# Patient Record
Sex: Male | Born: 1985 | Race: White | Hispanic: No | Marital: Single | Smoking: Current every day smoker
Health system: Southern US, Community
[De-identification: ages and names within clinical notes are randomized; demographics above are authoritative.]

## PROBLEM LIST (undated history)

## (undated) DIAGNOSIS — F1120 Opioid dependence, uncomplicated: Secondary | ICD-10-CM

## (undated) DIAGNOSIS — G8929 Other chronic pain: Secondary | ICD-10-CM

## (undated) DIAGNOSIS — R51 Headache: Secondary | ICD-10-CM

## (undated) DIAGNOSIS — F319 Bipolar disorder, unspecified: Secondary | ICD-10-CM

## (undated) DIAGNOSIS — F419 Anxiety disorder, unspecified: Secondary | ICD-10-CM

## (undated) DIAGNOSIS — R519 Headache, unspecified: Secondary | ICD-10-CM

## (undated) DIAGNOSIS — B182 Chronic viral hepatitis C: Secondary | ICD-10-CM

## (undated) DIAGNOSIS — Q631 Lobulated, fused and horseshoe kidney: Secondary | ICD-10-CM

## (undated) HISTORY — PX: TONSILLECTOMY: SUR1361

---

## 2010-04-16 ENCOUNTER — Emergency Department: Payer: Self-pay | Admitting: Emergency Medicine

## 2010-05-26 ENCOUNTER — Emergency Department: Payer: Self-pay | Admitting: Emergency Medicine

## 2010-08-04 ENCOUNTER — Inpatient Hospital Stay: Payer: Self-pay | Admitting: Psychiatry

## 2010-10-12 ENCOUNTER — Emergency Department: Payer: Self-pay | Admitting: Emergency Medicine

## 2010-11-14 ENCOUNTER — Emergency Department: Payer: Self-pay | Admitting: Emergency Medicine

## 2010-12-14 ENCOUNTER — Emergency Department: Payer: Self-pay | Admitting: Emergency Medicine

## 2010-12-21 ENCOUNTER — Emergency Department: Payer: Self-pay | Admitting: Emergency Medicine

## 2011-01-17 ENCOUNTER — Emergency Department: Payer: Self-pay | Admitting: Emergency Medicine

## 2011-03-29 ENCOUNTER — Emergency Department: Payer: Self-pay | Admitting: Emergency Medicine

## 2011-04-15 ENCOUNTER — Emergency Department: Payer: Self-pay | Admitting: Internal Medicine

## 2011-04-26 ENCOUNTER — Emergency Department: Payer: Self-pay | Admitting: Emergency Medicine

## 2011-04-30 ENCOUNTER — Emergency Department: Payer: Self-pay | Admitting: Emergency Medicine

## 2011-05-06 ENCOUNTER — Emergency Department: Payer: Self-pay | Admitting: Emergency Medicine

## 2011-05-08 ENCOUNTER — Emergency Department: Payer: Self-pay | Admitting: Emergency Medicine

## 2011-05-10 ENCOUNTER — Emergency Department: Payer: Self-pay | Admitting: Emergency Medicine

## 2011-05-11 ENCOUNTER — Emergency Department: Payer: Self-pay | Admitting: Emergency Medicine

## 2011-05-13 ENCOUNTER — Emergency Department: Payer: Self-pay | Admitting: Emergency Medicine

## 2011-05-19 ENCOUNTER — Inpatient Hospital Stay: Payer: Self-pay | Admitting: Psychiatry

## 2011-06-07 IMAGING — CT CT ABD-PELV W/ CM
1 of 3 series · 14 of 32 positions shown, 19 images · non-contrast
Comparison: none

REASON FOR EXAM: (1) pain; (2) pain
COMMENTS:

PROCEDURE:     CT  - CT ABDOMEN / PELVIS  W  - April 30, 2011  [DATE]
RESULT:     History: Pain.
Comparison study: Prior CT of 12/21/2010.

[Series 2: 3mm soft tissue · axial · 0.68mm/px · z∈[-540,-162]mm · 14 of 145 slices shown, 19 images]
[im 10/145  soft-tissue]
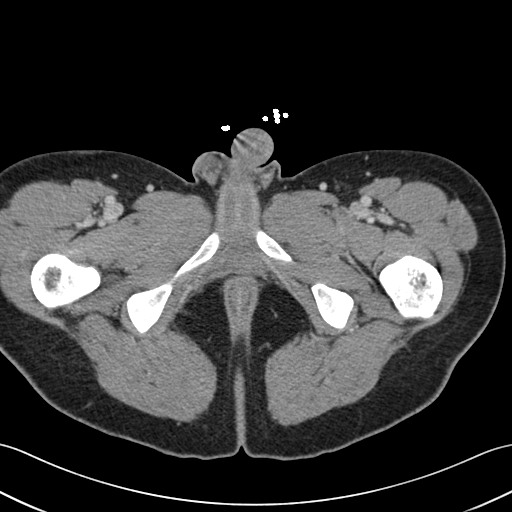
[im 10/145  bone]
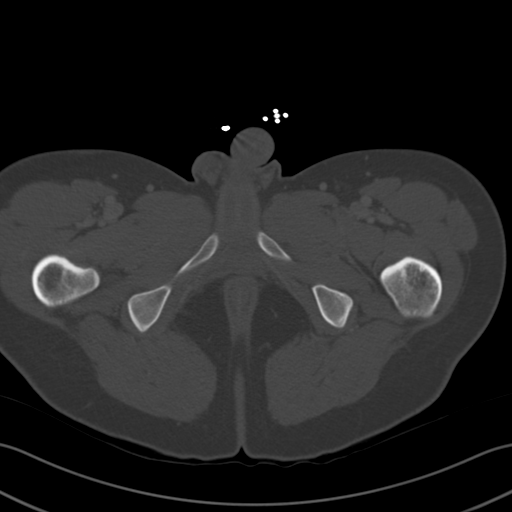
[im 19/145  soft-tissue]
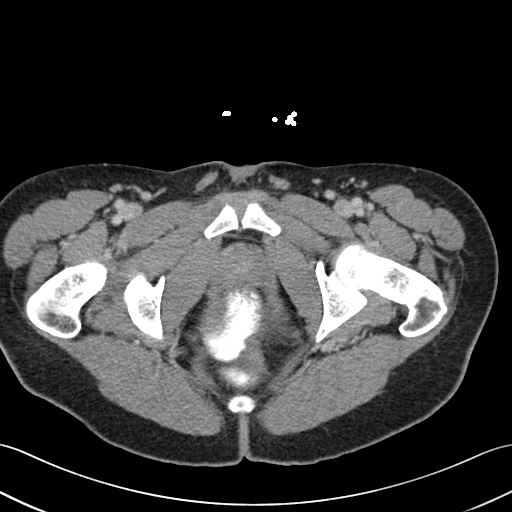
[im 28/145  soft-tissue]
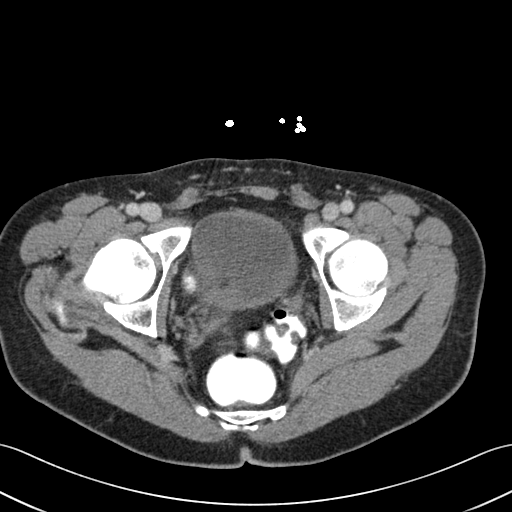
[im 46/145  soft-tissue]
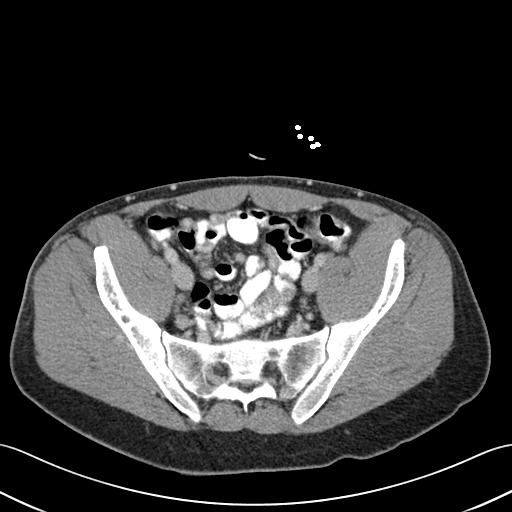
[im 55/145  soft-tissue]
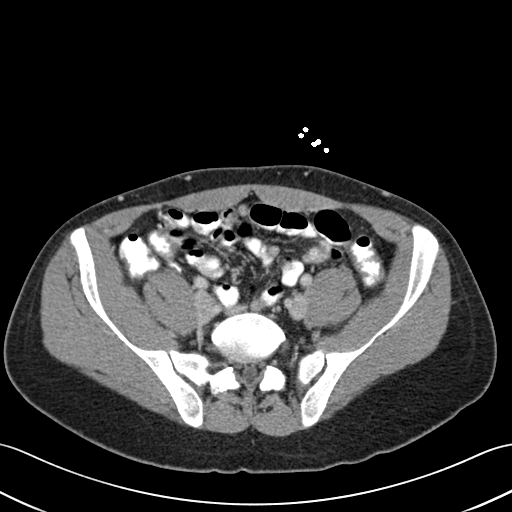
[im 64/145  soft-tissue]
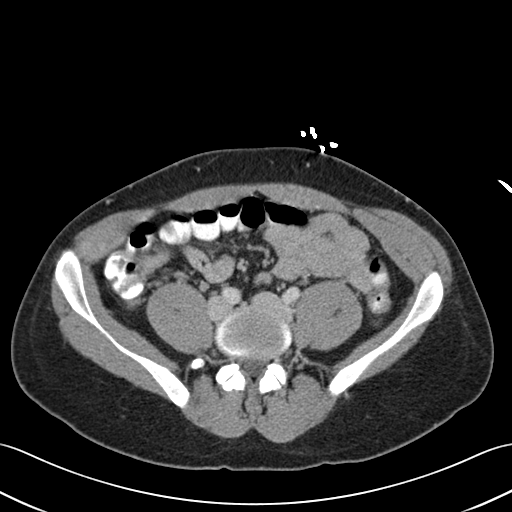
[im 73/145  soft-tissue]
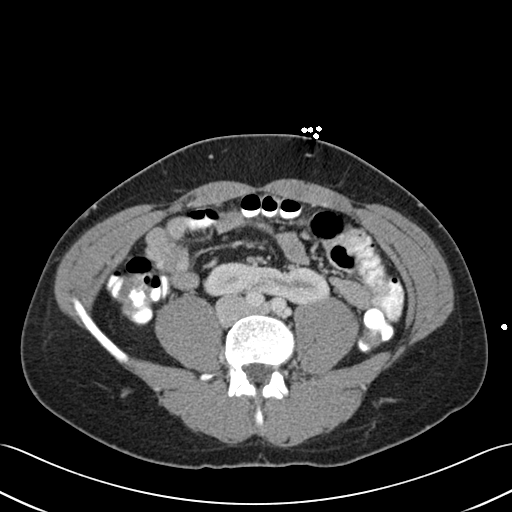
[im 82/145  soft-tissue]
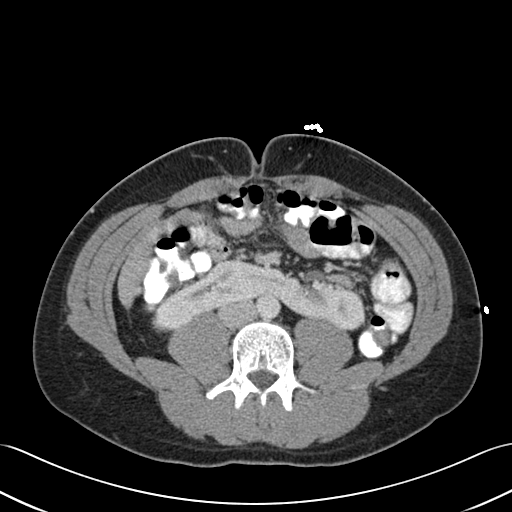
[im 91/145  soft-tissue]
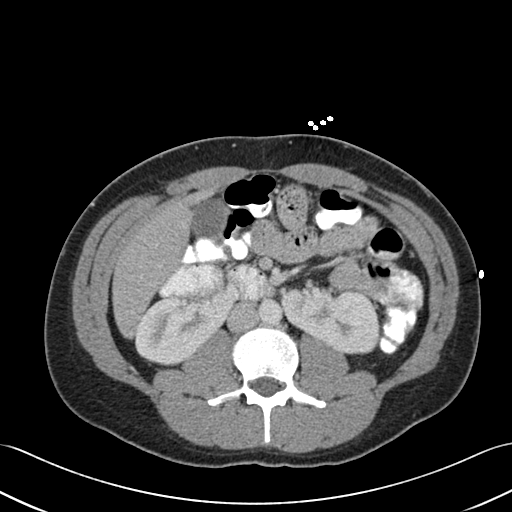
[im 91/145  bone]
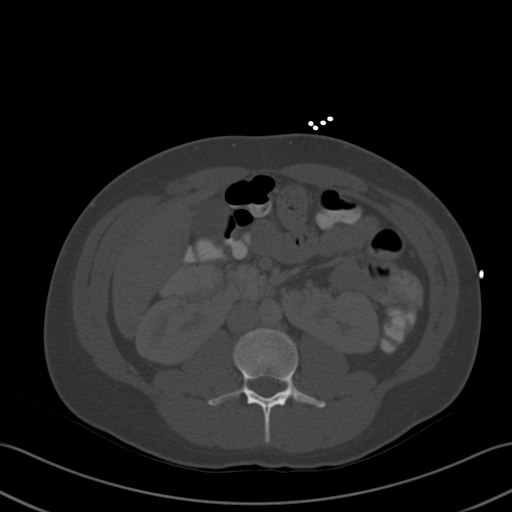
[im 100/145  soft-tissue]
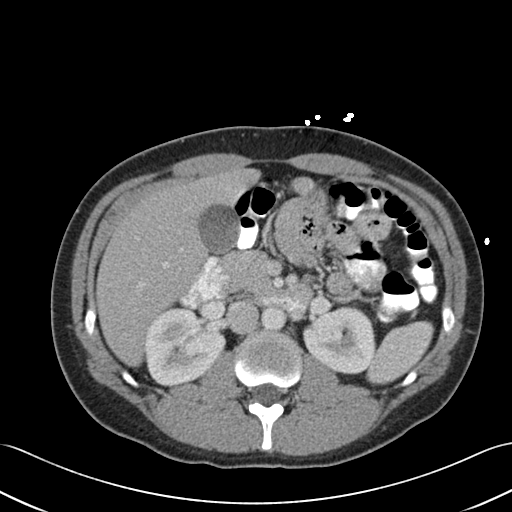
[im 109/145  lung]
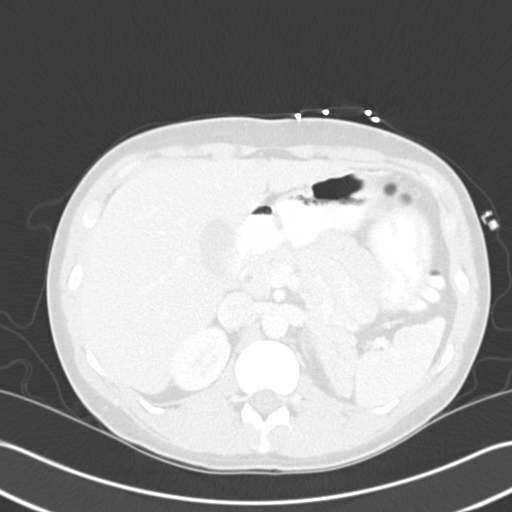
[im 118/145  soft-tissue]
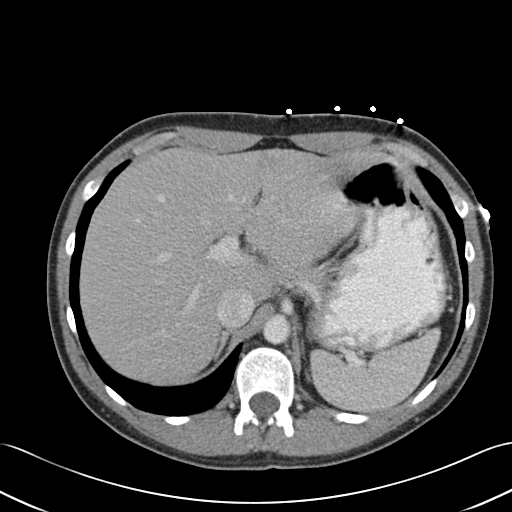
[im 118/145  lung]
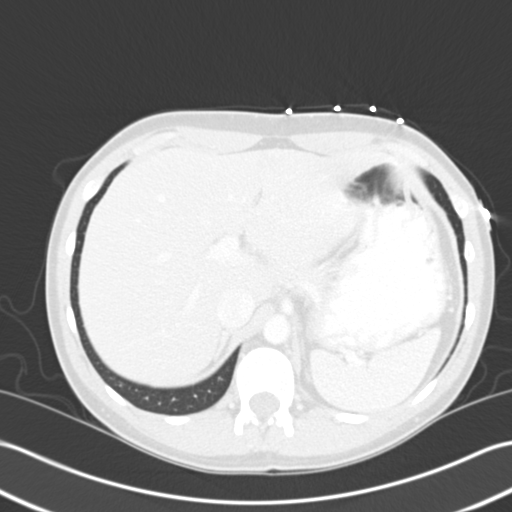
[im 127/145  soft-tissue]
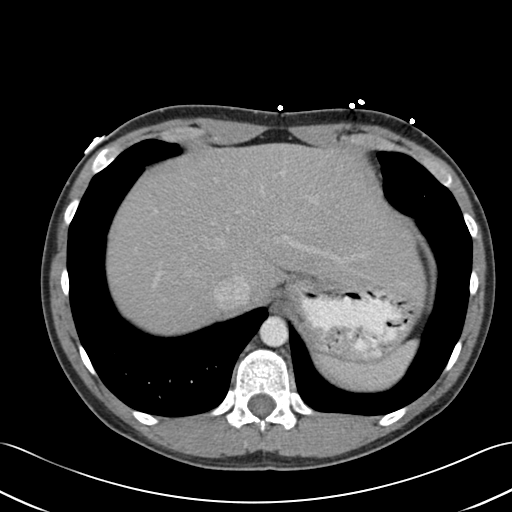
[im 127/145  lung]
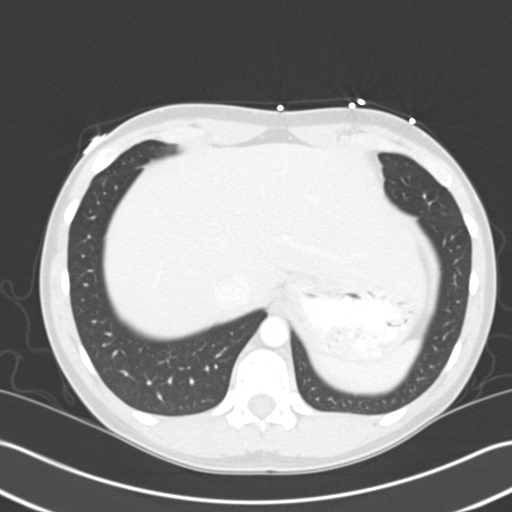
[im 136/145  soft-tissue]
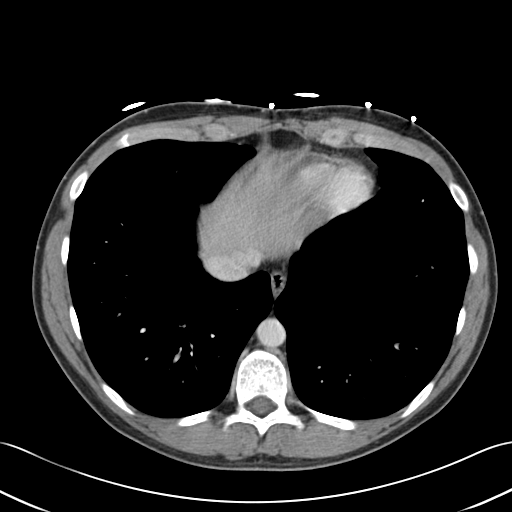
[im 136/145  lung]
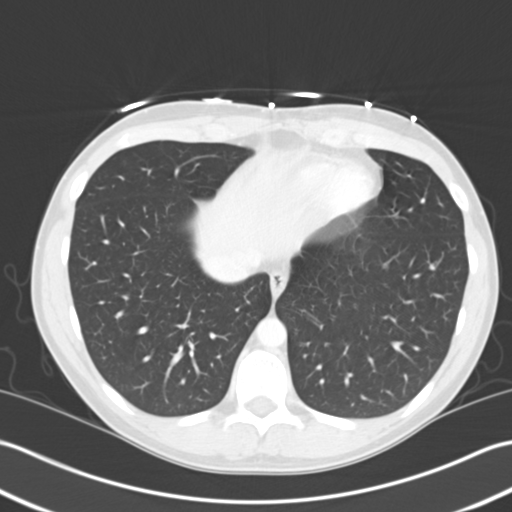

[14 of 32 positions shown; findings below may reference images not displayed]

FINDINGS: 
FINDINGS: Standard CT obtained with 100 cc of Ysovue-7PL. Liver normal.
Spleen is normal. Pancreas normal. Adrenals normal. Horseshoe kidney noted.
No focal renal lesions noted. No hydrocephalus. No bowel distention noted.
Small mesenteric lymph nodes are noted. These are stable. Appendix normal.
No evidence of bowel obstruction. Lung bases clear. The a tiny expansile
lesion in a right anterior rib is noted. Whole body bone scan is suggested
for further evaluation. No other bony abnormality identified. This is most
likely a benign bone lesion. Lung bases are clear. No free air.
IMPRESSION: Tiny expansile lesion in a right anterior rib. The patient
should be scheduled for bone scan as an outpatient for further evaluation.
This is most likely benign bone lesion.
2. No acute abnormality. Horseshoe kidney is noted.

## 2011-06-11 ENCOUNTER — Emergency Department: Payer: Self-pay | Admitting: Emergency Medicine

## 2011-06-12 ENCOUNTER — Emergency Department: Payer: Self-pay | Admitting: Emergency Medicine

## 2011-06-14 ENCOUNTER — Inpatient Hospital Stay: Payer: Self-pay | Admitting: Psychiatry

## 2011-06-17 DIAGNOSIS — R9431 Abnormal electrocardiogram [ECG] [EKG]: Secondary | ICD-10-CM

## 2011-06-17 DIAGNOSIS — R079 Chest pain, unspecified: Secondary | ICD-10-CM

## 2011-06-19 ENCOUNTER — Emergency Department: Payer: Self-pay | Admitting: Emergency Medicine

## 2011-06-21 ENCOUNTER — Emergency Department: Payer: Self-pay | Admitting: Emergency Medicine

## 2011-06-22 ENCOUNTER — Emergency Department: Payer: Self-pay | Admitting: Emergency Medicine

## 2011-07-19 IMAGING — CR DG CHEST 1V
1 series · 1 of 1 positions shown · non-contrast
Comparison: none

REASON FOR EXAM: left rib pain
COMMENTS:

PROCEDURE:     DXR - DXR CHEST 1 VIEWAP OR PA  - June 11, 2011  [DATE]
RESULT:     The cardiovascular structures are unremarkable. Chest is stable
from prior exam of 05/20/2011.

[view not recorded]
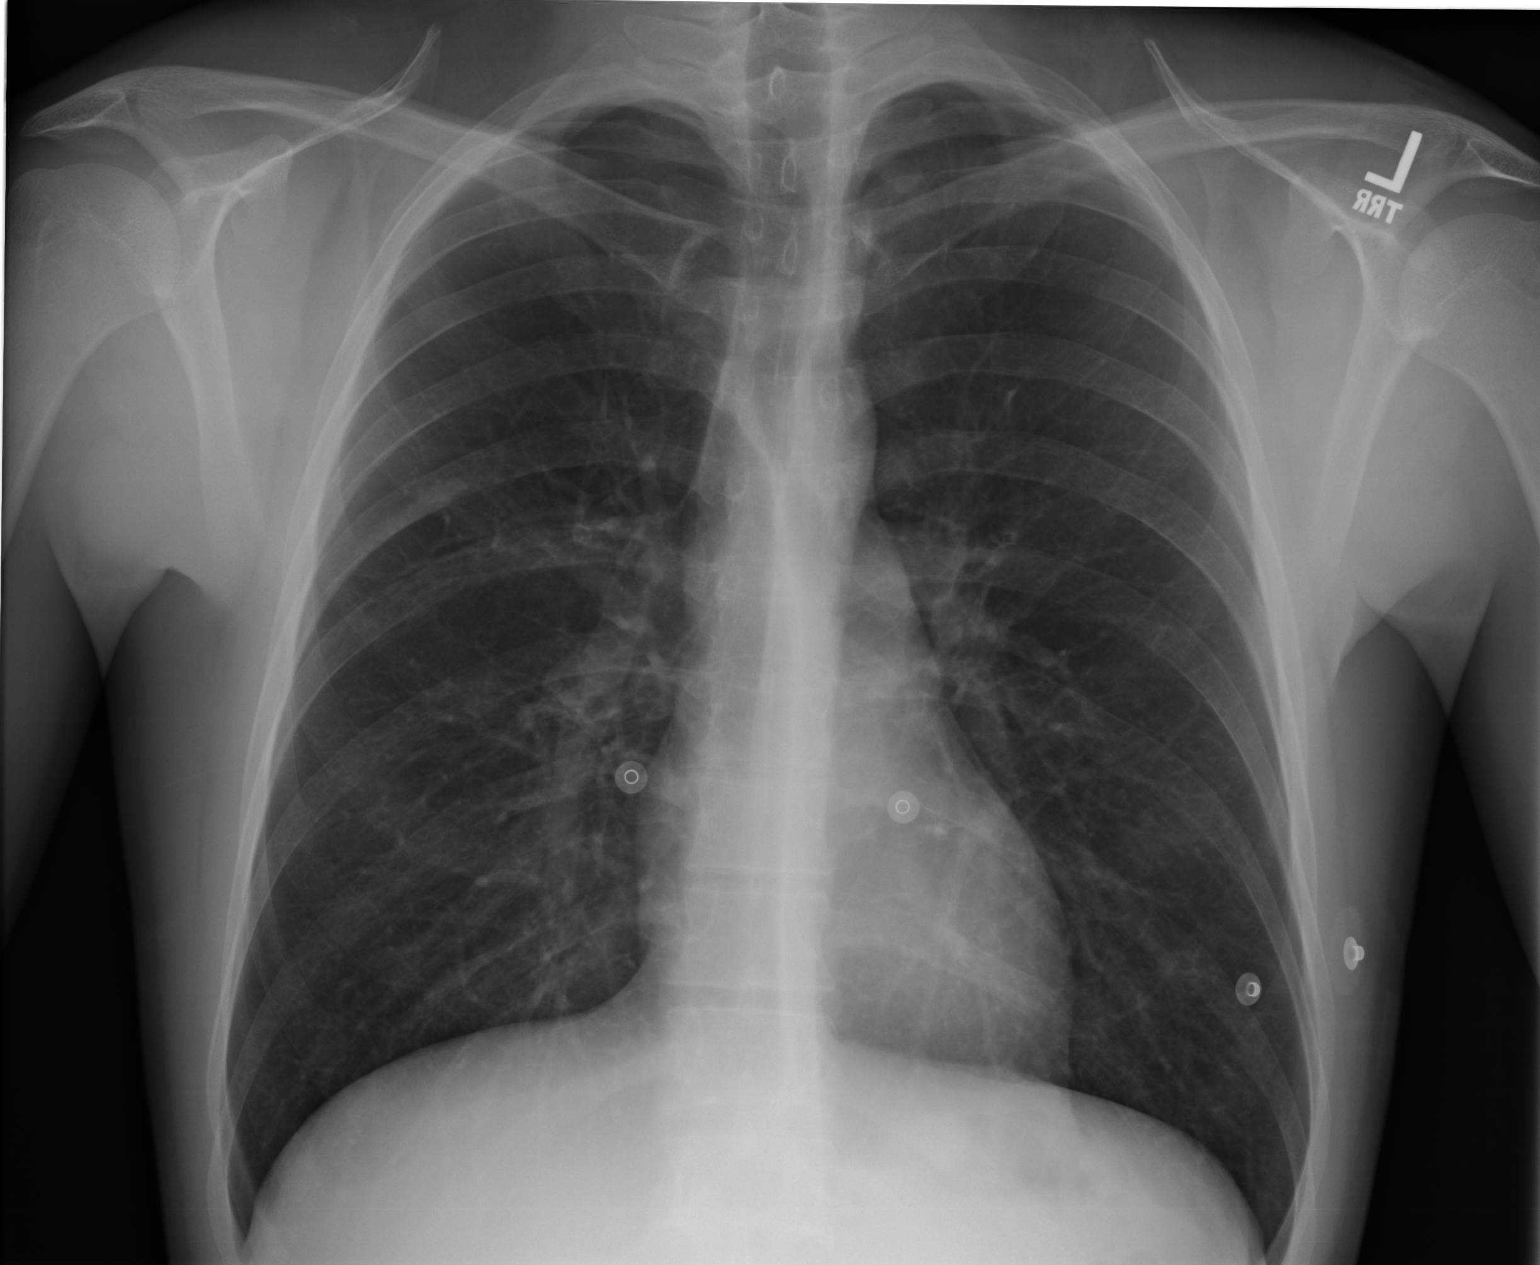

[1 of 1 positions shown; findings below may reference images not displayed]

IMPRESSION: 1. No acute abnormality.

## 2011-07-22 IMAGING — CR DG CHEST 2V
1 series · 2 of 2 positions shown · non-contrast
Comparison: none

REASON FOR EXAM: chest pain/cough
COMMENTS:

[Series 1: view not recorded · 0.17mm/px · 2 of 2 slices shown]
[im 1/2]
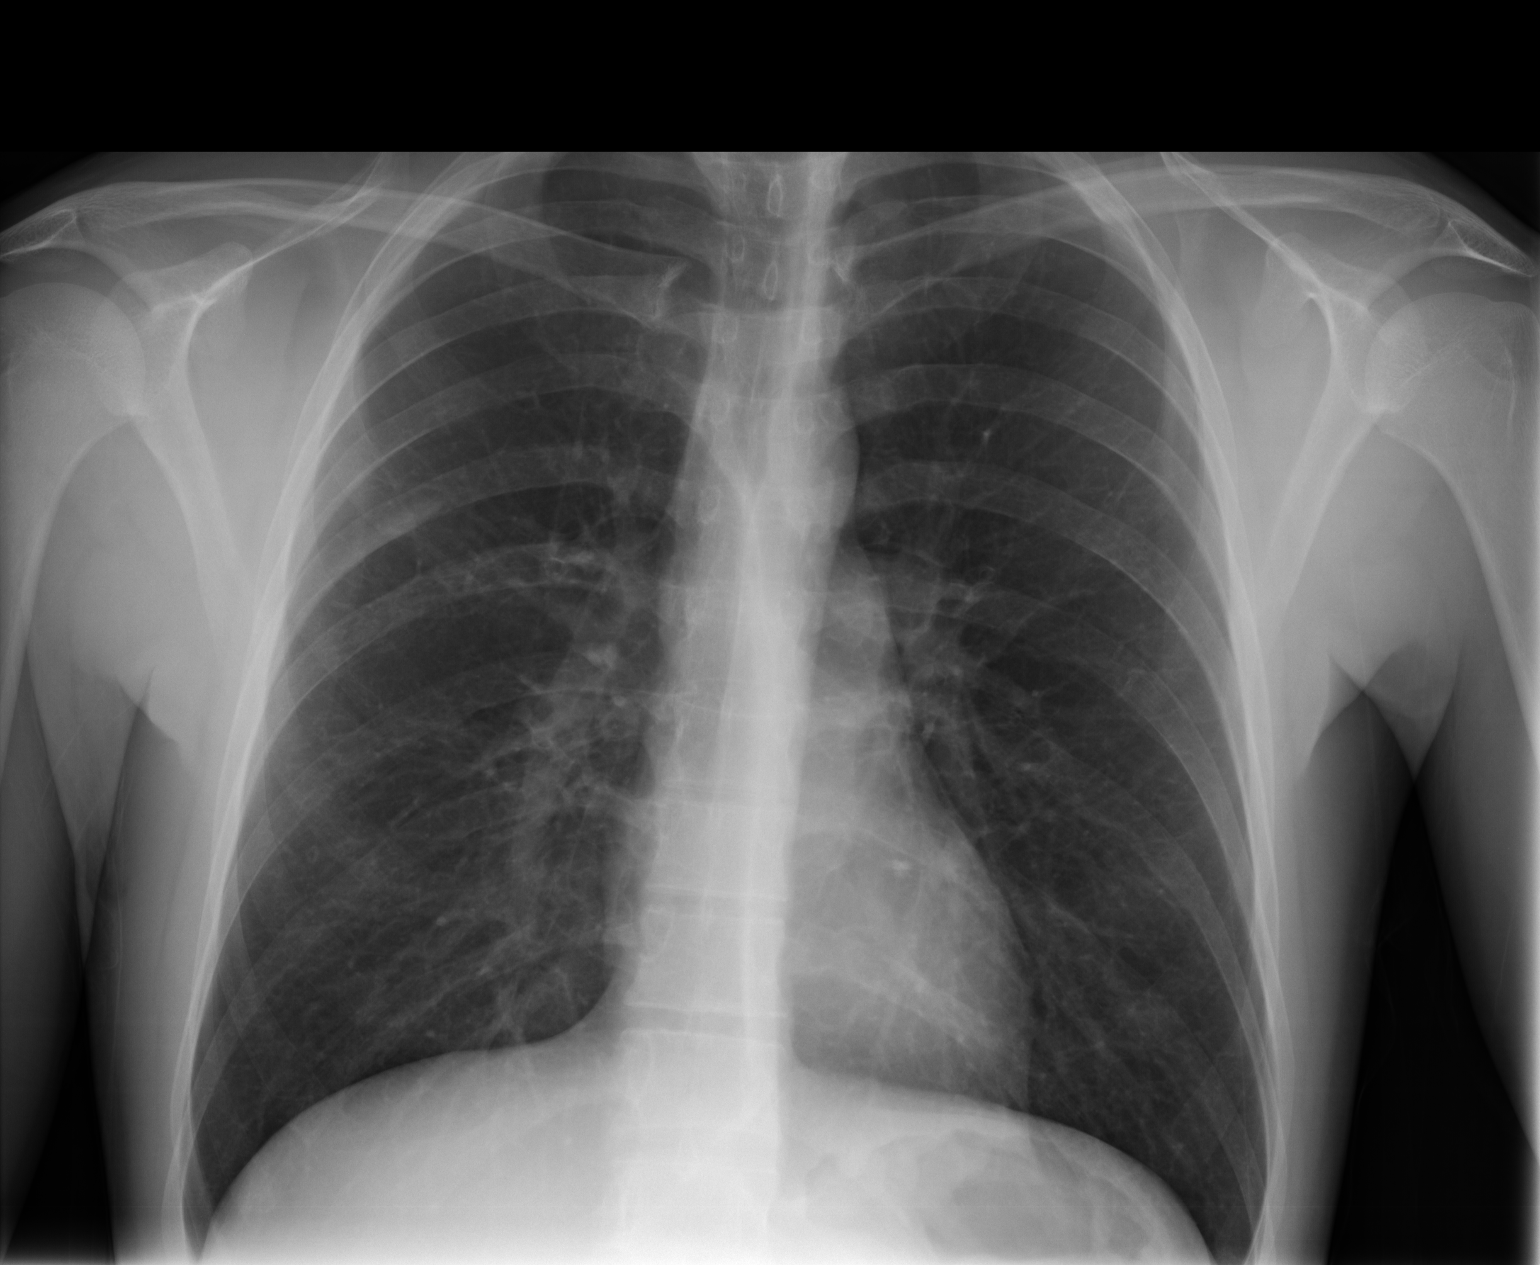
[im 2/2]
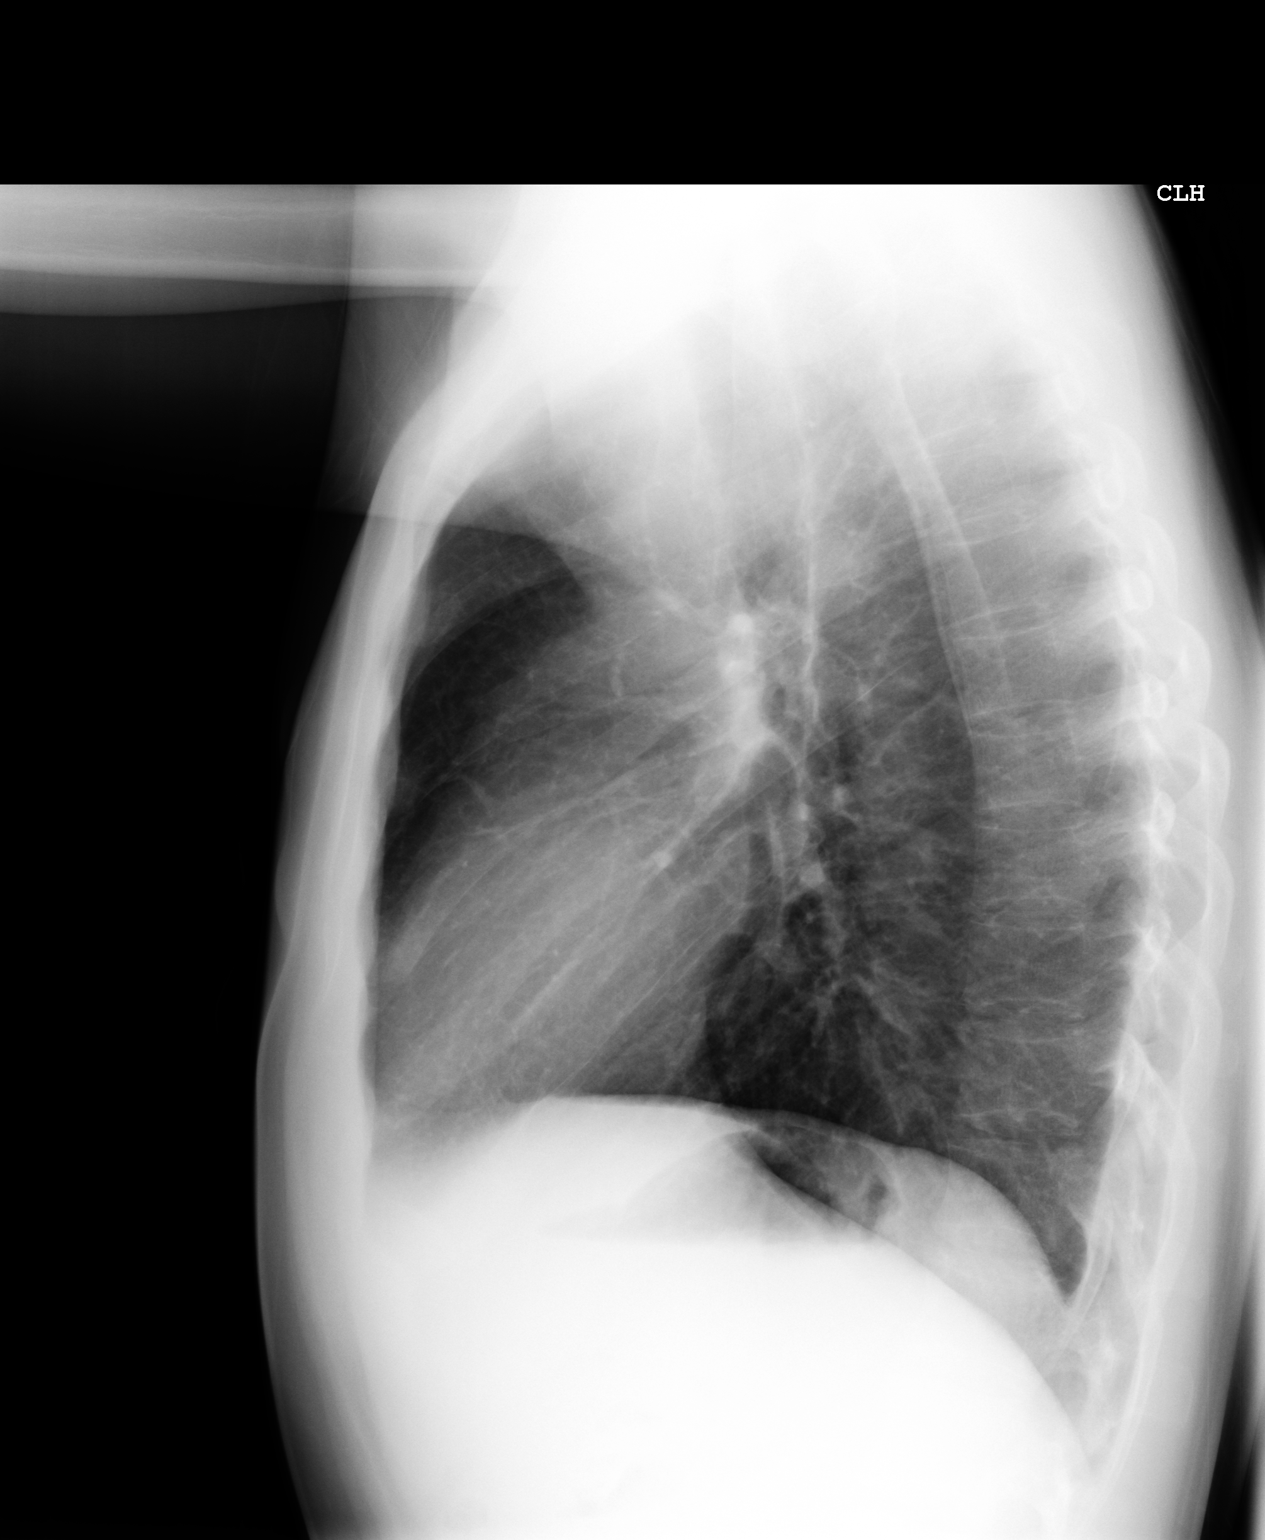

[2 of 2 positions shown; findings below may reference images not displayed]

PROCEDURE:     DXR - DXR CHEST PA (OR AP) AND LATERAL  - June 14, 2011  [DATE]

RESULT:     Comparison is made to a prior exam of 06/11/2011. The lung fields
are clear. No pneumonia, pneumothorax or pleural effusion is seen. Heart
size is normal. The chest appears mildly hyperinflated bilaterally. This can
be secondary to body habitus or to a history of reactive airway disease.
IMPRESSION: 1. The lung fields are clear.
2. Heart size is normal.
3. The chest appears bilaterally hyperinflated.

## 2011-08-08 ENCOUNTER — Emergency Department: Payer: Self-pay | Admitting: Emergency Medicine

## 2011-08-19 ENCOUNTER — Emergency Department: Payer: Self-pay | Admitting: *Deleted

## 2011-08-21 ENCOUNTER — Emergency Department: Payer: Self-pay | Admitting: Emergency Medicine

## 2011-10-23 ENCOUNTER — Inpatient Hospital Stay: Payer: Self-pay | Admitting: Psychiatry

## 2011-11-15 ENCOUNTER — Emergency Department: Payer: Self-pay | Admitting: Unknown Physician Specialty

## 2012-02-01 ENCOUNTER — Emergency Department: Payer: Self-pay | Admitting: Emergency Medicine

## 2012-02-01 LAB — CBC
HCT: 41.6 % (ref 40.0–52.0)
HGB: 14.4 g/dL (ref 13.0–18.0)
MCH: 31.4 pg (ref 26.0–34.0)
MCHC: 34.7 g/dL (ref 32.0–36.0)
MCV: 90 fL (ref 80–100)
RBC: 4.6 10*6/uL (ref 4.40–5.90)

## 2012-02-01 LAB — COMPREHENSIVE METABOLIC PANEL
Albumin: 4.1 g/dL (ref 3.4–5.0)
Anion Gap: 12 (ref 7–16)
Calcium, Total: 9.2 mg/dL (ref 8.5–10.1)
Co2: 23 mmol/L (ref 21–32)
EGFR (African American): >60
EGFR (Non-African Amer.): >60
Glucose: 104 mg/dL — ABNORMAL HIGH (ref 65–99)
Potassium: 3.3 mmol/L — ABNORMAL LOW (ref 3.5–5.1)
SGOT(AST): 78 U/L — ABNORMAL HIGH (ref 15–37)
Sodium: 140 mmol/L (ref 136–145)

## 2012-02-01 LAB — TSH: Thyroid Stimulating Horm: 2 u[IU]/mL

## 2012-02-01 LAB — ETHANOL: Ethanol %: <0.003 % (ref 0.000–0.080)

## 2012-02-01 LAB — DRUG SCREEN, URINE
Amphetamines, Ur Screen: NEGATIVE (ref ?–1000)
Benzodiazepine, Ur Scrn: NEGATIVE (ref ?–200)
Methadone, Ur Screen: NEGATIVE (ref ?–300)
Tricyclic, Ur Screen: NEGATIVE (ref ?–1000)

## 2012-02-01 LAB — ACETAMINOPHEN LEVEL: Acetaminophen: <2 ug/mL

## 2012-02-18 ENCOUNTER — Emergency Department: Payer: Self-pay | Admitting: Emergency Medicine

## 2012-02-19 ENCOUNTER — Emergency Department: Payer: Self-pay | Admitting: Emergency Medicine

## 2012-02-19 LAB — CBC WITH DIFFERENTIAL/PLATELET
Basophil #: 0.1 10*3/uL (ref 0.0–0.1)
Basophil #: 0 10*3/uL (ref 0.0–0.1)
Basophil %: 0.4 %
Eosinophil #: 0.5 10*3/uL (ref 0.0–0.7)
Eosinophil %: 3.2 %
HCT: 43.9 % (ref 40.0–52.0)
HGB: 15.2 g/dL (ref 13.0–18.0)
HGB: 14.6 g/dL (ref 13.0–18.0)
Lymphocyte #: 3.1 10*3/uL (ref 1.0–3.6)
Lymphocyte %: 29.9 %
Lymphocyte %: 22.2 %
MCH: 31.2 pg (ref 26.0–34.0)
MCHC: 33.4 g/dL (ref 32.0–36.0)
Monocyte #: 0.9 10*3/uL — ABNORMAL HIGH (ref 0.0–0.7)
Monocyte #: 1.1 10*3/uL — ABNORMAL HIGH (ref 0.0–0.7)
Neutrophil %: 57.8 %
Neutrophil %: 66.8 %
Platelet: 252 10*3/uL (ref 150–440)
Platelet: 270 10*3/uL (ref 150–440)
RBC: 4.76 10*6/uL (ref 4.40–5.90)
RDW: 16.8 % — ABNORMAL HIGH (ref 11.5–14.5)
RDW: 16.3 % — ABNORMAL HIGH (ref 11.5–14.5)
WBC: 14.1 10*3/uL — ABNORMAL HIGH (ref 3.8–10.6)

## 2012-02-19 LAB — COMPREHENSIVE METABOLIC PANEL
Albumin: 3 g/dL — ABNORMAL LOW (ref 3.4–5.0)
Anion Gap: 9 (ref 7–16)
BUN: 7 mg/dL (ref 7–18)
Bilirubin,Total: 1 mg/dL (ref 0.2–1.0)
Chloride: 107 mmol/L (ref 98–107)
Creatinine: 0.6 mg/dL (ref 0.60–1.30)
EGFR (African American): >60
EGFR (Non-African Amer.): >60
Glucose: 96 mg/dL (ref 65–99)
Potassium: 4.7 mmol/L (ref 3.5–5.1)
SGOT(AST): 98 U/L — ABNORMAL HIGH (ref 15–37)
Sodium: 141 mmol/L (ref 136–145)
Total Protein: 7 g/dL (ref 6.4–8.2)

## 2012-02-19 LAB — BASIC METABOLIC PANEL
Calcium, Total: 8.7 mg/dL (ref 8.5–10.1)
Chloride: 106 mmol/L (ref 98–107)
Co2: 26 mmol/L (ref 21–32)
EGFR (African American): >60
EGFR (Non-African Amer.): >60
Osmolality: 289 (ref 275–301)
Potassium: 4.1 mmol/L (ref 3.5–5.1)
Sodium: 144 mmol/L (ref 136–145)

## 2012-02-20 LAB — AMYLASE: Amylase: 26 U/L (ref 25–115)

## 2012-02-20 LAB — DRUG SCREEN, URINE
Amphetamines, Ur Screen: NEGATIVE (ref ?–1000)
MDMA (Ecstasy)Ur Screen: NEGATIVE (ref ?–500)
Methadone, Ur Screen: NEGATIVE (ref ?–300)
Opiate, Ur Screen: POSITIVE (ref ?–300)
Phencyclidine (PCP) Ur S: NEGATIVE (ref ?–25)
Tricyclic, Ur Screen: NEGATIVE (ref ?–1000)

## 2012-02-20 LAB — LIPASE, BLOOD: Lipase: 44 U/L — ABNORMAL LOW (ref 73–393)

## 2012-02-20 LAB — ETHANOL: Ethanol: <3 mg/dL

## 2012-02-24 LAB — CULTURE, BLOOD (SINGLE)

## 2012-02-25 LAB — CULTURE, BLOOD (SINGLE)

## 2012-04-15 ENCOUNTER — Emergency Department: Payer: Self-pay | Admitting: Emergency Medicine

## 2012-04-15 LAB — COMPREHENSIVE METABOLIC PANEL
Albumin: 4 g/dL (ref 3.4–5.0)
Alkaline Phosphatase: 96 U/L (ref 50–136)
Anion Gap: 9 (ref 7–16)
BUN: 6 mg/dL — ABNORMAL LOW (ref 7–18)
Bilirubin,Total: 0.2 mg/dL (ref 0.2–1.0)
Calcium, Total: 8.8 mg/dL (ref 8.5–10.1)
Chloride: 106 mmol/L (ref 98–107)
EGFR (African American): >60
EGFR (Non-African Amer.): >60
Glucose: 86 mg/dL (ref 65–99)

## 2012-04-15 LAB — CBC
HCT: 46.7 % (ref 40.0–52.0)
MCHC: 33.3 g/dL (ref 32.0–36.0)
MCV: 93 fL (ref 80–100)
Platelet: 340 10*3/uL (ref 150–440)
WBC: 6.7 10*3/uL (ref 3.8–10.6)

## 2012-04-16 LAB — DRUG SCREEN, URINE
Amphetamines, Ur Screen: NEGATIVE (ref ?–1000)
Cannabinoid 50 Ng, Ur ~~LOC~~: POSITIVE (ref ?–50)
Cocaine Metabolite,Ur ~~LOC~~: NEGATIVE (ref ?–300)
MDMA (Ecstasy)Ur Screen: NEGATIVE (ref ?–500)
Opiate, Ur Screen: NEGATIVE (ref ?–300)

## 2012-04-26 DIAGNOSIS — F1411 Cocaine abuse, in remission: Secondary | ICD-10-CM | POA: Insufficient documentation

## 2012-06-02 LAB — DRUG SCREEN, URINE
Benzodiazepine, Ur Scrn: POSITIVE (ref ?–200)
Cannabinoid 50 Ng, Ur ~~LOC~~: POSITIVE (ref ?–50)
Opiate, Ur Screen: NEGATIVE (ref ?–300)
Phencyclidine (PCP) Ur S: NEGATIVE (ref ?–25)

## 2012-06-02 LAB — ETHANOL
Ethanol %: 0.035 % (ref 0.000–0.080)
Ethanol: 35 mg/dL

## 2012-06-02 LAB — CBC
HGB: 15.3 g/dL (ref 13.0–18.0)
MCH: 30.6 pg (ref 26.0–34.0)
MCHC: 32.9 g/dL (ref 32.0–36.0)
MCV: 93 fL (ref 80–100)
Platelet: 290 10*3/uL (ref 150–440)
RBC: 4.98 10*6/uL (ref 4.40–5.90)
RDW: 13.7 % (ref 11.5–14.5)
WBC: 9.2 10*3/uL (ref 3.8–10.6)

## 2012-06-02 LAB — URINALYSIS, COMPLETE
Bacteria: NONE SEEN
Leukocyte Esterase: NEGATIVE
Nitrite: NEGATIVE
Protein: NEGATIVE
RBC,UR: NONE SEEN /HPF (ref 0–5)
Specific Gravity: 1.006 (ref 1.003–1.030)

## 2012-06-02 LAB — COMPREHENSIVE METABOLIC PANEL
Anion Gap: 9 (ref 7–16)
BUN: 5 mg/dL — ABNORMAL LOW (ref 7–18)
Bilirubin,Total: 0.2 mg/dL (ref 0.2–1.0)
Chloride: 104 mmol/L (ref 98–107)
Creatinine: 0.96 mg/dL (ref 0.60–1.30)
EGFR (Non-African Amer.): >60
Osmolality: 272 (ref 275–301)
SGPT (ALT): 22 U/L

## 2012-06-02 LAB — TSH: Thyroid Stimulating Horm: 1.39 u[IU]/mL

## 2012-06-03 ENCOUNTER — Inpatient Hospital Stay: Payer: Self-pay | Admitting: Psychiatry

## 2012-10-26 ENCOUNTER — Emergency Department: Payer: Self-pay | Admitting: Emergency Medicine

## 2012-10-26 LAB — CBC
MCH: 31 pg (ref 26.0–34.0)
MCHC: 33.8 g/dL (ref 32.0–36.0)
MCV: 92 fL (ref 80–100)
Platelet: 225 10*3/uL (ref 150–440)
RBC: 4.39 10*6/uL — ABNORMAL LOW (ref 4.40–5.90)
RDW: 14.3 % (ref 11.5–14.5)
WBC: 5.6 10*3/uL (ref 3.8–10.6)

## 2012-10-26 LAB — DRUG SCREEN, URINE
Benzodiazepine, Ur Scrn: POSITIVE (ref ?–200)
Cannabinoid 50 Ng, Ur ~~LOC~~: NEGATIVE (ref ?–50)
Cocaine Metabolite,Ur ~~LOC~~: POSITIVE (ref ?–300)
MDMA (Ecstasy)Ur Screen: NEGATIVE (ref ?–500)
Methadone, Ur Screen: NEGATIVE (ref ?–300)
Phencyclidine (PCP) Ur S: NEGATIVE (ref ?–25)

## 2012-10-26 LAB — URINALYSIS, COMPLETE
Bacteria: NONE SEEN
Blood: NEGATIVE
Glucose,UR: 50 mg/dL (ref 0–75)
Leukocyte Esterase: NEGATIVE
Nitrite: NEGATIVE
RBC,UR: 1 /HPF (ref 0–5)
Squamous Epithelial: NONE SEEN
WBC UR: 3 /HPF (ref 0–5)

## 2012-10-27 LAB — COMPREHENSIVE METABOLIC PANEL
Anion Gap: 9 (ref 7–16)
BUN: 11 mg/dL (ref 7–18)
Bilirubin,Total: 0.4 mg/dL (ref 0.2–1.0)
Calcium, Total: 8.3 mg/dL — ABNORMAL LOW (ref 8.5–10.1)
Chloride: 110 mmol/L — ABNORMAL HIGH (ref 98–107)
Creatinine: 0.62 mg/dL (ref 0.60–1.30)
EGFR (African American): >60
Glucose: 87 mg/dL (ref 65–99)
Potassium: 3.6 mmol/L (ref 3.5–5.1)
Sodium: 144 mmol/L (ref 136–145)

## 2012-10-27 LAB — TSH: Thyroid Stimulating Horm: 0.29 u[IU]/mL — ABNORMAL LOW

## 2012-10-27 LAB — ETHANOL: Ethanol: <3 mg/dL

## 2012-11-19 ENCOUNTER — Emergency Department: Payer: Self-pay | Admitting: Emergency Medicine

## 2012-11-19 LAB — COMPREHENSIVE METABOLIC PANEL
Alkaline Phosphatase: 98 U/L (ref 50–136)
Bilirubin,Total: 0.3 mg/dL (ref 0.2–1.0)
Co2: 27 mmol/L (ref 21–32)
Creatinine: 0.65 mg/dL (ref 0.60–1.30)
EGFR (African American): >60
EGFR (Non-African Amer.): >60
Glucose: 83 mg/dL (ref 65–99)
Osmolality: 274 (ref 275–301)
SGOT(AST): 25 U/L (ref 15–37)
SGPT (ALT): 28 U/L (ref 12–78)
Sodium: 139 mmol/L (ref 136–145)

## 2012-11-19 LAB — CBC
HGB: 16.3 g/dL (ref 13.0–18.0)
MCH: 32.1 pg (ref 26.0–34.0)
MCHC: 35.4 g/dL (ref 32.0–36.0)

## 2012-11-19 LAB — SALICYLATE LEVEL: Salicylates, Serum: 8.4 mg/dL — ABNORMAL HIGH

## 2012-11-19 LAB — ETHANOL
Ethanol %: <0.003 % (ref 0.000–0.080)
Ethanol: <3 mg/dL

## 2012-11-20 LAB — DRUG SCREEN, URINE
Amphetamines, Ur Screen: NEGATIVE (ref ?–1000)
Cocaine Metabolite,Ur ~~LOC~~: POSITIVE (ref ?–300)
Methadone, Ur Screen: NEGATIVE (ref ?–300)
Opiate, Ur Screen: NEGATIVE (ref ?–300)
Phencyclidine (PCP) Ur S: NEGATIVE (ref ?–25)
Tricyclic, Ur Screen: NEGATIVE (ref ?–1000)

## 2012-11-20 LAB — URINALYSIS, COMPLETE
Bilirubin,UR: NEGATIVE
Blood: NEGATIVE
Glucose,UR: NEGATIVE mg/dL (ref 0–75)
Leukocyte Esterase: NEGATIVE
Nitrite: NEGATIVE
RBC,UR: <1 /HPF (ref 0–5)

## 2013-06-27 ENCOUNTER — Inpatient Hospital Stay: Payer: Self-pay | Admitting: Internal Medicine

## 2013-06-27 LAB — COMPREHENSIVE METABOLIC PANEL
Alkaline Phosphatase: 96 U/L (ref 50–136)
Anion Gap: 8 (ref 7–16)
Bilirubin,Total: 0.3 mg/dL (ref 0.2–1.0)
Calcium, Total: 9.1 mg/dL (ref 8.5–10.1)
Chloride: 104 mmol/L (ref 98–107)
Co2: 25 mmol/L (ref 21–32)
Creatinine: 0.93 mg/dL (ref 0.60–1.30)
EGFR (Non-African Amer.): >60
Glucose: 87 mg/dL (ref 65–99)
Osmolality: 273 (ref 275–301)
Potassium: 3.5 mmol/L (ref 3.5–5.1)
SGOT(AST): 35 U/L (ref 15–37)
Sodium: 137 mmol/L (ref 136–145)
Total Protein: 8.4 g/dL — ABNORMAL HIGH (ref 6.4–8.2)

## 2013-06-27 LAB — CBC WITH DIFFERENTIAL/PLATELET
Basophil #: 0.1 10*3/uL (ref 0.0–0.1)
Eosinophil %: 1.8 %
HCT: 44.1 % (ref 40.0–52.0)
HGB: 14.7 g/dL (ref 13.0–18.0)
MCHC: 33.3 g/dL (ref 32.0–36.0)
MCV: 92 fL (ref 80–100)
Monocyte #: 1.3 x10 3/mm — ABNORMAL HIGH (ref 0.2–1.0)
Monocyte %: 8 %
Neutrophil #: 11.4 10*3/uL — ABNORMAL HIGH (ref 1.4–6.5)
Neutrophil %: 71.4 %
RDW: 14.1 % (ref 11.5–14.5)
WBC: 16 10*3/uL — ABNORMAL HIGH (ref 3.8–10.6)

## 2013-07-04 LAB — WOUND CULTURE

## 2013-09-27 ENCOUNTER — Emergency Department: Payer: Self-pay | Admitting: Emergency Medicine

## 2014-05-07 ENCOUNTER — Emergency Department: Payer: Self-pay | Admitting: Internal Medicine

## 2014-05-07 LAB — URINALYSIS, COMPLETE
Bacteria: NONE SEEN
Bilirubin,UR: NEGATIVE
Blood: NEGATIVE
Glucose,UR: NEGATIVE mg/dL (ref 0–75)
Ketone: NEGATIVE
Leukocyte Esterase: NEGATIVE
Nitrite: NEGATIVE
PROTEIN: NEGATIVE
Ph: 5 (ref 4.5–8.0)
RBC,UR: NONE SEEN /HPF (ref 0–5)
SQUAMOUS EPITHELIAL: NONE SEEN
Specific Gravity: 1.019 (ref 1.003–1.030)

## 2014-06-22 ENCOUNTER — Emergency Department: Payer: Self-pay | Admitting: Emergency Medicine

## 2014-08-15 ENCOUNTER — Emergency Department: Payer: Self-pay | Admitting: Emergency Medicine

## 2014-08-15 LAB — URINALYSIS, COMPLETE
BLOOD: NEGATIVE
Bacteria: NONE SEEN
Bilirubin,UR: NEGATIVE
GLUCOSE, UR: NEGATIVE mg/dL (ref 0–75)
KETONE: NEGATIVE
Leukocyte Esterase: NEGATIVE
NITRITE: NEGATIVE
PH: 5 (ref 4.5–8.0)
PROTEIN: NEGATIVE
Specific Gravity: 1.017 (ref 1.003–1.030)
Squamous Epithelial: <1

## 2014-08-15 LAB — DRUG SCREEN, URINE
AMPHETAMINES, UR SCREEN: NEGATIVE (ref ?–1000)
BENZODIAZEPINE, UR SCRN: NEGATIVE (ref ?–200)
Barbiturates, Ur Screen: NEGATIVE (ref ?–200)
CANNABINOID 50 NG, UR ~~LOC~~: POSITIVE (ref ?–50)
COCAINE METABOLITE, UR ~~LOC~~: NEGATIVE (ref ?–300)
MDMA (Ecstasy)Ur Screen: NEGATIVE (ref ?–500)
Methadone, Ur Screen: NEGATIVE (ref ?–300)
Opiate, Ur Screen: NEGATIVE (ref ?–300)
PHENCYCLIDINE (PCP) UR S: NEGATIVE (ref ?–25)
Tricyclic, Ur Screen: NEGATIVE (ref ?–1000)

## 2014-08-15 LAB — GC/CHLAMYDIA PROBE AMP

## 2015-01-01 ENCOUNTER — Emergency Department: Payer: Self-pay | Admitting: Student

## 2015-04-08 NOTE — Consult Note (Signed)
PATIENT NAME:  Nathan [name], Nathan [name] MR#:  [id number] DATE OF BIRTH:  1986/01/22  DATE OF CONSULTATION:  11/20/2012  REFERRING PHYSICIAN:  Dr. Janalyn Harderavid Kaminski  CONSULTING PHYSICIAN:  Suprina Mandeville B. Bexleigh Theriault, MD  REASON FOR CONSULTATION: To evaluate a patient after overdose.   IDENTIFYING DATA: [name] is a 29 year old male with history of mood instability and substance abuse.   CHIEF COMPLAINT: "I need help."   HISTORY OF PRESENT ILLNESS: [name] is well known to us. He has been hospitalized [facility]. He also frequency returns to Emergency Room following drug binges and suicidal threats. He has been through several treatment programs including court ordered program. He struggles with substance abuse and has not been able to maintain sobriety. He was brought to the Emergency Room after medication overdose while intoxicated. Reportedly while with his brother he took a handful of pills. He believed that the pills including lithium among others. Fortunately, his lithium level was low and is unlikely that the patient overdosed on lithium. He is not currently prescribed lithium and had it from his old supply. The patient presents often with a similar scenario when he uses drugs in the company of his brother or father, oftentimes becomes depressed and suicidal. He has been a patient of [facility] at [facility] who prescribes just Seroquel at night along with clonazepam 1 mg at night as well. The patient reports many symptoms of depression with poor sleep, decreased appetite, anhedonia, crying, feeling of guilt, hopelessness, worthlessness, some weight loss, poor energy, concentration, and passing suicidal thoughts. He realizes that substance abuse has been out of control and is asking to be referred to a residential substance abuse treatment program.   PAST PSYCHIATRIC HISTORY: The patient has substance abuse problem and mood instability. He has been treated with multiple  medications. He did well on Zyprexa while in [place]. He has been on lithium and Depakote and multiple antipsychotics and antidepressants. He now believes that substance abuse is his primary problem and agrees with Dr. Mervyn SkeetersA that low dose Seroquel for sleep and anxiety is the best solution to his problems. There is a history of treatment noncompliance. He has been addicted to multiple substances and does not discriminate. He reports that lately he has been doing a lot of heroin, last dose two days ago, so far there are no withdrawal symptoms.   FAMILY PSYCHIATRIC HISTORY: There are multiple family members including his brother and father with mental illness and substance abuse problems. No history of completed suicides in the family.   PAST MEDICAL HISTORY: Hepatitis C.    ALLERGIES: Aspirin, Geodon, ibuprofen, Naprosyn.   MEDICATIONS ON ADMISSION:  1. Seroquel 100 mg at bedtime. 2. Clonazepam 1 mg at bedtime.   SOCIAL HISTORY: He is originally from [place]. His parents are divorced. He lives with his mother who is very supportive, nice and kind. He hangs out with his brother and father who oftentimes use substances. He has been hospitalized many times for mood instability as well as substance abuse. He has two children, has never been married. He is unable to pay child support.   REVIEW OF SYSTEMS: CONSTITUTIONAL: No fevers or chills. Positive for some weight loss. EYES: No double or blurred vision. ENT: No hearing loss. RESPIRATORY: No shortness of breath or cough. CARDIOVASCULAR: No chest pain or orthopnea. GASTROINTESTINAL: No abdominal pain, nausea, vomiting, or diarrhea. GENITOURINARY: No incontinence or frequency. ENDOCRINE: No heat or cold intolerance. LYMPHATIC: No anemia or easy bruising.  INTEGUMENTARY: No acne or rash. MUSCULOSKELETAL: No muscle or joint pain. NEUROLOGIC: No tingling or weakness. PSYCHIATRIC: See history of present illness for details.   PHYSICAL EXAMINATION:  VITAL  SIGNS: Blood pressure 105/62, pulse 61, respirations 20, temperature 98.3.   GENERAL: This is a slender young male in no acute distress. The rest of the physical examination is deferred to his primary attending.   LABORATORY, DIAGNOSTIC AND RADIOLOGICAL DATA: Chemistries are within normal limits. Blood alcohol level zero. LFTs within normal limits. Lithium less than 0.1. Urine drug screen positive for benzodiazepines, cocaine, cannabinoids and MDMA. CBC within normal limits. Urinalysis is not suggestive of urinary tract infection. Serum acetaminophen less than 2. Serum salicylates 8.4. EKG: Normal sinus rhythm, normal EKG.   MENTAL STATUS EXAMINATION: The patient is alert and oriented to person, place, time, and situation. He is unkept, looks dirty. He recognizes me from previous encounters. He is pleasant, polite, and cooperative. He maintains good eye contact. His speech is soft. Mood is depressed with flat affect. Thought processing is logical and goal oriented. He denies suicidal or homicidal ideation but was admitted after a suicide attempt by pill overdose. There are no delusions or paranoia. No auditory or visual hallucinations. His cognition is grossly intact. He registers three out of three and recalls three out of three objects after five minutes. He can spell world forwards and backwards. He knows current president. His insight and judgment are questionable.   SUICIDE RISK ASSESSMENT: This is a patient with long history of mood instability, substance abuse and multiple suicide attempts/gestures who came to the hospital after another suicide attempt by overdose in the context of relapse on substances.   DIAGNOSES:  AXIS I:  1. Mood disorder, not otherwise specified.  2. Polysubstance dependence.   AXIS II: Deferred.   AXIS III: Hepatitis C.   AXIS IV: Mental illness, substance abuse, employment, financial, access to care.   AXIS V: GAF 25.   PLAN:  1. The patient was referred to  [facility] rehab facility and will await bed opening in the Emergency Room which will speed up the process.  2. I will restart Seroquel 100 mg at night as prescribed by Dr. Mervyn Skeeters at [facility].  3. The patient does not need alcohol detox.  4. He may need symptomatic treatment for opiate withdrawal. His last dose of heroin was two days ago.      5. I will follow along.  ____________________________ Ellin Goodie. Jennet Maduro, MD jbp:cms D: 11/21/2012 16:45:55 ET T: 11/21/2012 17:13:49 ET JOB#: [id number]  cc: Areej Tayler B. Jennet Maduro, MD, <Dictator> Shari Prows MD ELECTRONICALLY SIGNED 12/02/2012 2:16

## 2015-04-08 NOTE — Consult Note (Signed)
Brief Consult Note: Diagnosis: Mood disorder NOS, Polysubstance dependence.   Patient was seen by consultant.   Consult note dictated.   Recommend further assessment or treatment.   Orders entered.   Comments: [name] has a h/o mood instability and substance dependence. He was brought to ER after an overdose on a handful of medic ations while intoxicated. He still feels hopeless and wants substance abuse treatyment.   PLAN: 1. The patient is referred to [facility] rehab facility.   2. Will restart Sertoquel as prescribed by Dr[facility]A at [facility].   3. He does not need alcohol detox.  4. Last dose heroine was 2 days ago. He is not in withdrawal yet.   5. I will follow along.  Electronic Signatures: Kristine LineaPucilowska, Bubba Vanbenschoten (MD)  (Signed 02-Dec-13 14:16)  Authored: Brief Consult Note   Last Updated: 02-Dec-13 14:16 by Kristine LineaPucilowska, Benino Korinek (MD)

## 2015-04-13 NOTE — H&P (Signed)
PATIENT NAME:  Nathan [name], CICERO MR#:  [id number] DATE OF BIRTH:  06-04-1986  DATE OF ADMISSION:  06/03/2012  IDENTIFYING INFORMATION: The patient is a 29 year old white male not employed, last worked when he was 29 years old. The patient is single and never married and lives with his mother who is 42 years old. The patient comes back for readmission to [facility] after he was discharged on 10/25/2011 when he was admitted for being drunk and shooting up heroin.   CHIEF COMPLAINT: "I started drinking real heavy badly and then I told my brother I did not want to live. He called the sheriff and they brought me here. You see, I was drunk; I did not mean it. I will be going to [facility] in [place], [place] on the 27th of this month and I'm looking forward to getting help from them".  HISTORY OF PRESENT ILLNESS: According to information obtained from the chart, the patient was discharged from [facility] on 10/25/2011 after being detoxed from alcohol.  He was discharged on the following medications: Tegretol 200 mg p.o. b.i.d. and Seroquel 25 mg p.o. at bedtime. The patient reports that he's being followed at [facility] and that he's being followed by the doctor there and last appointment was on 05/31/2012 and next appointment is coming up on 06/15/2012. Prior to him going to the [facility] in [place], [place] his medications were changed as follows: 1. Lithium carbonate 300 mg p.o. q.a.m. and 600 mg at bedtime. 2. Seroquel 200 mg p.o. at bedtime. 3. Cogentin 2 mg at bedtime.  PAST PSYCHIATRIC HISTORY: The patient has had multiple inpatient hospitalizations on Psychiatry according to history obtained from the chart. He was diagnosed with bipolar disorder in [place] and was treated with Zyprexa with good results. He moved to [facility] and continued Zyprexa. Currently he is on the medications listed above. The patient currently reports that he has been drinking alcohol  at a rate of quarter of a fifth every other day. His brother pays for it or his father gives him money for the same. He has a long history of alcohol drinking and problems with the same and realizes that he has to go to [facility] in [place]. The patient reported that he never tried to kill himself and never had a suicide attempt but just talks about it when he's drunk and does not mean it.   FAMILY HISTORY OF MENTAL ILLNESS: Father has schizophrenia and substance abuse. Brother is a substance abuser, drinks alcohol. The patient reported "my family members have mental illness; my mother, my father, everybody and my brother". No known history of suicides in the family.   FAMILY HISTORY: Raised by both parents. Father was a Curator. Father is living and is 67 years old. He is in touch with father who gives him money to buy alcohol. Mother is living and 40 years old. She is a Futures trader. He has two brothers. Close to one of his brothers and lives with mother and is in touch with father and parents are divorced.   PERSONAL HISTORY: Born in [place], [place]. Dropped out in the 11th grade because he was kicked out because of violent behavior. No GED.   WORK HISTORY: First job was at [employer] at age 32 years. Job lasted for a year, quit because he was fired for violent behavior and anger outbursts. 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 This job lasted for  three years and he was let go and terminated for violent behavior and getting into arguments. Last worked "can't remember, a long time ago".   MILITARY HISTORY: None.   MARRIAGES: Never married. Dated a lot. Has two kids that are 28 years old and 28 years old. Has not seen them in a long time because he's not allowed to see them and no child support being paid.   ALCOHOL AND DRUGS: First drink of alcohol at age 26-1/2 years. This was given to him by his parents. Started drinking often when he was [age] or 29 years old. Never  had a DWI. Never arrested for public drunkenness. Currently he reports that he drinks about a quarter of a fifth of hard liquor every other day. Last drink was prior to his admission when he was drunk and started talking about wishes of death. Denies using any other drugs except that he smokes weed occasionally. Does admit smoking nicotine cigarettes at a rate of 1 to 2 packs a day for many years.   MEDICAL HISTORY: No known high blood pressure. No known diabetes mellitus. No major surgeries. Status post tonsillectomy, remote as a kid. No major injuries. History of hypothyroidism. No history of motor vehicle accident. Never been unconscious. He does not have any regular physician, goes to Urgent Care.   ALLERGIES: Geodon gave him seizures. This is questionable.   PHYSICAL EXAMINATION:  VITAL SIGNS: Temperature 98.4, pulse 72 per minute and regular, respirations 20 per minute and regular, blood pressure 122/78 mmHg.  HEENT: Head is normocephalic and atraumatic. Pupils equal, round, and reactive to light and accommodation. Fundi bilaterally benign. Extraocular movements good. Tympanic membranes visualized. No exudate.   NECK: Supple without any organomegaly, lymphadenopathy, or thyromegaly.  CHEST: Normal expansion. Normal breath sounds heard.   HEART: Normal S1, S2 without any murmurs or gallops.   ABDOMEN: Soft. No organomegaly. Bowel sounds heard.   SKIN: Normal turgor.   EXTREMITIES: Nontender. Normal range of motion. No signs of injury. No pedal edema.   NEUROLOGIC: Gait is normal. Romberg is negative. Cranial nerves II through XII are grossly intact. Deep tendon reflexes 2+. Plantars are normal response.   MENTAL STATUS EXAMINATION: The patient is dressed in street clothes. He has tattoo marks all over his forearms. Grooming is below average, face not shaved. Alert and oriented to place, person, and time. Fully aware of the reason why he came to the hospital. Denies feeling depressed.  Denies feeling hopeless or helpless. Insight is looking forward to going to [facility] for help. Denies feeling worthless or useless. Denies any suicidal or homicidal ideas or plans and said "I was drunk you see when I said that". No evidence of psychosis. Denies auditory or visual hallucinations. Denies hearing voices and said "not now, probably in the past.". Denies any appetite or sleep disturbance. Admits he has good appetite and he rests well. Cognition is intact. General knowledge and information for his level of education. He could spell the word world forward and backward without much problem. Insight and judgment guarded and questionable.   IMPRESSION: AXIS I: 1. Bipolar I disorder, in remission. 2. Polysubstance dependence, current drug of choice is alcohol and was intoxicated at the time of admission. 3. Nicotine dependence.  4. Weed abuse.  AXIS II: Deferred.   AXIS III: None major.   AXIS IV: Occupational, financial, primary support system, alcohol dependence with no insight into the problem.  AXIS V: GAF 30.   PLAN:  1. The  patient will be admitted to [facility] for close observation, evaluation, and help.  2. He will be started back on the medications that he has been taking at [facility].  3. He will be on CIWA protocol. 4. During the stay in the hospital, he will be given milieu therapy and supportive counseling. He will take part in individual and group therapy where substance abuse will be addressed. At the time of discharge he will not have any ideas to hurt himself or others and will be stable and detoxed from alcohol and follow-up appointment will be made. Social Services will be aware that the patient has to go to [facility] in [place] on 06/15/2012.   ____________________________ Jannet Mantis. Guss Bunde, MD skc:drc D: 06/03/2012 17:31:13 ET T: 06/04/2012 08:38:32 ET JOB#: [id number]  cc: Monika Salk K. Guss Bunde, MD, <Dictator> Beau Fanny  MD ELECTRONICALLY SIGNED 06/04/2012 16:11

## 2015-04-13 NOTE — Consult Note (Signed)
Brief Consult Note: Diagnosis: Cocaine abuse, Schizoaffective disorder bipolar type, history of polysubstance dependence.   Patient was seen by consultant.   Consult note dictated.   Recommend further assessment or treatment.   Orders entered.   Discussed with Attending MD.   Comments: Nathan [name] has a h/o mood instability and substance abuse. He just completed residential susbtance abuse treatment program at [facility] in [place]. He has been partially compliant with his medications but participates in therapy group. He used cocaine last night with his brother. The patient made vague suicidal remarks and his brother called EMS. He is no longer suicidal or homicidal. He is not psychotic. His mood appears stable. He has suprtive mother.    PLAN: 1. The patient no longer meets criteria for IVC. I will terminate proceedings. Please discharge as appropriate.  2. He will follow up with [facility] for merdication managment and [facility] for substance abuse.   3. He is to continue group psychotherapy.  Electronic Signatures: Kristine LineaPucilowska, Hai Grabe (MD)  (Signed 12-Feb-13 11:23)  Authored: Brief Consult Note   Last Updated: 12-Feb-13 11:23 by Kristine LineaPucilowska, Fredda Clarida (MD)

## 2015-04-13 NOTE — Consult Note (Signed)
PATIENT NAME:  Nathan [name], Nathan [name] MR#:  [id number] DATE OF BIRTH:  [birth year]  DATE OF CONSULTATION:  02/19/2012  REFERRING PHYSICIAN:   CONSULTING PHYSICIAN:  Quentin Ore III, MD  PRIMARY CARE PHYSICIAN: None.   CHIEF COMPLAINT: Left arm pain.   BRIEF HISTORY: Kailon Treese is a 29 year old gentleman seen in the Emergency Room with an abscess of his left antecubital fossa. He is an IV drug user and was seen last night in the Emergency Room with a cellulitic area in his antecubital fossa. He was placed on antibiotic therapy and limited incision and drainage was attempted in the Emergency Room. He is noncompliant and did not take his antibiotics. He returns with complaint of increased pain in his arm. However, he is clearly intoxicated. He has history of polysubstance abuse and has a long relationship with Behavioral Medicine here at [facility]. He was last seen in February.   He has history of bipolar disorder and polysubstance abuse. He has been well controlled on Tegretol and Seroquel but has recently stopped taking his medications. His medical history is otherwise largely unremarkable. He has no history of cardiac disease, hypertension, or diabetes. His only previous surgery was a tonsillectomy. He denies any other abdominal or chest symptoms at the present time.   CURRENT MEDICATIONS:  1. Tegretol 200 mg p.o. b.i.d.  2. Seroquel 200 mg p.o. daily.  3. Oxycodone 5 mg p.o. t.i.d.  4. Cephalexin 500 mg q.i.d.   5. Bactrim DS 2 tabs b.i.d.  6. He is no longer taking BuSpar or Klonopin.   ALLERGIES: He is allergic to aspirin, ibuprofen, and Naprosyn.   SOCIAL HISTORY: He is a cigarette smoker. Drinks alcohol regularly. Uses multiple illicit drugs currently including marijuana and cocaine.   REVIEW OF SYSTEMS: Review of systems is impossible due to the patient's intoxicated state.   PHYSICAL EXAMINATION:   GENERAL: He is uncooperative, angry, threatening to hip staff  and providers. He wants to have something to eat.   VITAL SIGNS: Vital signs are stable. Blood pressure 117/63, heart rate 96 and regular, oxygen saturation 96%, temperature 99.   HEENT: Small pupils. Facial exam reveals no deformities. He has no scleral icterus.   NECK: Supple without adenopathy. Trachea is midline.   CHEST: Clear with no adventitious sounds. He has normal pulmonary excursion.   CARDIAC: No murmurs or gallops to my ear. He seems to be in normal sinus rhythm.   ABDOMEN: Benign with no masses or organomegaly, rebound or guarding. No hernias are noted.   EXTREMITIES: Lower extremity exam reveals a large area of cellulitis in the left antecubital fossa with a weeping cellulitic change at the brachial area. There is an obvious intense cellulitis with a small abscess in the center. He has good distal pulses. He has full range of motion of the lower extremities.   PSYCHIATRIC: Psychiatric exam is compromised significantly by his intoxication.   IMPRESSION: This young man has an obvious abscess in his left arm with cellulitis. He is clearly intoxicated at the present time. We would recommend admission to the hospital with antibiotic therapy, allow his mental status to clear, protect him against delirium tremens and make plans for an incision and drainage procedure in the morning. The patient's mother is present during the examination. The patient flatly refuses to be admitted to the hospital, is uncooperative and combative.      At the present time he does not have any indications for commitment and we will allow  him to be discharged on his own accord in the company of his mother.   ____________________________ Carmie Endalph L. Ely III, MD rle:drc D: 02/20/2012 00:02:50 ET T: 02/20/2012 09:01:31 ET JOB#: [id number]  cc: Carmie Endalph L. Ely III, MD, <Dictator> Behavioral Medicine Unit, [facility]  Quentin OreALPH L ELY MD ELECTRONICALLY SIGNED 02/22/2012 6:19

## 2015-04-13 NOTE — H&P (Signed)
PATIENT NAME:  Nathan Nathan [name], Nathan [name] MR#:  [id number] DATE OF BIRTH:  [birth year]  DATE OF ADMISSION:  06/03/2012  REFERRING PHYSICIAN: Daryel November, MD   ADMITTING PHYSICIAN: Margarita Rana, MD  ATTENDING PHYSICIAN: Kristine Linea, MD   IDENTIFYING DATA: Nathan Nathan [name] Nathan [name] is a 29 year old male with history of mood instability and substance abuse.   CHIEF COMPLAINT: "I wasn't suicidal."  HISTORY OF PRESENT ILLNESS: Nathan Nathan [name] Nathan [name] is well known to [place]. He frequently returns to the emergency room and was admitted many times to our unit following drug binges and suicidal threats. He reports that he has been doing well lately in preparation for participation in court ordered [facility] [place] program at the end of this month. He hopes that it will alleviate his sentence. He however has a brother and a father who are both drug users. Every time he meets with his father and brother, he uses substances and oftentimes becomes depressed and suicidal. He reports that he has been drinking with his brother, told his brother that he does not want to live anymore, and the brother called police immediately. The patient was also positive for cocaine and marijuana, but he adamantly denies using, other than alcohol, substances. It is unclear how long the patient has been drinking, but Dr. Guss Bunde placed him on a CIWA protocol following admission. He is a patient of [facility] and Dr. Rogers Blocker recently adjusted his medication. Tegretol was substituted with lithium and Seroquel was increased to 200 mg at bedtime. The patient reports good treatment adherence, however, lithium level was not checked on admission, and he was not given any lithium up until today. The patient denies any symptoms of depression, anxiety, or psychosis. He denies symptoms suggestive of bipolar mania.   PAST PSYCHIATRIC HISTORY: He has been in treatment for many years now. He was very successfully treated with Zyprexa in [place]. At that time he was using crystal  meth. Upon his return to [place], he has been hospitalized at [facility] numerous times for mood problems, substance abuse, and suicidal threats. He denies ever having a suicide attempt. He admits that when on drugs sometimes he gets sentimental and tells his brothers about his problems and they always "freak out". The patient believes that lithium is the most important medication and insists that it is given here. Dr. Guss Bunde for reasons that I do not fully understand put him back on Tegretol that was prescribed at the time of his last hospitalization here in December.   FAMILY PSYCHIATRIC HISTORY: Numerous family members including his father and brother are both mentally ill and substance abusers. There is no history of suicide in the family.   PAST MEDICAL HISTORY: None reported.   ALLERGIES: Aspirin, Geodon, ibuprofen, and Naproxen.   MEDICATIONS ON ADMISSION:  1. Lithium carbonate 300 mg in the morning, 600 mg at bedtime.  2. Seroquel 200 mg at bedtime. 3. Cogentin 2 mg at bedtime.   SOCIAL HISTORY: He grew up in [place]. His parents are divorced. He lives with his mother. This is a good and safe place for him. His father also lives in the area. He is a substance user and enabler. The patient does not work. He has some old charges of possession. That is why he decided to enter [facility] in [place]. This is a 90 day residential program. He has not married, but he has two children, ages 46 and 46. He is not paying child support and therefore he is not allowed to  see his kids. He lives with his mother. He admits to alcohol abuse. He does not feel he needs detox. He denies other substance use in spite of evidence to the contrary.   REVIEW OF SYSTEMS: CONSTITUTIONAL: No fevers or chills. No weight changes. EYES: No double or blurred vision. ENT: No hearing loss. RESPIRATORY: No shortness of breath or cough. CARDIOVASCULAR: No chest pain or orthopnea.  GASTROINTESTINAL: No abdominal pain, nausea, vomiting, or diarrhea. GU: No incontinence or frequency. ENDOCRINE: No heat or cold intolerance. LYMPHATIC: No anemia or easy bruising. INTEGUMENTARY: No acne or rash. MUSCULOSKELETAL: No muscle or joint pain. NEUROLOGIC: No tingling or weakness. PSYCHIATRIC: See history of present illness for details.   PHYSICAL EXAMINATION:   VITAL SIGNS: Blood pressure 116/65, pulse 80, respirations 20, and temperature 97.8.   GENERAL: This is a slender male in no acute distress.   HEENT: The pupils are equal, round, and reactive to light. Sclerae anicteric.   NECK: Supple. No thyromegaly.   LUNGS: Clear to auscultation. No dullness to percussion.   HEART: Regular rhythm and rate. No murmurs, rubs, or gallops.   ABDOMEN: Soft, nontender, and nondistended. Positive bowel sounds.   MUSCULOSKELETAL: Normal muscle strength in all extremities.   SKIN: No rashes or bruises.   LYMPHATIC: No cervical adenopathy.   NEUROLOGIC: Cranial nerves II through XII are intact. Normal gait.  LABORATORY DATA: Chemistries are within normal limits. Blood alcohol level on admission 0.035. LFTs within normal limits. TSH 1.39. Urine tox screen positive for benzodiazepines, cocaine, and cannabinoid. CBC within normal limits. Urinalysis is not suggestive of urinary tract infection.   MENTAL STATUS EXAMINATION ON ADMISSION: The patient is alert and oriented to person, place, time, and situation. He is pleasant, polite, and cooperative. He recognizes me from previous admission. He is initially in bed wearing hospital scrubs. He maintains some eye contact. His speech is soft. Mood is fine with full affect. Thought processing is logical and goal oriented. Thought content - he denies suicidal or homicidal ideation but was brought to the hospital after voicing suicidal thoughts while drunk. No delusions or paranoia. There are no auditory or visual hallucinations. His cognition is grossly  intact. He registers three out of three and recalls three out of three objects after 5 minutes. He can spell world forwards and backwards. He knows the current president. His insight and judgment are extremely poor.   SUICIDE RISK ASSESSMENT ON ADMISSION: This is a patient with a long history of mood instability and substance abuse who came to the hospital voicing suicidal ideation while drunk.     DIAGNOSES:  AXIS I:  1. Mood disorder, not otherwise specified.  2. Alcohol dependence.  3. Cocaine abuse. 4. Benzodiazepine abuse. 5. Cannabis abuse.   AXIS II: Deferred.   AXIS III: None.   AXIS IV: Substance abuse, primary support, employment, financial.   AXIS V: GAF on admission 25.   PLAN: The patient was admitted to [facility] Medicine unit for safety, stabilization, and medication management. He was initially placed on suicide precautions and was closely monitored for any unsafe behaviors. He underwent full psychiatric and risk assessment. He received pharmacotherapy, individual and group psychotherapy, substance abuse counseling, and support from therapeutic milieu.  1. Suicidality: This has resolved. The patient is able to contract for safety.  2. Alcohol detox: It is unclear how much the patient has been drinking. Dr. Guss Bunde started him on the CIWA protocol. We will continue.  3. Mood: We will discontinue  Tegretol and put him back on lithium. Level was not checked but he has not been taking it for two days. He should be okay. We will recheck the level. We will [facility]eroquel [facility]             4. Substance abuse: The patient is very adamant that all his problems would be solved by this [facility] that he is ordered to go. We were unable to confirm that he indeed has been accepted to the program as we have to reach his parole officer first. 5. Disposition: To be  established. ____________________________ Ellin GoodieJolanta B. Jennet MaduroPucilowska, MD jbp:slb D: 06/05/2012 18:56:51 ET T: 06/06/2012 07:32:15 ET JOB#: [id number]  cc: Kaydi Kley B. Jennet MaduroPucilowska, MD, <Dictator> Shari ProwsJOLANTA B Zuria Fosdick MD ELECTRONICALLY SIGNED 06/09/2012 4:16

## 2015-06-11 ENCOUNTER — Emergency Department: Payer: Self-pay

## 2015-06-11 ENCOUNTER — Emergency Department
Admission: EM | Admit: 2015-06-11 | Discharge: 2015-06-11 | Disposition: A | Discharge status: Home / Self Care | Payer: Self-pay | Attending: Emergency Medicine | Admitting: Emergency Medicine

## 2015-06-11 DIAGNOSIS — Z720 Tobacco use: Secondary | ICD-10-CM | POA: Insufficient documentation

## 2015-06-11 DIAGNOSIS — R0789 Other chest pain: Secondary | ICD-10-CM | POA: Insufficient documentation

## 2015-06-11 DIAGNOSIS — J069 Acute upper respiratory infection, unspecified: Secondary | ICD-10-CM | POA: Insufficient documentation

## 2015-06-11 MED ORDER — PSEUDOEPH-BROMPHEN-DM 30-2-10 MG/5ML PO SYRP
5.0000 mL | ORAL_SOLUTION | Freq: Four times a day (QID) | ORAL | Status: DC | PRN
Start: 2015-06-11 — End: 2016-04-21

## 2015-06-11 MED ORDER — ALBUTEROL SULFATE HFA 108 (90 BASE) MCG/ACT IN AERS: 2.0000 | INHALATION_SPRAY | Freq: Four times a day (QID) | RESPIRATORY_TRACT | Status: DC | PRN

## 2015-06-11 MED ORDER — IPRATROPIUM-ALBUTEROL 0.5-2.5 (3) MG/3ML IN SOLN
3.0000 mL | Freq: Once | RESPIRATORY_TRACT | Status: AC
  Administered 2015-06-11: 3 mL via RESPIRATORY_TRACT

## 2015-06-11 MED ORDER — IPRATROPIUM-ALBUTEROL 0.5-2.5 (3) MG/3ML IN SOLN
RESPIRATORY_TRACT | Status: AC
  Administered 2015-06-11: 3 mL via RESPIRATORY_TRACT
  Filled 2015-06-11: qty 3

## 2015-06-11 NOTE — ED Provider Notes (Signed)
[facility] Emergency Department Provider Note  ____________________________________________  Time seen: Approximately 11:34 AM  I have reviewed the triage vital signs and the nursing notes.   HISTORY  Chief Complaint Cough    HPI Akil Hoos Badal is a 29 y.o. male complaining of a minor continuous cough and a tightness in his chest. Patient denies any sinus congestion or ear pressure pain or sore throat. He denies any fever chills or diarrhea. Patient is an active smoker smoking up to a pack a day patient say smoking history approximately 10 years. Patient denies any fever with this complaint.   No past medical history on file.  There are no active problems to display for this patient.   No past surgical history on file.  Current Outpatient Rx  Name  Route  Sig  Dispense  Refill  . albuterol (PROVENTIL HFA;VENTOLIN HFA) 108 (90 BASE) MCG/ACT inhaler   Inhalation   Inhale 2 puffs into the lungs every 6 (six) hours as needed for wheezing or shortness of breath.   1 Inhaler   2   . brompheniramine-pseudoephedrine-DM 30-2-10 MG/5ML syrup   Oral   Take 5 mLs by mouth 4 (four) times daily as needed.   120 mL   0     Allergies Review of patient's allergies indicates not on file.  No family history on file.  Social History History  Substance Use Topics  . Smoking status: Not on file  . Smokeless tobacco: Not on file  . Alcohol Use: Not on file    Review of Systems Constitutional: No fever/chills Eyes: No visual changes. ENT: No sore throat. Cardiovascular: Denies chest pain. Respiratory: Tight chest tightness and chronic cough. Gastrointestinal: No abdominal pain.  No nausea, no vomiting.  No diarrhea.  No constipation. Genitourinary: Negative for dysuria. Musculoskeletal: Negative for back pain. Skin: Negative for rash. Neurological: Negative for headaches, focal weakness or numbness. 10-point ROS otherwise  negative.  ____________________________________________   PHYSICAL EXAM:  VITAL SIGNS: ED Triage Vitals  Enc Vitals Group     BP 06/11/15 1100 122/58 mmHg     Pulse Rate 06/11/15 1100 78     Resp 06/11/15 1107 20     Temp 06/11/15 1100 98 F (36.7 C)     Temp Source 06/11/15 1100 Oral     SpO2 06/11/15 1100 99 %     Weight 06/11/15 1100 125 lb (56.7 kg)     Height 06/11/15 1100  (1.727 m)     Head Cir --      Peak Flow --      Pain Score 06/11/15 1100 2     Pain Loc --      Pain Edu? --      Excl. in GC? --     Constitutional: Alert and oriented. Well appearing and in no acute distress. Afebrile Eyes: Conjunctivae are normal. PERRL. EOMI. Head: Atraumatic. Nose: No congestion/rhinnorhea. Mouth/Throat: Mucous membranes are moist.  Oropharynx non-erythematous. Neck: No stridor.  Full nuchal range of motion nontender palpation. Hematological/Lymphatic/Immunilogical: No cervical lymphadenopathy. Cardiovascular: Normal rate, regular rhythm. Grossly normal heart sounds.  Good peripheral circulation. Respiratory: Normal respiratory effort.  No retractions. Lungs CTAB. Gastrointestinal: Soft and nontender. No distention. No abdominal bruits. No CVA tenderness. Musculoskeletal: No lower extremity tenderness nor edema.  No joint effusions. Neurologic:  Normal speech and language. No gross focal neurologic deficits are appreciated. Speech is normal. No gait instability. Skin:  Skin is warm, dry and intact. No rash noted.  Psychiatric: Mood and affect are normal. Speech and behavior are normal.  ____________________________________________   LABS (all labs ordered are listed, but only abnormal results are displayed)  Labs Reviewed - No data to display ____________________________________________  EKG   ____________________________________________  RADIOLOGY  No acute findings on chest x-ray ____________________________________________   PROCEDURES  Procedure(s)  performed: None  Critical Care performed: No  ____________________________________________   INITIAL IMPRESSION / ASSESSMENT AND PLAN / ED COURSE  Pertinent labs & imaging results that were available during my care of the patient were reviewed by me and considered in my medical decision making (see chart for details).  Respiratory infection. ____________________________________________   FINAL CLINICAL IMPRESSION(S) / ED DIAGNOSES  Final diagnoses:  URI (upper respiratory infection)      Joni Reining, PA-C 06/11/15 1209  Darien Ramus, MD 06/11/15 [phone]

## 2015-06-11 NOTE — ED Notes (Signed)
Patient states his "lungs feel heavy".  Minor cough. Active 1ppd smoker. Feels like he has difficulty taking a deep breath

## 2015-06-11 NOTE — Discharge Instructions (Signed)
Cough, Adult   A cough is a reflex. It helps you clear your throat and airways. A cough can help heal your body. A cough can last 2 or 3 weeks (acute) or may last more than 8 weeks (chronic). Some common causes of a cough can include an infection, allergy, or a cold.  HOME CARE  · Only take medicine as told by your doctor.  · If given, take your medicines (antibiotics) as told. Finish them even if you start to feel better.  · Use a cold steam vaporizer or humidifier in your home. This can help loosen thick spit (secretions).  · Sleep so you are almost sitting up (semi-upright). Use pillows to do this. This helps reduce coughing.  · Rest as needed.  · Stop smoking if you smoke.  GET HELP RIGHT AWAY IF:  · You have yellowish-white fluid (pus) in your thick spit.  · Your cough gets worse.  · Your medicine does not reduce coughing, and you are losing sleep.  · You cough up blood.  · You have trouble breathing.  · Your pain gets worse and medicine does not help.  · You have a fever.  MAKE SURE YOU:   · Understand these instructions.  · Will watch your condition.  · Will get help right away if you are not doing well or get worse.  Document Released: 08/19/2011 Document Revised: 04/22/2014 Document Reviewed: 08/19/2011  ExitCare® Patient Information ©2015 [employer]. This information is not intended to replace advice given to you by your health care provider. Make sure you discuss any questions you have with your health care provider.

## 2015-09-05 ENCOUNTER — Encounter: Payer: Self-pay | Admitting: Occupational Medicine

## 2015-09-05 ENCOUNTER — Emergency Department
Admission: EM | Admit: 2015-09-05 | Discharge: 2015-09-05 | Disposition: A | Discharge status: Home / Self Care | Payer: Self-pay | Attending: Emergency Medicine | Admitting: Emergency Medicine

## 2015-09-05 DIAGNOSIS — Y9289 Other specified places as the place of occurrence of the external cause: Secondary | ICD-10-CM | POA: Insufficient documentation

## 2015-09-05 DIAGNOSIS — F1210 Cannabis abuse, uncomplicated: Secondary | ICD-10-CM | POA: Insufficient documentation

## 2015-09-05 DIAGNOSIS — Z720 Tobacco use: Secondary | ICD-10-CM | POA: Insufficient documentation

## 2015-09-05 DIAGNOSIS — F329 Major depressive disorder, single episode, unspecified: Secondary | ICD-10-CM | POA: Insufficient documentation

## 2015-09-05 DIAGNOSIS — Y998 Other external cause status: Secondary | ICD-10-CM | POA: Insufficient documentation

## 2015-09-05 DIAGNOSIS — F10920 Alcohol use, unspecified with intoxication, uncomplicated: Secondary | ICD-10-CM

## 2015-09-05 DIAGNOSIS — X781XXA Intentional self-harm by knife, initial encounter: Secondary | ICD-10-CM | POA: Insufficient documentation

## 2015-09-05 DIAGNOSIS — IMO0002 Reserved for concepts with insufficient information to code with codable children: Secondary | ICD-10-CM

## 2015-09-05 DIAGNOSIS — F1290 Cannabis use, unspecified, uncomplicated: Secondary | ICD-10-CM

## 2015-09-05 DIAGNOSIS — S51812A Laceration without foreign body of left forearm, initial encounter: Secondary | ICD-10-CM | POA: Insufficient documentation

## 2015-09-05 DIAGNOSIS — F10120 Alcohol abuse with intoxication, uncomplicated: Secondary | ICD-10-CM | POA: Insufficient documentation

## 2015-09-05 DIAGNOSIS — Y9389 Activity, other specified: Secondary | ICD-10-CM | POA: Insufficient documentation

## 2015-09-05 HISTORY — DX: Chronic viral hepatitis C: B18.2

## 2015-09-05 HISTORY — DX: Headache: R51

## 2015-09-05 HISTORY — DX: Headache, unspecified: R51.9

## 2015-09-05 HISTORY — DX: Other chronic pain: G89.29

## 2015-09-05 LAB — CBC
HEMATOCRIT: 43.3 % (ref 40.0–52.0)
HEMOGLOBIN: 14.5 g/dL (ref 13.0–18.0)
MCH: 31.6 pg (ref 26.0–34.0)
MCHC: 33.5 g/dL (ref 32.0–36.0)
MCV: 94.3 fL (ref 80.0–100.0)
Platelets: 237 10*3/uL (ref 150–440)
RBC: 4.59 MIL/uL (ref 4.40–5.90)
RDW: 14.3 % (ref 11.5–14.5)
WBC: 9.7 10*3/uL (ref 3.8–10.6)

## 2015-09-05 LAB — COMPREHENSIVE METABOLIC PANEL
ALBUMIN: 4.5 g/dL (ref 3.5–5.0)
ALT: 106 U/L — ABNORMAL HIGH (ref 17–63)
ANION GAP: 6 (ref 5–15)
AST: 55 U/L — AB (ref 15–41)
Alkaline Phosphatase: 72 U/L (ref 38–126)
BUN: 10 mg/dL (ref 6–20)
CHLORIDE: 107 mmol/L (ref 101–111)
CO2: 25 mmol/L (ref 22–32)
Calcium: 8.9 mg/dL (ref 8.9–10.3)
Creatinine, Ser: 0.73 mg/dL (ref 0.61–1.24)
GFR calc Af Amer: >60 mL/min (ref >60)
GFR calc non Af Amer: >60 mL/min (ref >60)
GLUCOSE: 90 mg/dL (ref 65–99)
POTASSIUM: 3.8 mmol/L (ref 3.5–5.1)
SODIUM: 138 mmol/L (ref 135–145)
Total Bilirubin: 0.5 mg/dL (ref 0.3–1.2)
Total Protein: 8.3 g/dL — ABNORMAL HIGH (ref 6.5–8.1)

## 2015-09-05 LAB — ETHANOL: Alcohol, Ethyl (B): 238 mg/dL — ABNORMAL HIGH (ref <5)

## 2015-09-05 LAB — URINE DRUG SCREEN, QUALITATIVE (ARMC ONLY)
Amphetamines, Ur Screen: NOT DETECTED
Barbiturates, Ur Screen: NOT DETECTED
Benzodiazepine, Ur Scrn: NOT DETECTED
CANNABINOID 50 NG, UR ~~LOC~~: POSITIVE — AB
COCAINE METABOLITE, UR ~~LOC~~: NOT DETECTED
MDMA (ECSTASY) UR SCREEN: NOT DETECTED
Methadone Scn, Ur: NOT DETECTED
OPIATE, UR SCREEN: NOT DETECTED
PHENCYCLIDINE (PCP) UR S: NOT DETECTED
TRICYCLIC, UR SCREEN: NOT DETECTED

## 2015-09-05 LAB — SALICYLATE LEVEL

## 2015-09-05 LAB — ACETAMINOPHEN LEVEL: Acetaminophen (Tylenol), Serum: <10 ug/mL — ABNORMAL LOW (ref 10–30)

## 2015-09-05 MED ORDER — MUPIROCIN 2 % EX OINT: TOPICAL_OINTMENT | CUTANEOUS | Status: DC

## 2015-09-05 MED ORDER — IBUPROFEN 600 MG PO TABS
600.0000 mg | ORAL_TABLET | Freq: Once | ORAL | Status: AC
  Administered 2015-09-05: 600 mg via ORAL
  Filled 2015-09-05: qty 1

## 2015-09-05 MED ORDER — NICOTINE 10 MG IN INHA
1.0000 | RESPIRATORY_TRACT | Status: DC | PRN
  Administered 2015-09-05: 1 via RESPIRATORY_TRACT
  Filled 2015-09-05: qty 36

## 2015-09-05 MED ORDER — SILVER NITRATE-POT NITRATE 75-25 % EX MISC
CUTANEOUS | Status: AC
  Administered 2015-09-05: 03:00:00
  Filled 2015-09-05: qty 1

## 2015-09-05 MED ORDER — MICROFIBRILLAR COLL HEMOSTAT EX POWD
CUTANEOUS | Status: AC
  Administered 2015-09-05: 03:00:00
  Filled 2015-09-05: qty 5

## 2015-09-05 NOTE — ED Provider Notes (Signed)
Patient evaluated by me, not a threat to himself or others. He discharged with Bactroban ointment and encouraged to follow-up with his doctor for reevaluation.  Emily Filbert, MD 09/05/15 [phone]
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
## 2015-09-05 NOTE — ED Notes (Signed)
BEHAVIORAL HEALTH ROUNDING Patient sleeping: No. Patient alert and oriented: yes Behavior appropriate: Yes.  ; If no, describe:  Nutrition and fluids offered: Yes  Toileting and hygiene offered: Yes  Sitter present: yes Law enforcement present: Yes  
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
## 2015-09-05 NOTE — ED Notes (Signed)
TTS at the bedside, pt tearful wanting to call his wife to say he is sorry and that he loves her. I offered to call his wife and let her know pt refused said he wanted to talk to her. Pt st I am voluntary I should be able to call my wife. What if I leave because I came in by my own will. MD aware ordering IVC.

## 2015-09-05 NOTE — ED Notes (Signed)

## 2015-09-05 NOTE — Discharge Instructions (Signed)
Ethanol This is a test of the blood alcohol level and is used to determine whether your level will impair your driving or other activities and also to determine the possibility of overdose (alcohol poisoning). PREPARATION FOR TEST No preparation or fasting is necessary. NORMAL FINDINGS  Negative  Possible critical values are greater than 300 mg/dl or greater than 65 mmol/L (SI units). Ranges for normal findings may vary among different laboratories and hospitals. You should always check with your doctor after having lab work or other tests done to discuss the meaning of your test results and whether your values are considered within normal limits. MEANING OF TEST  Your caregiver will go over the test results with you and discuss the importance and meaning of your results, as well as treatment options and the need for additional tests if necessary. OBTAINING THE TEST RESULTS  It is your responsibility to obtain your test results. Ask the lab or department performing the test when and how you will get your results. Document Released: 12/29/2004 Document Revised: 02/28/2012 Document Reviewed: 11/15/2008 [facility] Patient Information 2015 [place], [place]. This information is not intended to replace advice given to you by your health care provider. Make sure you discuss any questions you have with your health care provider.  Laceration Care, Adult A laceration is a cut or lesion that goes through all layers of the skin and into the tissue just beneath the skin. TREATMENT  Some lacerations may not require closure. Some lacerations may not be able to be closed due to an increased risk of infection. It is important to see your caregiver as soon as possible after an injury to minimize the risk of infection and maximize the opportunity for successful closure. If closure is appropriate, pain medicines may be given, if needed. The wound will be cleaned to help prevent infection. Your caregiver will use  stitches (sutures), staples, wound glue (adhesive), or skin adhesive strips to repair the laceration. These tools bring the skin edges together to allow for faster healing and a better cosmetic outcome. However, all wounds will heal with a scar. Once the wound has healed, scarring can be minimized by covering the wound with sunscreen during the day for 1 full year. HOME CARE INSTRUCTIONS  For sutures or staples:  Keep the wound clean and dry.  If you were given a bandage (dressing), you should change it at least once a day. Also, change the dressing if it becomes wet or dirty, or as directed by your caregiver.  Wash the wound with soap and water 2 times a day. Rinse the wound off with water to remove all soap. Pat the wound dry with a clean towel.  After cleaning, apply a thin layer of the antibiotic ointment as recommended by your caregiver. This will help prevent infection and keep the dressing from sticking.  You may shower as usual after the first 24 hours. Do not soak the wound in water until the sutures are removed.  Only take over-the-counter or prescription medicines for pain, discomfort, or fever as directed by your caregiver.  Get your sutures or staples removed as directed by your caregiver. For skin adhesive strips:  Keep the wound clean and dry.  Do not get the skin adhesive strips wet. You may bathe carefully, using caution to keep the wound dry.  If the wound gets wet, pat it dry with a clean towel.  Skin adhesive strips will fall off on their own. You may trim the strips as the wound  heals. Do not remove skin adhesive strips that are still stuck to the wound. They will fall off in time. For wound adhesive:  You may briefly wet your wound in the shower or bath. Do not soak or scrub the wound. Do not swim. Avoid periods of heavy perspiration until the skin adhesive has fallen off on its own. After showering or bathing, gently pat the wound dry with a clean towel.  Do not  apply liquid medicine, cream medicine, or ointment medicine to your wound while the skin adhesive is in place. This may loosen the film before your wound is healed.  If a dressing is placed over the wound, be careful not to apply tape directly over the skin adhesive. This may cause the adhesive to be pulled off before the wound is healed.  Avoid prolonged exposure to sunlight or tanning lamps while the skin adhesive is in place. Exposure to ultraviolet light in the first year will darken the scar.  The skin adhesive will usually remain in place for 5 to 10 days, then naturally fall off the skin. Do not pick at the adhesive film. You may need a tetanus shot if:  You cannot remember when you had your last tetanus shot.  You have never had a tetanus shot. If you get a tetanus shot, your arm may swell, get red, and feel warm to the touch. This is common and not a problem. If you need a tetanus shot and you choose not to have one, there is a rare chance of getting tetanus. Sickness from tetanus can be serious. SEEK MEDICAL CARE IF:   You have redness, swelling, or increasing pain in the wound.  You see a red line that goes away from the wound.  You have yellowish-white fluid (pus) coming from the wound.  You have a fever.  You notice a bad smell coming from the wound or dressing.  Your wound breaks open before or after sutures have been removed.  You notice something coming out of the wound such as wood or glass.  Your wound is on your hand or foot and you cannot move a finger or toe. SEEK IMMEDIATE MEDICAL CARE IF:   Your pain is not controlled with prescribed medicine.  You have severe swelling around the wound causing pain and numbness or a change in color in your arm, hand, leg, or foot.  Your wound splits open and starts bleeding.  You have worsening numbness, weakness, or loss of function of any joint around or beyond the wound.  You develop painful lumps near the wound or on  the skin anywhere on your body. MAKE SURE YOU:   Understand these instructions.  Will watch your condition.  Will get help right away if you are not doing well or get worse. Document Released: 12/06/2005 Document Revised: 02/28/2012 Document Reviewed: 06/01/2011 [facility] Patient Information 2015 [place], [place]. This information is not intended to replace advice given to you by your health care provider. Make sure you discuss any questions you have with your health care provider.
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
## 2015-09-05 NOTE — ED Notes (Signed)
Pt given the phone to call wife one time for then after that goes to routine phone times. Pt calmed down.
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
## 2015-09-05 NOTE — ED Notes (Signed)
BEHAVIORAL HEALTH ROUNDING Patient sleeping: Yes.   Patient alert and oriented: yes Behavior appropriate: Yes.  ; If no, describe: sleeping Nutrition and fluids offered: sleeping Toileting and hygiene offered: sleeping Sitter present: yes checks and security camera monitoring Law enforcement present: Yes ODS

## 2015-09-05 NOTE — ED Notes (Signed)
Pt. Noted in room appears to be sleeping No complaints or concerns voiced. No distress or abnormal behavior noted. Will continue to monitor with security cameras. Q 15 minute rounds continue.  Checked wound to left arm. Dressing intact no bleeding thru. MD made aware.   BEHAVIORAL HEALTH ROUNDING  Patient sleeping: Yes.  Patient alert and oriented: sleeping  Behavior appropriate: Yes. ; If no, describe: sleeping  Nutrition and fluids offered:sleeping  Toileting and hygiene offered: sleeping  Sitter present: yes 15 minute rounding and security camera monitoring  Law enforcement present: Yes ODS

## 2015-09-05 NOTE — ED Notes (Signed)
Checked wound bleeding doing better with packing under control took off coband applied kerlix MD will look at in the am. Pt was sleeping had to wake up to re dress arm.

## 2015-09-05 NOTE — ED Notes (Signed)
BEHAVIORAL HEALTH ROUNDING Patient sleeping: No Patient alert and oriented: yes Behavior appropriate: Yes.  ; If no, describe: can be loud and tearful  Nutrition and fluids offered: Yes  Toileting and hygiene offered: Yes  Sitter present: yes q 15 min checks and security camera monitoring Law enforcement present: Yes ODS

## 2015-09-05 NOTE — ED Notes (Signed)
Pt denies SI/HI.  Mother is coming to pick him.  Wound care instructions given as well as extra supplies. NAD.  No other questions or acute medical c/os

## 2015-09-05 NOTE — ED Notes (Signed)
MD to see pt. Applied silver nitrate application to wound.

## 2016-04-19 ENCOUNTER — Emergency Department
Admission: EM | Admit: 2016-04-19 | Discharge: 2016-04-19 | Disposition: A | Discharge status: Home / Self Care | Payer: Self-pay | Attending: Emergency Medicine | Admitting: Emergency Medicine

## 2016-04-19 ENCOUNTER — Emergency Department: Payer: Self-pay

## 2016-04-19 ENCOUNTER — Encounter: Payer: Self-pay | Admitting: Emergency Medicine

## 2016-04-19 DIAGNOSIS — F17210 Nicotine dependence, cigarettes, uncomplicated: Secondary | ICD-10-CM | POA: Insufficient documentation

## 2016-04-19 DIAGNOSIS — F319 Bipolar disorder, unspecified: Secondary | ICD-10-CM | POA: Insufficient documentation

## 2016-04-19 DIAGNOSIS — J209 Acute bronchitis, unspecified: Secondary | ICD-10-CM | POA: Insufficient documentation

## 2016-04-19 DIAGNOSIS — R079 Chest pain, unspecified: Secondary | ICD-10-CM | POA: Insufficient documentation

## 2016-04-19 DIAGNOSIS — F1290 Cannabis use, unspecified, uncomplicated: Secondary | ICD-10-CM | POA: Insufficient documentation

## 2016-04-19 HISTORY — DX: Bipolar disorder, unspecified: F31.9

## 2016-04-19 LAB — URINE DRUG SCREEN, QUALITATIVE (ARMC ONLY)
AMPHETAMINES, UR SCREEN: NOT DETECTED
BENZODIAZEPINE, UR SCRN: NOT DETECTED
Barbiturates, Ur Screen: NOT DETECTED
Cannabinoid 50 Ng, Ur ~~LOC~~: POSITIVE — AB
Cocaine Metabolite,Ur ~~LOC~~: NOT DETECTED
MDMA (Ecstasy)Ur Screen: NOT DETECTED
METHADONE SCREEN, URINE: NOT DETECTED
Opiate, Ur Screen: NOT DETECTED
PHENCYCLIDINE (PCP) UR S: NOT DETECTED
TRICYCLIC, UR SCREEN: NOT DETECTED

## 2016-04-19 LAB — ACETAMINOPHEN LEVEL

## 2016-04-19 LAB — COMPREHENSIVE METABOLIC PANEL
ALT: 89 U/L — ABNORMAL HIGH (ref 17–63)
ANION GAP: 11 (ref 5–15)
AST: 87 U/L — ABNORMAL HIGH (ref 15–41)
Albumin: 5.1 g/dL — ABNORMAL HIGH (ref 3.5–5.0)
Alkaline Phosphatase: 66 U/L (ref 38–126)
BILIRUBIN TOTAL: 0.8 mg/dL (ref 0.3–1.2)
BUN: 10 mg/dL (ref 6–20)
CO2: 25 mmol/L (ref 22–32)
Calcium: 10.3 mg/dL (ref 8.9–10.3)
Chloride: 102 mmol/L (ref 101–111)
Creatinine, Ser: 0.81 mg/dL (ref 0.61–1.24)
Glucose, Bld: 104 mg/dL — ABNORMAL HIGH (ref 65–99)
POTASSIUM: 4.3 mmol/L (ref 3.5–5.1)
Sodium: 138 mmol/L (ref 135–145)
TOTAL PROTEIN: 8.9 g/dL — AB (ref 6.5–8.1)

## 2016-04-19 LAB — CBC
HCT: 48.5 % (ref 40.0–52.0)
Hemoglobin: 16.5 g/dL (ref 13.0–18.0)
MCH: 31.3 pg (ref 26.0–34.0)
MCHC: 34.1 g/dL (ref 32.0–36.0)
MCV: 91.6 fL (ref 80.0–100.0)
PLATELETS: 294 10*3/uL (ref 150–440)
RBC: 5.29 MIL/uL (ref 4.40–5.90)
RDW: 14.6 % — AB (ref 11.5–14.5)
WBC: 9.2 10*3/uL (ref 3.8–10.6)

## 2016-04-19 LAB — SALICYLATE LEVEL

## 2016-04-19 LAB — LIPASE, BLOOD: LIPASE: 18 U/L (ref 11–51)

## 2016-04-19 LAB — ETHANOL

## 2016-04-19 MED ORDER — LORAZEPAM 2 MG/ML IJ SOLN
1.0000 mg | Freq: Once | INTRAMUSCULAR | Status: AC
  Administered 2016-04-19: 1 mg via INTRAVENOUS
  Filled 2016-04-19: qty 1

## 2016-04-19 MED ORDER — NAPROXEN 500 MG PO TABS: 500.0000 mg | ORAL_TABLET | Freq: Two times a day (BID) | ORAL | Status: DC

## 2016-04-19 MED ORDER — HYDROCODONE-HOMATROPINE 5-1.5 MG/5ML PO SYRP: 5.0000 mL | ORAL_SOLUTION | Freq: Four times a day (QID) | ORAL | Status: DC | PRN

## 2016-04-19 MED ORDER — ACETAMINOPHEN 500 MG PO TABS
ORAL_TABLET | ORAL | Status: AC
Start: 2016-04-19 — End: 2016-04-19
  Administered 2016-04-19: 1000 mg via ORAL
  Filled 2016-04-19: qty 2

## 2016-04-19 MED ORDER — ACETAMINOPHEN 500 MG PO TABS
1000.0000 mg | ORAL_TABLET | Freq: Once | ORAL | Status: AC
  Administered 2016-04-19: 1000 mg via ORAL

## 2016-04-19 MED ORDER — PREDNISONE 20 MG PO TABS: 40.0000 mg | ORAL_TABLET | Freq: Every day | ORAL | Status: DC

## 2016-04-19 MED ORDER — SODIUM CHLORIDE 0.9 % IV BOLUS (SEPSIS)
1000.0000 mL | Freq: Once | INTRAVENOUS | Status: AC
  Administered 2016-04-19: 1000 mL via INTRAVENOUS

## 2016-04-19 MED ORDER — IOPAMIDOL (ISOVUE-370) INJECTION 76%
80.0000 mL | Freq: Once | INTRAVENOUS | Status: AC | PRN
  Administered 2016-04-19: 80 mL via INTRAVENOUS

## 2016-04-19 MED ORDER — GUAIFENESIN 100 MG/5ML PO SOLN: 5.0000 mL | ORAL | Status: DC | PRN

## 2016-04-19 NOTE — ED Notes (Addendum)
Pt here by [facility]; reports increased anxiety and states "I want to die"; reports chest pain, pt is hyperventilating in triage. Pt was seen at [facility] and d/c with anxiety. Pt reports cough with bloody mucous x1 week. Pt has history bipolar and explosive anger, denies medication use. Reports marijuana and alcohol use. Pt is here voluntary; pt denies HI, hallucinations. Pt reports he will hang himself.
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
## 2016-04-19 NOTE — ED Provider Notes (Signed)
[facility] Emergency Department Provider Note  ____________________________________________  Time seen: 8:15 AM  I have reviewed the triage vital signs and the nursing notes.   HISTORY  Chief Complaint Suicidal    HPI Nathan RodHarmon [name] Dewaine [name] is a 30 y.o. male who complains of chest pain for the past week. He also has a productive cough with sputum and blood tinged. Chest pain is intermittent, sharp and severe, nonradiating. Hurts to breathe. No aggravating or alleviating factors other than the breathing. No shortness of breath. No dizziness diaphoresis fevers chills or vomiting.  He also has anxiety and feeling fearful due to his other complaints, saying if we will help him then he remains well die.  He was seen at another hospital within the past few days where they told him that it was anxiety and discharged him home. He feels that he was not taken seriously. He is also worried that he smoked marijuana, sharing with a friend he has a history of a MRSA lung infection, and is worried that he might have contracted it himself.  Feel safe at present time, denies any active suicidal ideation or plans to hurt himself or intent to harm himself. He states that he does want to live and is eager to go back to work.   Past Medical History  Diagnosis Date  . 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 Chronic headache   . Bipolar 1 disorder (HCC)      There are no active problems to display for this patient.    Past Surgical History  Procedure Laterality Date  . Tonsillectomy       Current Outpatient Rx  Name  Route  Sig  Dispense  Refill  . albuterol (PROVENTIL HFA;VENTOLIN HFA) 108 (90 BASE) MCG/ACT inhaler   Inhalation   Inhale 2 puffs into the lungs every 6 (six) hours as needed for wheezing or shortness of breath.   1 Inhaler   2   . brompheniramine-pseudoephedrine-DM 30-2-10 MG/5ML syrup   Oral   Take 5 mLs by mouth 4 (four) times daily as needed.   120 mL    0   . guaiFENesin (ROBITUSSIN) 100 MG/5ML SOLN   Oral   Take 5 mLs (100 mg total) by mouth every 4 (four) hours as needed for cough or to loosen phlegm.   120 mL   0   . HYDROcodone-homatropine (HYCODAN) 5-1.5 MG/5ML syrup   Oral   Take 5 mLs by mouth every 6 (six) hours as needed for cough.   120 mL   0   . mupirocin ointment (BACTROBAN) 2 %      Apply to affected area 3 times daily   22 g   0   . naproxen (NAPROSYN) 500 MG tablet   Oral   Take 1 tablet (500 mg total) by mouth 2 (two) times daily with a meal.   20 tablet   0   . predniSONE (DELTASONE) 20 MG tablet   Oral   Take 2 tablets (40 mg total) by mouth daily.   8 tablet   0      Allergies Geodon   No family history on file.  Social History Social History  Substance Use Topics  . Smoking status: Current Every Day Smoker -- 4.50 packs/day    Types: Cigarettes  . Smokeless tobacco: None  . Alcohol Use: 7.2 oz/week    12 Cans of beer per week    Review of Systems  Constitutional:  No fever or chills.  Eyes:   No vision changes.  ENT:   No sore throat. No rhinorrhea. Cardiovascular:   Positive as above chest pain. Respiratory:   No dyspnea positive cough. Gastrointestinal:   Negative for abdominal pain, vomiting and diarrhea.  Genitourinary:   Negative for dysuria or difficulty urinating. Musculoskeletal:   Negative for focal pain or swelling Neurological:   Negative for headaches 10-point ROS otherwise negative.  ____________________________________________   PHYSICAL EXAM:  VITAL SIGNS: ED Triage Vitals  Enc Vitals Group     BP 04/19/16 0725 168/100 mmHg     Pulse Rate 04/19/16 0725 84     Resp 04/19/16 0725 24     Temp 04/19/16 0725 98.2 F (36.8 C)     Temp Source 04/19/16 0725 Oral     SpO2 04/19/16 0725 99 %     Weight 04/19/16 0725 125 lb (56.7 kg)     Height 04/19/16 0725  (1.753 m)     Head Cir --      Peak Flow --      Pain Score 04/19/16 0725 6     Pain Loc --       Pain Edu? --      Excl. in GC? --     Vital signs reviewed, nursing assessments reviewed.   Constitutional:   Alert and oriented. Well appearing and in no distress. Eyes:   No scleral icterus. No conjunctival pallor. PERRL. EOMI.  No nystagmus. ENT   Head:   Normocephalic and atraumatic.   Nose:   No congestion/rhinnorhea. No septal hematoma   Mouth/Throat:   MMM, no pharyngeal erythema. No peritonsillar mass.    Neck:   No stridor. No SubQ emphysema. No meningismus. Hematological/Lymphatic/Immunilogical:   No cervical lymphadenopathy. Cardiovascular:   RRR. Symmetric bilateral radial and DP pulses.  No murmurs.  Respiratory:   Normal respiratory effort without tachypnea nor retractions. Breath sounds are clear and equal bilaterally. No wheezes/rales/rhonchi. Gastrointestinal:   Soft and nontender. Non distended. There is no CVA tenderness.  No rebound, rigidity, or guarding. Genitourinary:   deferred Musculoskeletal:   Nontender with normal range of motion in all extremities. No joint effusions.  No lower extremity tenderness.  No edema. Chest wall nontender Neurologic:   Normal speech and language.  CN 2-10 normal. Motor grossly intact. No gross focal neurologic deficits are appreciated.  Skin:    Skin is warm, dry and intact. No rash noted.  No petechiae, purpura, or bullae.  ____________________________________________    LABS (pertinent positives/negatives) (all labs ordered are listed, but only abnormal results are displayed) Labs Reviewed  COMPREHENSIVE METABOLIC PANEL - Abnormal; Notable for the following:    Glucose, Bld 104 (*)    Total Protein 8.9 (*)    Albumin 5.1 (*)    AST 87 (*)    ALT 89 (*)    All other components within normal limits  ACETAMINOPHEN LEVEL - Abnormal; Notable for the following:    Acetaminophen (Tylenol), Serum <10 (*)    All other components within normal limits  CBC - Abnormal; Notable for the following:    RDW 14.6 (*)     All other components within normal limits  URINE DRUG SCREEN, QUALITATIVE ([facility] ONLY) - Abnormal; Notable for the following:    Cannabinoid 50 Ng, Ur [place] POSITIVE (*)    All other components within normal limits  ETHANOL  SALICYLATE LEVEL  LIPASE, BLOOD   ____________________________________________   EKG  Interpreted by me  Normal sinus rhythm rate of 96, normal axis and intervals. Poor R wave depression in interpreter leads. Normal ST segments. Isolated T-wave inversion in lead 3. Additionally, there is a S1 Q3 T3 pattern suggestive of right heart strain.  ____________________________________________    RADIOLOGY  CT angiogram chest unremarkable  ____________________________________________   PROCEDURES   ____________________________________________   INITIAL IMPRESSION / ASSESSMENT AND PLAN / ED COURSE  Pertinent labs & imaging results that were available during my care of the patient were reviewed by me and considered in my medical decision making (see chart for details).  Patient well appearing no acute distress, but does report chest pain shortness of breath hemoptysis and has concerning EKG findings to suggest PE. However CT angiogram was negative. Labs unremarkable. He did report some vague suicidal ideation, but this actually appears be related to her frustration over healing blown off by the previous hospital visit that he had. He feels much better knowing that this is a bronchitis and will go home.Considering the patient's symptoms, medical history, and physical examination today, I have low suspicion for ACS, PE, TAD, pneumothorax, carditis, mediastinitis, pneumonia, CHF, or sepsis.       ____________________________________________   FINAL CLINICAL IMPRESSION(S) / ED DIAGNOSES  Final diagnoses:  Chest pain  Acute bronchitis, unspecified organism       Portions of this note were generated with dragon dictation software. Dictation errors may occur  despite best attempts at proofreading.   Sharman Cheek, MD 04/19/16 1213

## 2016-04-19 NOTE — ED Notes (Signed)
Nathan [name] at bedside at this time.

## 2016-04-19 NOTE — Discharge Instructions (Signed)
Acute Bronchitis °Bronchitis is inflammation of the airways that extend from the windpipe into the lungs (bronchi). The inflammation often causes mucus to develop. This leads to a cough, which is the most common symptom of bronchitis.  °In acute bronchitis, the condition usually develops suddenly and goes away over time, usually in a couple weeks. Smoking, allergies, and asthma can make bronchitis worse. Repeated episodes of bronchitis may cause further lung problems.  °CAUSES °Acute bronchitis is most often caused by the same virus that causes a cold. The virus can spread from person to person (contagious) through coughing, sneezing, and touching contaminated objects. °SIGNS AND SYMPTOMS  °· Cough.   °· Fever.   °· Coughing up mucus.   °· Body aches.   °· Chest congestion.   °· Chills.   °· Shortness of breath.   °· Sore throat.   °DIAGNOSIS  °Acute bronchitis is usually diagnosed through a physical exam. Your health care provider will also ask you questions about your medical history. Tests, such as chest X-rays, are sometimes done to rule out other conditions.  °TREATMENT  °Acute bronchitis usually goes away in a couple weeks. Oftentimes, no medical treatment is necessary. Medicines are sometimes given for relief of fever or cough. Antibiotic medicines are usually not needed but may be prescribed in certain situations. In some cases, an inhaler may be recommended to help reduce shortness of breath and control the cough. A cool mist vaporizer may also be used to help thin bronchial secretions and make it easier to clear the chest.  °HOME CARE INSTRUCTIONS °· Get plenty of rest.   °· Drink enough fluids to keep your urine clear or pale yellow (unless you have a medical condition that requires fluid restriction). Increasing fluids may help thin your respiratory secretions (sputum) and reduce chest congestion, and it will prevent dehydration.   °· Take medicines only as directed by your health care provider. °· If  you were prescribed an antibiotic medicine, finish it all even if you start to feel better. °· Avoid smoking and secondhand smoke. Exposure to cigarette smoke or irritating chemicals will make bronchitis worse. If you are a smoker, consider using nicotine gum or skin patches to help control withdrawal symptoms. Quitting smoking will help your lungs heal faster.   °· Reduce the chances of another bout of acute bronchitis by washing your hands frequently, avoiding people with cold symptoms, and trying not to touch your hands to your mouth, nose, or eyes.   °· Keep all follow-up visits as directed by your health care provider.   °SEEK MEDICAL CARE IF: °Your symptoms do not improve after 1 week of treatment.  °SEEK IMMEDIATE MEDICAL CARE IF: °· You develop an increased fever or chills.   °· You have chest pain.   °· You have severe shortness of breath. °· You have bloody sputum.   °· You develop dehydration. °· You faint or repeatedly feel like you are going to pass out. °· You develop repeated vomiting. °· You develop a severe headache. °MAKE SURE YOU:  °· Understand these instructions. °· Will watch your condition. °· Will get help right away if you are not doing well or get worse. °  °This information is not intended to replace advice given to you by your health care provider. Make sure you discuss any questions you have with your health care provider. °  °Document Released: 01/13/2005 Document Revised: 12/27/2014 Document Reviewed: 05/29/2013 °Elsevier Interactive Patient Education ©2016 [employer] ° °Nonspecific Chest Pain  °Chest pain can be caused by many different conditions. There is always a chance   that your pain could be related to something serious, such as a heart attack or a blood clot in your lungs. Chest pain can also be caused by conditions that are not life-threatening. If you have chest pain, it is very important to follow up with your health care provider. °CAUSES  °Chest pain can be caused  by: °· Heartburn. °· Pneumonia or bronchitis. °· Anxiety or stress. °· Inflammation around your heart (pericarditis) or lung (pleuritis or pleurisy). °· A blood clot in your lung. °· A collapsed lung (pneumothorax). It can develop suddenly on its own (spontaneous pneumothorax) or from trauma to the chest. °· Shingles infection (varicella-zoster virus). °· Heart attack. °· Damage to the bones, muscles, and cartilage that make up your chest wall. This can include: °¨ Bruised bones due to injury. °¨ Strained muscles or cartilage due to frequent or repeated coughing or overwork. °¨ Fracture to one or more ribs. °¨ Sore cartilage due to inflammation (costochondritis). °RISK FACTORS  °Risk factors for chest pain may include: °· Activities that increase your risk for trauma or injury to your chest. °· Respiratory infections or conditions that cause frequent coughing. °· Medical conditions or overeating that can cause heartburn. °· Heart disease or family history of heart disease. °· Conditions or health behaviors that increase your risk of developing a blood clot. °· Having had chicken pox (varicella zoster). °SIGNS AND SYMPTOMS °Chest pain can feel like: °· Burning or tingling on the surface of your chest or deep in your chest. °· Crushing, pressure, aching, or squeezing pain. °· Dull or sharp pain that is worse when you move, cough, or take a deep breath. °· Pain that is also felt in your back, neck, shoulder, or arm, or pain that spreads to any of these areas. °Your chest pain may come and go, or it may stay constant. °DIAGNOSIS °Lab tests or other studies may be needed to find the cause of your pain. Your health care provider may have you take a test called an ambulatory ECG (electrocardiogram). An ECG records your heartbeat patterns at the time the test is performed. You may also have other tests, such as: °· Transthoracic echocardiogram (TTE). During echocardiography, sound waves are used to create a picture of all  of the heart structures and to look at how blood flows through your heart. °· Transesophageal echocardiogram (TEE). This is a more advanced imaging test that obtains images from inside your body. It allows your health care provider to see your heart in finer detail. °· Cardiac monitoring. This allows your health care provider to monitor your heart rate and rhythm in real time. °· Holter monitor. This is a portable device that records your heartbeat and can help to diagnose abnormal heartbeats. It allows your health care provider to track your heart activity for several days, if needed. °· Stress tests. These can be done through exercise or by taking medicine that makes your heart beat more quickly. °· Blood tests. °· Imaging tests. °TREATMENT  °Your treatment depends on what is causing your chest pain. Treatment may include: °· Medicines. These may include: °¨ Acid blockers for heartburn. °¨ Anti-inflammatory medicine. °¨ Pain medicine for inflammatory conditions. °¨ Antibiotic medicine, if an infection is present. °¨ Medicines to dissolve blood clots. °¨ Medicines to treat coronary artery disease. °· Supportive care for conditions that do not require medicines. This may include: °¨ Resting. °¨ Applying heat or cold packs to injured areas. °¨ Limiting activities until pain decreases. °HOME CARE INSTRUCTIONS °· If   you were prescribed an antibiotic medicine, finish it all even if you start to feel better. °· Avoid any activities that bring on chest pain. °· Do not use any tobacco products, including cigarettes, chewing tobacco, or electronic cigarettes. If you need help quitting, ask your health care provider. °· Do not drink alcohol. °· Take medicines only as directed by your health care provider. °· Keep all follow-up visits as directed by your health care provider. This is important. This includes any further testing if your chest pain does not go away. °· If heartburn is the cause for your chest pain, you may be  told to keep your head raised (elevated) while sleeping. This reduces the chance that acid will go from your stomach into your esophagus. °· Make lifestyle changes as directed by your health care provider. These may include: °¨ Getting regular exercise. Ask your health care provider to suggest some activities that are safe for you. °¨ Eating a heart-healthy diet. A registered dietitian can help you to learn healthy eating options. °¨ Maintaining a healthy weight. °¨ Managing diabetes, if necessary. °¨ Reducing stress. °SEEK MEDICAL CARE IF: °· Your chest pain does not go away after treatment. °· You have a rash with blisters on your chest. °· You have a fever. °SEEK IMMEDIATE MEDICAL CARE IF:  °· Your chest pain is worse. °· You have an increasing cough, or you cough up blood. °· You have severe abdominal pain. °· You have severe weakness. °· You faint. °· You have chills. °· You have sudden, unexplained chest discomfort. °· You have sudden, unexplained discomfort in your arms, back, neck, or jaw. °· You have shortness of breath at any time. °· You suddenly start to sweat, or your skin gets clammy. °· You feel nauseous or you vomit. °· You suddenly feel light-headed or dizzy. °· Your heart begins to beat quickly, or it feels like it is skipping beats. °These symptoms may represent a serious problem that is an emergency. Do not wait to see if the symptoms will go away. Get medical help right away. Call your local emergency services (911 in the [place] Do not drive yourself to the hospital. °  °This information is not intended to replace advice given to you by your health care provider. Make sure you discuss any questions you have with your health care provider. °  °Document Released: 09/15/2005 Document Revised: 12/27/2014 Document Reviewed: 07/12/2014 °Elsevier Interactive Patient Education ©2016 [employer] ° °

## 2016-04-20 ENCOUNTER — Encounter: Payer: Self-pay | Admitting: Emergency Medicine

## 2016-04-20 ENCOUNTER — Emergency Department
Admission: EM | Admit: 2016-04-20 | Discharge: 2016-04-20 | Disposition: A | Discharge status: Home / Self Care | Payer: Self-pay | Attending: Emergency Medicine | Admitting: Emergency Medicine

## 2016-04-20 DIAGNOSIS — J0110 Acute frontal sinusitis, unspecified: Secondary | ICD-10-CM | POA: Insufficient documentation

## 2016-04-20 DIAGNOSIS — F319 Bipolar disorder, unspecified: Secondary | ICD-10-CM | POA: Insufficient documentation

## 2016-04-20 DIAGNOSIS — F17210 Nicotine dependence, cigarettes, uncomplicated: Secondary | ICD-10-CM | POA: Insufficient documentation

## 2016-04-20 DIAGNOSIS — F1290 Cannabis use, unspecified, uncomplicated: Secondary | ICD-10-CM | POA: Insufficient documentation

## 2016-04-20 MED ORDER — AMOXICILLIN 875 MG PO TABS: 875.0000 mg | ORAL_TABLET | Freq: Two times a day (BID) | ORAL | Status: DC

## 2016-04-20 NOTE — ED Notes (Signed)
1:1 sitter removed; rover and security present.

## 2016-04-20 NOTE — ED Notes (Addendum)
Pt reports blurry vision and a "cold flow sensation" starting at the base of the right skull and radiating forward. Also, verbalized worry about possible hep C. Eyes assessed, equal and reactive to light. No redness in eye.

## 2016-04-20 NOTE — ED Provider Notes (Signed)
[facility] Emergency Department Provider Note  ____________________________________________  Time seen: 3:50 PM  I have reviewed the triage vital signs and the nursing notes.   HISTORY  Chief Complaint Blurred Vision and Headache    HPI Nathan [name] is a 30 y.o. male who comes to the ED today due to scalp pain and right eye blurriness this morning. He said chest pain shortness of breath over the past week, was seen by the [facility] ED and diagnosed with anxiety after negative workup. Was seen by me here yesterday and diagnosed with bronchitis after CT angiogram of the chest was unremarkable. He reports that after taking prednisone and guaifenesin that I prescribed him yesterday he is feeling better. He took Hycodan last night and subsequently vomited, and this morning has a feeling of tingliness or coldness proceeding from his scalp at the base of the skull posteriorly over the top of the scalp. Also feels like his right eye is blurry. He also notes that he has broken teeth with some swelling around them.     Past Medical History  Diagnosis Date  . 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 Chronic headache   . Bipolar 1 disorder (HCC)      There are no active problems to display for this patient.    Past Surgical History  Procedure Laterality Date  . Tonsillectomy       Current Outpatient Rx  Name  Route  Sig  Dispense  Refill  . albuterol (PROVENTIL HFA;VENTOLIN HFA) 108 (90 BASE) MCG/ACT inhaler   Inhalation   Inhale 2 puffs into the lungs every 6 (six) hours as needed for wheezing or shortness of breath.   1 Inhaler   2   . amoxicillin (AMOXIL) 875 MG tablet   Oral   Take 1 tablet (875 mg total) by mouth 2 (two) times daily.   20 tablet   0   . brompheniramine-pseudoephedrine-DM 30-2-10 MG/5ML syrup   Oral   Take 5 mLs by mouth 4 (four) times daily as needed.   120 mL   0   . guaiFENesin (ROBITUSSIN) 100 MG/5ML SOLN   Oral   Take 5 mLs  (100 mg total) by mouth every 4 (four) hours as needed for cough or to loosen phlegm.   120 mL   0   . mupirocin ointment (BACTROBAN) 2 %      Apply to affected area 3 times daily   22 g   0   . naproxen (NAPROSYN) 500 MG tablet   Oral   Take 1 tablet (500 mg total) by mouth 2 (two) times daily with a meal.   20 tablet   0   . predniSONE (DELTASONE) 20 MG tablet   Oral   Take 2 tablets (40 mg total) by mouth daily.   8 tablet   0      Allergies Geodon   No family history on file.  Social History Social History  Substance Use Topics  . Smoking status: Current Every Day Smoker -- 4.50 packs/day    Types: Cigarettes  . Smokeless tobacco: None  . Alcohol Use: 7.2 oz/week    12 Cans of beer per week    Review of Systems  Constitutional:  Subjective fever, no chills.  Eyes:   No vision changes.  ENT:   No sore throat. No rhinorrhea. Positive broken teeth with gingival swelling Cardiovascular:   Recent chest pain now resolved. Respiratory:   Recent shortness of  breath and cough, resolved today. Gastrointestinal:   Negative for abdominal pain, vomiting and diarrhea.  Genitourinary:   Negative for dysuria or difficulty urinating. Musculoskeletal:   Negative for focal pain or swelling Neurological:   Positive as above for headaches. No numbness or tingling or weakness. 10-point ROS otherwise negative.  ____________________________________________   PHYSICAL EXAM:  VITAL SIGNS: ED Triage Vitals  Enc Vitals Group     BP 04/20/16 1407 139/93 mmHg     Pulse Rate 04/20/16 1407 84     Resp 04/20/16 1407 20     Temp 04/20/16 1407 98.3 F (36.8 C)     Temp Source 04/20/16 1407 Oral     SpO2 04/20/16 1407 97 %     Weight 04/20/16 1358 125 lb (56.7 kg)     Height 04/20/16 1358 5\' 8"  (1.727 m)     Head Cir --      Peak Flow --      Pain Score 04/20/16 1359 6     Pain Loc --      Pain Edu? --      Excl. in GC? --     Vital signs reviewed, nursing assessments  reviewed.   Constitutional:   Alert and oriented. Well appearing and in no distress.Energetic Eyes:   No scleral icterus. No conjunctival pallor. PERRL. EOMI.  No nystagmus. Normal concentric reflex, no APD ENT   Head:   Normocephalic and atraumatic.  TMs normal bilateral, external canals normal bilateral. Pain on percussion of left maxillary sinus   Nose:   No congestion/rhinnorhea. No septal hematoma   Mouth/Throat:   MMM, no pharyngeal erythema. No peritonsillar mass. Diffuse dental decay, especially the molars. There are decayed fractured molar stumps posteriorly particularly in the upper jaw, with gingival swelling around them. No fluctuance but there is erythema and tenderness. No drainage at this time. No floor of mouth edema or swelling or tongue elevation.   Neck:   No stridor. No SubQ emphysema. No meningismus. There is tenseness in the suboccipital scalene muscles on the right which reproduces his head and scalp pain and causes radiation of the tingliness up over his scalp. Hematological/Lymphatic/Immunilogical:   Small tender lymphadenopathy submandibular bilateral. Cardiovascular:   RRR. Symmetric bilateral radial and DP pulses.  No murmurs.  Respiratory:   Normal respiratory effort without tachypnea nor retractions. Breath sounds are clear and equal bilaterally. No wheezes/rales/rhonchi. Gastrointestinal:   Soft and nontender. Non distended. There is no CVA tenderness.  No rebound, rigidity, or guarding. Genitourinary:   deferred Musculoskeletal:   Nontender with normal range of motion in all extremities. No joint effusions.  No lower extremity tenderness.  No edema. Neurologic:   Normal speech and language.  CN 2-10 normal. Motor grossly intact. Normal gait, normal coordination No gross focal neurologic deficits are appreciated.  Skin:    Skin is warm, dry and intact. No rash noted.  No petechiae, purpura, or bullae.  ____________________________________________     LABS (pertinent positives/negatives) (all labs ordered are listed, but only abnormal results are displayed) Labs Reviewed - No data to display ____________________________________________   EKG    ____________________________________________    RADIOLOGY    ____________________________________________   PROCEDURES   ____________________________________________   INITIAL IMPRESSION / ASSESSMENT AND PLAN / ED COURSE  Pertinent labs & imaging results that were available during my care of the patient were reviewed by me and considered in my medical decision making (see chart for details).  Patient well appearing no acute distress, appears  much better than yesterday. Psychiatrically stable, medically stable. It appears to have a gingivitis around broken decayed teeth and reports recently having some drainage from the area. There is no appreciable abscess at this time, no secondary infection. No airway threat or compromise. Also appears to have a sinusitis. As his symptoms have been going on for greater than a week now and reports subjective fevers at home, I'll start him on amoxicillin while he is following up with his primary care. Otherwise his symptoms are improving and he looks much better than yesterday. Counseled him to discontinue Hycodan and he states that he has already disposed of it. I'll start him on amoxicillin.     ____________________________________________   FINAL CLINICAL IMPRESSION(S) / ED DIAGNOSES  Final diagnoses:  Acute frontal sinusitis, recurrence not specified       Portions of this note were generated with dragon dictation software. Dictation errors may occur despite best attempts at proofreading.   Sharman CheekPhillip Genora Arp, MD 04/20/16 [phone]

## 2016-04-20 NOTE — Discharge Instructions (Signed)

## 2016-04-20 NOTE — ED Notes (Signed)
Pt states he is having right eye blurriness that began this am, was seen here yesterday and treated for bronchitis, is also having a headache, very talkative and nervous at triage.

## 2016-04-21 ENCOUNTER — Emergency Department
Admission: EM | Admit: 2016-04-21 | Discharge: 2016-04-22 | Disposition: A | Discharge status: Other Acute Inpatient Hospital | Payer: Self-pay | Attending: Emergency Medicine | Admitting: Emergency Medicine

## 2016-04-21 ENCOUNTER — Emergency Department
Admission: EM | Admit: 2016-04-21 | Discharge: 2016-04-21 | Disposition: A | Discharge status: Home / Self Care | Payer: Self-pay | Attending: Emergency Medicine | Admitting: Emergency Medicine

## 2016-04-21 ENCOUNTER — Encounter: Payer: Self-pay | Admitting: *Deleted

## 2016-04-21 DIAGNOSIS — F319 Bipolar disorder, unspecified: Secondary | ICD-10-CM

## 2016-04-21 DIAGNOSIS — F1290 Cannabis use, unspecified, uncomplicated: Secondary | ICD-10-CM | POA: Insufficient documentation

## 2016-04-21 DIAGNOSIS — H9202 Otalgia, left ear: Secondary | ICD-10-CM | POA: Insufficient documentation

## 2016-04-21 DIAGNOSIS — F19980 Other psychoactive substance use, unspecified with psychoactive substance-induced anxiety disorder: Secondary | ICD-10-CM

## 2016-04-21 DIAGNOSIS — F419 Anxiety disorder, unspecified: Secondary | ICD-10-CM | POA: Insufficient documentation

## 2016-04-21 DIAGNOSIS — H9313 Tinnitus, bilateral: Secondary | ICD-10-CM

## 2016-04-21 DIAGNOSIS — R0789 Other chest pain: Secondary | ICD-10-CM | POA: Insufficient documentation

## 2016-04-21 DIAGNOSIS — R45851 Suicidal ideations: Secondary | ICD-10-CM | POA: Insufficient documentation

## 2016-04-21 DIAGNOSIS — F1010 Alcohol abuse, uncomplicated: Secondary | ICD-10-CM

## 2016-04-21 DIAGNOSIS — F3163 Bipolar disorder, current episode mixed, severe, without psychotic features: Secondary | ICD-10-CM

## 2016-04-21 DIAGNOSIS — R079 Chest pain, unspecified: Secondary | ICD-10-CM

## 2016-04-21 DIAGNOSIS — H9203 Otalgia, bilateral: Secondary | ICD-10-CM

## 2016-04-21 DIAGNOSIS — F17210 Nicotine dependence, cigarettes, uncomplicated: Secondary | ICD-10-CM | POA: Insufficient documentation

## 2016-04-21 LAB — CBC WITH DIFFERENTIAL/PLATELET
Basophils Absolute: 0 10*3/uL (ref 0–0.1)
Basophils Relative: 0 %
EOS ABS: 0 10*3/uL (ref 0–0.7)
HCT: 47.8 % (ref 40.0–52.0)
Hemoglobin: 16.3 g/dL (ref 13.0–18.0)
LYMPHS ABS: 1.2 10*3/uL (ref 1.0–3.6)
Lymphocytes Relative: 16 %
MCH: 31.8 pg (ref 26.0–34.0)
MCHC: 34.1 g/dL (ref 32.0–36.0)
MCV: 93.3 fL (ref 80.0–100.0)
Monocytes Absolute: 0.3 10*3/uL (ref 0.2–1.0)
Neutro Abs: 5.6 10*3/uL (ref 1.4–6.5)
Neutrophils Relative %: 79 %
PLATELETS: 292 10*3/uL (ref 150–440)
RBC: 5.13 MIL/uL (ref 4.40–5.90)
RDW: 14.4 % (ref 11.5–14.5)
WBC: 7.1 10*3/uL (ref 3.8–10.6)

## 2016-04-21 LAB — COMPREHENSIVE METABOLIC PANEL
ALT: 106 U/L — ABNORMAL HIGH (ref 17–63)
ANION GAP: 14 (ref 5–15)
AST: 84 U/L — ABNORMAL HIGH (ref 15–41)
Albumin: 5.4 g/dL — ABNORMAL HIGH (ref 3.5–5.0)
Alkaline Phosphatase: 60 U/L (ref 38–126)
BUN: 10 mg/dL (ref 6–20)
CALCIUM: 10.3 mg/dL (ref 8.9–10.3)
CHLORIDE: 98 mmol/L — AB (ref 101–111)
CO2: 23 mmol/L (ref 22–32)
Creatinine, Ser: 0.7 mg/dL (ref 0.61–1.24)
GFR calc non Af Amer: >60 mL/min (ref >60)
Glucose, Bld: 103 mg/dL — ABNORMAL HIGH (ref 65–99)
POTASSIUM: 4.9 mmol/L (ref 3.5–5.1)
SODIUM: 135 mmol/L (ref 135–145)
Total Bilirubin: 1.3 mg/dL — ABNORMAL HIGH (ref 0.3–1.2)
Total Protein: 9.2 g/dL — ABNORMAL HIGH (ref 6.5–8.1)

## 2016-04-21 LAB — URINALYSIS COMPLETE WITH MICROSCOPIC (ARMC ONLY)
Bacteria, UA: NONE SEEN
Bilirubin Urine: NEGATIVE
Glucose, UA: NEGATIVE mg/dL
Hgb urine dipstick: NEGATIVE
KETONES UR: NEGATIVE mg/dL
Leukocytes, UA: NEGATIVE
NITRITE: NEGATIVE
PH: 6 (ref 5.0–8.0)
PROTEIN: NEGATIVE mg/dL
SPECIFIC GRAVITY, URINE: 1.002 — AB (ref 1.005–1.030)
SQUAMOUS EPITHELIAL / LPF: NONE SEEN

## 2016-04-21 LAB — TROPONIN I: Troponin I: <0.03 ng/mL (ref <0.031)

## 2016-04-21 NOTE — ED Notes (Signed)
Dr.Clapacs at bedside  

## 2016-04-21 NOTE — Discharge Instructions (Signed)
You have been seen in the emergency department today for chest discomfort,   and ear discomfort/ringing. Labs including blood work, and urine do not show any acute abnormalities. As we have discussed please discontinue use of prednisone, you may finish your prescription of antibiotic. Please follow-up with your primary care physician as soon as possible, follow up with psychiatry as scheduled. Return to the emergency department for any acute, worsening, or personally concerning symptoms.  Nonspecific Chest Pain  Chest pain can be caused by many different conditions. There is always a chance that your pain could be related to something serious, such as a heart attack or a blood clot in your lungs. Chest pain can also be caused by conditions that are not life-threatening. If you have chest pain, it is very important to follow up with your health care provider. CAUSES  Chest pain can be caused by:  Heartburn.  Pneumonia or bronchitis.  Anxiety or stress.  Inflammation around your heart (pericarditis) or lung (pleuritis or pleurisy).  A blood clot in your lung.  A collapsed lung (pneumothorax). It can develop suddenly on its own (spontaneous pneumothorax) or from trauma to the chest.  Shingles infection (varicella-zoster virus).  Heart attack.  Damage to the bones, muscles, and cartilage that make up your chest wall. This can include:  Bruised bones due to injury.  Strained muscles or cartilage due to frequent or repeated coughing or overwork.  Fracture to one or more ribs.  Sore cartilage due to inflammation (costochondritis). RISK FACTORS  Risk factors for chest pain may include:  Activities that increase your risk for trauma or injury to your chest.  Respiratory infections or conditions that cause frequent coughing.  Medical conditions or overeating that can cause heartburn.  Heart disease or family history of heart disease.  Conditions or health behaviors that increase  your risk of developing a blood clot.  Having had chicken pox (varicella zoster). SIGNS AND SYMPTOMS Chest pain can feel like:  Burning or tingling on the surface of your chest or deep in your chest.  Crushing, pressure, aching, or squeezing pain.  Dull or sharp pain that is worse when you move, cough, or take a deep breath.  Pain that is also felt in your back, neck, shoulder, or arm, or pain that spreads to any of these areas. Your chest pain may come and go, or it may stay constant. DIAGNOSIS Lab tests or other studies may be needed to find the cause of your pain. Your health care provider may have you take a test called an ambulatory ECG (electrocardiogram). An ECG records your heartbeat patterns at the time the test is performed. You may also have other tests, such as:  Transthoracic echocardiogram (TTE). During echocardiography, sound waves are used to create a picture of all of the heart structures and to look at how blood flows through your heart.  Transesophageal echocardiogram (TEE).This is a more advanced imaging test that obtains images from inside your body. It allows your health care provider to see your heart in finer detail.  Cardiac monitoring. This allows your health care provider to monitor your heart rate and rhythm in real time.  Holter monitor. This is a portable device that records your heartbeat and can help to diagnose abnormal heartbeats. It allows your health care provider to track your heart activity for several days, if needed.  Stress tests. These can be done through exercise or by taking medicine that makes your heart beat more quickly.  Blood tests.  Imaging tests. TREATMENT  Your treatment depends on what is causing your chest pain. Treatment may include:  Medicines. These may include:  Acid blockers for heartburn.  Anti-inflammatory medicine.  Pain medicine for inflammatory conditions.  Antibiotic medicine, if an infection is  present.  Medicines to dissolve blood clots.  Medicines to treat coronary artery disease.  Supportive care for conditions that do not require medicines. This may include:  Resting.  Applying heat or cold packs to injured areas.  Limiting activities until pain decreases. HOME CARE INSTRUCTIONS  If you were prescribed an antibiotic medicine, finish it all even if you start to feel better.  Avoid any activities that bring on chest pain.  Do not use any tobacco products, including cigarettes, chewing tobacco, or electronic cigarettes. If you need help quitting, ask your health care provider.  Do not drink alcohol.  Take medicines only as directed by your health care provider.  Keep all follow-up visits as directed by your health care provider. This is important. This includes any further testing if your chest pain does not go away.  If heartburn is the cause for your chest pain, you may be told to keep your head raised (elevated) while sleeping. This reduces the chance that acid will go from your stomach into your esophagus.  Make lifestyle changes as directed by your health care provider. These may include:  Getting regular exercise. Ask your health care provider to suggest some activities that are safe for you.  Eating a heart-healthy diet. A registered dietitian can help you to learn healthy eating options.  Maintaining a healthy weight.  Managing diabetes, if necessary.  Reducing stress. SEEK MEDICAL CARE IF:  Your chest pain does not go away after treatment.  You have a rash with blisters on your chest.  You have a fever. SEEK IMMEDIATE MEDICAL CARE IF:   Your chest pain is worse.  You have an increasing cough, or you cough up blood.  You have severe abdominal pain.  You have severe weakness.  You faint.  You have chills.  You have sudden, unexplained chest discomfort.  You have sudden, unexplained discomfort in your arms, back, neck, or jaw.  You  have shortness of breath at any time.  You suddenly start to sweat, or your skin gets clammy.  You feel nauseous or you vomit.  You suddenly feel light-headed or dizzy.  Your heart begins to beat quickly, or it feels like it is skipping beats. These symptoms may represent a serious problem that is an emergency. Do not wait to see if the symptoms will go away. Get medical help right away. Call your local emergency services (911 in the [place] Do not drive yourself to the hospital.   This information is not intended to replace advice given to you by your health care provider. Make sure you discuss any questions you have with your health care provider.   Document Released: 09/15/2005 Document Revised: 12/27/2014 Document Reviewed: 07/12/2014 Elsevier Interactive Patient Education 2016 [employer]  Tinnitus Tinnitus refers to hearing a sound when there is no actual source for that sound. This is often described as ringing in the ears. However, people with this condition may hear a variety of noises. A person may hear the sound in one ear or in both ears.  The sounds of tinnitus can be soft, loud, or somewhere in between. Tinnitus can last for a few seconds or can be constant for days. It may go away without treatment and come back  at various times. When tinnitus is constant or happens often, it can lead to other problems, such as trouble sleeping and trouble concentrating. Almost everyone experiences tinnitus at some point. Tinnitus that is long-lasting (chronic) or comes back often is a problem that may require medical attention.  CAUSES  The cause of tinnitus is often not known. In some cases, it can result from other problems or conditions, including:   Exposure to loud noises from machinery, music, or other sources.  Hearing loss.  Ear or sinus infections.  Earwax buildup.  A foreign object in the ear.  Use of certain medicines.  Use of alcohol and caffeine.  High blood  pressure.  Heart diseases.  Anemia.  Allergies.  Meniere disease.  Thyroid problems.  Tumors.  An enlarged part of a weakened blood vessel (aneurysm). SYMPTOMS The main symptom of tinnitus is hearing a sound when there is no source for that sound. It may sound like:   Buzzing.  Roaring.  Ringing.  Blowing air, similar to the sound heard when you listen to a seashell.  Hissing.  Whistling.  Sizzling.  Humming.  Running water.  A sustained musical note. DIAGNOSIS  Tinnitus is diagnosed based on your symptoms. Your health care provider will do a physical exam. A comprehensive hearing exam (audiologic exam) will be done if your tinnitus:   Affects only one ear (unilateral).  Causes hearing difficulties.  Lasts 6 months or longer. You may also need to see a health care provider who specializes in hearing disorders (audiologist). You may be asked to complete a questionnaire to determine the severity of your tinnitus. Tests may be done to help determine the cause and to rule out other conditions. These can include:  Imaging studies of your head and brain, such as:  A CT scan.  An MRI.  An imaging study of your blood vessels (angiogram). TREATMENT  Treating an underlying medical condition can sometimes make tinnitus go away. If your tinnitus continues, other treatments may include:  Medicines, such as certain antidepressants or sleeping aids.  Sound generators to mask the tinnitus. These include:  Tabletop sound machines that play relaxing sounds to help you fall asleep.  Wearable devices that fit in your ear and play sounds or music.  A small device that uses headphones to deliver a signal embedded in music (acoustic neural stimulation). In time, this may change the pathways of your brain and make you less sensitive to tinnitus. This device is used for very severe cases when no other treatment is working.  Therapy and counseling to help you manage the  stress of living with tinnitus.  Using hearing aids or cochlear implants, if your tinnitus is related to hearing loss. HOME CARE INSTRUCTIONS  When possible, avoid being in loud places and being exposed to loud sounds.  Wear hearing protection, such as earplugs, when you are exposed to loud noises.  Do not take stimulants, such as nicotine, alcohol, or caffeine.  Practice techniques for reducing stress, such as meditation, yoga, or deep breathing.  Use a white noise machine, a humidifier, or other devices to mask the sound of tinnitus.  Sleep with your head slightly raised. This may reduce the impact of tinnitus.  Try to get plenty of rest each night. SEEK MEDICAL CARE IF:  You have tinnitus in just one ear.  Your tinnitus continues for 3 weeks or longer without stopping.  Home care measures are not helping.  You have tinnitus after a head injury.  You  have tinnitus along with any of the following:  Dizziness.  Loss of balance.  Nausea and vomiting.   This information is not intended to replace advice given to you by your health care provider. Make sure you discuss any questions you have with your health care provider.   Document Released: 12/06/2005 Document Revised: 12/27/2014 Document Reviewed: 05/08/2014 Elsevier Interactive Patient Education [employer]

## 2016-04-21 NOTE — ED Provider Notes (Signed)
[facility] Emergency Department Provider Note  Time seen: 5:07 PM  I have reviewed the triage vital signs and the nursing notes.   HISTORY  Chief Complaint Fever    HPI Nathan [name] is a 30 y.o. male with a past medical history of hepatitis C, chronic headaches, bipolar disorder, presents to the emergency department with complaints of chest discomfort and ringing in his ears and burning sensations to his ears. According to the patient and record review he was seen at [facility] 4/29, at [facility] 5/1, 5/2, and now again 5/3. Patient was diagnosed with possible sinus disease/infection and placed on prednisone as well as amoxicillin. Patient states he is extremely frustrated at this point as no one has been able to diagnose what is going on, and he feels like he is going to die. Patient doesn't history of severe anxiety, went to the acute crisis center 2 days ago and was given a prescription of Valium. Patient states that has not helped either. States last night he awoke next to his air conditioner, does not remember getting up to go there.  She is very worried that we are missing something in his something is running rampant throughout his body and is going to kill him. Denies any SI or HI.     Past Medical History  Diagnosis Date  . 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 Chronic headache   . Bipolar 1 disorder (HCC)     There are no active problems to display for this patient.   Past Surgical History  Procedure Laterality Date  . Tonsillectomy      Current Outpatient Rx  Name  Route  Sig  Dispense  Refill  . albuterol (PROVENTIL HFA;VENTOLIN HFA) 108 (90 BASE) MCG/ACT inhaler   Inhalation   Inhale 2 puffs into the lungs every 6 (six) hours as needed for wheezing or shortness of breath.   1 Inhaler   2   . amoxicillin (AMOXIL) 875 MG tablet   Oral   Take 1 tablet (875 mg total) by mouth 2 (two) times daily.   20 tablet   0   .  brompheniramine-pseudoephedrine-DM 30-2-10 MG/5ML syrup   Oral   Take 5 mLs by mouth 4 (four) times daily as needed.   120 mL   0   . guaiFENesin (ROBITUSSIN) 100 MG/5ML SOLN   Oral   Take 5 mLs (100 mg total) by mouth every 4 (four) hours as needed for cough or to loosen phlegm.   120 mL   0   . mupirocin ointment (BACTROBAN) 2 %      Apply to affected area 3 times daily   22 g   0   . naproxen (NAPROSYN) 500 MG tablet   Oral   Take 1 tablet (500 mg total) by mouth 2 (two) times daily with a meal.   20 tablet   0   . predniSONE (DELTASONE) 20 MG tablet   Oral   Take 2 tablets (40 mg total) by mouth daily.   8 tablet   0     Allergies Geodon  History reviewed. No pertinent family history.  Social History Social History  Substance Use Topics  . Smoking status: Current Every Day Smoker -- 4.50 packs/day    Types: Cigarettes  . Smokeless tobacco: None  . Alcohol Use: 7.2 oz/week    12 Cans of beer per week    Review of Systems Constitutional: Negative for fever.  Cardiovascular: Chest discomfort, cough Respiratory: Negative for shortness of breath. Gastrointestinal: Negative for abdominal pain Neurological: Scalp burning, ringing in his ears. 10-point ROS otherwise negative.  ____________________________________________   PHYSICAL EXAM:  VITAL SIGNS: ED Triage Vitals  Enc Vitals Group     BP 04/21/16 1609 152/96 mmHg     Pulse Rate 04/21/16 1609 80     Resp 04/21/16 1609 20     Temp 04/21/16 1609 98.4 F (36.9 C)     Temp Source 04/21/16 1609 Oral     SpO2 04/21/16 1609 96 %     Weight 04/21/16 1609 120 lb (54.432 kg)     Height 04/21/16 1609  (1.753 m)     Head Cir --      Peak Flow --      Pain Score 04/21/16 1614 7     Pain Loc --      Pain Edu? --      Excl. in GC? --     Constitutional: Alert and oriented. Nontoxic appearance. Eyes: Normal exam ENT   Head: Normocephalic and atraumatic.   Nose: No  congestion/rhinnorhea.   Mouth/Throat: Mucous membranes are moist.Normal tympanic membranes. Cardiovascular: Normal rate, regular rhythm. No murmur Respiratory: Normal respiratory effort without tachypnea nor retractions. Breath sounds are clear Gastrointestinal: Soft and nontender. No distention. Musculoskeletal: Nontender with normal range of motion in all extremities. No lower extremity edema. Neurologic:  Normal speech and language. No gross focal neurologic deficits Skin:  Skin is warm, dry and intact.  Psychiatric: Very rapid speech, patient is convinced something is going to kill him and that we are missing something. Denies SI or HI.  ____________________________________________     INITIAL IMPRESSION / ASSESSMENT AND PLAN / ED COURSE  Pertinent labs & imaging results that were available during my care of the patient were reviewed by me and considered in my medical decision making (see chart for details).  Patient has been seen in the emergency department today for chest discomfort, believes that we are missing something of that something is going to kill him. Patient appears very anxious, with rapid speech patterns. Patient admits to rapid speech but states he is just angry that we have not been able to diagnose him. I have reviewed the patient's records, he was seen 4/29 [facility] had a normal workup at that time including a negative d-dimer. Patient was seen in the emergency department 04/19/16 again with a normal workup including a CT angiography of the chest. Patient was seen yesterday, with no new symptoms, no additional workup was ordered. Patient returns today frustrated and admits that he is angry but states that we have not figure out what is wrong with him and something is going to kill him. Denies any SI or HI. At this time I do not believe the patient meets criteria for involuntary commitment for psychiatric evaluation. I discussed with the patient repeating lab work today to make  sure nothing has changed. Patient is agreeable to that plan. I have also discussed involving psychiatry for evaluation, patient is agreeable to that plan as well, although as I stated earlier the patient does not meet IVC criteria so this visit would be voluntary. Patient states he has an appointment with his primary psychiatrist next week.  Patient's labs are resulted within normal limits. Patient has been seen by psychiatry, I discussed with the psychiatrist who recommends not taking his last 2 doses of prednisone as this could increase his anxiety, but at this time he  also does not feel that there is anything that would benefit from an inpatient psychiatric admission. Patient has follow-up with his private psychiatrist next week. Patient's labs overall did not show any acute findings. LFTs slightly elevated however they have been significantly more elevated in the past, and the patient has a known history of hepatitis C without any right upper quadrant tenderness. We will discharge the patient home with primary care follow-up as well as psychiatry follow-up. I discussed this with the patient, he is agreeable  ____________________________________________   FINAL CLINICAL IMPRESSION(S) / ED DIAGNOSES  Anxiety Chest discomfort Ear pain   Minna AntisKevin Kaelynne Christley, MD 04/21/16 1756

## 2016-04-21 NOTE — ED Notes (Signed)
Pt speaking very fast, unable to sit still, and states being agitated "because I've been here 4 days in a row and keep missing work".  Results from previous visits have been discussed today with patient but patient still unsatisfied and stating "well what's causing me to feel like this"?  Pt c/o bilateral blurry vision which worsened from just right eye yesterday, c/o left ear being red and hot and feeling "plugged", pt noted to be continuously pulling at ear and occassionally slapping ear.

## 2016-04-21 NOTE — ED Notes (Signed)
Patient ambulatory to triage with steady gait, without difficulty or distress noted, brought in my [employer] deputy; pt just left ED, st "doesn't remember being here or talking to anyone"; reports depression; pt was d/c few hours ago by Dr Lenard LancePaduchowski after having consult with Dr Toni Amendlapacs and cleared for d/c
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
## 2016-04-21 NOTE — ED Notes (Signed)
MD Paduchowski at bedside  

## 2016-04-21 NOTE — ED Notes (Signed)
Pt has been seen in Ed for the past 3 days straight, states he was started on amox and predisone yesterday for sinus infections and bronchitis, states this AM he woke up sweating and feels like his body from the waist up is on fire, pt talking rapidly and loudly stating "I dont know whats wrong but I dont want to die in my bed", pt appears in no acute distress

## 2016-04-22 ENCOUNTER — Inpatient Hospital Stay
Admission: RE | Admit: 2016-04-22 | Discharge: 2016-04-23 | DRG: 885 | Disposition: A | Discharge status: Home / Self Care | Payer: Self-pay | Source: Intra-hospital | Attending: Psychiatry | Admitting: Psychiatry

## 2016-04-22 DIAGNOSIS — F17210 Nicotine dependence, cigarettes, uncomplicated: Secondary | ICD-10-CM | POA: Diagnosis present

## 2016-04-22 DIAGNOSIS — Z792 Long term (current) use of antibiotics: Secondary | ICD-10-CM

## 2016-04-22 DIAGNOSIS — Z818 Family history of other mental and behavioral disorders: Secondary | ICD-10-CM

## 2016-04-22 DIAGNOSIS — B182 Chronic viral hepatitis C: Secondary | ICD-10-CM | POA: Diagnosis present

## 2016-04-22 DIAGNOSIS — R45851 Suicidal ideations: Secondary | ICD-10-CM

## 2016-04-22 DIAGNOSIS — Z888 Allergy status to other drugs, medicaments and biological substances status: Secondary | ICD-10-CM

## 2016-04-22 DIAGNOSIS — F17200 Nicotine dependence, unspecified, uncomplicated: Secondary | ICD-10-CM | POA: Diagnosis present

## 2016-04-22 DIAGNOSIS — Z814 Family history of other substance abuse and dependence: Secondary | ICD-10-CM

## 2016-04-22 DIAGNOSIS — F3163 Bipolar disorder, current episode mixed, severe, without psychotic features: Secondary | ICD-10-CM

## 2016-04-22 DIAGNOSIS — Z9889 Other specified postprocedural states: Secondary | ICD-10-CM

## 2016-04-22 DIAGNOSIS — F1010 Alcohol abuse, uncomplicated: Secondary | ICD-10-CM

## 2016-04-22 DIAGNOSIS — K219 Gastro-esophageal reflux disease without esophagitis: Secondary | ICD-10-CM | POA: Diagnosis present

## 2016-04-22 DIAGNOSIS — Z9114 Patient's other noncompliance with medication regimen: Secondary | ICD-10-CM

## 2016-04-22 DIAGNOSIS — F411 Generalized anxiety disorder: Secondary | ICD-10-CM | POA: Diagnosis present

## 2016-04-22 DIAGNOSIS — Z79899 Other long term (current) drug therapy: Secondary | ICD-10-CM

## 2016-04-22 DIAGNOSIS — Z9119 Patient's noncompliance with other medical treatment and regimen: Secondary | ICD-10-CM

## 2016-04-22 MED ORDER — QUETIAPINE FUMARATE 100 MG PO TABS: 100.0000 mg | ORAL_TABLET | Freq: Every day | ORAL | Status: DC

## 2016-04-22 MED ORDER — GUAIFENESIN ER 600 MG PO TB12
600.0000 mg | ORAL_TABLET | Freq: Two times a day (BID) | ORAL | Status: DC
  Administered 2016-04-22 – 2016-04-23 (×2): 600 mg via ORAL
  Filled 2016-04-22 (×4): qty 1

## 2016-04-22 MED ORDER — LORAZEPAM 2 MG PO TABS
2.0000 mg | ORAL_TABLET | ORAL | Status: AC
  Administered 2016-04-22: 2 mg via ORAL
  Filled 2016-04-22: qty 1

## 2016-04-22 MED ORDER — GUAIFENESIN ER 600 MG PO TB12
600.0000 mg | ORAL_TABLET | Freq: Two times a day (BID) | ORAL | Status: DC
  Administered 2016-04-22: 600 mg via ORAL
  Filled 2016-04-22 (×2): qty 1

## 2016-04-22 MED ORDER — ALUM & MAG HYDROXIDE-SIMETH 200-200-20 MG/5ML PO SUSP: 30.0000 mL | ORAL | Status: DC | PRN

## 2016-04-22 MED ORDER — PANTOPRAZOLE SODIUM 40 MG PO TBEC
40.0000 mg | DELAYED_RELEASE_TABLET | Freq: Every day | ORAL | Status: DC
  Administered 2016-04-22: 40 mg via ORAL
  Filled 2016-04-22: qty 1

## 2016-04-22 MED ORDER — PENICILLIN V POTASSIUM 500 MG PO TABS
500.0000 mg | ORAL_TABLET | Freq: Two times a day (BID) | ORAL | Status: DC
  Administered 2016-04-22: 500 mg via ORAL
  Filled 2016-04-22 (×3): qty 1

## 2016-04-22 MED ORDER — QUETIAPINE FUMARATE 25 MG PO TABS: 100.0000 mg | ORAL_TABLET | Freq: Every day | ORAL | Status: DC

## 2016-04-22 MED ORDER — QUETIAPINE FUMARATE 25 MG PO TABS
50.0000 mg | ORAL_TABLET | Freq: Once | ORAL | Status: DC
  Filled 2016-04-22: qty 2

## 2016-04-22 MED ORDER — PENICILLIN V POTASSIUM 500 MG PO TABS
500.0000 mg | ORAL_TABLET | Freq: Two times a day (BID) | ORAL | Status: DC
  Administered 2016-04-22 – 2016-04-23 (×2): 500 mg via ORAL
  Filled 2016-04-22 (×5): qty 1

## 2016-04-22 MED ORDER — MAGNESIUM HYDROXIDE 400 MG/5ML PO SUSP: 30.0000 mL | Freq: Every day | ORAL | Status: DC | PRN

## 2016-04-22 MED ORDER — ACETAMINOPHEN 325 MG PO TABS
650.0000 mg | ORAL_TABLET | Freq: Four times a day (QID) | ORAL | Status: DC | PRN
  Administered 2016-04-23: 650 mg via ORAL
  Filled 2016-04-22: qty 2

## 2016-04-22 MED ORDER — PANTOPRAZOLE SODIUM 40 MG PO TBEC
40.0000 mg | DELAYED_RELEASE_TABLET | Freq: Every day | ORAL | Status: DC
  Administered 2016-04-23: 40 mg via ORAL
  Filled 2016-04-22: qty 1

## 2016-04-22 NOTE — Progress Notes (Signed)
This writer staffed this patient with [place], [place] it is recommended to refer for inpatient hospitalization.      Braxdon Gappa, MSW, LCSW, LCAS [facility] Triage Specialist [phone] 

## 2016-04-22 NOTE — ED Notes (Signed)
Pt. To BHU from ED ambulatory without difficulty, to room  BHU 4. Report from [place] RN. Pt. Is alert and oriented, warm and dry in no distress. Pt. Denies SI, HI, and AVH. Pt. Calm and cooperative. Pt. Made aware of security cameras and Q15 minute rounds. Pt. Encouraged to let Nursing staff know of any concerns or needs.

## 2016-04-22 NOTE — ED Notes (Signed)
Patient currently denies SI/HI/AVH and pain. He states that he has been feeling extremely congested lately and that he has a tooth abscess that has led to a sinus infection. Additionally, he  Says that he is feeling severely depressed and anxious and that he "doesn't just want to be discharged" and that he wants help. Patient is cooperative, presents with an anxious mood. No signs of any acute distress. Maintained on 15 minute checks and observation by security camera for safety.

## 2016-04-22 NOTE — Progress Notes (Signed)
This Clinical research associatewriter staffed this patient with Karleen [place], [place] it is recommended to refer for inpatient hospitalization.      Maryelizabeth Rowanressa Dyane Broberg, MSW, Clare CharonLCSW, LCAS [facility] Triage Specialist [phone]

## 2016-04-22 NOTE — [facility] Group Notes (Signed)
[facility] Group Notes:  (Nursing/MHT/Case Management/Adjunct)  Date:  04/22/2016  Time:  11:20 PM  Type of Therapy:  Psychoeducational Skills  Participation Level:  Active  Participation Quality:  Appropriate, Attentive and Sharing  Affect:  Appropriate  Cognitive:  Alert, Appropriate and Oriented  Insight:  Good  Engagement in Group:  Engaged  Modes of Intervention:  Discussion and Exploration  Summary of Progress/Problems:  Foy GuadalajaraJasmine R Donella Pascarella 04/22/2016, 11:20 PM

## 2016-04-22 NOTE — Progress Notes (Signed)
Patient with sad affect, cooperative behavior with admission interview and assessment. No SI/HI at this time. Patient states he stopped taking his medications and starting using alcohol and marijuana. Skin check with no wounds or bruises. Skin check performed with no contraband found. Safety maintained.

## 2016-04-22 NOTE — ED Notes (Signed)
Patient given a pitcher of water and instructed to drink in order to stay hydrated. Will continue to monitor for signs and symptoms of dehydration.

## 2016-04-22 NOTE — Tx Team (Signed)
Initial Interdisciplinary Treatment Plan   PATIENT STRESSORS: Medication change or noncompliance Substance abuse   PATIENT STRENGTHS: Capable of independent living Communication skills   PROBLEM LIST: Problem List/Patient Goals Date to be addressed Date deferred Reason deferred Estimated date of resolution  Substance abuse  5/4           Medicine non compliance  5/4                                          DISCHARGE CRITERIA:  Adequate post-discharge living arrangements Motivation to continue treatment in a less acute level of care  PRELIMINARY DISCHARGE PLAN: Attend aftercare/continuing care group Return to previous living arrangement  PATIENT/FAMIILY INVOLVEMENT: This treatment plan has been presented to and reviewed with the patient, Merwyn KatosHarmon [name] Rames, and/or family member, .  The patient and family have been given the opportunity to ask questions and make suggestions.  Ignacia FellingJennifer A Diamond Jentz 04/22/2016, 5:41 PM

## 2016-04-22 NOTE — ED Provider Notes (Signed)
[facility] Emergency Department Provider Note  ____________________________________________  Time seen: 11:30 PM  I have reviewed the triage vital signs and the nursing notes.   HISTORY  Chief Complaint Mental Health Problem      HPI Nathan [name] is a 30 y.o. male with history of bipolar disorder returns to the emergency department with feelings of depression and suicidal ideation. Patient states that he does not recall any of the events of his earlier evaluation emergency Department which occurred at 5:56 PM today. Patient now returns saying that he has persistent suicidal ideations.  Past Medical History  Diagnosis Date  . 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 Chronic headache   . Bipolar 1 disorder [place])     Patient Active Problem List   Diagnosis Date Noted  . 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 Bipolar 1 disorder (HCC) 04/21/2016    Past Surgical History  Procedure Laterality Date  . Tonsillectomy      Current Outpatient Rx  Name  Route  Sig  Dispense  Refill  . albuterol (PROVENTIL HFA;VENTOLIN HFA) 108 (90 BASE) MCG/ACT inhaler   Inhalation   Inhale 2 puffs into the lungs every 6 (six) hours as needed for wheezing or shortness of breath.   1 Inhaler   2   . amoxicillin (AMOXIL) 875 MG tablet   Oral   Take 1 tablet (875 mg total) by mouth 2 (two) times daily.   20 tablet   0   . guaiFENesin (ROBITUSSIN) 100 MG/5ML SOLN   Oral   Take 5 mLs (100 mg total) by mouth every 4 (four) hours as needed for cough or to loosen phlegm.   120 mL   0   . naproxen (NAPROSYN) 500 MG tablet   Oral   Take 1 tablet (500 mg total) by mouth 2 (two) times daily with a meal.   20 tablet   0   . predniSONE (DELTASONE) 20 MG tablet   Oral   Take 2 tablets (40 mg total) by mouth daily.   8 tablet   0     Allergies Geodon  No family history on file.  Social History Social History  Substance Use Topics   . Smoking status: Current Every Day Smoker -- 4.50 packs/day    Types: Cigarettes  . Smokeless tobacco: Not on file  . Alcohol Use: 7.2 oz/week    12 Cans of beer per week    Review of Systems  Constitutional: Negative for fever. Eyes: Negative for visual changes. ENT: Negative for sore throat. Cardiovascular: Negative for chest pain. Respiratory: Negative for shortness of breath. Gastrointestinal: Negative for abdominal pain, vomiting and diarrhea. Genitourinary: Negative for dysuria. Musculoskeletal: Negative for back pain. Skin: Negative for rash. Neurological: Negative for headaches, focal weakness or numbness. Psychiatric:Positive for suicidal ideation  10-point ROS otherwise negative.  ____________________________________________   PHYSICAL EXAM:  VITAL SIGNS: ED Triage Vitals  Enc Vitals Group     BP 04/21/16 2206 136/100 mmHg     Pulse Rate 04/21/16 2206 82     Resp 04/21/16 2206 20     Temp 04/21/16 2206 97.6 F (36.4 C)     Temp Source 04/21/16 2206 Oral     SpO2 04/21/16 2206 99 %     Weight 04/21/16 2206 120 lb (54.432 kg)     Height 04/21/16 2206  (1.753 m)     Head Cir --      Peak  Flow --      Pain Score --      Pain Loc --      Pain Edu? --      Excl. in GC? --      Constitutional: Alert and oriented. Well appearing and in no distress. Eyes: Conjunctivae are normal. PERRL. Normal extraocular movements. ENT   Head: Normocephalic and atraumatic.   Nose: No congestion/rhinnorhea.   Mouth/Throat: Mucous membranes are moist.   Neck: No stridor. Hematological/Lymphatic/Immunilogical: No cervical lymphadenopathy. Cardiovascular: Normal rate, regular rhythm. Normal and symmetric distal pulses are present in all extremities. No murmurs, rubs, or gallops. Respiratory: Normal respiratory effort without tachypnea nor retractions. Breath sounds are clear and equal bilaterally. No wheezes/rales/rhonchi. Gastrointestinal: Soft and  nontender. No distention. There is no CVA tenderness. Genitourinary: deferred Musculoskeletal: Nontender with normal range of motion in all extremities. No joint effusions.  No lower extremity tenderness nor edema. Neurologic:  Normal speech and language. No gross focal neurologic deficits are appreciated. Speech is normal.  Skin:  Skin is warm, dry and intact. No rash noted. Psychiatric: Depressed mood. Speech and behavior are normal. Patient exhibits appropriate insight and judgment.     INITIAL IMPRESSION / ASSESSMENT AND PLAN / ED COURSE  Pertinent labs & imaging results that were available during my care of the patient were reviewed by me and considered in my medical decision making (see chart for details).  Awaiting psychiatric evaluation  ____________________________________________   FINAL CLINICAL IMPRESSION(S) / ED DIAGNOSES  Final diagnoses:  Suicidal ideation      Darci Currentandolph N Brown, MD 04/22/16 [phone]

## 2016-04-22 NOTE — ED Notes (Signed)
Patient currently in room eating lunch. No signs of distress noted at this time. Maintained on 15 minute checks and observation by security camera for safety.

## 2016-04-22 NOTE — ED Notes (Signed)
Patient in room speaking on the phone. Patient received breakfast tray. No current signs of distress noted. Maintained on 15 minute checks and observation by security camera for safety.

## 2016-04-22 NOTE — ED Notes (Signed)
Patient came to nurse and stated that he felt increasingly dizzy. Nurse evaluated vital signs; blood pressure 135/93 and heart rate of 68.  Patient said that an hour ago his urine was brown in color. Patient urinated again and it was amber in color. Physician notified, advised to continue to encourage fluid intake. Maintained on 15 minute checks and observation by security camera for safety.

## 2016-04-22 NOTE — ED Notes (Signed)
Patient remains in dayroom resting. Snack given to patient. No signs of acute distress at this time. Maintained on 15 minute checks and observation by security camera for safety.

## 2016-04-22 NOTE — ED Notes (Signed)
Patient relaxing in dayroom. No signs of distress noted. Maintained on 15 minute checks and observation by security camera for safety.  

## 2016-04-22 NOTE — ED Notes (Signed)
Patient complains of feeling dehydrated and that he has been dehydrated a lot recently. Nurse gave him 3 full cups of water to drink and advised patient to alert nurse if he began to experience other signs of dehydration.

## 2016-04-22 NOTE — ED Notes (Signed)

## 2016-04-22 NOTE — ED Notes (Signed)
ENVIRONMENTAL ASSESSMENT Potentially harmful objects out of patient reach: Yes Personal belongings secured: Yes Patient dressed in hospital provided attire only: Yes Plastic bags out of patient reach: Yes Patient care equipment (cords, cables, call bells, lines, and drains) shortened, removed, or accounted for: Yes Equipment and supplies removed from bottom of stretcher: Yes Potentially toxic materials out of patient reach: Yes Sharps container removed or out of patient reach: Yes  Patient currently in room resting. No signs of distress noted. Maintained on 15 minute checks and observation by security camera for safety.  

## 2016-04-22 NOTE — ED Notes (Signed)
Patient denies SI/HI/AVH and pain. All belongings sent with patient to inpatient unit. Patient consents to treatment on the unit.

## 2016-04-22 NOTE — ED Notes (Signed)
Patient complaining of nasal congestion and requested NS. Advised patient, "I do not have any of that down here, and would have to speak with the ER doctor."  This writer spoke to ER Dr. Magdalene Mollyrder received for mucinex 600 MG po BID to be started at 1000.

## 2016-04-22 NOTE — ED Notes (Signed)
Patient relaxing in dayroom. No signs of distress noted. Maintained on 15 minute checks and observation by security camera for safety.

## 2016-04-22 NOTE — ED Notes (Signed)
Patient being seen by psychiatrist. No signs of distress noted at this time. Maintained on 15 minute checks and observation by security camera for safety.

## 2016-04-22 NOTE — Consult Note (Signed)
[facility] Consult   Reason for Consult:  Consult for 30 year old man with a history of bipolar disorder who presented back to the hospital last night with suicidal thoughts and agitation Referring Physician:  Corky Downs Patient Identification: Nathan [name] MRN:  [id number] Principal Diagnosis: Bipolar 1 disorder, mixed, severe ([place]) Diagnosis:   Patient Active Problem List   Diagnosis Date Noted  . Bipolar 1 disorder, mixed, severe ([place]) [F31.63] 04/22/2016  . Alcohol abuse [F10.10] 04/22/2016  . Suicidal ideation [R45.851] 04/22/2016  . Substance or medication-induced anxiety disorder ([place]) [F19.980] 04/21/2016  . Bipolar 1 disorder ([place]) [F31.9] 04/21/2016    Total Time spent with patient: 1 hour  Subjective:   Nathan [name] is a 30 y.o. male patient admitted with "I'm sorry I lied but I'm really in such a bad place".  HPI:  Patient interviewed. Chart reviewed. Labs reviewed. This 30 year old man was seen yesterday by me in the afternoon in the emergency room and at that time appeared to be hypomanic slightly agitated but not psychotic and very much denying any suicidal ideation. Evidently he left the emergency room and had one beer although he clarifies to me that it was a very large beer and started having suicidal thoughts. He denies doing anything to hurt himself but says he started having wishes to die. He now tells me that the truth is that he's been very depressed recently. Feels negative and hopeless all the time. Feels agitated most of the time. He had stopped taking his Seroquel several weeks ago which she had not revealed to me yesterday. He also had been on prednisone for the last 3 days which I had thought yesterday was the reason for his hypomania. Not sleeping well. Feeling like his mind is out of control.  Social history: Patient lives with his girlfriend and one of his children. He is on disability. Doesn't little bit of side work.  Medical  history: Patient recently had been diagnosed with bronchitis for which she was given amoxicillin and prednisone about 3 days ago. Has a history of alcohol abuse and bipolar disorder and substance abuse problems as well.  Alcohol abuse and benzodiazepine in the use in the past. Marijuana abuse.  Past Psychiatric History: Patient has had several prior hospitalizations although it's Bennett few years since he was in the hospital. Recently he had had multiple visits to the emergency room with lots of somatic complaints. [facility] to [facility] for his outpatient treatment and reveals to me that he had recently stopped taking all of his medicine. He does have history of agitation. No history of suicide attempts  Risk to Self: Suicidal Ideation: Yes-Currently Present Suicidal Intent: Yes-Currently Present Is patient at risk for suicide?: Yes Suicidal Plan?: Yes-Currently Present Specify Current Suicidal Plan: with knife Access to Means: Yes Specify Access to Suicidal Means: went in the woods with a knife What has been your use of drugs/alcohol within the last 12 months?: Alcohol, THC How many times?: 10 Other Self Harm Risks: reports of none Triggers for Past Attempts: Other (Comment), Other personal contacts Intentional Self Injurious Behavior: Cutting Comment - Self Injurious Behavior: last time was over 3 years Risk to Others: Homicidal Ideation: No-Not Currently/Within Last 6 Months Thoughts of Harm to Others: No-Not Currently Present/Within Last 6 Months Current Homicidal Intent: No-Not Currently/Within Last 6 Months Current Homicidal Plan: No-Not Currently/Within Last 6 Months Access to Homicidal Means: No Identified Victim: reports of none History of harm to others?: No Assessment of Violence: None  Noted Violent Behavior Description: reports of none Does patient have access to weapons?: Yes (Comment) (knife) Criminal Charges Pending?: No Does patient have a court date: No Prior Inpatient  Therapy: Prior Inpatient Therapy: Yes Prior Therapy Dates: unable to remember Prior Therapy Facilty/Provider(s): [facility], [place], Some in [place] and [place] Reason for Treatment: Suicide Attempts, Depression & Substance Use Prior Outpatient Therapy: Prior Outpatient Therapy: Yes Prior Therapy Dates: Current Prior Therapy Facilty/Provider(s): [facility] Reason for Treatment: Bipolar, Manic Does patient have an ACCT team?: No Does patient have Intensive In-House Services?  : No Does patient have [facility] services? : No Does patient have P4CC services?: No  Past Medical History:  Past Medical History  Diagnosis Date  . Hep C w/o coma, chronic ([place])   . Chronic headache   . Bipolar 1 disorder [facility])     Past Surgical History  Procedure Laterality Date  . Tonsillectomy     Family History: No family history on file. Family Psychiatric  History: Family history positive for mood instability and multiple members of his family Social History:  History  Alcohol Use  . 7.2 oz/week  . 12 Cans of beer per week     History  Drug Use  . Yes  . Special: Marijuana    Social History   Social History  . Marital Status: Single    Spouse Name: N/A  . Number of Children: N/A  . Years of Education: N/A   Social History Main Topics  . Smoking status: Current Every Day Smoker -- 4.50 packs/day    Types: Cigarettes  . Smokeless tobacco: Not on file  . Alcohol Use: 7.2 oz/week    12 Cans of beer per week  . Drug Use: Yes    Special: Marijuana  . Sexual Activity: Not on file   Other Topics Concern  . Not on file   Social History Narrative   Additional Social History:    Allergies:   Allergies  Allergen Reactions  . Geodon [Ziprasidone Hcl]     Labs:  Results for orders placed or performed during the hospital encounter of 04/21/16 (from the past 48 hour(s))  CBC with Differential     Status: None   Collection Time: 04/21/16  5:06 PM  Result Value Ref Range   WBC 7.1 3.8 - 10.6  K/uL   RBC 5.13 4.40 - 5.90 MIL/uL   Hemoglobin 16.3 13.0 - 18.0 g/dL   HCT 47.8 40.0 - 52.0 %   MCV 93.3 80.0 - 100.0 fL   MCH 31.8 26.0 - 34.0 pg   MCHC 34.1 32.0 - 36.0 g/dL   RDW 14.4 11.5 - 14.5 %   Platelets 292 150 - 440 K/uL   Neutrophils Relative % 79% %   Neutro Abs 5.6 1.4 - 6.5 K/uL   Lymphocytes Relative 16% %   Lymphs Abs 1.2 1.0 - 3.6 K/uL   Monocytes Relative 5% %   Monocytes Absolute 0.3 0.2 - 1.0 K/uL   Eosinophils Relative 0% %   Eosinophils Absolute 0.0 0 - 0.7 K/uL   Basophils Relative 0% %   Basophils Absolute 0.0 0 - 0.1 K/uL  Comprehensive metabolic panel     Status: Abnormal   Collection Time: 04/21/16  5:06 PM  Result Value Ref Range   Sodium 135 135 - 145 mmol/L   Potassium 4.9 3.5 - 5.1 mmol/L   Chloride 98 (L) 101 - 111 mmol/L   CO2 23 22 - 32 mmol/L   Glucose, Bld 103 (  H) 65 - 99 mg/dL   BUN 10 6 - 20 mg/dL   Creatinine, Ser 0.70 0.61 - 1.24 mg/dL   Calcium 10.3 8.9 - 10.3 mg/dL   Total Protein 9.2 (H) 6.5 - 8.1 g/dL   Albumin 5.4 (H) 3.5 - 5.0 g/dL   AST 84 (H) 15 - 41 U/L   ALT 106 (H) 17 - 63 U/L   Alkaline Phosphatase 60 38 - 126 U/L   Total Bilirubin 1.3 (H) 0.3 - 1.2 mg/dL   GFR calc non Af Amer >60 >60 mL/min   GFR calc Af Amer >60 >60 mL/min    Comment: (NOTE) The eGFR has been calculated using the CKD EPI equation. This calculation has not been validated in all clinical situations. eGFR's persistently <60 mL/min signify possible Chronic Kidney Disease.    Anion gap 14 5 - 15  Troponin I     Status: None   Collection Time: 04/21/16  5:06 PM  Result Value Ref Range   Troponin I <0.03 <0.031 ng/mL    Comment:        NO INDICATION OF MYOCARDIAL INJURY.   Urinalysis complete, with microscopic ([facility] only)     Status: Abnormal   Collection Time: 04/21/16  5:06 PM  Result Value Ref Range   Color, Urine STRAW (A) YELLOW   APPearance CLEAR (A) CLEAR   Glucose, UA NEGATIVE NEGATIVE mg/dL   Bilirubin Urine NEGATIVE NEGATIVE    Ketones, ur NEGATIVE NEGATIVE mg/dL   Specific Gravity, Urine 1.002 (L) 1.005 - 1.030   Hgb urine dipstick NEGATIVE NEGATIVE   pH 6.0 5.0 - 8.0   Protein, ur NEGATIVE NEGATIVE mg/dL   Nitrite NEGATIVE NEGATIVE   Leukocytes, UA NEGATIVE NEGATIVE   RBC / HPF 0-5 0 - 5 RBC/hpf   WBC, UA 0-5 0 - 5 WBC/hpf   Bacteria, UA NONE SEEN NONE SEEN   Squamous Epithelial / LPF NONE SEEN NONE SEEN    Current Facility-Administered Medications  Medication Dose Route Frequency Provider Last Rate Last Dose  . guaiFENesin (MUCINEX) 12 hr tablet 600 mg  600 mg Oral BID Gregor Hams, MD   600 mg at 04/22/16 1006  . pantoprazole (PROTONIX) EC tablet 40 mg  40 mg Oral Daily Gonzella Lex, MD   40 mg at 04/22/16 1258  . penicillin v potassium (VEETID) tablet 500 mg  500 mg Oral BID Lavonia Drafts, MD   500 mg at 04/22/16 0824  . QUEtiapine (SEROQUEL) tablet 100 mg  100 mg Oral QHS Gonzella Lex, MD      . QUEtiapine (SEROQUEL) tablet 50 mg  50 mg Oral Once Gonzella Lex, MD   50 mg at 04/22/16 1300   Current Outpatient Prescriptions  Medication Sig Dispense Refill  . albuterol (PROVENTIL HFA;VENTOLIN HFA) 108 (90 BASE) MCG/ACT inhaler Inhale 2 puffs into the lungs every 6 (six) hours as needed for wheezing or shortness of breath. 1 Inhaler 2  . amoxicillin (AMOXIL) 875 MG tablet Take 1 tablet (875 mg total) by mouth 2 (two) times daily. 20 tablet 0  . guaiFENesin (ROBITUSSIN) 100 MG/5ML SOLN Take 5 mLs (100 mg total) by mouth every 4 (four) hours as needed for cough or to loosen phlegm. 120 mL 0  . naproxen (NAPROSYN) 500 MG tablet Take 1 tablet (500 mg total) by mouth 2 (two) times daily with a meal. 20 tablet 0  . predniSONE (DELTASONE) 20 MG tablet Take 2 tablets (40 mg total) by mouth daily.  8 tablet 0    Musculoskeletal: Strength & Muscle Tone: within normal limits Gait & Station: normal Patient leans: N/A  Psychiatric Specialty Exam: Review of Systems  Constitutional: Negative.   HENT:  Negative.   Eyes: Negative.   Respiratory: Negative.   Cardiovascular: Negative.   Gastrointestinal: Negative.   Musculoskeletal: Negative.   Skin: Negative.   Neurological: Negative.   Psychiatric/Behavioral: Positive for depression, suicidal ideas and substance abuse. Negative for hallucinations and memory loss. The patient is nervous/anxious and has insomnia.     Blood pressure 132/79, pulse 65, temperature 97.9 F (36.6 C), temperature source Oral, resp. rate 18, height '5\' 9"'  (1.753 m), weight 54.432 kg (120 lb), SpO2 100 %.Body mass index is 17.71 kg/(m^2).  General Appearance: Casual  Eye Contact::  Good  Speech:  Clear and Coherent  Volume:  Increased  Mood:  Dysphoric  Affect:  Labile and Tearful  Thought Process:  Tangential  Orientation:  Full (Time, Place, and Person)  Thought Content:  Negative  Suicidal Thoughts:  Yes.  without intent/plan  Homicidal Thoughts:  No  Memory:  Immediate;   Good Recent;   Fair Remote;   Fair  Judgement:  Fair  Insight:  Fair  Psychomotor Activity:  Decreased  Concentration:  Fair  Recall:  [employer] of Knowledge:Fair  Language: Fair  Akathisia:  No  Handed:  Right  AIMS (if indicated):     Assets:  Communication Skills Desire for Improvement Financial Resources/Insurance Housing Social Support Talents/Skills  ADL's:  Intact  Cognition: WNL  Sleep:      Treatment Plan Summary: Daily contact with patient to assess and evaluate symptoms and progress in treatment, Medication management and Plan Patient presents back again now with tearful mood but still labile. Appears to be in a mixed manic episode. Active suicidal ideation. Not taking his medicine. Much more out of control than he presented yesterday. We will admit him to the psychiatric ward in the hospital. Restart Seroquel. 1 time dose of Ativan and some when necessary Ativan here in the emergency room just to keep him calm. Continuous observation downstairs. Check labs  including lipid panel and hemoglobin A1c prolactin level and TSH. Patient agrees to the plan.  Disposition: Recommend psychiatric Inpatient admission when medically cleared. Supportive therapy provided about ongoing stressors.  Alethia Berthold, MD 04/22/2016 4:05 PM

## 2016-04-23 DIAGNOSIS — F3163 Bipolar disorder, current episode mixed, severe, without psychotic features: Principal | ICD-10-CM

## 2016-04-23 LAB — LIPID PANEL
CHOLESTEROL: 193 mg/dL (ref 0–200)
HDL: 69 mg/dL (ref >40)
LDL Cholesterol: 105 mg/dL — ABNORMAL HIGH (ref 0–99)
TRIGLYCERIDES: 94 mg/dL (ref <150)
Total CHOL/HDL Ratio: 2.8 RATIO
VLDL: 19 mg/dL (ref 0–40)

## 2016-04-23 LAB — TSH: TSH: 2.717 u[IU]/mL (ref 0.350–4.500)

## 2016-04-23 LAB — HEMOGLOBIN A1C: HEMOGLOBIN A1C: 5 % (ref 4.0–6.0)

## 2016-04-23 MED ORDER — ALBUTEROL SULFATE HFA 108 (90 BASE) MCG/ACT IN AERS: 2.0000 | INHALATION_SPRAY | Freq: Four times a day (QID) | RESPIRATORY_TRACT | Status: DC | PRN

## 2016-04-23 MED ORDER — QUETIAPINE FUMARATE 25 MG PO TABS: 50.0000 mg | ORAL_TABLET | Freq: Every day | ORAL | Status: DC

## 2016-04-23 MED ORDER — GUAIFENESIN ER 600 MG PO TB12: 600.0000 mg | ORAL_TABLET | Freq: Two times a day (BID) | ORAL | Status: DC

## 2016-04-23 MED ORDER — PANTOPRAZOLE SODIUM 40 MG PO TBEC: 40.0000 mg | DELAYED_RELEASE_TABLET | Freq: Every day | ORAL | Status: DC

## 2016-04-23 MED ORDER — HYDROXYZINE HCL 50 MG PO TABS
50.0000 mg | ORAL_TABLET | Freq: Three times a day (TID) | ORAL | Status: DC | PRN
  Administered 2016-04-23: 50 mg via ORAL
  Filled 2016-04-23: qty 1

## 2016-04-23 MED ORDER — QUETIAPINE FUMARATE 50 MG PO TABS: 50.0000 mg | ORAL_TABLET | Freq: Every day | ORAL | Status: DC

## 2016-04-23 MED ORDER — HYDROXYZINE HCL 50 MG PO TABS: 50.0000 mg | ORAL_TABLET | Freq: Three times a day (TID) | ORAL | Status: DC | PRN

## 2016-04-23 MED ORDER — PENICILLIN V POTASSIUM 500 MG PO TABS: 500.0000 mg | ORAL_TABLET | Freq: Two times a day (BID) | ORAL | Status: DC

## 2016-04-23 NOTE — Progress Notes (Signed)
Patient denies SI/HI, denies A/V hallucinations. Patient verbalizes understanding of discharge instructions, follow up care and prescriptions. Patient given all belongings from  locker. Patient escorted out by staff, transported by family. 

## 2016-04-23 NOTE — Progress Notes (Addendum)
Patient ID: Nathan [name], male   DOB: 06/22/1986, 30 y.o.   MRN: [id number] Pt was admitted after the last tx team meeting on 5/4 attended by the CSW and was discharged before the next tx team mtg on 04/27/16 and thus a tx team note was not needed.  The pt was admitted and discharged in less than 24 hours and thus, a PSA was not required.   Dorothe PeaJonathan F. Faustina Gebert, LCSWA, LCAS  04/23/16

## 2016-04-23 NOTE — H&P (Signed)
Psychiatric Admission Assessment Adult  Patient Identification: Nathan [name] MRN:  [id number] Date of Evaluation:  04/23/2016 Chief Complaint:  Bipolar Principal Diagnosis: Bipolar 1 disorder, mixed, severe ([place]) Diagnosis:   Patient Active Problem List   Diagnosis Date Noted  . Bipolar 1 disorder, mixed, severe ([place]) [F31.63] 04/22/2016  . Alcohol use disorder, mild, abuse [F10.10] 04/22/2016  . Suicidal ideation [R45.851] 04/22/2016  . Tobacco use disorder [F17.200] 04/22/2016  . Substance or medication-induced anxiety disorder [facility]) [F19.980] 04/21/2016   History of Present Illness:  Identifying data. Nathan [name] is a 30 year old male with a history of depression, anxiety, mood instability and substance use.  Chief complaint. "I was freaking out."  History of present illness. Information was obtained from the patient and the chart. The patient has a long history of depression and mood instability. He has been a patient of Dr. Randel Books at [facility] and responded well to Seroquel. In the past several weeks however he did not take his medication. He recently has not been feeling well physically and went to the emergency room where she was diagnosed with bronchitis. Mucinex penicillin and prednisone were prescribed. The patient developed severe anxiety, restlessness, agitation from prednisone. He felt panicky and out of control. He felt that he was dying and return to the hospital where he complained of feeling suicidal. He was admitted to psychiatry. On the interview today the patient adamantly denies any symptoms of depression or psychosis. He is no longer suicidal homicidal. He feels somewhat anxious but does not want to take any controlled substances. I'm not this patient well. He has a history of substance use but has been clean of substances past he completed several residential treatment programs that were court ordered. He only drinks a beer or 2 on occasion but has been with to abstain  from substances. He makes it very clear that he would not accept cough syrup with narcotics or benzodiazepines.  Past psychiatric history. Long history of bipolar illness with noncompliance. Long history of substance abuse. He recently he has been doing well in the care of Dr. Randel Books. There were several suicide attempts.  Family psychiatric history. Father with depression and substance use and brother with substance use.   Social history. He now lives with his girlfriend and the baby son. He is employed. He stays away from his father and his brother who are always putting him in trouble. He has a very supportive mother.  Total Time spent with patient: 1 hour  Past Psychiatric History: Bipolar disorder, substance use   Is the patient at risk to self? No.  Has the patient been a risk to self in the past 6 months? No.  Has the patient been a risk to self within the distant past? Yes.    Is the patient a risk to others? No.  Has the patient been a risk to others in the past 6 months? No.  Has the patient been a risk to others within the distant past? No.   Prior Inpatient Therapy:   Prior Outpatient Therapy:    Alcohol Screening: 1. How often do you have a drink containing alcohol?: 2 to 3 times a week 2. How many drinks containing alcohol do you have on a typical day when you are drinking?: 3 or 4 3. How often do you have six or more drinks on one occasion?: Less than monthly Preliminary Score: 2 4. How often during the last year have you found that you were not able to stop drinking  once you had started?: Less than monthly 5. How often during the last year have you failed to do what was normally expected from you becasue of drinking?: Less than monthly 6. How often during the last year have you needed a first drink in the morning to get yourself going after a heavy drinking session?: Less than monthly 7. How often during the last year have you had a feeling of guilt of remorse after  drinking?: Less than monthly 8. How often during the last year have you been unable to remember what happened the night before because you had been drinking?: Less than monthly 9. Have you or someone else been injured as a result of your drinking?: No 10. Has a relative or friend or a doctor or another health worker been concerned about your drinking or suggested you cut down?: No Alcohol Use Disorder Identification Test Final Score (AUDIT): 10 Brief Intervention: Patient declined brief intervention Substance Abuse History in the last 12 months:  No. Consequences of Substance Abuse: NA Previous Psychotropic Medications: Yes  Psychological Evaluations: No  Past Medical History:  Past Medical History  Diagnosis Date  . Hep C w/o coma, chronic ([place])   . Chronic headache   . Bipolar 1 disorder [facility])     Past Surgical History  Procedure Laterality Date  . Tonsillectomy     Family History: History reviewed. No pertinent family history. Family Psychiatric  History: Depression, substance use.co Screening: '@FLOW' ([phone])::1)@ Social History:  History  Alcohol Use  . 7.2 oz/week  . 12 Cans of beer per week     History  Drug Use  . Yes  . Special: Marijuana    Additional Social History:                           Allergies:   Allergies  Allergen Reactions  . Geodon [Ziprasidone Hcl]    Lab Results:  Results for orders placed or performed during the hospital encounter of 04/21/16 (from the past 48 hour(s))  CBC with Differential     Status: None   Collection Time: 04/21/16  5:06 PM  Result Value Ref Range   WBC 7.1 3.8 - 10.6 K/uL   RBC 5.13 4.40 - 5.90 MIL/uL   Hemoglobin 16.3 13.0 - 18.0 g/dL   HCT 47.8 40.0 - 52.0 %   MCV 93.3 80.0 - 100.0 fL   MCH 31.8 26.0 - 34.0 pg   MCHC 34.1 32.0 - 36.0 g/dL   RDW 14.4 11.5 - 14.5 %   Platelets 292 150 - 440 K/uL   Neutrophils Relative % 79% %   Neutro Abs 5.6 1.4 - 6.5 K/uL   Lymphocytes Relative 16% %   Lymphs  Abs 1.2 1.0 - 3.6 K/uL   Monocytes Relative 5% %   Monocytes Absolute 0.3 0.2 - 1.0 K/uL   Eosinophils Relative 0% %   Eosinophils Absolute 0.0 0 - 0.7 K/uL   Basophils Relative 0% %   Basophils Absolute 0.0 0 - 0.1 K/uL  Comprehensive metabolic panel     Status: Abnormal   Collection Time: 04/21/16  5:06 PM  Result Value Ref Range   Sodium 135 135 - 145 mmol/L   Potassium 4.9 3.5 - 5.1 mmol/L   Chloride 98 (L) 101 - 111 mmol/L   CO2 23 22 - 32 mmol/L   Glucose, Bld 103 (H) 65 - 99 mg/dL   BUN 10 6 - 20 mg/dL   Creatinine,  Ser 0.70 0.61 - 1.24 mg/dL   Calcium 10.3 8.9 - 10.3 mg/dL   Total Protein 9.2 (H) 6.5 - 8.1 g/dL   Albumin 5.4 (H) 3.5 - 5.0 g/dL   AST 84 (H) 15 - 41 U/L   ALT 106 (H) 17 - 63 U/L   Alkaline Phosphatase 60 38 - 126 U/L   Total Bilirubin 1.3 (H) 0.3 - 1.2 mg/dL   GFR calc non Af Amer >60 >60 mL/min   GFR calc Af Amer >60 >60 mL/min    Comment: (NOTE) The eGFR has been calculated using the CKD EPI equation. This calculation has not been validated in all clinical situations. eGFR's persistently <60 mL/min signify possible Chronic Kidney Disease.    Anion gap 14 5 - 15  Troponin I     Status: None   Collection Time: 04/21/16  5:06 PM  Result Value Ref Range   Troponin I <0.03 <0.031 ng/mL    Comment:        NO INDICATION OF MYOCARDIAL INJURY.   Urinalysis complete, with microscopic ([facility] only)     Status: Abnormal   Collection Time: 04/21/16  5:06 PM  Result Value Ref Range   Color, Urine STRAW (A) YELLOW   APPearance CLEAR (A) CLEAR   Glucose, UA NEGATIVE NEGATIVE mg/dL   Bilirubin Urine NEGATIVE NEGATIVE   Ketones, ur NEGATIVE NEGATIVE mg/dL   Specific Gravity, Urine 1.002 (L) 1.005 - 1.030   Hgb urine dipstick NEGATIVE NEGATIVE   pH 6.0 5.0 - 8.0   Protein, ur NEGATIVE NEGATIVE mg/dL   Nitrite NEGATIVE NEGATIVE   Leukocytes, UA NEGATIVE NEGATIVE   RBC / HPF 0-5 0 - 5 RBC/hpf   WBC, UA 0-5 0 - 5 WBC/hpf   Bacteria, UA NONE SEEN NONE SEEN    Squamous Epithelial / LPF NONE SEEN NONE SEEN    Blood Alcohol level:  Lab Results  Component Value Date   ETH <5 04/19/2016   ETH 238* 03/70/4888    Metabolic Disorder Labs:  No results found for: HGBA1C, MPG No results found for: PROLACTIN No results found for: CHOL, TRIG, HDL, CHOLHDL, VLDL, LDLCALC  Current Medications: Current Facility-Administered Medications  Medication Dose Route Frequency Provider Last Rate Last Dose  . acetaminophen (TYLENOL) tablet 650 mg  650 mg Oral Q6H PRN Gonzella Lex, MD      . alum & mag hydroxide-simeth (MAALOX/MYLANTA) 200-200-20 MG/5ML suspension 30 mL  30 mL Oral Q4H PRN Gonzella Lex, MD      . guaiFENesin (MUCINEX) 12 hr tablet 600 mg  600 mg Oral BID Gonzella Lex, MD   600 mg at 04/23/16 9169  . hydrOXYzine (ATARAX/VISTARIL) tablet 50 mg  50 mg Oral TID PRN Clovis Fredrickson, MD   50 mg at 04/23/16 1212  . magnesium hydroxide (MILK OF MAGNESIA) suspension 30 mL  30 mL Oral Daily PRN Gonzella Lex, MD      . pantoprazole (PROTONIX) EC tablet 40 mg  40 mg Oral Daily Gonzella Lex, MD   40 mg at 04/23/16 0646  . penicillin v potassium (VEETID) tablet 500 mg  500 mg Oral BID Gonzella Lex, MD   500 mg at 04/23/16 4503  . QUEtiapine (SEROQUEL) tablet 50 mg  50 mg Oral QHS Jolanta B Pucilowska, MD       PTA Medications: Prescriptions prior to admission  Medication Sig Dispense Refill Last Dose  . albuterol (PROVENTIL HFA;VENTOLIN HFA) 108 (90 BASE) MCG/ACT inhaler Inhale  2 puffs into the lungs every 6 (six) hours as needed for wheezing or shortness of breath. 1 Inhaler 2 prn at prn  . amoxicillin (AMOXIL) 875 MG tablet Take 1 tablet (875 mg total) by mouth 2 (two) times daily. 20 tablet 0 04/21/2016 at Unknown time  . guaiFENesin (ROBITUSSIN) 100 MG/5ML SOLN Take 5 mLs (100 mg total) by mouth every 4 (four) hours as needed for cough or to loosen phlegm. 120 mL 0 04/21/2016 at Unknown time  . naproxen (NAPROSYN) 500 MG tablet Take 1 tablet  (500 mg total) by mouth 2 (two) times daily with a meal. 20 tablet 0 04/21/2016 at Unknown time  . predniSONE (DELTASONE) 20 MG tablet Take 2 tablets (40 mg total) by mouth daily. 8 tablet 0 04/21/2016 at Unknown time    Musculoskeletal: Strength & Muscle Tone: within normal limits Gait & Station: normal Patient leans: N/A  Psychiatric Specialty Exam: Physical Exam  Nursing note and vitals reviewed. Constitutional: He is oriented to person, place, and time. He appears well-developed and well-nourished.  HENT:  Head: Normocephalic and atraumatic.  Eyes: Conjunctivae and EOM are normal. Pupils are equal, round, and reactive to light.  Neck: Normal range of motion. Neck supple.  Cardiovascular: Normal rate, regular rhythm and normal heart sounds.   Respiratory: Effort normal and breath sounds normal.  GI: Soft. Bowel sounds are normal.  Musculoskeletal: Normal range of motion.  Neurological: He is alert and oriented to person, place, and time.  Skin: Skin is warm and dry.    Review of Systems  Respiratory: Positive for cough.   Psychiatric/Behavioral: The patient is nervous/anxious.   All other systems reviewed and are negative.   Blood pressure 124/86, pulse 67, temperature 98 F (36.7 C), temperature source Oral, resp. rate 20, height '5\' 9"'  (1.753 m), weight 54.432 kg (120 lb), SpO2 100 %.Body mass index is 17.71 kg/(m^2).  See SRA.                                                  Sleep:  Number of Hours: 7.5     Treatment Plan Summary: Daily contact with patient to assess and evaluate symptoms and progress in treatment and Medication management   Mr. Cullens is a 30 year old male with history of bipolar disorder admitted for suicidal medication noncompliance.  1. Suicidal ideation. The patient adamantly denies any thoughts, intentions, or plans to hurt himself or others. He is able to contract for safety. He is forward thinking and optimistic about the  future. He is a loving father and son.  2. Mood. He was restarted on Seroquel in the emergency room but wants to take 50 mg at bedtime only.  3. Anxiety. We started Vistaril.  4. GERD. He is on Protonix.  5. Bronchitis. We continue Mucinex and Penicillin as prescribed by emergency room physician.  6. Smoking. He declined a nicotine patch.  7. Alcohol abuse. The patient has a long history of substance use but has been clean of substances lately except for occasional beer. He did not need alcohol detox. Vital signs were stable.  8. Disposition. He was discharged to home with his family. He will follow up with Dr. Randel Books at [facility].      Observation Level/Precautions:  15 minute checks  Laboratory:  CBC Chemistry Profile UDS UA  Psychotherapy:    Medications:  Consultations:    Discharge Concerns:    Estimated LOS:  Other:     I certify that inpatient services furnished can reasonably be expected to improve the patient's condition.    Orson Slick, MD 5/5/201712:24 PM

## 2016-04-23 NOTE — Discharge Summary (Signed)
Physician Discharge Summary Note  Patient:  Nathan [name] is an 30 y.o., male MRN:  [id number] DOB:  July 26, 1986 Patient phone:  [phone] (home)  Patient address:   [address] [place] [address],  Total Time spent with patient: 1 hour  Date of Admission:  04/22/2016 Date of Discharge: 04/23/2016  Reason for Admission:  Anxiety, suicidal ideation.  Identifying data. [name] is a 30 year old male with a history of depression, anxiety, mood instability and substance use.  Chief complaint. "I was freaking out."  History of present illness. Information was obtained from the patient and the chart. The patient has a long history of depression and mood instability. He has been a patient of Dr. Suzie Portela at [facility] and responded well to Seroquel. In the past several weeks however he did not take his medication. He recently has not been feeling well physically and went to the emergency room where she was diagnosed with bronchitis. Mucinex penicillin and prednisone were prescribed. The patient developed severe anxiety, restlessness, agitation from prednisone. He felt panicky and out of control. He felt that he was dying and return to the hospital where he complained of feeling suicidal. He was admitted to psychiatry. On the interview today the patient adamantly denies any symptoms of depression or psychosis. He is no longer suicidal homicidal. He feels somewhat anxious but does not want to take any controlled substances. I'm not this patient well. He has a history of substance use but has been clean of substances past he completed several residential treatment programs that were court ordered. He only drinks a beer or 2 on occasion but has been with to abstain from substances. He makes it very clear that he would not accept cough syrup with narcotics or benzodiazepines.  Past psychiatric history. Long history of bipolar illness with noncompliance. Long history of substance abuse. He recently he  has been doing well in the care of Dr. Suzie Portela. There were several suicide attempts.  Family psychiatric history. Father with depression and substance use and brother with substance use.   Social history. He now lives with his girlfriend and the baby son. He is employed. He stays away from his father and his brother who are always putting him in trouble. He has a very supportive mother.   Principal Problem: Bipolar 1 disorder, mixed, severe [facility]) Discharge Diagnoses: Patient Active Problem List   Diagnosis Date Noted  . Bipolar 1 disorder, mixed, severe ([facility]) [F31.63] 04/22/2016  . Alcohol use disorder, mild, abuse [F10.10] 04/22/2016  . Suicidal ideation [R45.851] 04/22/2016  . Tobacco use disorder [F17.200] 04/22/2016  . Substance or medication-induced anxiety disorder [facility]) [F19.980] 04/21/2016    Past Psychiatric History: Depression, mood instability, anxiety, substance use.  Past Medical History:  Past Medical History  Diagnosis Date  . 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 Chronic headache   . Bipolar 1 disorder [facility])     Past Surgical History  Procedure Laterality Date  . Tonsillectomy     Family History: History reviewed. No pertinent family history. Family Psychiatric  History: Depression, substance use.  Social History:  History  Alcohol Use  . 7.2 oz/week  . 12 Cans of beer per week     History  Drug Use  . Yes  . Special: Marijuana    Social History   Social History  . Marital Status: Single    Spouse Name: N/A  . Number of Children: N/A  . Years of Education: N/A   Social  History Main Topics  . Smoking status: Current Every Day Smoker -- 4.50 packs/day    Types: Cigarettes  . Smokeless tobacco: None  . Alcohol Use: 7.2 oz/week    12 Cans of beer per week  . Drug Use: Yes    Special: Marijuana  . Sexual Activity: Not Asked   Other Topics Concern  . None   Social History Narrative    Hospital Course:    Mr. Colledge is a 30 year old male with  history of bipolar disorder admitted for suicidal medication noncompliance.  1. Suicidal ideation. The patient adamantly denies any thoughts, intentions, or plans to hurt himself or others. He is able to contract for safety. He is forward thinking and optimistic about the future. He is a loving father and son.  2. Mood. He was restarted on Seroquel in the emergency room but wants to take 50 mg at bedtime only.  3. Anxiety. We started Vistaril.  4. GERD. He is on Protonix.  5. Bronchitis. We continue Mucinex and Penicillin as prescribed by emergency room physician.  6. Smoking. He declined a nicotine patch.  7. Alcohol abuse. The patient has a long history of substance use but has been clean of substances lately except for occasional beer. He did not need alcohol detox. Vital signs were stable.  8. Metabolic syndrome screening. Lipid profile, hemoglobin A1c, TSH, prolactin are pending.   9. Disposition. He was discharged to home with his family. He will follow up with Dr. Suzie Portela at [facility].     Physical Findings: AIMS:  , ,  ,  ,    CIWA:    COWS:     Musculoskeletal: Strength & Muscle Tone: within normal limits Gait & Station: normal Patient leans: N/A  Psychiatric Specialty Exam: Review of Systems  Respiratory: Positive for cough.   Psychiatric/Behavioral: The patient is nervous/anxious.   All other systems reviewed and are negative.   Blood pressure 124/86, pulse 67, temperature 98 F (36.7 C), temperature source Oral, resp. rate 20, height  (1.753 m), weight 54.432 kg (120 lb), SpO2 100 %.Body mass index is 17.71 kg/(m^2).  See SRA.                                                  Sleep:  Number of Hours: 7.5   Have you used any form of tobacco in the last 30 days? (Cigarettes, Smokeless Tobacco, Cigars, and/or Pipes): Yes  Has this patient used any form of tobacco in the last 30 days? (Cigarettes, Smokeless Tobacco, Cigars, and/or Pipes) Yes,  Yes, A prescription for an FDA-approved tobacco cessation medication was offered at discharge and the patient refused  Blood Alcohol level:  Lab Results  Component Value Date   [facility] <5 04/19/2016   ETH 238* 09/05/2015    Metabolic Disorder Labs:  No results found for: HGBA1C, MPG No results found for: PROLACTIN No results found for: CHOL, TRIG, HDL, CHOLHDL, VLDL, LDLCALC  See Psychiatric Specialty Exam and Suicide Risk Assessment completed by Attending Physician prior to discharge.  Discharge destination:  Home  Is patient on multiple antipsychotic therapies at discharge:  No   Has Patient had three or more failed trials of antipsychotic monotherapy by history:  No  Recommended Plan for Multiple Antipsychotic Therapies: NA  Discharge Instructions    Diet - low sodium heart healthy  Complete by:  As directed      Increase activity slowly    Complete by:  As directed             Medication List    STOP taking these medications        amoxicillin 875 MG tablet  Commonly known as:  AMOXIL     guaiFENesin 100 MG/5ML Soln  Commonly known as:  ROBITUSSIN  Replaced by:  guaiFENesin 600 MG 12 hr tablet     naproxen 500 MG tablet  Commonly known as:  NAPROSYN     predniSONE 20 MG tablet  Commonly known as:  DELTASONE      TAKE these medications      Indication   albuterol 108 (90 Base) MCG/ACT inhaler  Commonly known as:  PROVENTIL HFA;VENTOLIN HFA  Inhale 2 puffs into the lungs every 6 (six) hours as needed for wheezing or shortness of breath.   Indication:  Disease involving Spasms of the Lungs     guaiFENesin 600 MG 12 hr tablet  Commonly known as:  MUCINEX  Take 1 tablet (600 mg total) by mouth 2 (two) times daily.   Indication:  Cough     hydrOXYzine 50 MG tablet  Commonly known as:  ATARAX/VISTARIL  Take 1 tablet (50 mg total) by mouth 3 (three) times daily as needed for anxiety.   Indication:  Anxiety Neurosis     pantoprazole 40 MG tablet  Commonly  known as:  PROTONIX  Take 1 tablet (40 mg total) by mouth daily.   Indication:  Conditions of Excess Stomach Acid Secretion     penicillin v potassium 500 MG tablet  Commonly known as:  VEETID  Take 1 tablet (500 mg total) by mouth 2 (two) times daily.   Indication:  Bacterial Exacerbation of Acute Bronchitis     QUEtiapine 50 MG tablet  Commonly known as:  SEROQUEL  Take 1 tablet (50 mg total) by mouth at bedtime.   Indication:  Depressive Phase of Manic-Depression           Follow-up Information    Follow up with [facility].   [facility]  [facility]      Follow-up recommendations:  Activity:  As tolerated. Diet:  Regular. Other:  Keep follow-up appointments.  Comments:    Signed: Kristine LineaJolanta Murline Weigel, MD 04/23/2016, 12:50 PM

## 2016-04-23 NOTE — Progress Notes (Signed)
D: Observed pt in day room interacting with peers. Patient alert and oriented x4. Patient denies SI/HI/AVH. Pt affect is anxious. Pt speech is hyper verbal and rapid. When asked about mood pt stated "irritated, but nothing out of the ordinary." Pt showed no outwards signs of irritability. Pt rated anxiety 4/10 and depression 3/10. Pt stated his goal for this admission is "getting put back on proper meds.Marland Kitchen.i quit because they were too strong." Pt endorsed significantly decreasing alcohol intake over past 2 weeks. Pt feels that he can quit alcohol with minimal outside support. Pt requested nicorette gum, as pt stated "patches make me blister."  A: Offered active listening and support. Provided therapeutic communication. Administered scheduled medications. Encouraged pt to discuss alcohol intake with Child psychotherapistsocial worker and psychiatrist. Encouraged pt to participate in substance abuse programs post discharge.  R: Pt pleasant and cooperative. Pt refused Seroquel stating "100mg 's is too much..I only take 25mg ." Pt compliant with other medications. Will continue Q15 min. checks. Safety maintained.

## 2016-04-23 NOTE — [facility] Suicide Risk Assessment (Signed)
[facility]   Principal Problem: Bipolar 1 disorder, mixed, severe (HCC) Discharge Diagnoses:  Patient Active Problem List   Diagnosis Date Noted  . Bipolar 1 disorder, mixed, severe (HCC) [F31.63] 04/22/2016  . Alcohol use disorder, mild, abuse [F10.10] 04/22/2016  . Suicidal ideation [R45.851] 04/22/2016  . Tobacco use disorder [F17.200] 04/22/2016  . 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   Psychiatric/Behavioral: The patient is nervous/anxious.   All other systems reviewed and are negative.   Blood pressure 124/86, pulse 67, temperature 98 F (36.7 C), temperature source Oral, resp. rate 20, height 5\' 9"  (1.753 m), weight 54.432 kg (120 lb), SpO2 100 %.Body mass index is 17.71 kg/(m^2).  See SRA.                                                     Mental Status Per Nursing Assessment::   On Admission:     Demographic Factors:  Male, Adolescent or young adult and Caucasian  Loss Factors: Financial problems/change in socioeconomic status  Historical Factors: Prior suicide attempts, Family history of mental illness or substance abuse and Impulsivity  Risk Reduction Factors:   Responsible for children under 29 years of age, Sense of responsibility to family, Employed, Living with another person, especially a relative, Positive social support and Positive therapeutic relationship  Continued Clinical Symptoms:  Bipolar Disorder:   Mixed State Alcohol/Substance Abuse/Dependencies  Cognitive Features That Contribute To Risk:  None    Suicide Risk:  Minimal: No identifiable suicidal ideation.  Patients presenting with no risk factors but with morbid  ruminations; may be classified as minimal risk based on the severity of the depressive symptoms  Follow-up Information    Follow up with [facility].   [facility]  [facility]      Plan Of Care/Follow-up recommendations:  Activity:  As tolerated. Diet:  Regular. Other:  Keep follow-up appointments.  Kristine LineaJolanta Sonali Wivell, MD 04/23/2016, 12:31 PM

## 2016-04-23 NOTE — Progress Notes (Signed)
  [facility] :  Will you be returning to the same living situation after discharge:  Yes,  pt will be returning to [place] to live with his girlfriend and his children At discharge, do you have transportation home?: Yes,  pt will be picked up by his mother Do you have the ability to pay for your medications: Yes,  pt will be provided with prescriptions at discharge  Release of information consent forms completed and in the chart;  Patient's signature needed at discharge.  Patient to Follow up at: Follow-up Information    Go to [facility].   Why:  Please arrive at the walk-in clinic between the hours of 8am-4:30pm to schedule your hospital follow up with Dr. Bard HerbertMoffit and to schedule your next appointment for substance abuse treatment and therapy   Contact information:   [address] [place] [address] Ph: [phone] Fax: [phone]      [facility] to [facility].   Why:  Please arrive to the walk-in clinic between the hours of 9am-12:30pm and 1:30pm to 4:30pm for your assessment for free or low-cost medications   Contact information:   [address], [address] [place], [place] [address] Phone: [phone] Fax: [phone]      Next level of care provider has access to [facility] Link:no  Safety Planning and Suicide Prevention discussed: Yes,  completed with pt  Have you used any form of tobacco in the last 30 days? (Cigarettes, Smokeless Tobacco, Cigars, and/or Pipes): Yes  Has patient been referred to the Quitline?: Patient refused referral  Patient has been referred for addiction treatment: Yes  Nathan [name] 04/23/2016, 1:11 PM

## 2016-04-23 NOTE — BHH Suicide Risk Assessment (Signed)
B[facility] INPATIENT:  Family/Significant Other Suicide Prevention Education  Suicide Prevention Education:  Patient Refusal for Family/Significant Other Suicide Prevention Education: The patient Nathan [name] [name] has refused to provide written consent for family/significant other to be provided Family/Significant Other Suicide Prevention Education during admission and/or prior to discharge.  Physician notified.  CSW completed SPE with the pt.  Dorothe PeaJonathan F Taylyn Brame 04/23/2016, 1:08 PM

## 2016-04-23 NOTE — BHH Group Notes (Signed)
B[facility] LCSW Group Therapy  04/23/2016 2:12 PM  Type of Therapy:  Group Therapy  Participation Level:  Active  Participation Quality:  Monopolizing and Redirectable  Affect:  Anxious and Excited  Cognitive:  Alert and Disorganized  Insight:  Lacking  Engagement in Therapy:  Lacking and Monopolizing  Modes of Intervention:  Discussion, Socialization and Support  Summary of Progress/Problems: Patient attended and participated in group discussion but was grandiose and hyper-verbal. Patient introduced himself and shared during an introductory exercise that his dream vacation would be "no where, the here and now maybe, or to the end of the universe for a picnic because I have never been there but it may be cold". Patient shared that his diagnosis brings about positive and negative emotions but likes to stay focused on taking his meds and getting better. Patient reports he stopped taking meds then started drinking and "things got out of hand" which brought him into the hospital.    Lulu RidingIngle, Cleopha Indelicato T, MSW, LCSW 04/23/2016, 2:12 PM

## 2016-04-23 NOTE — Progress Notes (Signed)
Recreation Therapy Notes  Date: 05.05.17 Time: 9:30 am Location: Craft Room  Group Topic: Coping Skills  Goal Area(s) Addresses:  Patient will participate in healthy coping skill.  Behavioral Response: Attentive, Left early  Intervention: Coloring  Activity: Patients were instructed to color coloring sheets and think about what emotions they were experiencing as well as what they were thinking about while they were coloring.  Education: LRT educated patients on healthy coping skills.  Education Outcome: Patient left before LRT educated group.   Clinical Observations/Feedback: Patient colored coloring sheet. Patient left group at approximately 9:51 am to see his nurse. Patient did not return to group.  Jacquelynn CreeGreene,Hobart Marte M, LRT/CTRS 04/23/2016 10:21 AM

## 2016-04-23 NOTE — BHH Suicide Risk Assessment (Signed)
[facility] Admission Suicide Risk Assessment   Nursing information obtained from:    Demographic factors:    Current Mental Status:    Loss Factors:    Historical Factors:    Risk Reduction Factors:     Total Time spent with patient: 1 hour Principal Problem: Bipolar 1 disorder, mixed, severe (HCC) Diagnosis:   Patient Active Problem List   Diagnosis Date Noted  . Bipolar 1 disorder, mixed, severe (HCC) [F31.63] 04/22/2016  . Alcohol use disorder, mild, abuse [F10.10] 04/22/2016  . Suicidal ideation [R45.851] 04/22/2016  . Tobacco use disorder [F17.200] 04/22/2016  . Substance or medication-induced anxiety disorder [facility]) [F19.980] 04/21/2016   Subjective Data: Anxiety, suicidal ideation, mood instability.  Continued Clinical Symptoms:  Alcohol Use Disorder Identification Test Final Score (AUDIT): 10 The "Alcohol Use Disorders Identification Test", Guidelines for Use in Primary Care, Second Edition.  World Science writer [facility]). Score between 0-7:  no or low risk or alcohol related problems. Score between 8-15:  moderate risk of alcohol related problems. Score between 16-19:  high risk of alcohol related problems. Score 20 or above:  warrants further diagnostic evaluation for alcohol dependence and treatment.   CLINICAL FACTORS:   Severe Anxiety and/or Agitation Bipolar Disorder:   Mixed State   Musculoskeletal: Strength & Muscle Tone: within normal limits Gait & Station: normal Patient leans: N/A  Psychiatric Specialty Exam: Review of Systems  Psychiatric/Behavioral: The patient is nervous/anxious.   All other systems reviewed and are negative.   Blood pressure 124/86, pulse 67, temperature 98 F (36.7 C), temperature source Oral, resp. rate 20, height  (1.753 m), weight 54.432 kg (120 lb), SpO2 100 %.Body mass index is 17.71 kg/(m^2).  General Appearance: Casual  Eye Contact::  Good  Speech:  Clear and Coherent  Volume:  Normal  Mood:  Anxious  Affect:   Appropriate  Thought Process:  Goal Directed  Orientation:  Full (Time, Place, and Person)  Thought Content:  WDL  Suicidal Thoughts:  No  Homicidal Thoughts:  No  Memory:  Immediate;   Fair Recent;   Fair Remote;   Fair  Judgement:  Fair  Insight:  Present  Psychomotor Activity:  Normal  Concentration:  Fair  Recall:  [employer] of Knowledge:Fair  Language: Fair  Akathisia:  No  Handed:  Right  AIMS (if indicated):     Assets:  Communication Skills Desire for Improvement Housing Intimacy Physical Health Resilience Social Support Vocational/Educational  Sleep:  Number of Hours: 7.5  Cognition: WNL  ADL's:  Intact    COGNITIVE FEATURES THAT CONTRIBUTE TO RISK:  None    SUICIDE RISK:   Minimal: No identifiable suicidal ideation.  Patients presenting with no risk factors but with morbid ruminations; may be classified as minimal risk based on the severity of the depressive symptoms  PLAN OF CARE: Hospital admission, medication management, discharge planning.  Nathan [name] is a 30 year old male with history of bipolar disorder admitted for suicidal medication noncompliance.  1. Suicidal ideation. The patient adamantly denies any thoughts, intentions, or plans to hurt himself or others. He is able to contract for safety. He is forward thinking and optimistic about the future. He is a loving father and son.  2. Mood. He was restarted on Seroquel in the emergency room but wants to take 50 mg at bedtime only.  3. Anxiety. We started Vistaril.  4. GERD. He is on Protonix.  5. Bronchitis. We continue Mucinex and Penicillin as prescribed by emergency room physician.  6. Smoking. He declined a nicotine patch.  7. Alcohol abuse. The patient has a long history of substance use but has been clean of substances lately except for occasional beer. He did not need alcohol detox. Vital signs were stable.  8. Disposition. He was discharged to home with his family. [facility]    I certify that inpatient services furnished can reasonably be expected to improve the patient's condition.   Nathan LineaJolanta Kariyah Baugh, MD 04/23/2016, 12:17 PM

## 2016-04-23 NOTE — Plan of Care (Signed)
Problem: Ineffective individual coping Goal: STG: Patient will remain free from self harm Outcome: Progressing Pt has remained free from self harm     

## 2016-04-24 LAB — PROLACTIN: Prolactin: 7 ng/mL (ref 4.0–15.2)

## 2016-06-29 ENCOUNTER — Encounter: Payer: Self-pay | Admitting: Emergency Medicine

## 2016-06-29 ENCOUNTER — Emergency Department
Admission: EM | Admit: 2016-06-29 | Discharge: 2016-06-29 | Disposition: A | Discharge status: Home / Self Care | Payer: Self-pay | Attending: Emergency Medicine | Admitting: Emergency Medicine

## 2016-06-29 ENCOUNTER — Emergency Department: Payer: Self-pay

## 2016-06-29 DIAGNOSIS — M940 Chondrocostal junction syndrome [Tietze]: Secondary | ICD-10-CM | POA: Insufficient documentation

## 2016-06-29 DIAGNOSIS — F17210 Nicotine dependence, cigarettes, uncomplicated: Secondary | ICD-10-CM | POA: Insufficient documentation

## 2016-06-29 DIAGNOSIS — L089 Local infection of the skin and subcutaneous tissue, unspecified: Secondary | ICD-10-CM | POA: Insufficient documentation

## 2016-06-29 DIAGNOSIS — F319 Bipolar disorder, unspecified: Secondary | ICD-10-CM | POA: Insufficient documentation

## 2016-06-29 MED ORDER — NAPROXEN 500 MG PO TABS: 500.0000 mg | ORAL_TABLET | Freq: Two times a day (BID) | ORAL | Status: DC

## 2016-06-29 MED ORDER — BENZONATATE 100 MG PO CAPS: 100.0000 mg | ORAL_CAPSULE | Freq: Three times a day (TID) | ORAL | Status: DC | PRN

## 2016-06-29 MED ORDER — SULFAMETHOXAZOLE-TRIMETHOPRIM 800-160 MG PO TABS: 1.0000 | ORAL_TABLET | Freq: Two times a day (BID) | ORAL | Status: DC

## 2016-06-29 NOTE — ED Notes (Signed)
States he developed a cough about 3 days ago  Denies any fever  And then also developed a rash area to hands

## 2016-06-29 NOTE — ED Provider Notes (Signed)
[facility] Emergency Department Provider Note   ____________________________________________  Time seen: Approximately 3:40 PM  I have reviewed the triage vital signs and the nursing notes.   HISTORY  Chief Complaint Rash    HPI Anselmo RodHarmon [name] [name] is a 30 y.o. male patient percent bilaterally had a rash starting yesterday. Patient stated prior to today rash he's been experiencing productive coughing and night sweats. Patient also states fever and chills. Patient appears very anxious and believes he has MRSA on his hands and inside his lungs.`Patient became increasingly anxious after using the Internet to look up his symptoms. No palliative measures taken for this complaint. Patient denies any pain with this complaint.   Past Medical History  Diagnosis Date  . 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 Chronic headache   . Bipolar 1 disorder [facility](HCC)     Patient Active Problem List   Diagnosis Date Noted  . Bipolar 1 disorder, mixed, severe (HCC) 04/22/2016  . Alcohol use disorder, mild, abuse 04/22/2016  . Suicidal ideation 04/22/2016  . Tobacco use disorder 04/22/2016  . 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    Past Surgical History  Procedure Laterality Date  . Tonsillectomy      Current Outpatient Rx  Name  Route  Sig  Dispense  Refill  . albuterol (PROVENTIL HFA;VENTOLIN HFA) 108 (90 Base) MCG/ACT inhaler   Inhalation   Inhale 2 puffs into the lungs every 6 (six) hours as needed for wheezing or shortness of breath.   1 Inhaler   2   . benzonatate (TESSALON PERLES) 100 MG capsule   Oral   Take 1 capsule (100 mg total) by mouth 3 (three) times daily as needed for cough.   15 capsule   0   . guaiFENesin (MUCINEX) 600 MG 12 hr tablet   Oral   Take 1 tablet (600 mg total) by mouth 2 (two) times daily.   30 tablet   0   . hydrOXYzine (ATARAX/VISTARIL) 50 MG tablet   Oral   Take 1 tablet (50 mg total) by mouth 3  (three) times daily as needed for anxiety.   90 tablet   0   . naproxen (NAPROSYN) 500 MG tablet   Oral   Take 1 tablet (500 mg total) by mouth 2 (two) times daily with a meal.   20 tablet   0   . pantoprazole (PROTONIX) 40 MG tablet   Oral   Take 1 tablet (40 mg total) by mouth daily.   30 tablet   0   . penicillin v potassium (VEETID) 500 MG tablet   Oral   Take 1 tablet (500 mg total) by mouth 2 (two) times daily.   30 tablet   0   . QUEtiapine (SEROQUEL) 50 MG tablet   Oral   Take 1 tablet (50 mg total) by mouth at bedtime.   30 tablet   0   . sulfamethoxazole-trimethoprim (BACTRIM DS,SEPTRA DS) 800-160 MG tablet   Oral   Take 1 tablet by mouth 2 (two) times daily.   20 tablet   0     Allergies Geodon  No family history on file.  Social History Social History  Substance Use Topics  . Smoking status: Current Every Day Smoker -- 4.50 packs/day    Types: Cigarettes  . Smokeless tobacco: None  . Alcohol Use: 7.2 oz/week    12 Cans of beer per week    Review of Systems Constitutional:  No fever/chills Eyes: No visual changes. ENT: No sore throat. Cardiovascular: Denies chest pain. Respiratory: Denies shortness of breath. Gastrointestinal: No abdominal pain.  No nausea, no vomiting.  No diarrhea.  No constipation. Genitourinary: Negative for dysuria. Musculoskeletal: Negative for back pain. Skin: Negative for rash. Neurological: Negative for headaches, focal weakness or numbness. Psychiatric:Bipolar Hematological/Lymphatic:Hepatitis C Allergic/Immunilogical: Geodon  ____________________________________________   PHYSICAL EXAM:  VITAL SIGNS: ED Triage Vitals  Enc Vitals Group     BP 06/29/16 1521 158/86 mmHg     Pulse Rate 06/29/16 1521 75     Resp 06/29/16 1521 20     Temp 06/29/16 1521 98.4 F (36.9 C)     Temp Source 06/29/16 1521 Oral     SpO2 06/29/16 1521 99 %     Weight 06/29/16 1521 120 lb (54.432 kg)     Height 06/29/16 1521 5'  9" (1.753 m)     Head Cir --      Peak Flow --      Pain Score --      Pain Loc --      Pain Edu? --      Excl. in GC? --     Constitutional: Alert and oriented. Well appearing and in no acute distress. Eyes: Conjunctivae are normal. PERRL. EOMI. Head: Atraumatic. Nose: No congestion/rhinnorhea. Mouth/Throat: Mucous membranes are moist.  Oropharynx non-erythematous. Neck: No stridor.  No cervical spine tenderness to palpation. Hematological/Lymphatic/Immunilogical: No cervical lymphadenopathy. Cardiovascular: Normal rate, regular rhythm. Grossly normal heart sounds.  Good peripheral circulation. Respiratory: Normal respiratory effort.  No retractions. Lungs CTAB. Gastrointestinal: Soft and nontender. No distention. No abdominal bruits. No CVA tenderness. Musculoskeletal: No lower extremity tenderness nor edema.  No joint effusions. Neurologic:  Normal speech and language. No gross focal neurologic deficits are appreciated. No gait instability. Skin:  Skin is warm, dry and intact. No rash noted. Erythematous papular lesions dose aspect bilateral hand Psychiatric: Mood and affect are normal. Speech and behavior are normal.  ____________________________________________   LABS (all labs ordered are listed, but only abnormal results are displayed)  Labs Reviewed - No data to display ____________________________________________  EKG   ____________________________________________  RADIOLOGY  No acute findings on chest x-ray  ____________________________________________   PROCEDURES  Procedure(s) performed: None  Procedures  Critical Care performed: No  ____________________________________________   INITIAL IMPRESSION / ASSESSMENT AND PLAN / ED COURSE  Pertinent labs & imaging results that were available during my care of the patient were reviewed by me and considered in my medical decision making (see chart for details).  The last skin infection bilateral hand and  costochondritis secondary to persistent cough. ____________________________________________   FINAL CLINICAL IMPRESSION(S) / ED DIAGNOSES  Final diagnoses:  Skin infection  Costochondritis      NEW MEDICATIONS STARTED DURING THIS VISIT:  New Prescriptions   BENZONATATE (TESSALON PERLES) 100 MG CAPSULE    Take 1 capsule (100 mg total) by mouth 3 (three) times daily as needed for cough.   NAPROXEN (NAPROSYN) 500 MG TABLET    Take 1 tablet (500 mg total) by mouth 2 (two) times daily with a meal.   SULFAMETHOXAZOLE-TRIMETHOPRIM (BACTRIM DS,SEPTRA DS) 800-160 MG TABLET    Take 1 tablet by mouth 2 (two) times daily.     Note:  This document was prepared using Dragon voice recognition software and may include unintentional dictation errors.    Joni Reining, PA-C 06/29/16 1616  Sharyn Creamer, MD 06/29/16 2122

## 2016-06-29 NOTE — ED Notes (Signed)
Pt with rash to hands

## 2016-06-29 NOTE — Discharge Instructions (Signed)
Costochondritis Costochondritis, sometimes called Tietze syndrome, is a swelling and irritation (inflammation) of the tissue (cartilage) that connects your ribs with your breastbone (sternum). It causes pain in the chest and rib area. Costochondritis usually goes away on its own over time. It can take up to 6 weeks or longer to get better, especially if you are unable to limit your activities. CAUSES  Some cases of costochondritis have no known cause. Possible causes include:  Injury (trauma).  Exercise or activity such as lifting.  Severe coughing. SIGNS AND SYMPTOMS  Pain and tenderness in the chest and rib area.  Pain that gets worse when coughing or taking deep breaths.  Pain that gets worse with specific movements. DIAGNOSIS  Your health care provider will do a physical exam and ask about your symptoms. Chest X-rays or other tests may be done to rule out other problems. TREATMENT  Costochondritis usually goes away on its own over time. Your health care provider may prescribe medicine to help relieve pain. HOME CARE INSTRUCTIONS   Avoid exhausting physical activity. Try not to strain your ribs during normal activity. This would include any activities using chest, abdominal, and side muscles, especially if heavy weights are used.  Apply ice to the affected area for the first 2 days after the pain begins.  Put ice in a plastic bag.  Place a towel between your skin and the bag.  Leave the ice on for 20 minutes, 2-3 times a day.  Only take over-the-counter or prescription medicines as directed by your health care provider. SEEK MEDICAL CARE IF:  You have redness or swelling at the rib joints. These are signs of infection.  Your pain does not go away despite rest or medicine. SEEK IMMEDIATE MEDICAL CARE IF:   Your pain increases or you are very uncomfortable.  You have shortness of breath or difficulty breathing.  You cough up blood.  You have worse chest pains,  sweating, or vomiting.  You have a fever or persistent symptoms for more than 2-3 days.  You have a fever and your symptoms suddenly get worse. MAKE SURE YOU:   Understand these instructions.  Will watch your condition.  Will get help right away if you are not doing well or get worse.   This information is not intended to replace advice given to you by your health care provider. Make sure you discuss any questions you have with your health care provider.   Document Released: 09/15/2005 Document Revised: 09/26/2013 Document Reviewed: 07/10/2013 Elsevier Interactive Patient Education 2016 Elsevier Inc.  Cellulitis Cellulitis is an infection of the skin and the tissue beneath it. The infected area is usually red and tender. Cellulitis occurs most often in the arms and lower legs.  CAUSES  Cellulitis is caused by bacteria that enter the skin through cracks or cuts in the skin. The most common types of bacteria that cause cellulitis are staphylococci and streptococci. SIGNS AND SYMPTOMS   Redness and warmth.  Swelling.  Tenderness or pain.  Fever. DIAGNOSIS  Your health care provider can usually determine what is wrong based on a physical exam. Blood tests may also be done. TREATMENT  Treatment usually involves taking an antibiotic medicine. HOME CARE INSTRUCTIONS   Take your antibiotic medicine as directed by your health care provider. Finish the antibiotic even if you start to feel better.  Keep the infected arm or leg elevated to reduce swelling.  Apply a warm cloth to the affected area up to 4 times per day  to relieve pain.  Take medicines only as directed by your health care provider.  Keep all follow-up visits as directed by your health care provider. SEEK MEDICAL CARE IF:   You notice red streaks coming from the infected area.  Your red area gets larger or turns dark in color.  Your bone or joint underneath the infected area becomes painful after the skin has  healed.  Your infection returns in the same area or another area.  You notice a swollen bump in the infected area.  You develop new symptoms.  You have a fever. SEEK IMMEDIATE MEDICAL CARE IF:   You feel very sleepy.  You develop vomiting or diarrhea.  You have a general ill feeling (malaise) with muscle aches and pains.   This information is not intended to replace advice given to you by your health care provider. Make sure you discuss any questions you have with your health care provider.   Document Released: 09/15/2005 Document Revised: 08/27/2015 Document Reviewed: 02/21/2012 Elsevier Interactive Patient Education [employer] Elsevier Inc.

## 2016-08-01 ENCOUNTER — Emergency Department
Admission: EM | Admit: 2016-08-01 | Discharge: 2016-08-02 | Disposition: A | Discharge status: Home / Self Care | Payer: Self-pay | Attending: Emergency Medicine | Admitting: Emergency Medicine

## 2016-08-01 ENCOUNTER — Encounter: Payer: Self-pay | Admitting: Emergency Medicine

## 2016-08-01 DIAGNOSIS — Z79899 Other long term (current) drug therapy: Secondary | ICD-10-CM | POA: Insufficient documentation

## 2016-08-01 DIAGNOSIS — F17210 Nicotine dependence, cigarettes, uncomplicated: Secondary | ICD-10-CM | POA: Insufficient documentation

## 2016-08-01 DIAGNOSIS — M62838 Other muscle spasm: Secondary | ICD-10-CM | POA: Insufficient documentation

## 2016-08-01 NOTE — ED Triage Notes (Signed)
Pt arrived via ems from home with complaints jugular vein pain. Pt states "it feels like its going to explode outside of my head." Pt states the pain started in the vein of his left arm and moved progressively up to his neck and caused blurry vision today.

## 2016-08-02 ENCOUNTER — Emergency Department: Payer: Self-pay

## 2016-08-02 NOTE — ED Notes (Signed)
Patient remains in ultrasound.

## 2016-08-02 NOTE — ED Provider Notes (Signed)
[facility] Emergency Department Provider Note  ____________________________________________   First MD Initiated Contact with Patient 08/02/16 0009     (approximate)  I have reviewed the triage vital signs and the nursing notes.   HISTORY  Chief Complaint Neck Pain and Blurred Vision   HPI [facility] Mahoney is a 30 y.o. male with history of polysubstance abuse including IV heroin use methamphetamines crack cocaine as well as alcohol abuse presents to the emergency department with acute onset of left anterior neck pain with radiation into left upper arm accompanied by blurred vision approximately 30 minute before presentation to the ER. Patient states that last week he had persistent and medial upper arm pain with a "swollen vein". Patient admits to marijuana use as well as drinking 2 beers tonight.   Past Medical History:  Diagnosis Date  . Bipolar 1 disorder (HCC)   . Chronic headache   . Hep C w/o coma, chronic [facility])     Patient Active Problem List   Diagnosis Date Noted  . Bipolar 1 disorder, mixed, severe (HCC) 04/22/2016  . Alcohol use disorder, mild, abuse 04/22/2016  . Suicidal ideation 04/22/2016  . Tobacco use disorder 04/22/2016  . 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 TONSILLECTOMY      Prior to Admission medications   Medication Sig Start Date End Date Taking? Authorizing Provider  albuterol (PROVENTIL HFA;VENTOLIN HFA) 108 (90 Base) MCG/ACT inhaler Inhale 2 puffs into the lungs every 6 (six) hours as needed for wheezing or shortness of breath. 04/23/16  Yes Jolanta B Pucilowska, MD  diazepam (VALIUM) 5 MG tablet Take 5 mg by mouth every 12 (twelve) hours as needed for anxiety.   Yes Historical Provider, MD  QUEtiapine (SEROQUEL) 25 MG tablet Take 25 mg by mouth at bedtime.   Yes Historical Provider, MD  benzonatate (TESSALON PERLES) 100 MG capsule  Take 1 capsule (100 mg total) by mouth 3 (three) times daily as needed for cough. Patient not taking: Reported on 08/02/2016 06/29/16   Joni Reining, PA-C  guaiFENesin (MUCINEX) 600 MG 12 hr tablet Take 1 tablet (600 mg total) by mouth 2 (two) times daily. Patient not taking: Reported on 08/02/2016 04/23/16   Shari Prows, MD  hydrOXYzine (ATARAX/VISTARIL) 50 MG tablet Take 1 tablet (50 mg total) by mouth 3 (three) times daily as needed for anxiety. Patient not taking: Reported on 08/02/2016 04/23/16   Shari Prows, MD  naproxen (NAPROSYN) 500 MG tablet Take 1 tablet (500 mg total) by mouth 2 (two) times daily with a meal. Patient not taking: Reported on 08/02/2016 06/29/16   Joni Reining, PA-C  pantoprazole (PROTONIX) 40 MG tablet Take 1 tablet (40 mg total) by mouth daily. Patient not taking: Reported on 08/02/2016 04/23/16   Shari Prows, MD  penicillin v potassium (VEETID) 500 MG tablet Take 1 tablet (500 mg total) by mouth 2 (two) times daily. Patient not taking: Reported on 08/02/2016 04/23/16   Shari Prows, MD  QUEtiapine (SEROQUEL) 50 MG tablet Take 1 tablet (50 mg total) by mouth at bedtime. Patient not taking: Reported on 08/02/2016 04/23/16   Shari Prows, MD  sulfamethoxazole-trimethoprim (BACTRIM DS,SEPTRA DS) 800-160 MG tablet Take 1 tablet by mouth 2 (two) times daily. Patient not taking: Reported on 08/02/2016 06/29/16   Joni Reining, PA-C    Allergies Geodon [ziprasidone hcl]  No family history on file.  Social History Social History  Substance Use Topics  . Smoking status: Current Every Day Smoker    Packs/day: 4.50    Types: Cigarettes  . Smokeless tobacco: Former NeurosurgeonUser  . Alcohol use 7.2 oz/week    12 Cans of beer per week    Review of Systems Constitutional: No fever/chills Eyes: No visual changes. ENT: No sore throat. Cardiovascular: Denies chest pain. Respiratory: Denies shortness of breath. Gastrointestinal: No abdominal pain.   No nausea, no vomiting.  No diarrhea.  No constipation. Genitourinary: Negative for dysuria. Musculoskeletal: Negative for back pain. Skin: Negative for rash. Neurological: Negative for headaches, focal weakness or numbness.  10-point ROS otherwise negative.  ____________________________________________   PHYSICAL EXAM:  VITAL SIGNS: ED Triage Vitals  Enc Vitals Group     BP 08/01/16 2347 (!) 138/109     Pulse Rate 08/01/16 2347 66     Resp 08/01/16 2347 (!) 22     Temp 08/01/16 2347 98.1 F (36.7 C)     Temp Source 08/01/16 2347 Oral     SpO2 08/01/16 2347 99 %     Weight 08/01/16 2350 125 lb (56.7 kg)     Height 08/01/16 2350 5\' 9"  (1.753 m)     Head Circumference --      Peak Flow --      Pain Score 08/01/16 2350 7     Pain Loc --      Pain Edu? --      Excl. in GC? --     Constitutional: Alert and oriented. Well appearing and in no acute distress. Eyes: Conjunctivae are normal. PERRL. EOMI. Head: Atraumatic. Ears:  Healthy appearing ear canals and TMs bilaterally Nose: No congestion/rhinnorhea. Mouth/Throat: Mucous membranes are moist.  Oropharynx non-erythematous. Neck: No stridor.  No meningeal signs.  Cardiovascular: Normal rate, regular rhythm. Good peripheral circulation. Grossly normal heart sounds.   Respiratory: Normal respiratory effort.  No retractions. Lungs CTAB. Gastrointestinal: Soft and nontender. No distention.  Musculoskeletal: No lower extremity tenderness nor edema. No gross deformities of extremities. Neurologic:  Normal speech and language. No gross focal neurologic deficits are appreciated.  Skin:  Skin is warm, dry and intact. No rash noted. Psychiatric: Mood and affect are normal. Speech and behavior are normal.    Labs Reviewed - No data to display   RADIOLOGY I, [place] N Dessie Tatem, personally viewed and evaluated these images (plain radiographs) as part of my medical decision making, as well as reviewing the written report by the  radiologist.  [place] Venous Img Upper Uni Left  Result Date: 08/02/2016 CLINICAL DATA:  Left neck pain radiating down the left arm for 2 days but worsening tonight. History of tobacco use. EXAM: Left UPPER EXTREMITY VENOUS DOPPLER ULTRASOUND TECHNIQUE: Gray-scale sonography with graded compression, as well as color Doppler and duplex ultrasound were performed to evaluate the upper extremity deep venous system from the level of the subclavian vein and including the jugular, axillary, basilic, radial, ulnar and upper cephalic vein. Spectral Doppler was utilized to evaluate flow at rest and with distal augmentation maneuvers. COMPARISON:  None. FINDINGS: Contralateral Subclavian Vein: Respiratory phasicity is normal and symmetric with the symptomatic side. No evidence of thrombus. Normal compressibility. Internal Jugular Vein: No evidence of thrombus. Normal compressibility, respiratory phasicity and response to augmentation. Subclavian Vein: No evidence of thrombus. Normal compressibility, respiratory phasicity and response to augmentation. Axillary Vein: No evidence of thrombus. Normal compressibility, respiratory phasicity and response to augmentation. Cephalic Vein: No evidence of thrombus. Normal compressibility,  respiratory phasicity and response to augmentation. Basilic Vein: No evidence of thrombus. Normal compressibility, respiratory phasicity and response to augmentation. Brachial Veins: No evidence of thrombus. Normal compressibility, respiratory phasicity and response to augmentation. Radial Veins: No evidence of thrombus. Normal compressibility, respiratory phasicity and response to augmentation. Ulnar Veins: No evidence of thrombus. Normal compressibility, respiratory phasicity and response to augmentation. Venous Reflux:  None visualized. Other Findings:  None visualized. IMPRESSION: No evidence of deep venous thrombosis. Electronically Signed   By: Burman NievesWilliam  Stevens M.D.   On: 08/02/2016 03:57       Procedures      INITIAL IMPRESSION / ASSESSMENT AND PLAN / ED COURSE  Pertinent labs & imaging results that were available during my care of the patient were reviewed by me and considered in my medical decision making (see chart for details).    Clinical Course    ____________________________________________  FINAL CLINICAL IMPRESSION(S) / ED DIAGNOSES  Final diagnoses:  Muscle spasms of neck     MEDICATIONS GIVEN DURING THIS VISIT:  Medications - No data to display   NEW OUTPATIENT MEDICATIONS STARTED DURING THIS VISIT:  New Prescriptions   No medications on file      Note:  This document was prepared using Dragon voice recognition software and may include unintentional dictation errors.     Darci Currentandolph N Aliyanna Wassmer, MD 08/02/16 [phone]

## 2016-08-02 NOTE — ED Notes (Signed)
Patient returned from [facility].

## 2016-08-04 DIAGNOSIS — Z79899 Other long term (current) drug therapy: Secondary | ICD-10-CM | POA: Insufficient documentation

## 2016-08-04 DIAGNOSIS — F329 Major depressive disorder, single episode, unspecified: Secondary | ICD-10-CM | POA: Insufficient documentation

## 2016-08-04 DIAGNOSIS — F17210 Nicotine dependence, cigarettes, uncomplicated: Secondary | ICD-10-CM | POA: Insufficient documentation

## 2016-08-04 DIAGNOSIS — F1290 Cannabis use, unspecified, uncomplicated: Secondary | ICD-10-CM | POA: Insufficient documentation

## 2016-08-04 DIAGNOSIS — F1010 Alcohol abuse, uncomplicated: Secondary | ICD-10-CM | POA: Insufficient documentation

## 2016-08-05 ENCOUNTER — Emergency Department
Admission: EM | Admit: 2016-08-05 | Discharge: 2016-08-05 | Disposition: A | Discharge status: Home / Self Care | Payer: Self-pay | Attending: Emergency Medicine | Admitting: Emergency Medicine

## 2016-08-05 ENCOUNTER — Encounter: Payer: Self-pay | Admitting: *Deleted

## 2016-08-05 DIAGNOSIS — F32A Depression, unspecified: Secondary | ICD-10-CM

## 2016-08-05 DIAGNOSIS — F329 Major depressive disorder, single episode, unspecified: Secondary | ICD-10-CM

## 2016-08-05 DIAGNOSIS — F1010 Alcohol abuse, uncomplicated: Secondary | ICD-10-CM

## 2016-08-05 LAB — URINE DRUG SCREEN, QUALITATIVE (ARMC ONLY)
AMPHETAMINES, UR SCREEN: NOT DETECTED
Barbiturates, Ur Screen: NOT DETECTED
Benzodiazepine, Ur Scrn: POSITIVE — AB
Cannabinoid 50 Ng, Ur ~~LOC~~: POSITIVE — AB
Cocaine Metabolite,Ur ~~LOC~~: NOT DETECTED
MDMA (ECSTASY) UR SCREEN: NOT DETECTED
Methadone Scn, Ur: NOT DETECTED
Opiate, Ur Screen: NOT DETECTED
Phencyclidine (PCP) Ur S: NOT DETECTED
Tricyclic, Ur Screen: NOT DETECTED

## 2016-08-05 LAB — COMPREHENSIVE METABOLIC PANEL
ALBUMIN: 4.7 g/dL (ref 3.5–5.0)
ALT: 203 U/L — AB (ref 17–63)
AST: 109 U/L — ABNORMAL HIGH (ref 15–41)
Alkaline Phosphatase: 64 U/L (ref 38–126)
Anion gap: 9 (ref 5–15)
BUN: 13 mg/dL (ref 6–20)
CHLORIDE: 103 mmol/L (ref 101–111)
CO2: 24 mmol/L (ref 22–32)
CREATININE: 0.87 mg/dL (ref 0.61–1.24)
Calcium: 9.3 mg/dL (ref 8.9–10.3)
GFR calc non Af Amer: >60 mL/min (ref >60)
Glucose, Bld: 86 mg/dL (ref 65–99)
Potassium: 3.9 mmol/L (ref 3.5–5.1)
SODIUM: 136 mmol/L (ref 135–145)
Total Bilirubin: 0.4 mg/dL (ref 0.3–1.2)
Total Protein: 8.3 g/dL — ABNORMAL HIGH (ref 6.5–8.1)

## 2016-08-05 LAB — CBC
HEMATOCRIT: 45 % (ref 40.0–52.0)
Hemoglobin: 15.6 g/dL (ref 13.0–18.0)
MCH: 32.1 pg (ref 26.0–34.0)
MCHC: 34.7 g/dL (ref 32.0–36.0)
MCV: 92.5 fL (ref 80.0–100.0)
Platelets: 266 10*3/uL (ref 150–440)
RBC: 4.86 MIL/uL (ref 4.40–5.90)
RDW: 14.3 % (ref 11.5–14.5)
WBC: 7.6 10*3/uL (ref 3.8–10.6)

## 2016-08-05 LAB — ETHANOL: Alcohol, Ethyl (B): 40 mg/dL — ABNORMAL HIGH (ref <5)

## 2016-08-05 MED ORDER — NICOTINE 14 MG/24HR TD PT24: 14.0000 mg | MEDICATED_PATCH | Freq: Every day | TRANSDERMAL | Status: DC

## 2016-08-05 MED ORDER — NICOTINE POLACRILEX 2 MG MT GUM
2.0000 mg | CHEWING_GUM | OROMUCOSAL | Status: DC
  Administered 2016-08-05: 2 mg via ORAL
  Filled 2016-08-05: qty 1

## 2016-08-05 MED ORDER — QUETIAPINE FUMARATE 25 MG PO TABS: 25.0000 mg | ORAL_TABLET | Freq: Every day | ORAL | Status: DC

## 2016-08-05 MED ORDER — QUETIAPINE FUMARATE 25 MG PO TABS: 25.0000 mg | ORAL_TABLET | Freq: Every day | ORAL | 0 refills | Status: DC

## 2016-08-05 MED ORDER — DIAZEPAM 5 MG PO TABS: 2.5000 mg | ORAL_TABLET | Freq: Two times a day (BID) | ORAL | Status: DC | PRN

## 2016-08-05 NOTE — BHH Counselor (Signed)
Pt presents to the ED voluntarily requesting alcohol detox. Pt reports to being a recovering alcoholic and states he last drink before today was 1 month ago. Pt states he received some bad news today.  He states he started drinking 2.5 gallons of alcohol, various wines, vodka, and beer.  TTS Counselor spoke with Andrey CampanileSandy, Intake Nurse with [facility], who stated that they have one male bed available.  She states that [facility] will accept patient but will need a weeks supply of Seroquel.  Pharmacy will supply patient with a supply of meds but cannot fill his prescription until 8 am.  TTS Counselor relayed this information to MerchantvilleSandy who agreed to hold the bed for patient.  She states that [facility]st [facility] 9-9:30 am to transport patient to [facility].

## 2016-08-05 NOTE — ED Notes (Signed)
Patient with discharge orders, will be going to [facility]. Patient voices understanding.

## 2016-08-05 NOTE — ED Notes (Signed)
Patient denies SI/HI/AVH.  Patient states, "I'm here for alcoholism.  I've been drinking for 20 years."  Patient reports that his father encouraged his drinking during childhood.  Patient reports going to school drunk on a daily basis.  Patient states, "I would drink a bottle of jagermeister every day."  Patient is calm and cooperative.  Meal given and patient oriented to unit.

## 2016-08-05 NOTE — ED Notes (Signed)
Butch, RN to change pt out into purple paper scrubs.

## 2016-08-05 NOTE — ED Notes (Signed)
Pt reports to being a recovering alcoholic. Pt states last drink before today was 1 month ago. Pt states he received some bad news (liver enzymes being critical from drinking/hep C). Pt states he received bad news at 11:30am, began drinking an hour later. Reports drinking 2.5 gallons of alcohol, various wines, vodka, beer. Pt states his wife called and police brought him in. Reports he smokes 1/2 pack per day of cigarettes, uses nicotene gum.   Pt tearful during assessment but calm and cooperative.

## 2016-08-05 NOTE — ED Notes (Addendum)
Patient has been accepted to [facility] contingent upon him bring a 1 week supply of seroquel.  Dr Manson PasseyBrown wrote the prescription and writer took it to pharmacy to have it filled.  Pharmacist stated that medication would be available around 8am.  [facility] will pick up patient around 9:15.

## 2016-08-05 NOTE — ED Notes (Signed)
1 pt belonging bag placed in [facility] 

## 2016-08-05 NOTE — ED Triage Notes (Signed)
Pt ambulatory to triage with unsteady gait.  [place] deputy.  Pt states he got bad news and began drinking again today.  Last drink was 1 month ago.  Pt denies SI or HI.  Pt calm and cooperative and   tearful

## 2016-08-05 NOTE — ED Notes (Signed)
Pt had asked for a nicotene inahler. This RN was told we don't have that in our pharmacy so nicotene gum and patch ordered. Pt stated he breaks out at the patch, but it is another option.

## 2016-08-05 NOTE — ED Notes (Signed)
Patient is alert and oriented, aware that he is leaving to go [facility], Patient is cooperative, no behavior issues, denies Si/hi or avh. Patient talkative, q 15 min. Checks and camera monitoring in progress.

## 2016-08-05 NOTE — ED Notes (Signed)
Patient discharged, transportation transferred to [facility], seroquel that was ordered picked up from hospital pharmacy and given to driver, and all belonging given to Patient.

## 2016-08-05 NOTE — ED Provider Notes (Signed)
[facility] Emergency Department Provider Note  ____________________________________________   First MD Initiated Contact with Patient 08/05/16 0015     (approximate)  I have reviewed the triage vital signs and the nursing notes.   HISTORY  Chief Complaint Behavior Problem and Alcohol Intoxication   HPI Nathan RodHarmon [name] Dewaine [name] is a 30 y.o. male with history of bipolar disorder chronic alcohol abuse presents to the emergency department with "I relapsed today". Patient is requesting alcohol detox at this time. Patient denies any suicidal or homicidal ideations at this time. Patient however does admit to depressed mood. Patient admits to heavy alcohol ingestion today   Past Medical History:  Diagnosis Date  . Bipolar 1 disorder (HCC)   . Chronic headache   . Hep C w/o coma, chronic [facility](HCC)     Patient Active Problem List   Diagnosis Date Noted  . Bipolar 1 disorder, mixed, severe (HCC) 04/22/2016  . Alcohol use disorder, mild, abuse 04/22/2016  . Suicidal ideation 04/22/2016  . Tobacco use disorder 04/22/2016  . 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 TONSILLECTOMY      Prior to Admission medications   Medication Sig Start Date End Date Taking? Authorizing Provider  albuterol (PROVENTIL HFA;VENTOLIN HFA) 108 (90 Base) MCG/ACT inhaler Inhale 2 puffs into the lungs every 6 (six) hours as needed for wheezing or shortness of breath. 04/23/16   Shari ProwsJolanta B Pucilowska, MD  benzonatate (TESSALON PERLES) 100 MG capsule Take 1 capsule (100 mg total) by mouth 3 (three) times daily as needed for cough. Patient not taking: Reported on 08/02/2016 06/29/16   Joni Reiningonald K Smith, PA-C  diazepam (VALIUM) 5 MG tablet Take 5 mg by mouth every 12 (twelve) hours as needed for anxiety.    Historical Provider, MD  guaiFENesin (MUCINEX) 600 MG 12 hr tablet Take 1 tablet (600 mg total) by mouth 2 (two)  times daily. Patient not taking: Reported on 08/02/2016 04/23/16   Shari ProwsJolanta B Pucilowska, MD  hydrOXYzine (ATARAX/VISTARIL) 50 MG tablet Take 1 tablet (50 mg total) by mouth 3 (three) times daily as needed for anxiety. Patient not taking: Reported on 08/02/2016 04/23/16   Shari ProwsJolanta B Pucilowska, MD  naproxen (NAPROSYN) 500 MG tablet Take 1 tablet (500 mg total) by mouth 2 (two) times daily with a meal. Patient not taking: Reported on 08/02/2016 06/29/16   Joni Reiningonald K Smith, PA-C  pantoprazole (PROTONIX) 40 MG tablet Take 1 tablet (40 mg total) by mouth daily. Patient not taking: Reported on 08/02/2016 04/23/16   Shari ProwsJolanta B Pucilowska, MD  penicillin v potassium (VEETID) 500 MG tablet Take 1 tablet (500 mg total) by mouth 2 (two) times daily. Patient not taking: Reported on 08/02/2016 04/23/16   Shari ProwsJolanta B Pucilowska, MD  QUEtiapine (SEROQUEL) 25 MG tablet Take 25 mg by mouth at bedtime.    Historical Provider, MD  QUEtiapine (SEROQUEL) 50 MG tablet Take 1 tablet (50 mg total) by mouth at bedtime. Patient not taking: Reported on 08/02/2016 04/23/16   Shari ProwsJolanta B Pucilowska, MD  sulfamethoxazole-trimethoprim (BACTRIM DS,SEPTRA DS) 800-160 MG tablet Take 1 tablet by mouth 2 (two) times daily. Patient not taking: Reported on 08/02/2016 06/29/16   Joni Reiningonald K Smith, PA-C    Allergies Geodon [ziprasidone hcl]  No family history on file.  Social History Social History  Substance Use Topics  . Smoking status: Current Every Day Smoker    Packs/day: 4.50    Types: Cigarettes  .  Smokeless tobacco: Former NeurosurgeonUser  . Alcohol use 7.2 oz/week    12 Cans of beer per week    Review of Systems Constitutional: No fever/chills Eyes: No visual changes. ENT: No sore throat. Cardiovascular: Denies chest pain. Respiratory: Denies shortness of breath. Gastrointestinal: No abdominal pain.  No nausea, no vomiting.  No diarrhea.  No constipation. Genitourinary: Negative for dysuria. Musculoskeletal: Negative for back pain. Skin:  Negative for rash. Neurological: Negative for headaches, focal weakness or numbness. Psychiatric:Positive for alcohol abuse today  10-point ROS otherwise negative.  ____________________________________________   PHYSICAL EXAM:  VITAL SIGNS: ED Triage Vitals  Enc Vitals Group     BP 08/05/16 0008 (!) 142/105     Pulse Rate 08/05/16 0008 87     Resp 08/05/16 0008 18     Temp 08/05/16 0008 98.6 F (37 C)     Temp Source 08/05/16 0008 Oral     SpO2 08/05/16 0008 98 %     Weight 08/05/16 0009 126 lb (57.2 kg)     Height 08/05/16 0009 5\' 9"  (1.753 m)     Head Circumference --      Peak Flow --      Pain Score 08/05/16 0010 5     Pain Loc --      Pain Edu? --      Excl. in GC? --     Constitutional: Alert and oriented. Well appearing and in no acute distress. Eyes: Conjunctivae are normal. PERRL. EOMI. Head: Atraumatic. Ears:  Healthy appearing ear canals and TMs bilaterally Nose: No congestion/rhinnorhea. Mouth/Throat: Mucous membranes are moist.  Oropharynx non-erythematous. Neck: No stridor.  No meningeal signs. Cardiovascular: Normal rate, regular rhythm. Good peripheral circulation. Grossly normal heart sounds.   Respiratory: Normal respiratory effort.  No retractions. Lungs CTAB. Gastrointestinal: Soft and nontender. No distention.   Musculoskeletal: No lower extremity tenderness nor edema. No gross deformities of extremities. Neurologic:  Normal speech and language. No gross focal neurologic deficits are appreciated.  Skin:  Skin is warm, dry and intact. No rash noted. Psychiatric:Depressed mood, tearful Speech and behavior are normal.  ____________________________________________   LABS (all labs ordered are listed, but only abnormal results are displayed)  Labs Reviewed  COMPREHENSIVE METABOLIC PANEL  ETHANOL  CBC  URINE DRUG SCREEN, QUALITATIVE ([facility] ONLY)    RADIOLOGY I, [place] N Maurina Fawaz, personally viewed and evaluated these images (plain radiographs)  as part of my medical decision making, as well as reviewing the written report by the radiologist.  No results found.    Procedures     INITIAL IMPRESSION / ASSESSMENT AND PLAN / ED COURSE  Pertinent labs & imaging results that were available during my care of the patient were reviewed by me and considered in my medical decision making (see chart for details).  Patient accepted by [facility]  Clinical Course    ____________________________________________  FINAL CLINICAL IMPRESSION(S) / ED DIAGNOSES  Final diagnoses:  ETOH abuse  Depression     MEDICATIONS GIVEN DURING THIS VISIT:  Medications - No data to display   NEW OUTPATIENT MEDICATIONS STARTED DURING THIS VISIT:  New Prescriptions   No medications on file      Note:  This document was prepared using Dragon voice recognition software and may include unintentional dictation errors.    Darci Currentandolph N Berman Grainger, MD 08/05/16 [phone]

## 2016-08-23 ENCOUNTER — Emergency Department: Payer: Self-pay

## 2016-08-23 ENCOUNTER — Emergency Department
Admission: EM | Admit: 2016-08-23 | Discharge: 2016-08-23 | Disposition: A | Discharge status: Home / Self Care | Payer: Self-pay | Attending: Emergency Medicine | Admitting: Emergency Medicine

## 2016-08-23 DIAGNOSIS — F17210 Nicotine dependence, cigarettes, uncomplicated: Secondary | ICD-10-CM | POA: Insufficient documentation

## 2016-08-23 DIAGNOSIS — K2970 Gastritis, unspecified, without bleeding: Secondary | ICD-10-CM | POA: Insufficient documentation

## 2016-08-23 HISTORY — DX: Lobulated, fused and horseshoe kidney: Q63.1

## 2016-08-23 LAB — CBC
HEMATOCRIT: 46.5 % (ref 40.0–52.0)
HEMOGLOBIN: 16.2 g/dL (ref 13.0–18.0)
MCH: 31.9 pg (ref 26.0–34.0)
MCHC: 34.9 g/dL (ref 32.0–36.0)
MCV: 91.4 fL (ref 80.0–100.0)
Platelets: 263 10*3/uL (ref 150–440)
RBC: 5.09 MIL/uL (ref 4.40–5.90)
RDW: 13.8 % (ref 11.5–14.5)
WBC: 11.4 10*3/uL — AB (ref 3.8–10.6)

## 2016-08-23 LAB — BASIC METABOLIC PANEL
ANION GAP: 11 (ref 5–15)
BUN: 13 mg/dL (ref 6–20)
CHLORIDE: 99 mmol/L — AB (ref 101–111)
CO2: 23 mmol/L (ref 22–32)
Calcium: 10.1 mg/dL (ref 8.9–10.3)
Creatinine, Ser: 0.93 mg/dL (ref 0.61–1.24)
GFR calc Af Amer: >60 mL/min (ref >60)
Glucose, Bld: 76 mg/dL (ref 65–99)
POTASSIUM: 4 mmol/L (ref 3.5–5.1)
SODIUM: 133 mmol/L — AB (ref 135–145)

## 2016-08-23 LAB — URINALYSIS COMPLETE WITH MICROSCOPIC (ARMC ONLY)
BILIRUBIN URINE: NEGATIVE
Bacteria, UA: NONE SEEN
Glucose, UA: NEGATIVE mg/dL
Hgb urine dipstick: NEGATIVE
KETONES UR: NEGATIVE mg/dL
LEUKOCYTES UA: NEGATIVE
NITRITE: NEGATIVE
PROTEIN: NEGATIVE mg/dL
RBC / HPF: NONE SEEN RBC/hpf (ref 0–5)
SQUAMOUS EPITHELIAL / LPF: NONE SEEN
Specific Gravity, Urine: 1.001 — ABNORMAL LOW (ref 1.005–1.030)
pH: 6 (ref 5.0–8.0)

## 2016-08-23 LAB — TROPONIN I: Troponin I: <0.03 ng/mL (ref <0.03)

## 2016-08-23 MED ORDER — GI COCKTAIL ~~LOC~~
ORAL | Status: AC
  Administered 2016-08-23: 30 mL via ORAL
  Filled 2016-08-23: qty 30

## 2016-08-23 MED ORDER — PROCHLORPERAZINE MALEATE 10 MG PO TABS: 10.0000 mg | ORAL_TABLET | Freq: Three times a day (TID) | ORAL | 0 refills | Status: DC | PRN

## 2016-08-23 MED ORDER — GI COCKTAIL ~~LOC~~
30.0000 mL | Freq: Once | ORAL | Status: AC
  Administered 2016-08-23: 30 mL via ORAL

## 2016-08-23 MED ORDER — SUCRALFATE 1 G PO TABS: 1.0000 g | ORAL_TABLET | Freq: Four times a day (QID) | ORAL | 0 refills | Status: DC

## 2016-08-23 NOTE — Discharge Instructions (Signed)
Please seek medical attention for any high fevers, chest pain, shortness of breath, change in behavior, persistent vomiting, bloody stool or any other new or concerning symptoms.  

## 2016-08-23 NOTE — ED Triage Notes (Signed)
Pt arrives to ER via POV c/o chest tightness, lower and mid back pain that began 1 hour PTA. Pt denies injury. Pt alert and oriented X4, active, cooperative, pt in NAD. RR even and unlabored, color WNL.

## 2016-08-23 NOTE — ED Notes (Signed)
Pt reports mid sternal chest pain that radiates to his back  Pt reports that the pain began approx 1 hour ago as he was getting ready to start the weed eater  Alert and oriented  NAD assessed

## 2016-08-23 NOTE — ED Provider Notes (Signed)
[facility] Emergency Department Provider Note    ____________________________________________   I have reviewed the triage vital signs and the nursing notes.   HISTORY  Chief Complaint Back Pain and Chest Pain   History limited by: Not Limited   [name] a 30 y.o. male who presents to the emergency department today because of concern for back pain. It started suddenly today. It is located in his mid back. It does not radiate. It feels like someone kicked him in the back. He had some nausea associated with it when it first started. He denies similar pain in the past. He doesn't have any shortness of breath. No chest pain. He did not feel like he had any fevers. He denies any similar pain in the past. He does state that he takes a lot of ibuprofen for muscle cramps.   Past Medical History:  Diagnosis Date  . Bipolar 1 disorder (HCC)   . Chronic headache   . 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 Horseshoe kidney     Patient Active Problem List   Diagnosis Date Noted  . Bipolar 1 disorder, mixed, severe (HCC) 04/22/2016  . Alcohol use disorder, mild, abuse 04/22/2016  . Suicidal ideation 04/22/2016  . Tobacco use disorder 04/22/2016  . 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 TONSILLECTOMY      Prior to Admission medications   Medication Sig Start Date End Date Taking? Authorizing Provider  albuterol (PROVENTIL HFA;VENTOLIN HFA) 108 (90 Base) MCG/ACT inhaler Inhale 2 puffs into the lungs every 6 (six) hours as needed for wheezing or shortness of breath. 04/23/16   Shari ProwsJolanta B Pucilowska, MD  benzonatate (TESSALON PERLES) 100 MG capsule Take 1 capsule (100 mg total) by mouth 3 (three) times daily as needed for cough. Patient not taking: Reported on 08/02/2016 06/29/16   Joni Reiningonald K Smith, PA-C  diazepam (VALIUM) 5 MG tablet Take 2.5 mg by mouth every 12 (twelve)  hours as needed for anxiety.     Historical Provider, MD  guaiFENesin (MUCINEX) 600 MG 12 hr tablet Take 1 tablet (600 mg total) by mouth 2 (two) times daily. Patient not taking: Reported on 08/02/2016 04/23/16   Shari ProwsJolanta B Pucilowska, MD  QUEtiapine (SEROQUEL) 25 MG tablet Take 25 mg by mouth at bedtime.    Historical Provider, MD  QUEtiapine (SEROQUEL) 25 MG tablet Take 1 tablet (25 mg total) by mouth at bedtime. 08/05/16 08/12/16  Darci Currentandolph N Brown, MD    Allergies Geodon [ziprasidone hcl]  No family history on file.  Social History Social History  Substance Use Topics  . Smoking status: Current Every Day Smoker    Packs/day: 4.50    Types: Cigarettes  . Smokeless tobacco: Former NeurosurgeonUser  . Alcohol use No    Review of Systems  Constitutional: Negative for fever. Cardiovascular: Negative for chest pain. Respiratory: Negative for shortness of breath. Gastrointestinal: Negative for abdominal pain, vomiting and diarrhea. Genitourinary: Negative for dysuria. Musculoskeletal: Positive for back pain Skin: Negative for rash. Neurological: Negative for headaches, focal weakness or numbness.   10-point ROS otherwise negative.  ____________________________________________   PHYSICAL EXAM:  VITAL SIGNS: ED Triage Vitals [08/23/16 1344]  Enc Vitals Group     BP (!) 146/87     Pulse Rate (!) 118     Resp 18     Temp 98.3 F (36.8 C)     Temp Source  Oral     SpO2 99 %     Weight 125 lb (56.7 kg)     Height 5\' 9"  (1.753 m)     Head Circumference      Peak Flow      Pain Score 7   Constitutional: Alert and oriented. Well appearing and in no distress. Eyes: Conjunctivae are normal. Normal extraocular movements. ENT   Head: Normocephalic and atraumatic.   Nose: No congestion/rhinnorhea.   Mouth/Throat: Mucous membranes are moist.   Neck: No stridor. Hematological/Lymphatic/Immunilogical: No cervical lymphadenopathy. Cardiovascular: Normal rate, regular rhythm.  No  murmurs, rubs, or gallops. Respiratory: Normal respiratory effort without tachypnea nor retractions. Breath sounds are clear and equal bilaterally. No wheezes/rales/rhonchi. Gastrointestinal: Soft and minimally tender in the epigastric region. No rebound. No guarding. Genitourinary: Deferred Musculoskeletal: Normal range of motion in all extremities. No lower extremity edema. No tenderness to palpation of the spine. Neurologic:  Normal speech and language. No gross focal neurologic deficits are appreciated.  Skin:  Skin is warm, dry and intact. No rash noted. Psychiatric: Mood and affect are normal. Speech and behavior are normal. Patient exhibits appropriate insight and judgment.  ____________________________________________    LABS (pertinent positives/negatives)  Labs Reviewed  BASIC METABOLIC PANEL - Abnormal; Notable for the following:       Result Value   Sodium 133 (*)    Chloride 99 (*)    All other components within normal limits  CBC - Abnormal; Notable for the following:    WBC 11.4 (*)    All other components within normal limits  URINALYSIS COMPLETEWITH MICROSCOPIC ([facility] ONLY) - Abnormal; Notable for the following:    Color, Urine COLORLESS (*)    APPearance CLEAR (*)    Specific Gravity, Urine 1.001 (*)    All other components within normal limits  TROPONIN I     ____________________________________________   EKG  I, Phineas Semen, attending physician, personally viewed and interpreted this EKG  EKG Time: 1349 Rate: 69 Rhythm: normal sinus rhythm Axis: normal Intervals: qtc 377 QRS: incomplete RBBB ST changes: no st elevation Impression: abnormal ekg   ____________________________________________    RADIOLOGY  CXR IMPRESSION:  No active cardiopulmonary disease.    ____________________________________________   PROCEDURES  Procedures  ____________________________________________   INITIAL IMPRESSION / ASSESSMENT AND PLAN / ED  COURSE  Pertinent labs & imaging results that were available during my care of the patient were reviewed by me and considered in my medical decision making (see chart for details).  Patient presented to the emergency department today because of concerns for back pain. On exam that he is mildly tender in the epigastric region. No tenderness on palpation of the spine. Discussed with patient it sounds like he does take a lot of ibuprofen to help with headaches. It initially had been a alcohol abuser although states he has cut down significantly recently. A GI cocktail was tried and did give some relief. This point I think gastritis likely. I advised patient on gastritis diet and will give sucralfate. ____________________________________________   FINAL CLINICAL IMPRESSION(S) / ED DIAGNOSES  Final diagnoses:  Gastritis     Note: This dictation was prepared with Dragon dictation. Any transcriptional errors that result from this process are unintentional    Phineas Semen, MD 08/23/16 1531

## 2016-09-01 ENCOUNTER — Emergency Department
Admission: EM | Admit: 2016-09-01 | Discharge: 2016-09-01 | Disposition: A | Discharge status: Home / Self Care | Payer: Self-pay | Attending: Emergency Medicine | Admitting: Emergency Medicine

## 2016-09-01 ENCOUNTER — Emergency Department: Payer: Self-pay

## 2016-09-01 DIAGNOSIS — R0789 Other chest pain: Secondary | ICD-10-CM | POA: Insufficient documentation

## 2016-09-01 DIAGNOSIS — Z79899 Other long term (current) drug therapy: Secondary | ICD-10-CM | POA: Insufficient documentation

## 2016-09-01 DIAGNOSIS — F17210 Nicotine dependence, cigarettes, uncomplicated: Secondary | ICD-10-CM | POA: Insufficient documentation

## 2016-09-01 LAB — CBC
HEMATOCRIT: 48.3 % (ref 40.0–52.0)
HEMOGLOBIN: 16.6 g/dL (ref 13.0–18.0)
MCH: 31.7 pg (ref 26.0–34.0)
MCHC: 34.3 g/dL (ref 32.0–36.0)
MCV: 92.5 fL (ref 80.0–100.0)
Platelets: 235 10*3/uL (ref 150–440)
RBC: 5.23 MIL/uL (ref 4.40–5.90)
RDW: 14.4 % (ref 11.5–14.5)
WBC: 8.2 10*3/uL (ref 3.8–10.6)

## 2016-09-01 LAB — COMPREHENSIVE METABOLIC PANEL
ALK PHOS: 56 U/L (ref 38–126)
ALT: 91 U/L — ABNORMAL HIGH (ref 17–63)
ANION GAP: 9 (ref 5–15)
AST: 74 U/L — ABNORMAL HIGH (ref 15–41)
Albumin: 4.8 g/dL (ref 3.5–5.0)
BILIRUBIN TOTAL: 0.7 mg/dL (ref 0.3–1.2)
BUN: 11 mg/dL (ref 6–20)
CALCIUM: 10 mg/dL (ref 8.9–10.3)
CO2: 24 mmol/L (ref 22–32)
Chloride: 103 mmol/L (ref 101–111)
Creatinine, Ser: 0.7 mg/dL (ref 0.61–1.24)
GFR calc non Af Amer: >60 mL/min (ref >60)
GLUCOSE: 96 mg/dL (ref 65–99)
Potassium: 4.4 mmol/L (ref 3.5–5.1)
Sodium: 136 mmol/L (ref 135–145)
TOTAL PROTEIN: 8.6 g/dL — AB (ref 6.5–8.1)

## 2016-09-01 LAB — TROPONIN I
Troponin I: 0.04 ng/mL (ref <0.03)
Troponin I: <0.03 ng/mL (ref <0.03)

## 2016-09-01 MED ORDER — ASPIRIN 81 MG PO CHEW
324.0000 mg | CHEWABLE_TABLET | Freq: Once | ORAL | Status: DC
  Filled 2016-09-01: qty 4

## 2016-09-01 NOTE — Discharge Instructions (Signed)

## 2016-09-01 NOTE — ED Triage Notes (Signed)
Pt arrives via [facility] with reports of chest pain today  Pt also reports awakening during the night and he was "paralyzed"   Pt talkative and anxious in triage  VSS

## 2016-09-01 NOTE — ED Provider Notes (Signed)
[facility] Emergency Department Provider Note  ____________________________________________   First MD Initiated Contact with Patient 09/01/16 1627     (approximate)  I have reviewed the triage vital signs and the nursing notes.   HISTORY  Chief Complaint Chest Pain and Palpitations    HPI [facility] Criger is a 30 y.o. male with a PMH as described below who presents for evaluation of chest pain.  Seen recently for back pain and abdominal pain (which improved with GI cocktail).   Describes pain as sharp, stabbing, mild to moderation, worse on right side of anterior chest wall.  Reproducible with palpation.  Denies cocaine, ETOH.  +Tobacco, +MJ.  Also reports he awoke during the night and could not move "anything."  Those symptoms resolved.   Past Medical History:  Diagnosis Date  . Bipolar 1 disorder (HCC)   . Chronic headache   . 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 Horseshoe kidney     Patient Active Problem List   Diagnosis Date Noted  . Bipolar 1 disorder, mixed, severe (HCC) 04/22/2016  . Alcohol use disorder, mild, abuse 04/22/2016  . Suicidal ideation 04/22/2016  . Tobacco use disorder 04/22/2016  . 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 TONSILLECTOMY      Prior to Admission medications   Medication Sig Start Date End Date Taking? Authorizing Provider  diazepam (VALIUM) 5 MG tablet Take 2.5 mg by mouth every 12 (twelve) hours as needed for anxiety.    Yes Historical Provider, MD  prochlorperazine (COMPAZINE) 10 MG tablet Take 1 tablet (10 mg total) by mouth every 8 (eight) hours as needed (headache). 08/23/16  Yes Phineas Semen, MD  sucralfate (CARAFATE) 1 g tablet Take 1 tablet (1 g total) by mouth 4 (four) times daily. 08/23/16  Yes Phineas Semen, MD  traZODone (DESYREL) 50 MG tablet Take 25 mg by mouth at bedtime.   Yes Historical Provider, MD     Allergies Geodon [ziprasidone hcl]  No family history on file.  Social History Social History  Substance Use Topics  . Smoking status: Current Every Day Smoker    Packs/day: 4.50    Types: Cigarettes  . Smokeless tobacco: Former Neurosurgeon  . Alcohol use No    Review of Systems Constitutional: No fever/chills Eyes: No visual changes. ENT: No sore throat. Cardiovascular: +chest pain. Respiratory: Denies shortness of breath. Gastrointestinal: No abdominal pain.  No nausea, no vomiting.  No diarrhea.  No constipation. Genitourinary: Negative for dysuria. Musculoskeletal: Negative for back pain. Skin: Negative for rash. Neurological: Negative for headaches, focal weakness or numbness.  10-point ROS otherwise negative.  ____________________________________________   PHYSICAL EXAM:  VITAL SIGNS: ED Triage Vitals  Enc Vitals Group     BP 09/01/16 1351 (!) 120/95     Pulse Rate 09/01/16 1351 92     Resp 09/01/16 1351 (!) 21     Temp 09/01/16 1351 97.8 F (36.6 C)     Temp Source 09/01/16 1351 Oral     SpO2 09/01/16 1351 97 %     Weight 09/01/16 1352 126 lb (57.2 kg)     Height 09/01/16 1352 5\' 9"  (1.753 m)     Head Circumference --      Peak Flow --      Pain Score 09/01/16 1352 8     Pain Loc --      Pain Edu? --  Excl. in GC? --     Constitutional: Alert and oriented. Well appearing and in no acute distress. Eyes: Conjunctivae are normal. PERRL. EOMI. Head: Atraumatic. Nose: No congestion/rhinnorhea. Mouth/Throat: Mucous membranes are moist.  Oropharynx non-erythematous. Neck: No stridor.  No meningeal signs.   Cardiovascular: Normal rate, regular rhythm. Good peripheral circulation. Grossly normal heart sounds. Reproducible right sided anterior chest wall tenderness. Respiratory: Normal respiratory effort.  No retractions. Lungs CTAB. Gastrointestinal: Soft and nontender. No distention.  Musculoskeletal: No lower extremity tenderness nor edema. No gross  deformities of extremities. Neurologic:  Normal speech and language. No gross focal neurologic deficits are appreciated.  Skin:  Skin is warm, dry and intact. No rash noted. Psychiatric: Mood and affect are normal. Speech and behavior are normal.  ____________________________________________   LABS (all labs ordered are listed, but only abnormal results are displayed)  Labs Reviewed  COMPREHENSIVE METABOLIC PANEL - Abnormal; Notable for the following:       Result Value   Total Protein 8.6 (*)    AST 74 (*)    ALT 91 (*)    All other components within normal limits  TROPONIN I - Abnormal; Notable for the following:    Troponin I 0.04 (*)    All other components within normal limits  CBC  TROPONIN I   ____________________________________________  EKG  ED ECG REPORT I, Naarah Borgerding, the attending physician, personally viewed and interpreted this ECG.  Date: 09/01/2016 EKG Time: 13:58 Rate: 103 Rhythm: sinus tachycardia QRS Axis: normal Intervals: normal ST/T Wave abnormalities: normal Conduction Disturbances: none Narrative Interpretation: unremarkable  ____________________________________________  RADIOLOGY   Dg Chest 2 View  Result Date: 09/01/2016 CLINICAL DATA:  Substernal chest pain radiating into left arm for 1 hour today. EXAM: CHEST  2 VIEW COMPARISON:  Patient lateral chest 08/23/2016. Single-view of the chest 06/11/2011. CT chest 05/24/2011. FINDINGS: The lungs are clear. No pneumothorax or pleural effusion. Heart size is normal. No focal bony abnormality. IMPRESSION: Negative chest. Electronically Signed   By: Drusilla Kannerhomas  Dalessio M.D.   On: 09/01/2016 14:46    ____________________________________________   PROCEDURES  Procedure(s) performed:   Procedures   Critical Care performed: No ____________________________________________   INITIAL IMPRESSION / ASSESSMENT AND PLAN / ED COURSE  Pertinent labs & imaging results that were available during my  care of the patient were reviewed by me and considered in my medical decision making (see chart for details).  The patient first troponin that was 0.04 but the second one was less than 0.03.  He is at very low risk for acute or emergent causes of chest pain.  He is PERC negative.  Suspect musculoskeletal pain/costochondritis.  The patient is comfortable with plan for outpatient follow-up.    I gave my usual and customary return precautions.      ____________________________________________  FINAL CLINICAL IMPRESSION(S) / ED DIAGNOSES  Final diagnoses:  Atypical chest pain     MEDICATIONS GIVEN DURING THIS VISIT:  Medications  aspirin chewable tablet 324 mg (324 mg Oral Not Given 09/01/16 1630)     NEW OUTPATIENT MEDICATIONS STARTED DURING THIS VISIT:  New Prescriptions   No medications on file    Modified Medications   No medications on file    Discontinued Medications   ALBUTEROL (PROVENTIL HFA;VENTOLIN HFA) 108 (90 BASE) MCG/ACT INHALER    Inhale 2 puffs into the lungs every 6 (six) hours as needed for wheezing or shortness of breath.   BENZONATATE (TESSALON PERLES) 100 MG CAPSULE  Take 1 capsule (100 mg total) by mouth 3 (three) times daily as needed for cough.   GUAIFENESIN (MUCINEX) 600 MG 12 HR TABLET    Take 1 tablet (600 mg total) by mouth 2 (two) times daily.   QUETIAPINE (SEROQUEL) 25 MG TABLET    Take 25 mg by mouth at bedtime.   QUETIAPINE (SEROQUEL) 25 MG TABLET    Take 1 tablet (25 mg total) by mouth at bedtime.     Note:  This document was prepared using Dragon voice recognition software and may include unintentional dictation errors.    Loleta Rose, MD 09/01/16 [phone]

## 2016-09-07 DIAGNOSIS — R0782 Intercostal pain: Secondary | ICD-10-CM | POA: Insufficient documentation

## 2016-09-23 ENCOUNTER — Emergency Department
Admission: EM | Admit: 2016-09-23 | Discharge: 2016-09-23 | Disposition: A | Discharge status: Home / Self Care | Payer: Self-pay | Attending: Emergency Medicine | Admitting: Emergency Medicine

## 2016-09-23 DIAGNOSIS — F10129 Alcohol abuse with intoxication, unspecified: Secondary | ICD-10-CM | POA: Insufficient documentation

## 2016-09-23 DIAGNOSIS — F17210 Nicotine dependence, cigarettes, uncomplicated: Secondary | ICD-10-CM | POA: Insufficient documentation

## 2016-09-23 DIAGNOSIS — Z79899 Other long term (current) drug therapy: Secondary | ICD-10-CM | POA: Insufficient documentation

## 2016-09-23 DIAGNOSIS — F10920 Alcohol use, unspecified with intoxication, uncomplicated: Secondary | ICD-10-CM

## 2016-09-23 MED ORDER — ONDANSETRON HCL 4 MG/2ML IJ SOLN
4.0000 mg | INTRAMUSCULAR | Status: AC
  Administered 2016-09-23: 4 mg via INTRAVENOUS
  Filled 2016-09-23: qty 2

## 2016-09-23 NOTE — ED Provider Notes (Signed)
[facility] Emergency Department Provider Note  ____________________________________________   First MD Initiated Contact with Patient 09/23/16 0107     (approximate)  I have reviewed the triage vital signs and the nursing notes.   HISTORY  Chief Complaint Alcohol Intoxication (Pt. here from home via EMS for alcohol intoxication.)  History is limited by the patient's acute intoxication  HPI [name] is a 30 y.o. male with a history of chronic alcohol abuse results for evaluation of intoxication.  He called EMS from home because he has been sober for 6 months but then relapsed tonight and drank a large amount of alcohol.  He became nauseated and vomited and is mostly upset with himself although he denies suicidal ideation and homicidal ideation.  He thought maybe he should just get checked out.  He describes intoxication is severe although he is alert and oriented, speaking coherently but slurring his speech.  We have not given him the chance to walk unassisted to make sure he does not fall.  He denies headache, chest pain, shortness of breath, abdominal pain although he did have some mild right-sided abdominal pain earlier before he vomited.  He has had at least one episode of emesis.    Past Medical History:  Diagnosis Date  . Bipolar 1 disorder (HCC)   . Chronic headache   . 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 Horseshoe kidney     Patient Active Problem List   Diagnosis Date Noted  . Bipolar 1 disorder, mixed, severe (HCC) 04/22/2016  . Alcohol use disorder, mild, abuse 04/22/2016  . Suicidal ideation 04/22/2016  . Tobacco use disorder 04/22/2016  . 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 TONSILLECTOMY      Prior to Admission medications   Medication Sig Start Date End Date Taking? Authorizing Provider  diazepam (VALIUM) 5 MG tablet Take 2.5 mg by mouth  every 12 (twelve) hours as needed for anxiety.     Historical Provider, MD  prochlorperazine (COMPAZINE) 10 MG tablet Take 1 tablet (10 mg total) by mouth every 8 (eight) hours as needed (headache). 08/23/16   Phineas Semen, MD  sucralfate (CARAFATE) 1 g tablet Take 1 tablet (1 g total) by mouth 4 (four) times daily. 08/23/16   Phineas Semen, MD  traZODone (DESYREL) 50 MG tablet Take 25 mg by mouth at bedtime.    Historical Provider, MD    Allergies Geodon [ziprasidone hcl]  No family history on file.  Social History Social History  Substance Use Topics  . Smoking status: Current Every Day Smoker    Packs/day: 4.50    Types: Cigarettes  . Smokeless tobacco: Former Neurosurgeon  . Alcohol use No    Review of Systems Constitutional: No fever/chills Eyes: No visual changes. ENT: No sore throat. Cardiovascular: Denies chest pain. Respiratory: Denies shortness of breath. Gastrointestinal: No abdominal pain.  +N/V.  No diarrhea.  No constipation. Genitourinary: Negative for dysuria. Musculoskeletal: Negative for back pain. Skin: Negative for rash. Neurological: Negative for headaches, focal weakness or numbness.  10-point ROS otherwise negative.  ____________________________________________   PHYSICAL EXAM:  ED Triage Vitals  Enc Vitals Group     BP 09/23/16 0109 125/89     Pulse Rate 09/23/16 0109 (!) 101     Resp 09/23/16 0109 18     Temp --      Temp src --      SpO2  09/23/16 0107 98 %     Weight 09/23/16 0117 140 lb (63.5 kg)     Height 09/23/16 0117 5\' 9"  (1.753 m)     Head Circumference --      Peak Flow --      Pain Score --      Pain Loc --      Pain Edu? --      Excl. in GC? --      Constitutional: Alert and oriented but obviously intoxicated. No acute distress. Eyes: Conjunctivae are normal. PERRL. EOMI. Head: Atraumatic. Nose: No congestion/rhinnorhea. Mouth/Throat: Mucous membranes are moist.  Oropharynx non-erythematous. Neck: No stridor.  No meningeal  signs.   Cardiovascular: Initial borderline tachycardia has resolved, regular rhythm. Good peripheral circulation. Grossly normal heart sounds. Respiratory: Normal respiratory effort.  No retractions. Lungs CTAB. Gastrointestinal: Soft and nontender. No distention.  Musculoskeletal: No lower extremity tenderness nor edema. No gross deformities of extremities. Neurologic:  Normal speech and language. No gross focal neurologic deficits are appreciated.  Skin:  Skin is warm, dry and intact. No rash noted. Psychiatric: Mood and affect are normal. Speech and behavior are normal.  ____________________________________________   LABS (all labs ordered are listed, but only abnormal results are displayed)  Labs Reviewed - No data to display ____________________________________________  EKG  None - EKG not ordered by ED physician ____________________________________________  RADIOLOGY   No results found.  ____________________________________________   PROCEDURES  Procedure(s) performed:   Procedures   Critical Care performed: No ____________________________________________   INITIAL IMPRESSION / ASSESSMENT AND PLAN / ED COURSE  Pertinent labs & imaging results that were available during my care of the patient were reviewed by me and considered in my medical decision making (see chart for details).  And is intoxicated but has no evidence of acute or emergent illness.  There is no indication for labs.  He is asking for something to eat and we will give him something to eat (as well as some Zofran) while attempting to locate a responsible driver.  His medical screening exam is reassuring and there is no indication for further workup at this time.   Clinical Course  Comment By Time  Patient states he feels much better and is ready to go home.  Since he is likely still intoxicated we still need to find a sober adult to transport him. Loleta Roseory Lulamae Skorupski, MD 10/05 0533     ____________________________________________  FINAL CLINICAL IMPRESSION(S) / ED DIAGNOSES  Final diagnoses:  Alcoholic intoxication without complication (HCC)     MEDICATIONS GIVEN DURING THIS VISIT:  Medications  ondansetron (ZOFRAN) injection 4 mg (4 mg Intravenous Given 09/23/16 0129)     NEW OUTPATIENT MEDICATIONS STARTED DURING THIS VISIT:  New Prescriptions   No medications on file    Modified Medications   No medications on file    Discontinued Medications   No medications on file     Note:  This document was prepared using Dragon voice recognition software and may include unintentional dictation errors.    Loleta Roseory Hennessey Cantrell, MD 09/23/16 [phone]

## 2016-09-23 NOTE — ED Triage Notes (Signed)
Pt. Here via EMS from home for alcohol intoxication.

## 2016-09-23 NOTE — ED Notes (Signed)
Patient given meal tray and cup of milk

## 2016-09-23 NOTE — ED Notes (Signed)
Pt. Here from home for ETOH intoxication.  Pt. States he called EMS, because Pt. Stated "I think I over did it tonight".  Pt. States he has been sober for the past 6 months.  Pt. Denies any event happened tonight to make him drink.  Pt. Stated "I just felt like drinking".  Pt. States he has hep-c and will be receiving treatment in December.  Pt. States he does not want detox and he has no behavior problem.  Pt. Requested something to eat.  Pt. Given meal tray.

## 2016-09-23 NOTE — ED Notes (Signed)
Pt. Called a friend to pick him up in waiting room.

## 2016-09-23 NOTE — Discharge Instructions (Signed)
You were seen in the emergency department for alcohol intoxication.  Please seek help from the recommended resources for assistance with your alcohol dependence.  If you have any thoughts of hurting herself or others, please call 911 or return to the emergency department.  Please avoid drug and alcohol use.  Never drive a vehicle or operate machinery while intoxicated.  

## 2016-10-07 ENCOUNTER — Emergency Department
Admission: EM | Admit: 2016-10-07 | Discharge: 2016-10-08 | Disposition: A | Discharge status: Home / Self Care | Payer: Self-pay | Attending: Emergency Medicine | Admitting: Emergency Medicine

## 2016-10-07 DIAGNOSIS — F10920 Alcohol use, unspecified with intoxication, uncomplicated: Secondary | ICD-10-CM

## 2016-10-07 DIAGNOSIS — F10120 Alcohol abuse with intoxication, uncomplicated: Secondary | ICD-10-CM | POA: Insufficient documentation

## 2016-10-07 DIAGNOSIS — Z79899 Other long term (current) drug therapy: Secondary | ICD-10-CM | POA: Insufficient documentation

## 2016-10-07 DIAGNOSIS — F17210 Nicotine dependence, cigarettes, uncomplicated: Secondary | ICD-10-CM | POA: Insufficient documentation

## 2016-10-07 LAB — URINE DRUG SCREEN, QUALITATIVE (ARMC ONLY)
AMPHETAMINES, UR SCREEN: NOT DETECTED
BENZODIAZEPINE, UR SCRN: POSITIVE — AB
Barbiturates, Ur Screen: NOT DETECTED
CANNABINOID 50 NG, UR ~~LOC~~: POSITIVE — AB
Cocaine Metabolite,Ur ~~LOC~~: NOT DETECTED
MDMA (ECSTASY) UR SCREEN: NOT DETECTED
Methadone Scn, Ur: NOT DETECTED
Opiate, Ur Screen: NOT DETECTED
Phencyclidine (PCP) Ur S: NOT DETECTED
TRICYCLIC, UR SCREEN: NOT DETECTED

## 2016-10-07 LAB — CBC WITH DIFFERENTIAL/PLATELET
BASOS PCT: 1 %
Basophils Absolute: 0.1 10*3/uL (ref 0–0.1)
EOS ABS: 0.4 10*3/uL (ref 0–0.7)
Eosinophils Relative: 4 %
HCT: 46.8 % (ref 40.0–52.0)
HEMOGLOBIN: 15.8 g/dL (ref 13.0–18.0)
Lymphocytes Relative: 32 %
Lymphs Abs: 3 10*3/uL (ref 1.0–3.6)
MCH: 31.5 pg (ref 26.0–34.0)
MCHC: 33.7 g/dL (ref 32.0–36.0)
MCV: 93.3 fL (ref 80.0–100.0)
Monocytes Absolute: 1 10*3/uL (ref 0.2–1.0)
Monocytes Relative: 11 %
NEUTROS PCT: 52 %
Neutro Abs: 4.9 10*3/uL (ref 1.4–6.5)
Platelets: 269 10*3/uL (ref 150–440)
RBC: 5.02 MIL/uL (ref 4.40–5.90)
RDW: 14.3 % (ref 11.5–14.5)
WBC: 9.5 10*3/uL (ref 3.8–10.6)

## 2016-10-07 LAB — BASIC METABOLIC PANEL
Anion gap: 8 (ref 5–15)
BUN: 11 mg/dL (ref 6–20)
CHLORIDE: 101 mmol/L (ref 101–111)
CO2: 24 mmol/L (ref 22–32)
CREATININE: 0.65 mg/dL (ref 0.61–1.24)
Calcium: 8.8 mg/dL — ABNORMAL LOW (ref 8.9–10.3)
GFR calc non Af Amer: >60 mL/min (ref >60)
Glucose, Bld: 111 mg/dL — ABNORMAL HIGH (ref 65–99)
POTASSIUM: 4.3 mmol/L (ref 3.5–5.1)
SODIUM: 133 mmol/L — AB (ref 135–145)

## 2016-10-07 LAB — ETHANOL: ALCOHOL ETHYL (B): 279 mg/dL — AB (ref <5)

## 2016-10-07 NOTE — ED Triage Notes (Signed)
Per EMS: Pt was 6 months sober. Today pt drank 3/4 of a quart of 190 proof moonshine, took 150 mg Trazadone, and 30 mg Valium

## 2016-10-07 NOTE — ED Provider Notes (Signed)
[facility] Emergency Department Provider Note        Time seen: ----------------------------------------- 10:43 PM on 10/07/2016 -----------------------------------------    I have reviewed the triage vital signs and the nursing notes.   HISTORY  Chief Complaint No chief complaint on file.    HPI Nathan [name] is a 30 y.o. male who presents to ER being brought in for alcohol intoxication. Patient states he drank large amounts of 190 proof alcohol. Patient's concern he hurt his liver or melted his brain. He arrives ambulatory but somewhat unsteady. He denies any other injuries or complaints. He denies recent illness.   Past Medical History:  Diagnosis Date  . Bipolar 1 disorder (HCC)   . Chronic headache   . 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 Horseshoe kidney     Patient Active Problem List   Diagnosis Date Noted  . Bipolar 1 disorder, mixed, severe (HCC) 04/22/2016  . Alcohol use disorder, mild, abuse 04/22/2016  . Suicidal ideation 04/22/2016  . Tobacco use disorder 04/22/2016  . 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 TONSILLECTOMY      Allergies Geodon [ziprasidone hcl]  Social History Social History  Substance Use Topics  . Smoking status: Current Every Day Smoker    Packs/day: 4.50    Types: Cigarettes  . Smokeless tobacco: Former NeurosurgeonUser  . Alcohol use No    Review of Systems Constitutional: Negative for fever. Cardiovascular: Negative for chest pain. Respiratory: Negative for shortness of breath. Gastrointestinal: Negative for abdominal pain, vomiting and diarrhea. Skin: Negative for rash. Neurological: Negative for headaches, focal weakness or numbness.  10-point ROS otherwise negative.  ____________________________________________   PHYSICAL EXAM:  VITAL SIGNS: ED Triage Vitals  Enc Vitals Group     BP      Pulse      Resp       Temp      Temp src      SpO2      Weight      Height      Head Circumference      Peak Flow      Pain Score      Pain Loc      Pain Edu?      Excl. in GC?    Constitutional: Alert, intoxicated, no acute distress Eyes: Conjunctivae are normal. Normal extraocular movements. ENT   Head: Normocephalic and atraumatic.   Nose: No congestion/rhinnorhea.   Mouth/Throat: Mucous membranes are moist.   Neck: No stridor. Cardiovascular: Normal rate, regular rhythm. No murmurs, rubs, or gallops. Respiratory: Normal respiratory effort without tachypnea nor retractions. Breath sounds are clear and equal bilaterally. No wheezes/rales/rhonchi. Gastrointestinal: Soft and nontender. Normal bowel sounds Musculoskeletal: Nontender with normal range of motion in all extremities. No lower extremity tenderness nor edema. Neurologic:  Normal speech and language. No gross focal neurologic deficits are appreciated. Patient able to ambulate despite intoxication Skin:  Skin is warm, dry and intact. No rash noted. ___________________________________________  ED COURSE:  Pertinent labs & imaging results that were available during my care of the patient were reviewed by me and considered in my medical decision making (see chart for details). Clinical Course  Patient presents the ER after recent heavy alcohol intake. We will assess with basic labs and reevaluate.  Procedures ____________________________________________   LABS (pertinent positives/negatives)  Labs Reviewed  BASIC METABOLIC PANEL - Abnormal; Notable for the following:  Result Value   Sodium 133 (*)    Glucose, Bld 111 (*)    Calcium 8.8 (*)    All other components within normal limits  ETHANOL - Abnormal; Notable for the following:    Alcohol, Ethyl (B) 279 (*)    All other components within normal limits  URINE DRUG SCREEN, QUALITATIVE ([facility] ONLY) - Abnormal; Notable for the following:    Cannabinoid 50 Ng, Ur [place]  POSITIVE (*)    Benzodiazepine, Ur Scrn POSITIVE (*)    All other components within normal limits  CBC WITH DIFFERENTIAL/PLATELET    ____________________________________________  FINAL ASSESSMENT AND PLAN  Alcohol intoxication  Plan: Patient with labs as dictated above. Patient is in no acute distress, is intoxicated but able to ambulate. He'll be discharged to home when he sobers or calls and a ride to take him home.   Emily Filbert, MD   Note: This dictation was prepared with Dragon dictation. Any transcriptional errors that result from this process are unintentional    Emily Filbert, MD 10/07/16 [phone]

## 2016-10-08 MED ORDER — IBUPROFEN 800 MG PO TABS
800.0000 mg | ORAL_TABLET | Freq: Once | ORAL | Status: AC
  Administered 2016-10-08: 800 mg via ORAL

## 2016-10-08 MED ORDER — IBUPROFEN 800 MG PO TABS
ORAL_TABLET | ORAL | Status: AC
  Filled 2016-10-08: qty 1

## 2016-10-08 MED ORDER — ONDANSETRON HCL 4 MG/2ML IJ SOLN
INTRAMUSCULAR | Status: AC
  Administered 2016-10-08: 4 mg via INTRAVENOUS
  Filled 2016-10-08: qty 2

## 2016-10-08 MED ORDER — ONDANSETRON HCL 4 MG/2ML IJ SOLN
4.0000 mg | Freq: Once | INTRAMUSCULAR | Status: AC
  Administered 2016-10-08: 4 mg via INTRAVENOUS

## 2016-10-08 NOTE — ED Notes (Signed)
Reviewed d/c instructions and follow-up care with pt. Pt verbalized understanding. Pt in room waiting for ride. Pt requesting to wait outside for ride. RN explained to pt policy on intoxicated patients and discharge. Pt requesting to speak to Consulting civil engineerCharge RN. Charge RN Elon JesterMichele to pt's room.

## 2016-10-29 ENCOUNTER — Encounter: Payer: Self-pay | Admitting: Emergency Medicine

## 2016-10-29 ENCOUNTER — Emergency Department: Payer: Self-pay

## 2016-10-29 ENCOUNTER — Emergency Department
Admission: EM | Admit: 2016-10-29 | Discharge: 2016-10-29 | Disposition: A | Discharge status: Home / Self Care | Payer: Self-pay | Attending: Emergency Medicine | Admitting: Emergency Medicine

## 2016-10-29 DIAGNOSIS — R079 Chest pain, unspecified: Secondary | ICD-10-CM | POA: Insufficient documentation

## 2016-10-29 DIAGNOSIS — F17210 Nicotine dependence, cigarettes, uncomplicated: Secondary | ICD-10-CM | POA: Insufficient documentation

## 2016-10-29 DIAGNOSIS — R0602 Shortness of breath: Secondary | ICD-10-CM | POA: Insufficient documentation

## 2016-10-29 DIAGNOSIS — F1410 Cocaine abuse, uncomplicated: Secondary | ICD-10-CM | POA: Insufficient documentation

## 2016-10-29 DIAGNOSIS — Z79899 Other long term (current) drug therapy: Secondary | ICD-10-CM | POA: Insufficient documentation

## 2016-10-29 DIAGNOSIS — R42 Dizziness and giddiness: Secondary | ICD-10-CM | POA: Insufficient documentation

## 2016-10-29 LAB — COMPREHENSIVE METABOLIC PANEL
ALT: 85 U/L — AB (ref 17–63)
AST: 83 U/L — AB (ref 15–41)
Albumin: 3.6 g/dL (ref 3.5–5.0)
Alkaline Phosphatase: 52 U/L (ref 38–126)
Anion gap: 9 (ref 5–15)
BUN: 9 mg/dL (ref 6–20)
CHLORIDE: 101 mmol/L (ref 101–111)
CO2: 25 mmol/L (ref 22–32)
CREATININE: 0.58 mg/dL — AB (ref 0.61–1.24)
Calcium: 8.8 mg/dL — ABNORMAL LOW (ref 8.9–10.3)
GFR calc non Af Amer: >60 mL/min (ref >60)
Glucose, Bld: 83 mg/dL (ref 65–99)
Potassium: 3.6 mmol/L (ref 3.5–5.1)
SODIUM: 135 mmol/L (ref 135–145)
Total Bilirubin: 0.7 mg/dL (ref 0.3–1.2)
Total Protein: 6.7 g/dL (ref 6.5–8.1)

## 2016-10-29 LAB — CBC
HCT: 39.5 % — ABNORMAL LOW (ref 40.0–52.0)
Hemoglobin: 14.3 g/dL (ref 13.0–18.0)
MCH: 33.2 pg (ref 26.0–34.0)
MCHC: 36.2 g/dL — ABNORMAL HIGH (ref 32.0–36.0)
MCV: 91.6 fL (ref 80.0–100.0)
PLATELETS: 231 10*3/uL (ref 150–440)
RBC: 4.31 MIL/uL — AB (ref 4.40–5.90)
RDW: 14.4 % (ref 11.5–14.5)
WBC: 10.1 10*3/uL (ref 3.8–10.6)

## 2016-10-29 LAB — TROPONIN I: Troponin I: 0.04 ng/mL (ref <0.03)

## 2016-10-29 MED ORDER — ASPIRIN 81 MG PO CHEW
324.0000 mg | CHEWABLE_TABLET | Freq: Once | ORAL | Status: DC
  Filled 2016-10-29: qty 4

## 2016-10-29 MED ORDER — SODIUM CHLORIDE 0.9 % IV BOLUS (SEPSIS)
1000.0000 mL | Freq: Once | INTRAVENOUS | Status: AC
  Administered 2016-10-29: 1000 mL via INTRAVENOUS

## 2016-10-29 MED ORDER — LORAZEPAM 2 MG/ML IJ SOLN
1.0000 mg | Freq: Once | INTRAMUSCULAR | Status: AC
  Administered 2016-10-29: 1 mg via INTRAVENOUS

## 2016-10-29 MED ORDER — IPRATROPIUM-ALBUTEROL 0.5-2.5 (3) MG/3ML IN SOLN
3.0000 mL | Freq: Once | RESPIRATORY_TRACT | Status: DC
  Filled 2016-10-29: qty 3

## 2016-10-29 MED ORDER — SODIUM CHLORIDE 0.9 % IV BOLUS (SEPSIS): 1000.0000 mL | Freq: Once | INTRAVENOUS | Status: DC

## 2016-10-29 MED ORDER — LORAZEPAM 2 MG/ML IJ SOLN
1.0000 mg | Freq: Once | INTRAMUSCULAR | Status: AC
  Administered 2016-10-29: 1 mg via INTRAVENOUS
  Filled 2016-10-29: qty 1

## 2016-10-29 MED ORDER — LORAZEPAM 2 MG/ML IJ SOLN
INTRAMUSCULAR | Status: AC
  Administered 2016-10-29: 1 mg via INTRAVENOUS
  Filled 2016-10-29: qty 1

## 2016-10-29 NOTE — ED Notes (Signed)
Pt had 324 aspirin with EMS

## 2016-10-29 NOTE — ED Notes (Signed)
Pt refusing NS bolus and redraw troponin, states his troponin levels are always elevated and he feels better, states he wants to home, and is requesting phone to call dad, EDP made aware

## 2016-10-29 NOTE — ED Triage Notes (Signed)
Patient comes in via EMS for new onset chest pain in the central chest radiating to the left arm. Patient reported to EMS that he done about 3.5 grams of crack/cocaine last night. Patient also reports a headache and did take a BC powder with some relief. Patient also wanting some help with his depression. Patient denies SI/HI, reports being sad.

## 2016-10-29 NOTE — ED Notes (Signed)
Pt back from CT

## 2016-10-29 NOTE — ED Provider Notes (Signed)
[facility] Emergency Department Provider Note  ____________________________________________  Time seen: Approximately 10:11 AM  I have reviewed the triage vital signs and the nursing notes.   HISTORY  Chief Complaint Chest Pain   HPI [name] [name] is a 30 y.o. male history bipolar disorder, hepatitis C, active smoker, and substance abuse who presents for evaluation of chest pain. Patient reports that he had been clean from crack and cocaine for 2 years however he relapsed yesterday. He said that he smoked 3.5 g of crack cocaine throughout the night and this morning developed chest pain. He reports the chest pain as a pressure, located substernally, severe, radiating to his left arm, constant, associated with shortness of breath and mild dizziness. Patient reports that he felt like he was having a heart attack. Patient denies that this was a suicide attempt. He is endorsing that he has been dealing with a lot of stress recently. He denies SI. He continues to smoke cigarettes. He denies personal or family history of ischemic heart disease. He denies pain radiating to his back, numbness or paresthesias of his extremities.  Past Medical History:  Diagnosis Date  . Bipolar 1 disorder (HCC)   . Chronic headache   . 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 Horseshoe kidney     Patient Active Problem List   Diagnosis Date Noted  . Bipolar 1 disorder, mixed, severe (HCC) 04/22/2016  . Alcohol use disorder, mild, abuse 04/22/2016  . Suicidal ideation 04/22/2016  . Tobacco use disorder 04/22/2016  . 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 TONSILLECTOMY      Prior to Admission medications   Medication Sig Start Date End Date Taking? Authorizing Provider  diazepam (VALIUM) 5 MG tablet Take 2.5 mg by mouth every 12 (twelve) hours as needed for anxiety.    Yes Historical Provider, MD   traZODone (DESYREL) 50 MG tablet Take 25 mg by mouth at bedtime.   Yes Historical Provider, MD    Allergies Geodon [ziprasidone hcl]  History reviewed. No pertinent family history.  Social History Social History  Substance Use Topics  . Smoking status: Current Every Day Smoker    Packs/day: 4.50    Types: Cigarettes  . Smokeless tobacco: Former NeurosurgeonUser  . Alcohol use No    Review of Systems  Constitutional: Negative for fever. + lightheadedness Eyes: Negative for visual changes. ENT: Negative for sore throat. Cardiovascular: + chest pain. Respiratory: + shortness of breath. Gastrointestinal: Negative for abdominal pain, vomiting or diarrhea. Genitourinary: Negative for dysuria. Musculoskeletal: Negative for back pain. Skin: Negative for rash. Neurological: Negative for headaches, weakness or numbness.  ____________________________________________   PHYSICAL EXAM:  VITAL SIGNS: ED Triage Vitals  Enc Vitals Group     BP 10/29/16 0854 127/84     Pulse Rate 10/29/16 0854 97     Resp 10/29/16 0854 15     Temp 10/29/16 0854 98.1 F (36.7 C)     Temp Source 10/29/16 0854 Oral     SpO2 10/29/16 0851 100 %     Weight 10/29/16 0855 121 lb (54.9 kg)     Height 10/29/16 0855 5\' 9"  (1.753 m)     Head Circumference --      Peak Flow --      Pain Score --      Pain Loc --      Pain Edu? --  Excl. in GC? --     Constitutional: Alert and oriented. Well appearing and in no apparent distress. HEENT:      Head: Normocephalic and atraumatic.         Eyes: Conjunctivae are normal. Sclera is non-icteric. EOMI. PERRL      Mouth/Throat: Mucous membranes are moist.       Neck: Supple with no signs of meningismus. Cardiovascular: Regular rate and rhythm. No murmurs, gallops, or rubs. 2+ symmetrical distal pulses are present in all extremities. No JVD. Respiratory: Normal respiratory effort. Lungs are clear to auscultation bilaterally. No wheezes, crackles, or rhonchi.    Gastrointestinal: Soft, non tender, and non distended with positive bowel sounds. No rebound or guarding. Musculoskeletal: Nontender with normal range of motion in all extremities. No edema, cyanosis, or erythema of extremities. Neurologic: Normal speech and language. Face is symmetric. Moving all extremities. No gross focal neurologic deficits are appreciated. Skin: Skin is warm, dry and intact. No rash noted. Psychiatric: Mood and affect are normal. Speech and behavior are normal.  ____________________________________________   LABS (all labs ordered are listed, but only abnormal results are displayed)  Labs Reviewed  CBC - Abnormal; Notable for the following:       Result Value   RBC 4.31 (*)    HCT 39.5 (*)    MCHC 36.2 (*)    All other components within normal limits  TROPONIN I - Abnormal; Notable for the following:    Troponin I 0.04 (*)    All other components within normal limits  COMPREHENSIVE METABOLIC PANEL - Abnormal; Notable for the following:    Creatinine, Ser 0.58 (*)    Calcium 8.8 (*)    AST 83 (*)    ALT 85 (*)    All other components within normal limits  TROPONIN I   ____________________________________________  EKG  ED ECG REPORT I, Nita Sicklearolina Ilona Colley, the attending physician, personally viewed and interpreted this ECG.  Sinus tachycardia, rate of 105, normal intervals, normal axis, no ST elevations or depressions. Unchanged from prior.  ____________________________________________  RADIOLOGY  CXR: Hyperinflation consistent with reactive airway disease or COPD. There is no pneumonia, CHF, nor other acute cardiopulmonary abnormality.  ____________________________________________   PROCEDURES  Procedure(s) performed: None Procedures Critical Care performed:  None ____________________________________________   INITIAL IMPRESSION / ASSESSMENT AND PLAN / ED COURSE  30 y.o. male history bipolar disorder, hepatitis C, active smoker, and  substance abuse who presents for evaluation of chest pain in the setting of smoking 3.5g of crack/cocaine overnight. Patient is tachycardic but otherwise well appearing. EKG with no evidence of ischemia. CXR with no widened mediastinum or pneumothorax. Chest x-ray does show hyperinflation concerning for COPD. We'll give him a breathing treatment. Patient has received a dose of IV Ativan with improvement of his chest pain we'll repeat the dose. We'll give a full dose of aspirin. We'll cycle cardiac markers. We'll give him IV fluids. We'll monitor patient on telemetry.  Clinical Course as of Oct 30 1127  Caleen EssexFri Oct 29, 2016  1116 Patient reports full resolution of his symptoms and wishes to go home. His troponin is 0.04 which he has been in the past. I have discussed with him that without a second troponin I am unable to rule out an acute myocardial ischemia. Patient understands but still wishes to go home.   [CV]    Clinical Course User Index [CV] Nita Sicklearolina Lynda Wanninger, MD    10/29/2016 at 11:26 AM:  The patient requested to leave.  I considered this to be leaving against medical advice. I personally discussed the following with them:  1)  That they currently had a medical condition of chest pain and I am concerned that they may have ACS, dissection  2)  My proposed course of evaluation and treatment includes, but is not limited to continue Monitoring on telemetry and serial cardiac enzymes.  Benefits of staying include possible diagnosis or excluding of ACS or dissection which if identified early would lead to appropriate intervention in a timely manner lessening the burden of disability and death.  Risks of leaving before this had been completed include: misdiagnosis, worsening illness leading up to and including prolonged or permanent disability or death.  Specific risks pertinent, but not all inclusive, of their current medical condition include but are not limited to heart attack, heart failure,  cardiac arrest, dissection causing hemorrhagic shock.   Despite this they stated they wanted to leave due to personal problems and refused further evaluation, treatment, or admission at this time.   They appeared clinically sober, were mentating appropriately, were free from distracting injury, had adequately controlled acute pain, appeared to have intact insight, judgment, and reason, and in my opinion had the capacity to make this decision.  Specifically, they were able to verbally state back in a coherent manner their current medical condition/current diagnosis, the proposed course of evaluation and/or treatment, and the risks, benefits, and alternatives of treatment versus leaving against medical advice.   They understand that they may return to seek medical attention here at ANY time they want.  I strongly advised them to return to the Emergency Department immediately if they experience any new or worsening symptoms that concern them, or simply if they reconsider continued evaluation and/or treatment as previously discussed.  This would be without any repercussions, though they understand they likely will need to wait again in the Emergency Department if other patients are in front of them, rather than being brought straight back.  They understood this is another advantage of staying, but still insisted upon leaving.  I recommended they follow-up with his doctor at the earliest available opportunity/appointment for further evaluation and treatment.   The patient was discharged against medical advice.  They did accept written discharge instructions.    Pertinent labs & imaging results that were available during my care of the patient were reviewed by me and considered in my medical decision making (see chart for details).    ____________________________________________   FINAL CLINICAL IMPRESSION(S) / ED DIAGNOSES  Final diagnoses:  Chest pain, unspecified type  Cocaine abuse       NEW MEDICATIONS STARTED DURING THIS VISIT:  Discharge Medication List as of 10/29/2016 11:25 AM       Note:  This document was prepared using Dragon voice recognition software and may include unintentional dictation errors.    Nita Sickle, MD 10/29/16 1128

## 2016-10-29 NOTE — Discharge Instructions (Signed)
As I explained to you, we have not been able to rule out a chest pain as a heart attack because you decided to leave against medical advice.. Please feel free to return to the emergency room at any time to continue your evaluation. Call your doctor immediately for close follow up. Return to the emergency room if you have a severe headache, chest pain, shortness of breath, or any new symptoms that are concerning to you

## 2017-01-14 ENCOUNTER — Encounter: Payer: Self-pay | Admitting: Emergency Medicine

## 2017-01-14 ENCOUNTER — Emergency Department
Admission: EM | Admit: 2017-01-14 | Discharge: 2017-01-15 | Disposition: A | Discharge status: Home / Self Care | Payer: Self-pay | Attending: Emergency Medicine | Admitting: Emergency Medicine

## 2017-01-14 ENCOUNTER — Emergency Department: Payer: Self-pay

## 2017-01-14 DIAGNOSIS — Z79899 Other long term (current) drug therapy: Secondary | ICD-10-CM | POA: Insufficient documentation

## 2017-01-14 DIAGNOSIS — R0789 Other chest pain: Secondary | ICD-10-CM

## 2017-01-14 DIAGNOSIS — F419 Anxiety disorder, unspecified: Secondary | ICD-10-CM

## 2017-01-14 DIAGNOSIS — F1210 Cannabis abuse, uncomplicated: Secondary | ICD-10-CM | POA: Insufficient documentation

## 2017-01-14 DIAGNOSIS — F17210 Nicotine dependence, cigarettes, uncomplicated: Secondary | ICD-10-CM | POA: Insufficient documentation

## 2017-01-14 LAB — CBC
HEMATOCRIT: 41.2 % (ref 40.0–52.0)
HEMOGLOBIN: 14.7 g/dL (ref 13.0–18.0)
MCH: 31.3 pg (ref 26.0–34.0)
MCHC: 35.6 g/dL (ref 32.0–36.0)
MCV: 87.8 fL (ref 80.0–100.0)
Platelets: 254 10*3/uL (ref 150–440)
RBC: 4.69 MIL/uL (ref 4.40–5.90)
RDW: 13.9 % (ref 11.5–14.5)
WBC: 17.1 10*3/uL — ABNORMAL HIGH (ref 3.8–10.6)

## 2017-01-14 LAB — BASIC METABOLIC PANEL
ANION GAP: 10 (ref 5–15)
BUN: 10 mg/dL (ref 6–20)
CO2: 25 mmol/L (ref 22–32)
Calcium: 9.4 mg/dL (ref 8.9–10.3)
Chloride: 102 mmol/L (ref 101–111)
Creatinine, Ser: 0.73 mg/dL (ref 0.61–1.24)
GFR calc non Af Amer: >60 mL/min (ref >60)
GLUCOSE: 101 mg/dL — AB (ref 65–99)
POTASSIUM: 4.9 mmol/L (ref 3.5–5.1)
Sodium: 137 mmol/L (ref 135–145)

## 2017-01-14 LAB — TROPONIN I: Troponin I: <0.03 ng/mL (ref <0.03)

## 2017-01-14 MED ORDER — ACETAMINOPHEN 500 MG PO TABS
ORAL_TABLET | ORAL | Status: AC
  Administered 2017-01-14: 1000 mg via ORAL
  Filled 2017-01-14: qty 2

## 2017-01-14 MED ORDER — ACETAMINOPHEN 500 MG PO TABS
1000.0000 mg | ORAL_TABLET | Freq: Once | ORAL | Status: AC
  Administered 2017-01-14: 1000 mg via ORAL

## 2017-01-14 NOTE — ED Triage Notes (Signed)
C/O chest pain.  Onset of symptoms about one hour PTA.  States chest pain started after smoking marijuana.  C/O mid chest pain.  Patient is anxious.  Skin warm and dry. NAD

## 2017-01-14 NOTE — ED Triage Notes (Signed)
Pt ambulatory to stat desk w/ EMS. Pt in no acute respiratory distress, but is holding chest dramatically. Pt has non-productive cough that is weak to moderate in effort. EMS reports: Pt smoked marijuana from unknown source. Pt waked home from his friend's home where he smoked the marijuana and ate pizza before calling EMS.

## 2017-01-14 NOTE — ED Notes (Signed)
Pt c/o central chest pain that started after pt smoked pot, walked home, ate some pizza. Pt is alert and oriented x4. Pt also c/o of dizziness, lightheadedness, and some nausea.

## 2017-01-15 LAB — TROPONIN I: Troponin I: <0.03 ng/mL (ref <0.03)

## 2017-01-15 NOTE — ED Notes (Signed)
Pt. States he has no ride home available.

## 2017-01-15 NOTE — ED Provider Notes (Signed)
[facility] Emergency Department Provider Note  ____________________________________________   I have reviewed the triage vital signs and the nursing notes.   HISTORY  Chief Complaint Chest Pain    HPI Nathan [name] is a 31 y.o. male  with a history of polysubstance and alcohol abuse. States he's been sober for 36 days. Patient has a history of significant anxiety. He states he smoked "a blunt" and became very anxious and upset afterwards. He thinks he was having a panic attack. He denies any fever or chills. He states he got sweaty and anxious and his chest hurt and he had a headache. At this time, he thinks his symptoms are nearly gone. He is very worried that he might be having a heart attack however. The chest pain was very difficult for him to describe. Felt like a generalized tightness. He is not having it now. It started approximately 3 hours ago. The patient has had multiple similar episodes with his anxiety attacks. He denies history of ACS. Junk people in his family he denies history of PE or sudden cardiac death in young people. No personal or family history of PE or DVT. No recent travel no leg swelling. Patient states he feels anxious. He states that he is worried someone may be mixed something into the marijuana cigarette that he had. He is also very worried that we will not give him a cab to go home.     Past Medical History:  Diagnosis Date  . Bipolar 1 disorder (HCC)   . Chronic headache   . 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 Horseshoe kidney     Patient Active Problem List   Diagnosis Date Noted  . Bipolar 1 disorder, mixed, severe (HCC) 04/22/2016  . Alcohol use disorder, mild, abuse 04/22/2016  . Suicidal ideation 04/22/2016  . Tobacco use disorder 04/22/2016  . 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 TONSILLECTOMY      Prior to Admission  medications   Medication Sig Start Date End Date Taking? Authorizing Provider  diazepam (VALIUM) 5 MG tablet Take 2.5 mg by mouth every 12 (twelve) hours as needed for anxiety.     Historical Provider, MD  traZODone (DESYREL) 50 MG tablet Take 25 mg by mouth at bedtime.    Historical Provider, MD    Allergies Geodon [ziprasidone hcl]  No family history on file.  Social History Social History  Substance Use Topics  . Smoking status: Current Every Day Smoker    Packs/day: 4.50    Types: Cigarettes  . Smokeless tobacco: Former NeurosurgeonUser  . Alcohol use No    Review of Systems Constitutional: No fever/chills Eyes: No visual changes. ENT: No sore throat. No stiff neck no neck pain Cardiovascular: See history of present illness  Respiratory: Denies shortness of breath. Gastrointestinal:   no vomiting.  No diarrhea.  No constipation. Genitourinary: Negative for dysuria. Musculoskeletal: Negative lower extremity swelling Skin: Negative for rash. Neurological: Negative for severe headaches, focal weakness or numbness. 10-point ROS otherwise negative.  ____________________________________________   PHYSICAL EXAM:  VITAL SIGNS: ED Triage Vitals  Enc Vitals Group     BP 01/14/17 2215 122/69     Pulse Rate 01/14/17 2215 99     Resp 01/14/17 2215 18     Temp 01/14/17 2215 98.1 F (36.7 C)     Temp Source 01/14/17 2215 Oral     SpO2 01/14/17  2215 100 %     Weight 01/14/17 2211 125 lb (56.7 kg)     Height 01/14/17 2211 5\' 9"  (1.753 m)     Head Circumference --      Peak Flow --      Pain Score 01/14/17 2211 9     Pain Loc --      Pain Edu? --      Excl. in GC? --     Constitutional: Alert and oriented. Well appearing and in no acute distress.Patient is, however, quite anxious. Heart rate is 98 when I entered the room but as he talks to me I can want to go up to 120. As soon as I leave the room, on the monitors heart rate goes back down to 98. Aside from anxiety he is in no acute  distress  Eyes: Conjunctivae are normal. PERRL. EOMI. Head: Atraumatic. Nose: No congestion/rhinnorhea. Mouth/Throat: Mucous membranes are moist.  Oropharynx non-erythematous. Neck: No stridor.   Nontender with no meningismus Cardiovascular: Normal rate, regular rhythm. Grossly normal heart sounds.  Good peripheral circulation. Respiratory: Normal respiratory effort.  No retractions. Lungs CTAB. Abdominal: Soft and nontender. No distention. No guarding no rebound Back:  There is no focal tenderness or step off.  there is no midline tenderness there are no lesions noted. there is no CVA tenderness Musculoskeletal: No lower extremity tenderness, no upper extremity tenderness. No joint effusions, no DVT signs strong distal pulses no edema Neurologic:  Normal speech and language. No gross focal neurologic deficits are appreciated.  Skin:  Skin is warm, dry and intact. No rash noted. Psychiatric: Mood and affect are normal. Speech and behavior are normal.  ____________________________________________   LABS (all labs ordered are listed, but only abnormal results are displayed)  Labs Reviewed  BASIC METABOLIC PANEL - Abnormal; Notable for the following:       Result Value   Glucose, Bld 101 (*)    All other components within normal limits  CBC - Abnormal; Notable for the following:    WBC 17.1 (*)    All other components within normal limits  TROPONIN I   ____________________________________________  EKG  I personally interpreted any EKGs ordered by me or triage 2 EKGs were performed on this patient, the first shows sinus tachycardia rate 107, normal axis no acute ST elevation or depression nonspecific ST changes were old repolarization abnormality noted, and RSR prime configuration, repeat EKG done at 2343 shows the same, rate 100 no acute ischemic changes noted. ____________________________________________  RADIOLOGY  I reviewed any imaging ordered by me or triage that were  performed during my shift and, if possible, patient and/or family made aware of any abnormal findings. ____________________________________________   PROCEDURES  Procedure(s) performed: None  Procedures  Critical Care performed: None  ____________________________________________   INITIAL IMPRESSION / ASSESSMENT AND PLAN / ED COURSE  Pertinent labs & imaging results that were available during my care of the patient were reviewed by me and considered in my medical decision making (see chart for details).   Patient with very poor history of likely ACS PE or dissection. Very strong history of anxiety. He is very worried that he may have been some how subjected to a toxidrome from the marijuana cigarette that he smoked his afternoon. There is no evidence of psychosis no ongoing chest pain no evidence of toxidrome noted. Patient very well-appearing but quite anxious. Heart rate is in the 80s and 90s and less were in the room and then immediately goes  up. Patient states that just his nerves and I tend to agree with him. Don't think this represents a PE. Long history of repeated visits for similar.At this time, there does not appear to be clinical evidence to support the diagnosis of pulmonary embolus, dissection, myocarditis, endocarditis, pericarditis, pericardial tamponade, acute coronary syndrome, pneumothorax, pneumonia, or any other acute intrathoracic pathology that will require admission or acute intervention. Nor is there evidence of any significant intra-abdominal pathology causing this discomfort. I will, however, send a second troponin is were about due for one just to make sure and if that is negative we'll send him home I don't think he requires a CT scan. Of note, he was incidentally found to have a possible lung nodule. I've made him aware of this and the need for outpatient follow-up in her short period of time. This likely is not cancerous given the tiny dimensions and the fact that  they feel that it is most likely vesicular however we will advise outpatient follow-up and CT scan in May. Patient voices understanding of this.    ____________________________________________   FINAL CLINICAL IMPRESSION(S) / ED DIAGNOSES  Final diagnoses:  None      This chart was dictated using voice recognition software.  Despite best efforts to proofread,  errors can occur which can change meaning.      Jeanmarie Plant, MD 01/15/17 [phone]

## 2017-02-06 ENCOUNTER — Encounter: Payer: Self-pay | Admitting: Emergency Medicine

## 2017-02-06 ENCOUNTER — Emergency Department
Admission: EM | Admit: 2017-02-06 | Discharge: 2017-02-06 | Disposition: A | Discharge status: Home / Self Care | Payer: Self-pay | Attending: Emergency Medicine | Admitting: Emergency Medicine

## 2017-02-06 DIAGNOSIS — F1290 Cannabis use, unspecified, uncomplicated: Secondary | ICD-10-CM | POA: Insufficient documentation

## 2017-02-06 DIAGNOSIS — F419 Anxiety disorder, unspecified: Secondary | ICD-10-CM | POA: Insufficient documentation

## 2017-02-06 DIAGNOSIS — F17210 Nicotine dependence, cigarettes, uncomplicated: Secondary | ICD-10-CM | POA: Insufficient documentation

## 2017-02-06 DIAGNOSIS — F1490 Cocaine use, unspecified, uncomplicated: Secondary | ICD-10-CM | POA: Insufficient documentation

## 2017-02-06 LAB — CBC WITH DIFFERENTIAL/PLATELET
BASOS ABS: 0.1 10*3/uL (ref 0–0.1)
BASOS PCT: 1 %
Eosinophils Absolute: 0.1 10*3/uL (ref 0–0.7)
Eosinophils Relative: 2 %
HEMATOCRIT: 42 % (ref 40.0–52.0)
HEMOGLOBIN: 14.2 g/dL (ref 13.0–18.0)
LYMPHS PCT: 38 %
Lymphs Abs: 2.8 10*3/uL (ref 1.0–3.6)
MCH: 30.3 pg (ref 26.0–34.0)
MCHC: 33.9 g/dL (ref 32.0–36.0)
MCV: 89.3 fL (ref 80.0–100.0)
MONO ABS: 0.8 10*3/uL (ref 0.2–1.0)
Monocytes Relative: 11 %
NEUTROS ABS: 3.5 10*3/uL (ref 1.4–6.5)
NEUTROS PCT: 48 %
Platelets: 292 10*3/uL (ref 150–440)
RBC: 4.7 MIL/uL (ref 4.40–5.90)
RDW: 14.9 % — AB (ref 11.5–14.5)
WBC: 7.4 10*3/uL (ref 3.8–10.6)

## 2017-02-06 LAB — BASIC METABOLIC PANEL
ANION GAP: 9 (ref 5–15)
BUN: 8 mg/dL (ref 6–20)
CALCIUM: 9.3 mg/dL (ref 8.9–10.3)
CHLORIDE: 107 mmol/L (ref 101–111)
CO2: 23 mmol/L (ref 22–32)
Creatinine, Ser: 0.65 mg/dL (ref 0.61–1.24)
GFR calc non Af Amer: >60 mL/min (ref >60)
GLUCOSE: 93 mg/dL (ref 65–99)
POTASSIUM: 3.7 mmol/L (ref 3.5–5.1)
Sodium: 139 mmol/L (ref 135–145)

## 2017-02-06 NOTE — ED Provider Notes (Signed)
[facility] Emergency Department Provider Note    ____________________________________________   I have reviewed the triage vital signs and the nursing notes.   HISTORY  Chief Complaint Anxiety   History limited by: Not Limited   HPI Nathan [name] is a 31 y.o. male who presents to the emergency department today with concern for his anxiety and possible infection. Patient states that he does have a history of anxiety. It is been particularly bad the past 2 days. He states that he also has felt somewhat lightheaded and confused. He states this is been adding to his anxiety. He is very concerned that he has a severe infection and that he states his former partner died of bacteremia at the age of 30. Patient recently got over the flu which he was diagnosed with 2 weeks ago.   Past Medical History:  Diagnosis Date  . Bipolar 1 disorder (HCC)   . Chronic headache   . 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 Horseshoe kidney     Patient Active Problem List   Diagnosis Date Noted  . Bipolar 1 disorder, mixed, severe (HCC) 04/22/2016  . Alcohol use disorder, mild, abuse 04/22/2016  . Suicidal ideation 04/22/2016  . Tobacco use disorder 04/22/2016  . 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 TONSILLECTOMY      Prior to Admission medications   Medication Sig Start Date End Date Taking? Authorizing Provider  diazepam (VALIUM) 5 MG tablet Take 2.5 mg by mouth every 12 (twelve) hours as needed for anxiety.     Historical Provider, MD  traZODone (DESYREL) 50 MG tablet Take 25 mg by mouth at bedtime.    Historical Provider, MD    Allergies Geodon [ziprasidone hcl]  No family history on file.  Social History Social History  Substance Use Topics  . Smoking status: Current Every Day Smoker    Packs/day: 4.50    Types: Cigarettes  . Smokeless tobacco: Former NeurosurgeonUser  . Alcohol  use No    Review of Systems  Constitutional: Negative for fever. Cardiovascular: Positive for chest pain. Respiratory: Negative for shortness of breath. Gastrointestinal: Negative for abdominal pain, vomiting and diarrhea. Neurological: Negative for headaches, focal weakness or numbness. Psychiatry: Positive for anxiety 10-point ROS otherwise negative.  ____________________________________________   PHYSICAL EXAM:  VITAL SIGNS: ED Triage Vitals  Enc Vitals Group     BP 02/06/17 1905 128/86     Pulse Rate 02/06/17 1905 89     Resp 02/06/17 1905 18     Temp 02/06/17 1905 98.7 F (37.1 C)     Temp Source 02/06/17 1905 Oral     SpO2 02/06/17 1905 100 %     Weight 02/06/17 1907 125 lb (56.7 kg)     Height 02/06/17 1907 5\' 9"  (1.753 m)     Head Circumference --      Peak Flow --      Pain Score 02/06/17 1907 0   Constitutional: Alert and oriented. Appears slightly anxious. Eyes: Conjunctivae are normal. Normal extraocular movements. ENT   Head: Normocephalic and atraumatic.   Nose: No congestion/rhinnorhea.   Mouth/Throat: Mucous membranes are moist.   Neck: No stridor. Hematological/Lymphatic/Immunilogical: No cervical lymphadenopathy. Cardiovascular: Normal rate, regular rhythm.  No murmurs, rubs, or gallops. Respiratory: Normal respiratory effort without tachypnea nor retractions. Breath sounds are clear and equal bilaterally. No wheezes/rales/rhonchi. Gastrointestinal: Soft and non tender. No rebound. No guarding.  Genitourinary: Deferred Musculoskeletal: Normal range of motion in all extremities. No lower extremity edema. Neurologic:  Normal speech and language. No gross focal neurologic deficits are appreciated.  Skin:  Skin is warm, dry and intact. No rash noted. Psychiatric: Anxious. Denies any SI/HI.  ____________________________________________    LABS (pertinent positives/negatives)  Labs Reviewed  CBC WITH DIFFERENTIAL/PLATELET - Abnormal;  Notable for the following:       Result Value   RDW 14.9 (*)    All other components within normal limits  BASIC METABOLIC PANEL     ____________________________________________   EKG  None  ____________________________________________    RADIOLOGY  None  ____________________________________________   PROCEDURES  Procedures  ____________________________________________   INITIAL IMPRESSION / ASSESSMENT AND PLAN / ED COURSE  Pertinent labs & imaging results that were available during my care of the patient were reviewed by me and considered in my medical decision making (see chart for details).  She presented to the emergency department today with main complaint of being anxious about possibly having an infection. Appears systems from the fact that a loved one died of bacteremia. Exam patient appeared anxious but otherwise without any acute issues. Blood work did not show any elevation of his white blood cell count. This point I do not think that the patient's chest pain is related to anything other than anxiety. He has had multiple workups in the past here in this emergency department. Feel patient is safe for discharge to follow up with primary care.  ____________________________________________   FINAL CLINICAL IMPRESSION(S) / ED DIAGNOSES  Final diagnoses:  Anxiety     Note: This dictation was prepared with Dragon dictation. Any transcriptional errors that result from this process are unintentional     Phineas Semen, MD 02/06/17 2143

## 2017-02-06 NOTE — ED Triage Notes (Signed)
Per [facility], patient comes from home. Patient called out for a panic attack. When EMS arrived patient was in ST with a rate of 120. Patient tearful. Patient states, "i don't want to die like my wife. I am so afraid." Patients wife died a year ago after she was dx with the flu. EMS reports BP 80/40. 500 bolus started. Hep C +. Dx with the flu 2 weeks ago. A&O x4. Current BP 128/86.

## 2017-02-06 NOTE — Discharge Instructions (Signed)
Please seek medical attention for any high fevers, chest pain, shortness of breath, change in behavior, persistent vomiting, bloody stool or any other new or concerning symptoms.  

## 2017-02-06 NOTE — ED Notes (Signed)
Patient here for anxiety. See triage note. A&O x4. VSS.

## 2017-02-06 NOTE — ED Notes (Signed)
Pt. Going home with family. 

## 2017-03-12 ENCOUNTER — Emergency Department
Admission: EM | Admit: 2017-03-12 | Discharge: 2017-03-13 | Disposition: A | Discharge status: Home / Self Care | Payer: Self-pay | Attending: Emergency Medicine | Admitting: Emergency Medicine

## 2017-03-12 DIAGNOSIS — R51 Headache: Secondary | ICD-10-CM | POA: Insufficient documentation

## 2017-03-12 DIAGNOSIS — F17210 Nicotine dependence, cigarettes, uncomplicated: Secondary | ICD-10-CM | POA: Insufficient documentation

## 2017-03-12 DIAGNOSIS — R519 Headache, unspecified: Secondary | ICD-10-CM

## 2017-03-12 LAB — URINALYSIS, COMPLETE (UACMP) WITH MICROSCOPIC
BACTERIA UA: NONE SEEN
BILIRUBIN URINE: NEGATIVE
Glucose, UA: NEGATIVE mg/dL
Hgb urine dipstick: NEGATIVE
Ketones, ur: NEGATIVE mg/dL
Leukocytes, UA: NEGATIVE
Nitrite: NEGATIVE
Protein, ur: NEGATIVE mg/dL
RBC / HPF: NONE SEEN RBC/hpf (ref 0–5)
SPECIFIC GRAVITY, URINE: 1.019 (ref 1.005–1.030)
SQUAMOUS EPITHELIAL / LPF: NONE SEEN
pH: 5 (ref 5.0–8.0)

## 2017-03-12 LAB — CBC
HCT: 44.7 % (ref 40.0–52.0)
Hemoglobin: 15.8 g/dL (ref 13.0–18.0)
MCH: 32.1 pg (ref 26.0–34.0)
MCHC: 35.3 g/dL (ref 32.0–36.0)
MCV: 90.9 fL (ref 80.0–100.0)
PLATELETS: 319 10*3/uL (ref 150–440)
RBC: 4.91 MIL/uL (ref 4.40–5.90)
RDW: 15.1 % — AB (ref 11.5–14.5)
WBC: 9.1 10*3/uL (ref 3.8–10.6)

## 2017-03-12 LAB — BASIC METABOLIC PANEL
Anion gap: 8 (ref 5–15)
BUN: 14 mg/dL (ref 6–20)
CHLORIDE: 105 mmol/L (ref 101–111)
CO2: 25 mmol/L (ref 22–32)
CREATININE: 0.78 mg/dL (ref 0.61–1.24)
Calcium: 10 mg/dL (ref 8.9–10.3)
GFR calc Af Amer: >60 mL/min (ref >60)
GFR calc non Af Amer: >60 mL/min (ref >60)
GLUCOSE: 97 mg/dL (ref 65–99)
POTASSIUM: 4.1 mmol/L (ref 3.5–5.1)
Sodium: 138 mmol/L (ref 135–145)

## 2017-03-12 LAB — HEPATIC FUNCTION PANEL
ALT: 18 U/L (ref 17–63)
AST: 23 U/L (ref 15–41)
Albumin: 4.8 g/dL (ref 3.5–5.0)
Alkaline Phosphatase: 71 U/L (ref 38–126)
Bilirubin, Direct: <0.1 mg/dL — ABNORMAL LOW (ref 0.1–0.5)
Total Bilirubin: 0.8 mg/dL (ref 0.3–1.2)
Total Protein: 8.5 g/dL — ABNORMAL HIGH (ref 6.5–8.1)

## 2017-03-12 LAB — LIPASE, BLOOD: LIPASE: 15 U/L (ref 11–51)

## 2017-03-12 MED ORDER — DIPHENHYDRAMINE HCL 50 MG/ML IJ SOLN
25.0000 mg | Freq: Once | INTRAMUSCULAR | Status: AC
  Administered 2017-03-12: 25 mg via INTRAVENOUS
  Filled 2017-03-12: qty 1

## 2017-03-12 MED ORDER — KETOROLAC TROMETHAMINE 30 MG/ML IJ SOLN
15.0000 mg | INTRAMUSCULAR | Status: AC
  Administered 2017-03-12: 15 mg via INTRAVENOUS
  Filled 2017-03-12: qty 1

## 2017-03-12 MED ORDER — SODIUM CHLORIDE 0.9 % IV BOLUS (SEPSIS)
1000.0000 mL | Freq: Once | INTRAVENOUS | Status: AC
  Administered 2017-03-12: 1000 mL via INTRAVENOUS

## 2017-03-12 MED ORDER — METOCLOPRAMIDE HCL 5 MG/ML IJ SOLN
10.0000 mg | Freq: Once | INTRAMUSCULAR | Status: AC
  Administered 2017-03-12: 10 mg via INTRAVENOUS
  Filled 2017-03-12: qty 2

## 2017-03-12 MED ORDER — KETOROLAC TROMETHAMINE 10 MG PO TABS
10.0000 mg | ORAL_TABLET | Freq: Four times a day (QID) | ORAL | 0 refills | Status: DC | PRN
Start: 2017-03-12 — End: 2019-07-17

## 2017-03-12 MED ORDER — DIPHENHYDRAMINE HCL 25 MG PO CAPS: 50.0000 mg | ORAL_CAPSULE | Freq: Four times a day (QID) | ORAL | 0 refills | Status: DC | PRN

## 2017-03-12 NOTE — ED Provider Notes (Addendum)
[facility] Emergency Department Provider Note  ____________________________________________  Time seen: Approximately 11:45 PM  I have reviewed the triage vital signs and the nursing notes.   HISTORY  Chief Complaint Medication Reaction (Harvoni)    HPI Nathan RodHarmon [name] Dewaine [name] is a 31 y.o. male who complains of generalized headache and feeling like lights or flickering when he looks at them, and generalized fatigue. He relates this to the medication he is taking for hepatitis which she started taking 20 days ago. He just started a new bottle of it today. He's been eating and drinking normally. No fevers chills sweats vomiting or diarrhea. No abdominal pain. Symptoms are gradual onset.     Past Medical History:  Diagnosis Date  . Bipolar 1 disorder (HCC)   . Chronic headache   . 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 Horseshoe kidney      Patient Active Problem List   Diagnosis Date Noted  . Bipolar 1 disorder, mixed, severe (HCC) 04/22/2016  . Alcohol use disorder, mild, abuse 04/22/2016  . Suicidal ideation 04/22/2016  . Tobacco use disorder 04/22/2016  . 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 TONSILLECTOMY       Prior to Admission medications   Medication Sig Start Date End Date Taking? Authorizing Provider  Ledipasvir-Sofosbuvir (HARVONI) 90-400 MG TABS Take by mouth 1 day or 1 dose.   Yes Historical Provider, MD  diazepam (VALIUM) 5 MG tablet Take 2.5 mg by mouth every 12 (twelve) hours as needed for anxiety.     Historical Provider, MD  diphenhydrAMINE (BENADRYL) 25 mg capsule Take 2 capsules (50 mg total) by mouth every 6 (six) hours as needed. 03/12/17   Sharman CheekPhillip Varick Keys, MD  ketorolac (TORADOL) 10 MG tablet Take 1 tablet (10 mg total) by mouth every 6 (six) hours as needed for moderate pain. 03/12/17   Sharman CheekPhillip Ereka Brau, MD  traZODone (DESYREL) 50 MG tablet Take  25 mg by mouth at bedtime.    Historical Provider, MD     Allergies Geodon [ziprasidone hcl]   Family History  Problem Relation Age of Onset  . Hypertension Mother   . Diabetes Mother   . Hypertension Father   . Heart failure Father   . Diabetes Father     Social History Social History  Substance Use Topics  . Smoking status: Current Every Day Smoker    Packs/day: 1.00    Types: Cigarettes  . Smokeless tobacco: Former NeurosurgeonUser  . Alcohol use No    Review of Systems  Constitutional:   No fever or chills.  ENT:   No sore throat. No rhinorrhea. Cardiovascular:   No chest pain. Respiratory:   No dyspnea or cough. Gastrointestinal:   Negative for abdominal pain, vomiting and diarrhea.  Genitourinary:   Negative for dysuria or difficulty urinating. Musculoskeletal:   Negative for focal pain or swelling Neurological:   Positive as above for headaches 10-point ROS otherwise negative.  ____________________________________________   PHYSICAL EXAM:  VITAL SIGNS: ED Triage Vitals  Enc Vitals Group     BP 03/12/17 2120 (!) 149/97     Pulse Rate 03/12/17 2120 (!) 120     Resp 03/12/17 2120 20     Temp 03/12/17 2120 98.4 F (36.9 C)     Temp Source 03/12/17 2120 Oral     SpO2 03/12/17 2120 97 %     Weight 03/12/17 2120 125 lb (56.7  kg)     Height 03/12/17 2120 5\' 9"  (1.753 m)     Head Circumference --      Peak Flow --      Pain Score 03/12/17 2121 7     Pain Loc --      Pain Edu? --      Excl. in GC? --     Vital signs reviewed, nursing assessments reviewed.   Constitutional:   Alert and oriented. Well appearing and in no distress. Eyes:   No scleral icterus. No conjunctival pallor. PERRL. EOMI.  No nystagmus. ENT   Head:   Normocephalic and atraumatic.   Nose:   No congestion/rhinnorhea. No septal hematoma   Mouth/Throat:   MMM, no pharyngeal erythema. No peritonsillar mass.    Neck:   No stridor. No SubQ emphysema. No  meningismus. Hematological/Lymphatic/Immunilogical:   No cervical lymphadenopathy. Cardiovascular:   RRR. Symmetric bilateral radial and DP pulses.  No murmurs.  Respiratory:   Normal respiratory effort without tachypnea nor retractions. Breath sounds are clear and equal bilaterally. No wheezes/rales/rhonchi. Gastrointestinal:   Soft and nontender. Non distended. There is no CVA tenderness.  No rebound, rigidity, or guarding. Genitourinary:   deferred Musculoskeletal:   Normal range of motion in all extremities. No joint effusions.  No lower extremity tenderness.  No edema. Neurologic:   Normal speech and language.  CN 2-10 normal. Motor grossly intact. No gross focal neurologic deficits are appreciated.  Skin:    Skin is warm, dry and intact. No rash noted.  No petechiae, purpura, or bullae.  ____________________________________________    LABS (pertinent positives/negatives) (all labs ordered are listed, but only abnormal results are displayed) Labs Reviewed  CBC - Abnormal; Notable for the following:       Result Value   RDW 15.1 (*)    All other components within normal limits  URINALYSIS, COMPLETE (UACMP) WITH MICROSCOPIC - Abnormal; Notable for the following:    Color, Urine YELLOW (*)    APPearance CLEAR (*)    All other components within normal limits  HEPATIC FUNCTION PANEL - Abnormal; Notable for the following:    Total Protein 8.5 (*)    Bilirubin, Direct <0.1 (*)    All other components within normal limits  BASIC METABOLIC PANEL  LIPASE, BLOOD   ____________________________________________   EKG Interpreted by me Sinus tachycardia rate 107, normal axis intervals QRS ST segments and T waves  ____________________________________________    RADIOLOGY  No results found.  ____________________________________________   PROCEDURES Procedures  ____________________________________________   INITIAL IMPRESSION / ASSESSMENT AND PLAN / ED COURSE  Pertinent  labs & imaging results that were available during my care of the patient were reviewed by me and considered in my medical decision making (see chart for details).  Patient well appearing no acute distress, presents with vague migraine type headache, possibly precipitated as side effect from his heart bony medication. Reviewing the medication information, headaches and fatigue are very common with this medicine. Labs are unremarkable, vital signs unremarkable, exam unremarkable. Patient given IV fluids for hydration, migraine cocktail and feeling better. We'll discharge home to follow up with primary care. Return precautions given.         ____________________________________________   FINAL CLINICAL IMPRESSION(S) / ED DIAGNOSES  Final diagnoses:  Acute nonintractable headache, unspecified headache type      New Prescriptions   DIPHENHYDRAMINE (BENADRYL) 25 MG CAPSULE    Take 2 capsules (50 mg total) by mouth every 6 (six) hours as needed.  KETOROLAC (TORADOL) 10 MG TABLET    Take 1 tablet (10 mg total) by mouth every 6 (six) hours as needed for moderate pain.     Portions of this note were generated with dragon dictation software. Dictation errors may occur despite best attempts at proofreading.    Sharman Cheek, MD 03/12/17 1610    Sharman Cheek, MD 03/12/17 [phone]

## 2017-03-12 NOTE — Discharge Instructions (Signed)
Your blood tests today were all unremarkable.  Follow up with your doctor for continued monitoring of your symptoms.

## 2017-03-12 NOTE — ED Notes (Signed)
Pt found in room att  Pt reports on day 29 of Hepatitis C treatment (Harvoni),  Pt reports pressure in head, tightness in shoulders, and left side pain - worse in left groin

## 2017-03-12 NOTE — ED Triage Notes (Signed)
Patient brought in be ems from home. Patient states that he is having a reaction to his medication. Patient started taking harvoni 28 days ago and stopped taking trazadone and valium. Patient states that he is now feeling weak, headache and visual changes.

## 2017-03-13 ENCOUNTER — Emergency Department
Admission: EM | Admit: 2017-03-13 | Discharge: 2017-03-13 | Disposition: A | Discharge status: Home / Self Care | Payer: Self-pay | Attending: Emergency Medicine | Admitting: Emergency Medicine

## 2017-03-13 ENCOUNTER — Encounter: Payer: Self-pay | Admitting: Emergency Medicine

## 2017-03-13 DIAGNOSIS — R079 Chest pain, unspecified: Secondary | ICD-10-CM

## 2017-03-13 DIAGNOSIS — F17210 Nicotine dependence, cigarettes, uncomplicated: Secondary | ICD-10-CM | POA: Insufficient documentation

## 2017-03-13 DIAGNOSIS — M79605 Pain in left leg: Secondary | ICD-10-CM

## 2017-03-13 DIAGNOSIS — M79602 Pain in left arm: Secondary | ICD-10-CM

## 2017-03-13 DIAGNOSIS — Z79899 Other long term (current) drug therapy: Secondary | ICD-10-CM | POA: Insufficient documentation

## 2017-03-13 LAB — BASIC METABOLIC PANEL
ANION GAP: 10 (ref 5–15)
BUN: 13 mg/dL (ref 6–20)
CALCIUM: 9.5 mg/dL (ref 8.9–10.3)
CO2: 20 mmol/L — AB (ref 22–32)
CREATININE: 0.68 mg/dL (ref 0.61–1.24)
Chloride: 106 mmol/L (ref 101–111)
GFR calc Af Amer: >60 mL/min (ref >60)
GFR calc non Af Amer: >60 mL/min (ref >60)
GLUCOSE: 74 mg/dL (ref 65–99)
Potassium: 3.5 mmol/L (ref 3.5–5.1)
Sodium: 136 mmol/L (ref 135–145)

## 2017-03-13 LAB — CBC
HCT: 40.5 % (ref 40.0–52.0)
Hemoglobin: 14.3 g/dL (ref 13.0–18.0)
MCH: 32.3 pg (ref 26.0–34.0)
MCHC: 35.4 g/dL (ref 32.0–36.0)
MCV: 91.3 fL (ref 80.0–100.0)
Platelets: 301 10*3/uL (ref 150–440)
RBC: 4.44 MIL/uL (ref 4.40–5.90)
RDW: 15.1 % — ABNORMAL HIGH (ref 11.5–14.5)
WBC: 7.6 10*3/uL (ref 3.8–10.6)

## 2017-03-13 LAB — TROPONIN I

## 2017-03-13 MED ORDER — IBUPROFEN 400 MG PO TABS
400.0000 mg | ORAL_TABLET | Freq: Once | ORAL | Status: AC
  Administered 2017-03-13: 400 mg via ORAL
  Filled 2017-03-13: qty 1

## 2017-03-13 NOTE — Discharge Instructions (Signed)

## 2017-03-13 NOTE — ED Provider Notes (Signed)
[facility] Emergency Department Provider Note  ____________________________________________   First MD Initiated Contact with Patient 03/13/17 1525     (approximate)  I have reviewed the triage vital signs and the nursing notes.   HISTORY  Chief Complaint Chest Pain    [name] is a 31 y.o. male who is well-known to this emergency department and to this provider for chronic substance abuse including alcohol, tobacco, and benzodiazepines, as well as frequent visits for a variety of pain including chest pain and abdominal pain.  He presents EMS for evaluation today of acute onset chest pain that he states started about 45 minutes ago. He states that he was sitting at home watching TV when the pain developed in his upper left leg feels like a pinching it is radiating up into the left side of his chest and into his left arm.  He felt like his heartbeat was irregular as well so he thought he should get it checked out.  He states he can still feel some of the pinching pain in his leg but not in his chest.  He denies shortness of breath, fever/chills, headache, nausea, vomiting, abdominal pain, dysuria.  Nothing in particular makes it better nor worse.  He reports that he has been off of his prescribed Valium for about a month.  He thinks his anxiety has been a little bit worse since that time and wonders if this may be contributing to his symptoms today but is not sure.  He denies any recent alcohol use today.   Past Medical History:  Diagnosis Date  . Bipolar 1 disorder (HCC)   . Chronic headache   . 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 Horseshoe kidney     Patient Active Problem List   Diagnosis Date Noted  . Bipolar 1 disorder, mixed, severe (HCC) 04/22/2016  . Alcohol use disorder, mild, abuse 04/22/2016  . Suicidal ideation 04/22/2016  . Tobacco use disorder 04/22/2016  . 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 TONSILLECTOMY      Prior to Admission medications   Medication Sig Start Date End Date Taking? Authorizing Provider  diazepam (VALIUM) 5 MG tablet Take 2.5 mg by mouth every 12 (twelve) hours as needed for anxiety.     Historical Provider, MD  diphenhydrAMINE (BENADRYL) 25 mg capsule Take 2 capsules (50 mg total) by mouth every 6 (six) hours as needed. 03/12/17   Sharman CheekPhillip Stafford, MD  ketorolac (TORADOL) 10 MG tablet Take 1 tablet (10 mg total) by mouth every 6 (six) hours as needed for moderate pain. 03/12/17   Sharman CheekPhillip Stafford, MD  Ledipasvir-Sofosbuvir (HARVONI) 90-400 MG TABS Take by mouth 1 day or 1 dose.    Historical Provider, MD  traZODone (DESYREL) 50 MG tablet Take 25 mg by mouth at bedtime.    Historical Provider, MD    Allergies Geodon [ziprasidone hcl]  Family History  Problem Relation Age of Onset  . Hypertension Mother   . Diabetes Mother   . Hypertension Father   . Heart failure Father   . Diabetes Father     Social History Social History  Substance Use Topics  . Smoking status: Current Every Day Smoker    Packs/day: 1.00    Types: Cigarettes  . Smokeless tobacco: Former NeurosurgeonUser  . Alcohol use No    Review of Systems Constitutional: No fever/chills Eyes: No visual changes. ENT:  No sore throat. Cardiovascular: Chest pain that seems to be radiating from his left lower leg into the left side of his body including the left chest and into his left arm Respiratory: Denies shortness of breath. Gastrointestinal: No abdominal pain.  No nausea, no vomiting.  No diarrhea.  No constipation. Genitourinary: Negative for dysuria. Musculoskeletal: Left upper leg pain dating into the left side of his body and left arm Skin: Negative for rash. Neurological: Negative for headaches, focal weakness or numbness.  10-point ROS otherwise negative.  ____________________________________________   PHYSICAL EXAM:  VITAL  SIGNS: ED Triage Vitals  Enc Vitals Group     BP 03/13/17 1505 121/84     Pulse Rate 03/13/17 1505 90     Resp 03/13/17 1505 20     Temp 03/13/17 1505 98.2 F (36.8 C)     Temp Source 03/13/17 1505 Oral     SpO2 03/13/17 1505 99 %     Weight 03/13/17 1505 125 lb (56.7 kg)     Height 03/13/17 1505 5\' 9"  (1.753 m)     Head Circumference --      Peak Flow --      Pain Score 03/13/17 1506 8     Pain Loc --      Pain Edu? --      Excl. in GC? --     Constitutional: Alert and oriented. Well appearing and in no acute distress. Eyes: Conjunctivae are normal. PERRL. EOMI. Head: Atraumatic. Nose: No congestion/rhinnorhea. Mouth/Throat: Mucous membranes are moist. Neck: No stridor.  No meningeal signs.   Cardiovascular: Normal rate, regular rhythm. Good peripheral circulation. Grossly normal heart sounds. Respiratory: Normal respiratory effort.  No retractions. Lungs CTAB. Gastrointestinal: Soft and nontender. No distention.  Musculoskeletal: No lower extremity tenderness nor edema. No gross deformities of extremities. Neurologic:  Normal speech and language. No gross focal neurologic deficits are appreciated.  Skin:  Skin is warm, dry and intact. No rash noted. Psychiatric: Mood and affect are normal. Speech and behavior are normal.  ____________________________________________   LABS (all labs ordered are listed, but only abnormal results are displayed)  Labs Reviewed  BASIC METABOLIC PANEL - Abnormal; Notable for the following:       Result Value   CO2 20 (*)    All other components within normal limits  CBC - Abnormal; Notable for the following:    RDW 15.1 (*)    All other components within normal limits  TROPONIN I   ____________________________________________  EKG  ED ECG REPORT I, Aleira Deiter, the attending physician, personally viewed and interpreted this ECG.  Date: 03/13/2017 EKG Time: 15:01 Rate: 88 Rhythm: normal sinus rhythm QRS Axis:  normal Intervals: normal ST/T Wave abnormalities: normal Conduction Disturbances: none Narrative Interpretation: unremarkable ____________________________________________  RADIOLOGY   No results found.  ____________________________________________   PROCEDURES  Critical Care performed: No   Procedure(s) performed:   Procedures   ____________________________________________   INITIAL IMPRESSION / ASSESSMENT AND PLAN / ED COURSE  Pertinent labs & imaging results that were available during my care of the patient were reviewed by me and considered in my medical decision making (see chart for details).  The patient's EKG is reassuring and actually looks more "normal" than it did yesterday when he was in the emergency department for possible medication side effects (he started the medication for hepatitis several weeks ago).  He is at his baseline, friendly and interactive, and I believe anxiety may be treating to his symptoms.  They are  likely musculoskeletal and I disconnected him from all of his monitoring equipment and watched him ambulate to the bathroom after my assessment.  There is no evidence of any acute or emergent medical condition at this time.  Although his symptoms started relatively recently, his HEART score is very low risk (2 at the most) and he is appropriate for immediate discharge assuming his troponin is negative.  He understands and agrees with this plan.  I will give him some ibuprofen and something to eat while he awaits his lab results.  Given his history of benzodiazepine abuse I will avoid any of those medications.   Clinical Course as of Mar 14 1639  Wynelle Link Mar 13, 2017  1637 The patient states he feels much better and feels that anxiety and panic attacks are likely contributing now that he is no longer taking Valium.  He asked me for techniques that may help him until he can follow-up with Evlyn Clines; he has an appointment scheduled this coming week.  I gave  him some recommendations and provided some information about panic attacks and anxiety on his written discharge instructions as well.  He does not want to stay any longer as I described above I think it is not necessary to repeat a troponin.  I gave my usual and customary return precautions.     [CF]    Clinical Course User Index [CF] Loleta Rose, MD    ____________________________________________  FINAL CLINICAL IMPRESSION(S) / ED DIAGNOSES  Final diagnoses:  Chest pain, unspecified type  Left leg pain  Left arm pain     MEDICATIONS GIVEN DURING THIS VISIT:  Medications  ibuprofen (ADVIL,MOTRIN) tablet 400 mg (400 mg Oral Given 03/13/17 1620)     NEW OUTPATIENT MEDICATIONS STARTED DURING THIS VISIT:  New Prescriptions   No medications on file    Modified Medications   No medications on file    Discontinued Medications   No medications on file     Note:  This document was prepared using Dragon voice recognition software and may include unintentional dictation errors.    Loleta Rose, MD 03/13/17 1640

## 2017-03-13 NOTE — ED Triage Notes (Signed)
Pt presents to ED from home c/o chest pain 30-45 minutes ago described as pinching pain with irregular heart beat that radiates from left arm to left leg.

## 2017-04-03 ENCOUNTER — Encounter: Payer: Self-pay | Admitting: Emergency Medicine

## 2017-04-03 ENCOUNTER — Emergency Department
Admission: EM | Admit: 2017-04-03 | Discharge: 2017-04-03 | Disposition: A | Discharge status: Home / Self Care | Payer: Self-pay | Attending: Student in an Organized Health Care Education/Training Program | Admitting: Student in an Organized Health Care Education/Training Program

## 2017-04-03 ENCOUNTER — Emergency Department: Payer: Self-pay

## 2017-04-03 DIAGNOSIS — F17210 Nicotine dependence, cigarettes, uncomplicated: Secondary | ICD-10-CM | POA: Insufficient documentation

## 2017-04-03 DIAGNOSIS — R079 Chest pain, unspecified: Secondary | ICD-10-CM

## 2017-04-03 DIAGNOSIS — F1910 Other psychoactive substance abuse, uncomplicated: Secondary | ICD-10-CM

## 2017-04-03 DIAGNOSIS — R0602 Shortness of breath: Secondary | ICD-10-CM | POA: Insufficient documentation

## 2017-04-03 LAB — TROPONIN I

## 2017-04-03 LAB — BASIC METABOLIC PANEL
Anion gap: 11 (ref 5–15)
BUN: 17 mg/dL (ref 6–20)
CALCIUM: 9.6 mg/dL (ref 8.9–10.3)
CHLORIDE: 101 mmol/L (ref 101–111)
CO2: 22 mmol/L (ref 22–32)
CREATININE: 0.91 mg/dL (ref 0.61–1.24)
Glucose, Bld: 127 mg/dL — ABNORMAL HIGH (ref 65–99)
Potassium: 3.3 mmol/L — ABNORMAL LOW (ref 3.5–5.1)
SODIUM: 134 mmol/L — AB (ref 135–145)

## 2017-04-03 LAB — CBC WITH DIFFERENTIAL/PLATELET
BASOS PCT: 0 %
Basophils Absolute: 0 10*3/uL (ref 0–0.1)
EOS PCT: 0 %
Eosinophils Absolute: 0 10*3/uL (ref 0–0.7)
HCT: 43.1 % (ref 40.0–52.0)
HEMOGLOBIN: 15 g/dL (ref 13.0–18.0)
Lymphocytes Relative: 14 %
Lymphs Abs: 1.5 10*3/uL (ref 1.0–3.6)
MCH: 32 pg (ref 26.0–34.0)
MCHC: 34.9 g/dL (ref 32.0–36.0)
MCV: 91.6 fL (ref 80.0–100.0)
MONOS PCT: 8 %
Monocytes Absolute: 0.8 10*3/uL (ref 0.2–1.0)
NEUTROS PCT: 78 %
Neutro Abs: 8.2 10*3/uL — ABNORMAL HIGH (ref 1.4–6.5)
PLATELETS: 315 10*3/uL (ref 150–440)
RBC: 4.71 MIL/uL (ref 4.40–5.90)
RDW: 15.2 % — AB (ref 11.5–14.5)
WBC: 10.6 10*3/uL (ref 3.8–10.6)

## 2017-04-03 MED ORDER — SODIUM CHLORIDE 0.9 % IV BOLUS (SEPSIS)
1000.0000 mL | Freq: Once | INTRAVENOUS | Status: AC
  Administered 2017-04-03: 1000 mL via INTRAVENOUS

## 2017-04-03 MED ORDER — LORAZEPAM 2 MG/ML IJ SOLN
1.0000 mg | Freq: Once | INTRAMUSCULAR | Status: AC
  Administered 2017-04-03: 1 mg via INTRAVENOUS
  Filled 2017-04-03: qty 1

## 2017-04-03 NOTE — ED Provider Notes (Signed)
[facility] Emergency Department Provider Note    None    (approximate)  I have reviewed the triage vital signs and the nursing notes.   HISTORY  Chief Complaint Shortness of Breath    HPI Nathan [name] is a 31 y.o. male long history of bipolar disorder as well as chronic headache hepatitis C and polysubstance abuse with multiple ER visits over the past several years presents to the ER today with a chief complaint of chest pain and shortness of breath that occurred immediately after smoking crack cocaine roughly 45 minutes prior to arrival. Patient arrives to the ER tearful and stating that he made a mistake. Denies any SI or HI. States he was just hanging out with friends and they had the crack cocaine. He felt an impulse to use so he did. States he walked home and started feeling more high and then started feeling chest pain and shortness of breath. Did feel nauseated. Patient called EMS. Denies any numbness or tingling. States he does have a headache that he rates a 7 out of 10 in severity which is similar to previous headaches. States he does feel very anxious.   Past Medical History:  Diagnosis Date  . Bipolar 1 disorder (HCC)   . Chronic headache   . 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 Horseshoe kidney    Family History  Problem Relation Age of Onset  . Hypertension Mother   . Diabetes Mother   . Hypertension Father   . Heart failure Father   . Diabetes Father    Past Surgical History:  Procedure Laterality Date  . TONSILLECTOMY     Patient Active Problem List   Diagnosis Date Noted  . Bipolar 1 disorder, mixed, severe (HCC) 04/22/2016  . Alcohol use disorder, mild, abuse 04/22/2016  . Suicidal ideation 04/22/2016  . Tobacco use disorder 04/22/2016  . 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 Authorizing Provider  diazepam  (VALIUM) 5 MG tablet Take 2.5 mg by mouth every 12 (twelve) hours as needed for anxiety.     Historical Provider, MD  diphenhydrAMINE (BENADRYL) 25 mg capsule Take 2 capsules (50 mg total) by mouth every 6 (six) hours as needed. 03/12/17   Sharman Cheek, MD  ketorolac (TORADOL) 10 MG tablet Take 1 tablet (10 mg total) by mouth every 6 (six) hours as needed for moderate pain. 03/12/17   Sharman Cheek, MD  Ledipasvir-Sofosbuvir (HARVONI) 90-400 MG TABS Take by mouth 1 day or 1 dose.    Historical Provider, MD  traZODone (DESYREL) 50 MG tablet Take 25 mg by mouth at bedtime.    Historical Provider, MD    Allergies Geodon [ziprasidone hcl]    Social History Social History  Substance Use Topics  . Smoking status: Current Every Day Smoker    Packs/day: 1.50    Types: Cigarettes  . Smokeless tobacco: Former Neurosurgeon  . Alcohol use No    Review of Systems Patient denies headaches, rhinorrhea, blurry vision, numbness, shortness of breath, chest pain, edema, cough, abdominal pain, nausea, vomiting, diarrhea, dysuria, fevers, rashes or hallucinations unless otherwise stated above in HPI. ____________________________________________   PHYSICAL EXAM:  VITAL SIGNS: Vitals:   04/03/17 1938 04/03/17 2000  BP: 131/85 135/87  Pulse: 98 92  Resp: 12 18  Temp: 98.5 F (36.9 C)     Constitutional: Alert and oriented. Tearful  and anxious appearing but in no acute distress. Eyes: Conjunctivae are normal. PERRL. EOMI. Head: Atraumatic. Nose: No congestion/rhinnorhea. Mouth/Throat: Mucous membranes are moist.  Oropharynx non-erythematous. Neck: No stridor. Painless ROM. No cervical spine tenderness to palpation Hematological/Lymphatic/Immunilogical: No cervical lymphadenopathy. Cardiovascular: Normal rate, regular rhythm. Grossly normal heart sounds.  Good peripheral circulation. Respiratory: Normal respiratory effort.  No retractions. Lungs CTAB. Gastrointestinal: Soft and nontender. No  distention. No abdominal bruits. No CVA tenderness. Musculoskeletal: No lower extremity tenderness nor edema.  No joint effusions. Neurologic:  Normal speech and language. No gross focal neurologic deficits are appreciated. No gait instability. Skin:  Skin is warm, dry and intact. No rash noted. Psychiatric: Mood and affect are normal. Speech and behavior are normal.  ____________________________________________   LABS (all labs ordered are listed, but only abnormal results are displayed)  Results for orders placed or performed during the hospital encounter of 04/03/17 (from the past 24 hour(s))  CBC with Differential/Platelet     Status: Abnormal   Collection Time: 04/03/17  7:39 PM  Result Value Ref Range   WBC 10.6 3.8 - 10.6 K/uL   RBC 4.71 4.40 - 5.90 MIL/uL   Hemoglobin 15.0 13.0 - 18.0 g/dL   HCT 16.1 09.6 - 04.5 %   MCV 91.6 80.0 - 100.0 fL   MCH 32.0 26.0 - 34.0 pg   MCHC 34.9 32.0 - 36.0 g/dL   RDW 40.9 (H) 81.1 - 91.4 %   Platelets 315 150 - 440 K/uL   Neutrophils Relative % 78 %   Neutro Abs 8.2 (H) 1.4 - 6.5 K/uL   Lymphocytes Relative 14 %   Lymphs Abs 1.5 1.0 - 3.6 K/uL   Monocytes Relative 8 %   Monocytes Absolute 0.8 0.2 - 1.0 K/uL   Eosinophils Relative 0 %   Eosinophils Absolute 0.0 0 - 0.7 K/uL   Basophils Relative 0 %   Basophils Absolute 0.0 0 - 0.1 K/uL  Basic metabolic panel     Status: Abnormal   Collection Time: 04/03/17  7:39 PM  Result Value Ref Range   Sodium 134 (L) 135 - 145 mmol/L   Potassium 3.3 (L) 3.5 - 5.1 mmol/L   Chloride 101 101 - 111 mmol/L   CO2 22 22 - 32 mmol/L   Glucose, Bld 127 (H) 65 - 99 mg/dL   BUN 17 6 - 20 mg/dL   Creatinine, Ser 7.82 0.61 - 1.24 mg/dL   Calcium 9.6 8.9 - 95.6 mg/dL   GFR calc non Af Amer >60 >60 mL/min   GFR calc Af Amer >60 >60 mL/min   Anion gap 11 5 - 15  Troponin I     Status: None   Collection Time: 04/03/17  7:44 PM  Result Value Ref Range   Troponin I <0.03 <0.03 ng/mL    ____________________________________________  EKG My review and personal interpretation at Time: 19:45   Indication: chest pain  Rate: 95  Rhythm: sinus Axis: normal  Other: BER, concave upwards st segments, normal intervals, no reciprocal depressions ____________________________________________  RADIOLOGY  I personally reviewed all radiographic images ordered to evaluate for the above acute complaints and reviewed radiology reports and findings.  These findings were personally discussed with the patient.  Please see medical record for radiology report.  ____________________________________________   PROCEDURES  Procedure(s) performed:  Procedures    Critical Care performed: no ____________________________________________   INITIAL IMPRESSION / ASSESSMENT AND PLAN / ED COURSE  Pertinent labs & imaging results that were available during  my care of the patient were reviewed by me and considered in my medical decision making (see chart for details).  DDX: anxiety, panic attack, substance induced chest pain, pna, ptx, chf  Nathan [name] is a 31 y.o. who presents to the ED with chest pain as described above after using crack cocaine prior to arrival. He is hemodynamically stable in no acute distress. EKG shows no acute ST elevations. I do suspect chest pain secondary to substance abuse. Chest x-ray ordered to evaluate for evidence of pneumonia or pneumothorax shows none.  We'll provide patient with Ativan for symptomatically relief. We'll keep patient on monitor and reassess.  The patient will be placed on continuous pulse oximetry and telemetry for monitoring.  Laboratory evaluation will be sent to evaluate for the above complaints.     Clinical Course as of Apr 04 2111  Wynelle Link Apr 03, 2017  2109 Patient reassessed. No chest pain-free. Patient acute distress. Blood work is reassuring. Patient stable for follow-up with PCP.  [PR]    Clinical Course User Index [PR] Willy Eddy, MD     ____________________________________________   FINAL CLINICAL IMPRESSION(S) / ED DIAGNOSES  Final diagnoses:  Chest pain, unspecified type  Substance abuse      NEW MEDICATIONS STARTED DURING THIS VISIT:  New Prescriptions   No medications on file     Note:  This document was prepared using Dragon voice recognition software and may include unintentional dictation errors.    Willy Eddy, MD 04/03/17 2112

## 2017-04-03 NOTE — ED Triage Notes (Signed)
Pt presents to ED via [facility] c/o "feeling funny" after smoking approx. 1G of crack cocaine. Pt reports he was sober for about a year before today. States he thinks his tolerance has lessened and he took too much. EMS report initial tachycardia in the 140s and nausea, given  Zofran PTA. Pt reports feeling anxious and short of breath. Denies chest pain. C/o headache 7/10. All VS WNL. CBG 107 per EMS.

## 2017-04-03 NOTE — ED Notes (Signed)

## 2017-04-06 ENCOUNTER — Emergency Department
Admission: EM | Admit: 2017-04-06 | Discharge: 2017-04-06 | Disposition: A | Discharge status: Home / Self Care | Payer: Self-pay | Attending: Emergency Medicine | Admitting: Emergency Medicine

## 2017-04-06 ENCOUNTER — Encounter: Payer: Self-pay | Admitting: *Deleted

## 2017-04-06 DIAGNOSIS — F17210 Nicotine dependence, cigarettes, uncomplicated: Secondary | ICD-10-CM | POA: Insufficient documentation

## 2017-04-06 DIAGNOSIS — F419 Anxiety disorder, unspecified: Secondary | ICD-10-CM | POA: Insufficient documentation

## 2017-04-06 MED ORDER — DIAZEPAM 5 MG PO TABS
2.5000 mg | ORAL_TABLET | Freq: Once | ORAL | Status: AC
  Administered 2017-04-06: 2.5 mg via ORAL
  Filled 2017-04-06: qty 1

## 2017-04-06 NOTE — ED Triage Notes (Signed)
Pt is tearful says that he is sad, cannot sleep, feeling anxious, and drank last night for the first time in a while. Pt has not taken his medication in about 4 days (Valium). Pt denies SI or HI. Hx bipolar, anxiety, depression. He says he is just hoping that he can have help with his medicine.

## 2017-04-06 NOTE — Discharge Instructions (Signed)
Keep your appointment at [facility] this morning. Return to the ER for worsening symptoms, feelings of hurting yourself or others, or other concerns.

## 2017-04-06 NOTE — ED Provider Notes (Signed)
[facility] Emergency Department Provider Note   ____________________________________________   First MD Initiated Contact with Patient 04/06/17 [phone]     (approximate)  I have reviewed the triage vital signs and the nursing notes.   HISTORY  Chief Complaint Anxiety    HPI Nathan [name] is a 31 y.o. male who presents to the ED from home with a chief complaint of anxiety. Patient reports he is in transition from [facility] to [place]. Has an appointment at [facility] this morning at 9:30am. Ran out of his Valium 4 days ago. Girlfriend told him last evening that he was not allowed to see their son. Patient tearful and upset, drink a beer last night. Requesting one dose of oral Ativan to calm his nerves so he can make it to his appointment this morning. Denies active SI/HI/AH/VH. Voices no medical complaints.   Past Medical History:  Diagnosis Date  . Bipolar 1 disorder (HCC)   . Chronic headache   . 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 Horseshoe kidney     Patient Active Problem List   Diagnosis Date Noted  . Bipolar 1 disorder, mixed, severe (HCC) 04/22/2016  . Alcohol use disorder, mild, abuse 04/22/2016  . Suicidal ideation 04/22/2016  . Tobacco use disorder 04/22/2016  . 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 TONSILLECTOMY      Prior to Admission medications   Medication Sig Start Date End Date Taking? Authorizing Provider  diazepam (VALIUM) 5 MG tablet Take 2.5 mg by mouth every 12 (twelve) hours as needed for anxiety.     Historical Provider, MD  diphenhydrAMINE (BENADRYL) 25 mg capsule Take 2 capsules (50 mg total) by mouth every 6 (six) hours as needed. 03/12/17   Sharman Cheek, MD  ketorolac (TORADOL) 10 MG tablet Take 1 tablet (10 mg total) by mouth every 6 (six) hours as needed for moderate pain. 03/12/17   Sharman Cheek, MD  Ledipasvir-Sofosbuvir  (HARVONI) 90-400 MG TABS Take by mouth 1 day or 1 dose.    Historical Provider, MD  traZODone (DESYREL) 50 MG tablet Take 25 mg by mouth at bedtime.    Historical Provider, MD    Allergies Geodon [ziprasidone hcl]  Family History  Problem Relation Age of Onset  . Hypertension Mother   . Diabetes Mother   . Hypertension Father   . Heart failure Father   . Diabetes Father     Social History Social History  Substance Use Topics  . Smoking status: Current Every Day Smoker    Packs/day: 1.50    Types: Cigarettes  . Smokeless tobacco: Former Neurosurgeon  . Alcohol use No    Review of Systems  Constitutional: No fever/chills. Eyes: No visual changes. ENT: No sore throat. Cardiovascular: Denies chest pain. Respiratory: Denies shortness of breath. Gastrointestinal: No abdominal pain.  No nausea, no vomiting.  No diarrhea.  No constipation. Genitourinary: Negative for dysuria. Musculoskeletal: Negative for back pain. Skin: Negative for rash. Neurological: Negative for headaches, focal weakness or numbness. Psychiatric: Positive for stress and anxiety. Negative for SI/HI/AH/VH.  10-point ROS otherwise negative.  ____________________________________________   PHYSICAL EXAM:  VITAL SIGNS: ED Triage Vitals  Enc Vitals Group     BP 04/06/17 0557 136/76     Pulse Rate 04/06/17 0557 96     Resp 04/06/17 0557 18     Temp 04/06/17 0557 97.8 F (36.6 C)  Temp src --      SpO2 04/06/17 0557 97 %     Weight 04/06/17 0522 125 lb (56.7 kg)     Height 04/06/17 0522  (1.753 m)     Head Circumference --      Peak Flow --      Pain Score 04/06/17 0521 9     Pain Loc --      Pain Edu? --      Excl. in GC? --     Constitutional: Alert and oriented. Well appearing and in no acute distress. Tearful. Eyes: Conjunctivae are normal. PERRL. EOMI. Head: Atraumatic. Nose: No congestion/rhinnorhea. Mouth/Throat: Mucous membranes are moist.  Oropharynx non-erythematous. Neck: No  stridor.   Cardiovascular: Normal rate, regular rhythm. Grossly normal heart sounds.  Good peripheral circulation. Respiratory: Normal respiratory effort.  No retractions. Lungs CTAB. Gastrointestinal: Soft and nontender. No distention. No abdominal bruits. No CVA tenderness. Musculoskeletal: No lower extremity tenderness nor edema.  No joint effusions. Neurologic:  Normal speech and language. No gross focal neurologic deficits are appreciated. No gait instability. Skin:  Skin is warm, dry and intact. No rash noted. Psychiatric: Mood and affect are tearful. Speech and behavior are normal.  ____________________________________________   LABS (all labs ordered are listed, but only abnormal results are displayed)  Labs Reviewed - No data to display ____________________________________________  EKG  None ____________________________________________  RADIOLOGY  None ____________________________________________   PROCEDURES  Procedure(s) performed: None  Procedures  Critical Care performed: No  ____________________________________________   INITIAL IMPRESSION / ASSESSMENT AND PLAN / ED COURSE  Pertinent labs & imaging results that were available during my care of the patient were reviewed by me and considered in my medical decision making (see chart for details).  31 year old male who presents with anxiety and tearfulness secondary to recent stressors regarding his child. He is tearful but without active SI/HI/AH/VH. Will give 1 dose of oral Valium and patient will go to [facility] this morning as scheduled for mental health counseling. Strict return precautions given. Patient verbalizes understanding and agrees with plan of care.      ____________________________________________   FINAL CLINICAL IMPRESSION(S) / ED DIAGNOSES  Final diagnoses:  Anxiety      NEW MEDICATIONS STARTED DURING THIS VISIT:  New Prescriptions   No medications on file     Note:  This  document was prepared using Dragon voice recognition software and may include unintentional dictation errors.    Irean Hong, MD 04/06/17 [phone]

## 2017-04-23 ENCOUNTER — Encounter: Payer: Self-pay | Admitting: Emergency Medicine

## 2017-04-23 ENCOUNTER — Emergency Department: Payer: Self-pay

## 2017-04-23 ENCOUNTER — Emergency Department
Admission: EM | Admit: 2017-04-23 | Discharge: 2017-04-23 | Discharge status: Against Medical Advice | Payer: Self-pay | Attending: Emergency Medicine | Admitting: Emergency Medicine

## 2017-04-23 DIAGNOSIS — R0602 Shortness of breath: Secondary | ICD-10-CM | POA: Insufficient documentation

## 2017-04-23 DIAGNOSIS — R079 Chest pain, unspecified: Secondary | ICD-10-CM | POA: Insufficient documentation

## 2017-04-23 DIAGNOSIS — F17220 Nicotine dependence, chewing tobacco, uncomplicated: Secondary | ICD-10-CM | POA: Insufficient documentation

## 2017-04-23 DIAGNOSIS — F17210 Nicotine dependence, cigarettes, uncomplicated: Secondary | ICD-10-CM | POA: Insufficient documentation

## 2017-04-23 LAB — BASIC METABOLIC PANEL
Anion gap: 12 (ref 5–15)
BUN: 12 mg/dL (ref 6–20)
CALCIUM: 9.7 mg/dL (ref 8.9–10.3)
CHLORIDE: 102 mmol/L (ref 101–111)
CO2: 24 mmol/L (ref 22–32)
CREATININE: 0.74 mg/dL (ref 0.61–1.24)
Glucose, Bld: 96 mg/dL (ref 65–99)
Potassium: 3.4 mmol/L — ABNORMAL LOW (ref 3.5–5.1)
SODIUM: 138 mmol/L (ref 135–145)

## 2017-04-23 LAB — CBC
HCT: 43.9 % (ref 40.0–52.0)
Hemoglobin: 14.7 g/dL (ref 13.0–18.0)
MCH: 30.7 pg (ref 26.0–34.0)
MCHC: 33.5 g/dL (ref 32.0–36.0)
MCV: 91.7 fL (ref 80.0–100.0)
PLATELETS: 321 10*3/uL (ref 150–440)
RBC: 4.79 MIL/uL (ref 4.40–5.90)
RDW: 14.5 % (ref 11.5–14.5)
WBC: 14.2 10*3/uL — AB (ref 3.8–10.6)

## 2017-04-23 LAB — TROPONIN I

## 2017-04-23 MED ORDER — GI COCKTAIL ~~LOC~~
30.0000 mL | Freq: Once | ORAL | Status: AC
  Administered 2017-04-23: 30 mL via ORAL
  Filled 2017-04-23: qty 30

## 2017-04-23 MED ORDER — SODIUM CHLORIDE 0.9 % IV BOLUS (SEPSIS): 1000.0000 mL | Freq: Once | INTRAVENOUS | Status: DC

## 2017-04-23 NOTE — Discharge Instructions (Signed)
Please follow-up for further evaluation of this chest pain.

## 2017-04-23 NOTE — ED Provider Notes (Signed)
[facility] Emergency Department Provider Note   ____________________________________________   First MD Initiated Contact with Patient 04/23/17 0518     (approximate)  I have reviewed the triage vital signs and the nursing notes.   HISTORY  Chief Complaint Chest Pain    HPI Nathan [name] is a 31 y.o. male who comes into the hospital today with some chest pain. The patient reports he was sleeping aids or having pain in the middle of his chest with radiation to the left arm. He reports that it was hurting so bad and would go away C contacted the paramedics. The patient was given aspirin and nitroglycerin by EMS but it did give him a headache. He rates his pain a 7 out of 10 in intensity at this time. The patient has had similar pain in the past but reports that he doesn't remember the last time it occurred. The patient reports that the pain is heavy and sharp but is not worse with deep breathing. The patient endorses some nausea and shortness of breath but has not had any vomiting. He does have a history of anxiety and reports that he was drinking last night. He's had some dizziness and lightheadedness. The patient was concerned about this pain so he is here today for evaluation.   Past Medical History:  Diagnosis Date  . Bipolar 1 disorder (HCC)   . Chronic headache   . 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 Horseshoe kidney     Patient Active Problem List   Diagnosis Date Noted  . Bipolar 1 disorder, mixed, severe (HCC) 04/22/2016  . Alcohol use disorder, mild, abuse 04/22/2016  . Suicidal ideation 04/22/2016  . Tobacco use disorder 04/22/2016  . 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 TONSILLECTOMY      Prior to Admission medications   Medication Sig Start Date End Date Taking? Authorizing Provider  diazepam (VALIUM) 5 MG tablet Take 2.5 mg by mouth every 12  (twelve) hours as needed for anxiety.     Historical Provider, MD  diphenhydrAMINE (BENADRYL) 25 mg capsule Take 2 capsules (50 mg total) by mouth every 6 (six) hours as needed. 03/12/17   Sharman Cheek, MD  ketorolac (TORADOL) 10 MG tablet Take 1 tablet (10 mg total) by mouth every 6 (six) hours as needed for moderate pain. 03/12/17   Sharman Cheek, MD  Ledipasvir-Sofosbuvir (HARVONI) 90-400 MG TABS Take by mouth 1 day or 1 dose.    Historical Provider, MD  traZODone (DESYREL) 50 MG tablet Take 25 mg by mouth at bedtime.    Historical Provider, MD    Allergies Geodon [ziprasidone hcl]  Family History  Problem Relation Age of Onset  . Hypertension Mother   . Diabetes Mother   . Hypertension Father   . Heart failure Father   . Diabetes Father     Social History Social History  Substance Use Topics  . Smoking status: Current Every Day Smoker    Packs/day: 1.50    Types: Cigarettes  . Smokeless tobacco: Current User    Types: Chew  . Alcohol use Yes    Review of Systems  Constitutional: No fever/chills Eyes: No visual changes. ENT: No sore throat. Cardiovascular:  chest pain. Respiratory:  shortness of breath. Gastrointestinal: No abdominal pain.  No nausea, no vomiting.  No diarrhea.  No constipation. Genitourinary: Negative for dysuria. Musculoskeletal: Negative for back pain. Skin:  Negative for rash. Neurological: dizziness and lightheadedness.   ____________________________________________   PHYSICAL EXAM:  VITAL SIGNS: ED Triage Vitals  Enc Vitals Group     BP 04/23/17 0515 124/81     Pulse Rate 04/23/17 0510 92     Resp 04/23/17 0510 18     Temp 04/23/17 0515 97.8 F (36.6 C)     Temp Source 04/23/17 0515 Oral     SpO2 04/23/17 0510 98 %     Weight 04/23/17 0511 125 lb (56.7 kg)     Height 04/23/17 0511 5\' 9"  (1.753 m)     Head Circumference --      Peak Flow --      Pain Score 04/23/17 0510 8     Pain Loc --      Pain Edu? --      Excl. in GC?  --     Constitutional: Alert and oriented. Well appearing and in mild distress. Eyes: Conjunctivae are normal. PERRL. EOMI. Head: Atraumatic. Nose: No congestion/rhinnorhea. Mouth/Throat: Mucous membranes are moist.  Oropharynx non-erythematous. Cardiovascular: Normal rate, regular rhythm. Grossly normal heart sounds.  Good peripheral circulation. Respiratory: Normal respiratory effort.  No retractions. Lungs CTAB. Left chest tender to palpation Gastrointestinal: Soft and nontender. No distention. Positive bowel sounds Musculoskeletal: No lower extremity tenderness nor edema.  Neurologic:  Normal speech and language.  Skin:  Skin is warm, dry and intact.  Psychiatric: Mood and affect are normal.   ____________________________________________   LABS (all labs ordered are listed, but only abnormal results are displayed)  Labs Reviewed  BASIC METABOLIC PANEL - Abnormal; Notable for the following:       Result Value   Potassium 3.4 (*)    All other components within normal limits  CBC - Abnormal; Notable for the following:    WBC 14.2 (*)    All other components within normal limits  TROPONIN I  TROPONIN I   ____________________________________________  EKG  ED ECG REPORT I, Rebecka Apley, the attending physician, personally viewed and interpreted this ECG.   Date: 04/23/2017  EKG Time: 0511  Rate: 83  Rhythm: normal sinus rhythm  Axis: normal  Intervals:none  ST&T Change: none  ____________________________________________  RADIOLOGY  CXR ____________________________________________   PROCEDURES  Procedure(s) performed: None  Procedures  Critical Care performed: No  ____________________________________________   INITIAL IMPRESSION / ASSESSMENT AND PLAN / ED COURSE  Pertinent labs & imaging results that were available during my care of the patient were reviewed by me and considered in my medical decision making (see chart for details).  This is  a 31 year old male who comes into the hospital today with some chest pain. The patient reports that he is unsure when was the last time he had this but has had pain like this in the past. He also does have some anxiety. The patient's chest x-ray is unremarkable and the patient's blood work is also unremarkable. He received some nitroglycerin by EMS but I did give him a GI cocktail. The patient has been resting with no complaints. I will repeat the patient's pontine and then reassess the patient.  Clinical Course as of Apr 23 745  Sat Apr 23, 2017  1610 No active cardiopulmonary disease. DG Chest 2 View [AW]    Clinical Course User Index [AW] Rebecka Apley, MD   The patient's initial blood work and x-rays are unremarkable. He reports though that he needs to go home now that he can go to work. We  informed the patient his workup was not done. He states though that his troponin is typically elevated so because it is normal at this time he feels comfortable with just one study. We informed the patient that if he was going to leave prior to the completion of his evaluation he would need to leave AGAINST MEDICAL ADVICE. The patient reports that he feels calm without. He'll be discharged AGAINST MEDICAL ADVICE and should follow-up with the acute care clinic for further evaluation.  ____________________________________________   FINAL CLINICAL IMPRESSION(S) / ED DIAGNOSES  Final diagnoses:  Chest pain, unspecified type      NEW MEDICATIONS STARTED DURING THIS VISIT:  New Prescriptions   No medications on file     Note:  This document was prepared using Dragon voice recognition software and may include unintentional dictation errors.    Rebecka ApleyAllison P Adalie Mand, MD 04/23/17 [phone]

## 2017-04-23 NOTE — ED Notes (Signed)
Report to donald, rn.  

## 2017-04-23 NOTE — ED Notes (Signed)
Went into room to discuss plan with pt. He reports the only time he can get a ride to work is if he leaves now. Refuses fluids and second troponin. This RN explained that he would be leaving with an unfinished work up. MD notified. Pt verbalizes understanding and says he wants to sign out AMA.

## 2017-04-23 NOTE — ED Triage Notes (Signed)
Pt states mid to left sided chest pain with weakness and radiation to left arm that began approx 40 min pta. Pt did received 324mg  asa with ems and one spray of NTG with no change in pain. Pt states history of "high troponins".

## 2017-05-31 IMAGING — CR DG CHEST 2V
1 series · 2 of 2 positions shown · non-contrast
Comparison: 04/03/2017

CLINICAL DATA: Mid to left-sided chest pain with weakness. Onset 40
minutes prior to arrival.

EXAM:
CHEST  2 VIEW

[Series 1: dg chest 2 view · 0.14mm/px · 2 of 2 slices shown]
[im 1/2]
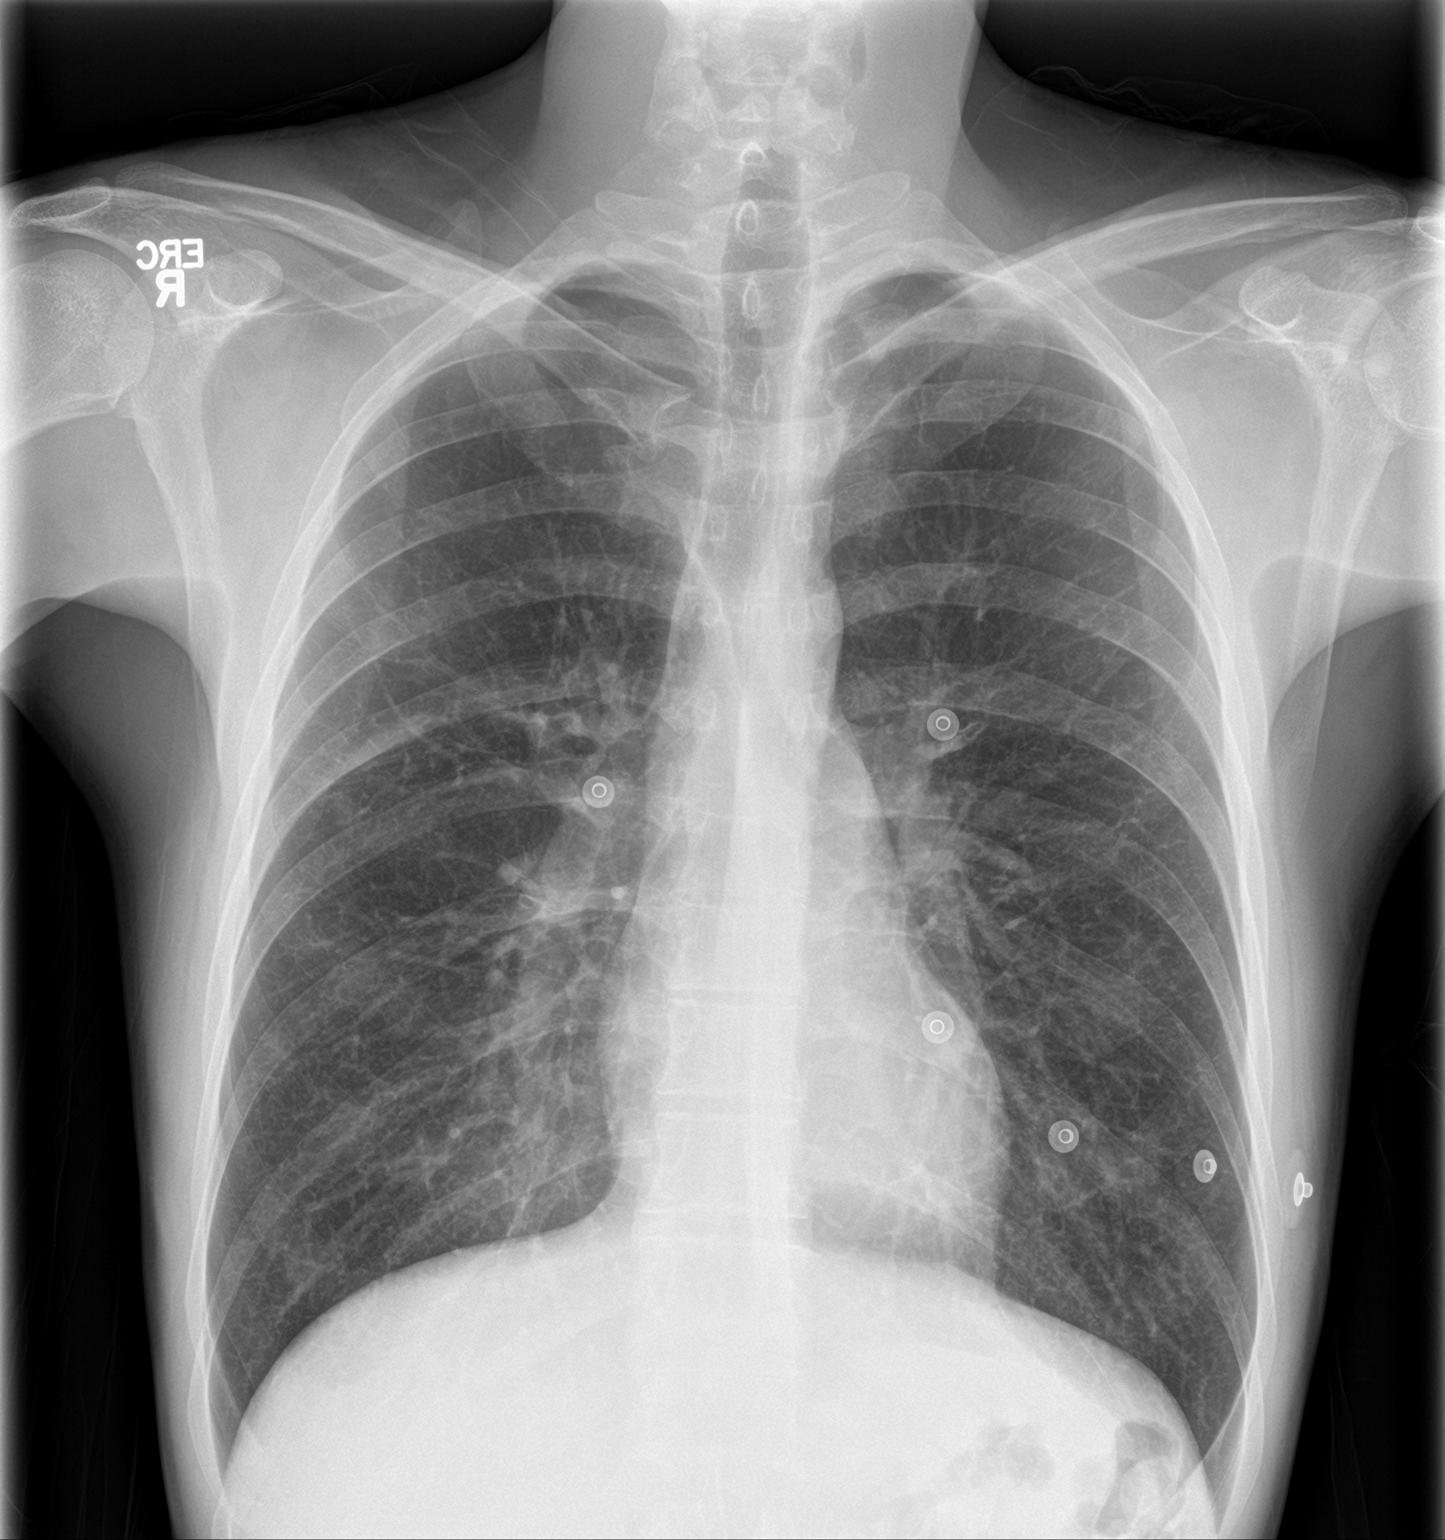
[im 2/2]
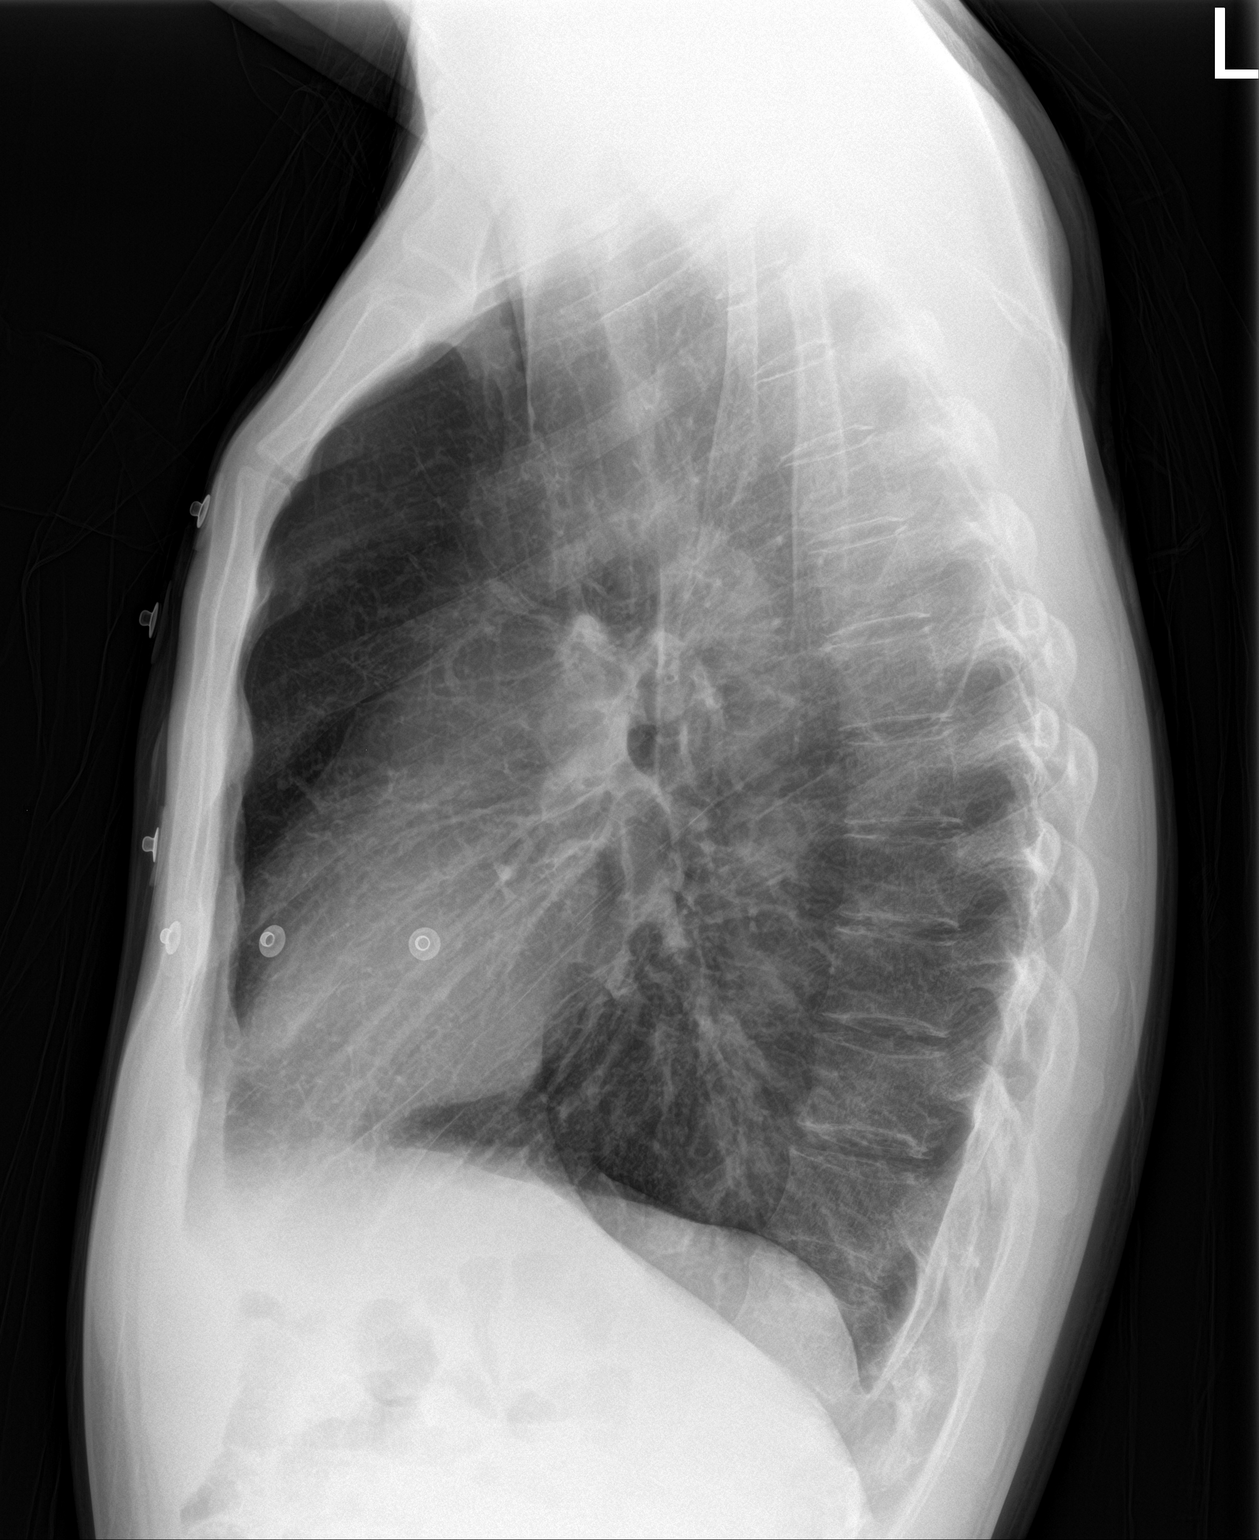

[2 of 2 positions shown; findings below may reference images not displayed]

FINDINGS: The lungs are clear. The pulmonary vasculature is normal. Heart size
is normal. Hilar and mediastinal contours are unremarkable. There is
no pleural effusion.
IMPRESSION: No active cardiopulmonary disease.

## 2018-01-31 ENCOUNTER — Emergency Department
Admission: EM | Admit: 2018-01-31 | Discharge: 2018-01-31 | Disposition: A | Discharge status: Home / Self Care | Payer: Self-pay | Attending: Emergency Medicine | Admitting: Emergency Medicine

## 2018-01-31 ENCOUNTER — Other Ambulatory Visit: Payer: Self-pay

## 2018-01-31 DIAGNOSIS — F259 Schizoaffective disorder, unspecified: Secondary | ICD-10-CM

## 2018-01-31 DIAGNOSIS — F32A Depression, unspecified: Secondary | ICD-10-CM

## 2018-01-31 DIAGNOSIS — F329 Major depressive disorder, single episode, unspecified: Secondary | ICD-10-CM | POA: Insufficient documentation

## 2018-01-31 DIAGNOSIS — F19980 Other psychoactive substance use, unspecified with psychoactive substance-induced anxiety disorder: Secondary | ICD-10-CM | POA: Diagnosis present

## 2018-01-31 DIAGNOSIS — F319 Bipolar disorder, unspecified: Secondary | ICD-10-CM

## 2018-01-31 DIAGNOSIS — Z79899 Other long term (current) drug therapy: Secondary | ICD-10-CM | POA: Insufficient documentation

## 2018-01-31 LAB — COMPREHENSIVE METABOLIC PANEL
ALK PHOS: 111 U/L (ref 38–126)
ALT: 229 U/L — ABNORMAL HIGH (ref 17–63)
ANION GAP: 10 (ref 5–15)
AST: 104 U/L — ABNORMAL HIGH (ref 15–41)
Albumin: 4.2 g/dL (ref 3.5–5.0)
BUN: 16 mg/dL (ref 6–20)
CALCIUM: 9.6 mg/dL (ref 8.9–10.3)
CO2: 25 mmol/L (ref 22–32)
Chloride: 104 mmol/L (ref 101–111)
Creatinine, Ser: 0.86 mg/dL (ref 0.61–1.24)
GFR calc Af Amer: >60 mL/min (ref >60)
GFR calc non Af Amer: >60 mL/min (ref >60)
Glucose, Bld: 122 mg/dL — ABNORMAL HIGH (ref 65–99)
Potassium: 4.1 mmol/L (ref 3.5–5.1)
SODIUM: 139 mmol/L (ref 135–145)
Total Bilirubin: 0.7 mg/dL (ref 0.3–1.2)
Total Protein: 8 g/dL (ref 6.5–8.1)

## 2018-01-31 LAB — CBC
HCT: 47.9 % (ref 40.0–52.0)
Hemoglobin: 16.2 g/dL (ref 13.0–18.0)
MCH: 31.1 pg (ref 26.0–34.0)
MCHC: 33.8 g/dL (ref 32.0–36.0)
MCV: 92.2 fL (ref 80.0–100.0)
Platelets: 328 10*3/uL (ref 150–440)
RBC: 5.19 MIL/uL (ref 4.40–5.90)
RDW: 16 % — ABNORMAL HIGH (ref 11.5–14.5)
WBC: 10.2 10*3/uL (ref 3.8–10.6)

## 2018-01-31 LAB — SALICYLATE LEVEL: Salicylate Lvl: <7 mg/dL (ref 2.8–30.0)

## 2018-01-31 LAB — ACETAMINOPHEN LEVEL

## 2018-01-31 LAB — ETHANOL: Alcohol, Ethyl (B): <10 mg/dL (ref <10)

## 2018-01-31 MED ORDER — ALUM & MAG HYDROXIDE-SIMETH 200-200-20 MG/5ML PO SUSP: 30.0000 mL | Freq: Four times a day (QID) | ORAL | Status: DC | PRN

## 2018-01-31 MED ORDER — QUETIAPINE FUMARATE 50 MG PO TABS: 50.0000 mg | ORAL_TABLET | Freq: Every day | ORAL | 1 refills | Status: DC

## 2018-01-31 NOTE — Consult Note (Signed)
Psychiatry: Case reviewed.  Chart reviewed.  Spoke with patient.  [age] year old man with a history of mood instability.  Recently feeling more depressed and anxious.  Patient does not have acute psychotic symptoms.  He denies any suicidal thoughts at all.  His behavior is calm.  He is already planning to follow-up with [facility].  Patient has been given a prescription for Seroquel 50 mg at night which was his previous psychiatric medicine.  He is agreeable to the plan.  He can be discharged home and follow-up as scheduled.  Full note to follow.

## 2018-01-31 NOTE — ED Notes (Signed)

## 2018-01-31 NOTE — ED Notes (Signed)
Patient admitted to the unit.Made comfortable in room.No distress  Or behavioral issues noted.

## 2018-01-31 NOTE — Consult Note (Signed)
[place] Psychiatry Consult   Reason for Consult: Consult for 33 year old man with a history of mood instability and substance abuse who came voluntarily to the emergency room Referring Physician: Rip Harbour Patient Identification: Nathan [name] MRN:  [id number] Principal Diagnosis: Bipolar 1 disorder, depressed ([place]) Diagnosis:   Patient Active Problem List   Diagnosis Date Noted  . Bipolar 1 disorder, depressed ([place]) [F31.9] 01/31/2018  . Bipolar 1 disorder, mixed, severe ([place]) [F31.63] 04/22/2016  . Alcohol use disorder, mild, abuse [F10.10] 04/22/2016  . Suicidal ideation [R45.851] 04/22/2016  . Tobacco use disorder [F17.200] 04/22/2016  . Substance or medication-induced anxiety disorder [facility]) [F19.980] 04/21/2016    Total Time spent with patient: 1 hour  Subjective:   Nathan [name] is a 32 y.o. male patient admitted with "I came in before things got too bad".  HPI: Patient interviewed.  Chart reviewed.  Patient known from previous encounters.  32 year old man with a history of mood instability and substance abuse.  He tells me that for the past 2-3 days he had been feeling "depressed".  He has been anxious and felt like his mood was unstable.  He has been having more trouble sleeping.  He denies however having any suicidal thoughts at all.  He also denies any auditory or visual hallucinations and does not express any psychosis.  He claims he has been avoiding all drugs and alcohol.  Patient is not currently taking any psychiatric medicine and had not been following up with a provider although he says that he has a scheduled appointment to go back to [place] next month.  Patient says he cannot think of any reason why he is feeling worse currently.  Social history: Patient is still working.  Lives with some extended family.  Denies having any major life stresses.  Medical history: No significant ongoing medical problems  Substance abuse history: History of alcohol and  cannabis abuse although he insists he is staying clean recently.  Past Psychiatric History: Patient has had previous hospitalizations and has a past history of self injury.  He was treated with Seroquel and had a diagnosis of bipolar disorder in the past.  No history of violence to others.  He has not been admitted to the hospital in quite a while.    Risk to Self: Suicidal Ideation: No Suicidal Intent: No Is patient at risk for suicide?: No Suicidal Plan?: No Access to Means: No What has been your use of drugs/alcohol within the last 12 months?: None reported  How many times?: 0 Other Self Harm Risks: 0 Triggers for Past Attempts: Other (Comment)(None reported ) Intentional Self Injurious Behavior: None Risk to Others: Homicidal Ideation: No Thoughts of Harm to Others: No Current Homicidal Intent: No Current Homicidal Plan: No Access to Homicidal Means: No Identified Victim: None reported  History of harm to others?: No Assessment of Violence: None Noted Violent Behavior Description: None reported  Does patient have access to weapons?: No Criminal Charges Pending?: Yes Describe Pending Criminal Charges: driving while impaired, revoked drivers license, driver infraction Does patient have a court date: Yes Court Date: 02/10/18 Prior Inpatient Therapy: Prior Inpatient Therapy: Yes Prior Therapy Dates: 04/22/2016; 06/03/2012 Prior Therapy Facilty/Provider(s): [facility]  Reason for Treatment: depression Prior Outpatient Therapy: Prior Outpatient Therapy: Yes Prior Therapy Dates: 2018-current Prior Therapy Facilty/Provider(s): [facility] Reason for Treatment: depressino Does patient have an ACCT team?: No Does patient have Intensive In-House Services?  : No Does patient have [facility] services? : No Does patient have P4CC services?: No  Past Medical History:  Past Medical History:  Diagnosis Date  . Bipolar 1 disorder ([place])   . Chronic headache   . Hep C w/o coma, chronic ([place])   .  Horseshoe kidney     Past Surgical History:  Procedure Laterality Date  . TONSILLECTOMY     Family History:  Family History  Problem Relation Age of Onset  . Hypertension Mother   . Diabetes Mother   . Hypertension Father   . Heart failure Father   . Diabetes Father    Family Psychiatric  History: He denies knowing of any Social History:  Social History   Substance and Sexual Activity  Alcohol Use Yes     Social History   Substance and Sexual Activity  Drug Use No    Social History   Socioeconomic History  . Marital status: Single    Spouse name: None  . Number of children: None  . Years of education: None  . Highest education level: None  Social Needs  . Financial resource strain: None  . Food insecurity - worry: None  . Food insecurity - inability: None  . Transportation needs - medical: None  . Transportation needs - non-medical: None  Occupational History  . None  Tobacco Use  . Smoking status: Current Every Day Smoker    Packs/day: 1.50    Types: Cigarettes  . Smokeless tobacco: Current User    Types: Chew  Substance and Sexual Activity  . Alcohol use: Yes  . Drug use: No  . Sexual activity: Yes  Other Topics Concern  . None  Social History Narrative  . None   Additional Social History:    Allergies:   Allergies  Allergen Reactions  . Geodon [Ziprasidone Hcl] Other (See Comments)    Seizures    Labs:  Results for orders placed or performed during the hospital encounter of 01/31/18 (from the past 48 hour(s))  Comprehensive metabolic panel     Status: Abnormal   Collection Time: 01/31/18  7:55 AM  Result Value Ref Range   Sodium 139 135 - 145 mmol/L   Potassium 4.1 3.5 - 5.1 mmol/L   Chloride 104 101 - 111 mmol/L   CO2 25 22 - 32 mmol/L   Glucose, Bld 122 (H) 65 - 99 mg/dL   BUN 16 6 - 20 mg/dL   Creatinine, Ser 0.86 0.61 - 1.24 mg/dL   Calcium 9.6 8.9 - 10.3 mg/dL   Total Protein 8.0 6.5 - 8.1 g/dL   Albumin 4.2 3.5 - 5.0 g/dL    AST 104 (H) 15 - 41 U/L   ALT 229 (H) 17 - 63 U/L   Alkaline Phosphatase 111 38 - 126 U/L   Total Bilirubin 0.7 0.3 - 1.2 mg/dL   GFR calc non Af Amer >60 >60 mL/min   GFR calc Af Amer >60 >60 mL/min    Comment: (NOTE) The eGFR has been calculated using the CKD EPI equation. This calculation has not been validated in all clinical situations. eGFR's persistently <60 mL/min signify possible Chronic Kidney Disease.    Anion gap 10 5 - 15    Comment: Performed at [facility], [address] [place], [place] [address]  Ethanol     Status: None   Collection Time: 01/31/18  7:55 AM  Result Value Ref Range   Alcohol, Ethyl (B) <10 <10 mg/dL    Comment:        LOWEST DETECTABLE LIMIT FOR SERUM ALCOHOL IS 10 mg/dL  FOR MEDICAL PURPOSES ONLY Performed at [facility], [address] [place], [place] [address]   Salicylate level     Status: None   Collection Time: 01/31/18  7:55 AM  Result Value Ref Range   Salicylate Lvl <5.7 2.8 - 30.0 mg/dL    Comment: Performed at [facility], [address] [place], [place] [address]  Acetaminophen level     Status: Abnormal   Collection Time: 01/31/18  7:55 AM  Result Value Ref Range   Acetaminophen (Tylenol), Serum <10 (L) 10 - 30 ug/mL    Comment:        THERAPEUTIC CONCENTRATIONS VARY SIGNIFICANTLY. A RANGE OF 10-30 ug/mL MAY BE AN EFFECTIVE CONCENTRATION FOR MANY PATIENTS. HOWEVER, SOME ARE BEST TREATED AT CONCENTRATIONS OUTSIDE THIS RANGE. ACETAMINOPHEN CONCENTRATIONS >150 ug/mL AT 4 HOURS AFTER INGESTION AND >50 ug/mL AT 12 HOURS AFTER INGESTION ARE OFTEN ASSOCIATED WITH TOXIC REACTIONS. Performed at [facility], [address] [place], [place] [address]   cbc     Status: Abnormal   Collection Time: 01/31/18  7:55 AM  Result Value Ref Range   WBC 10.2 3.8 - 10.6 K/uL   RBC 5.19 4.40 - 5.90 MIL/uL   Hemoglobin 16.2 13.0 - 18.0 g/dL   HCT 47.9 40.0 - 52.0 %   MCV 92.2 80.0 - 100.0 fL    MCH 31.1 26.0 - 34.0 pg   MCHC 33.8 32.0 - 36.0 g/dL   RDW 16.0 (H) 11.5 - 14.5 %   Platelets 328 150 - 440 K/uL    Comment: Performed at [facility], [address] [place], [place] [address]    No current facility-administered medications for this encounter.    Current Outpatient Medications  Medication Sig Dispense Refill  . diazepam (VALIUM) 5 MG tablet Take 2.5 mg by mouth every 12 (twelve) hours as needed for anxiety.     . diphenhydrAMINE (BENADRYL) 25 mg capsule Take 2 capsules (50 mg total) by mouth every 6 (six) hours as needed. 60 capsule 0  . ketorolac (TORADOL) 10 MG tablet Take 1 tablet (10 mg total) by mouth every 6 (six) hours as needed for moderate pain. 12 tablet 0  . Ledipasvir-Sofosbuvir (HARVONI) 90-400 MG TABS Take by mouth 1 day or 1 dose.    Marland Kitchen QUEtiapine (SEROQUEL) 50 MG tablet Take 1 tablet (50 mg total) by mouth at bedtime. 30 tablet 1  . traZODone (DESYREL) 50 MG tablet Take 25 mg by mouth at bedtime.      Musculoskeletal: Strength & Muscle Tone: within normal limits Gait & Station: normal Patient leans: N/A  Psychiatric Specialty Exam: Physical Exam  Nursing note and vitals reviewed. Constitutional: He appears well-developed and well-nourished.  HENT:  Head: Normocephalic and atraumatic.  Eyes: Conjunctivae are normal. Pupils are equal, round, and reactive to light.  Neck: Normal range of motion.  Cardiovascular: Regular rhythm and normal heart sounds.  Respiratory: Effort normal. No respiratory distress.  GI: Soft.  Musculoskeletal: Normal range of motion.  Neurological: He is alert.  Skin: Skin is warm and dry.  Psychiatric: His speech is normal and behavior is normal. Judgment and thought content normal. His mood appears anxious. Cognition and memory are normal. He exhibits a depressed mood.    Review of Systems  Constitutional: Negative.   HENT: Negative.   Eyes: Negative.   Respiratory: Negative.   Cardiovascular: Negative.    Gastrointestinal: Negative.   Musculoskeletal: Negative.   Skin: Negative.   Neurological: Negative.  Psychiatric/Behavioral: Positive for depression. Negative for hallucinations, substance abuse and suicidal ideas. The patient is nervous/anxious and has insomnia.     Blood pressure 120/71, pulse 88, temperature 98.3 F (36.8 C), temperature source Oral, resp. rate 18, height '5\' 9"'  (1.753 m), weight 120 lb (54.4 kg), SpO2 99 %.Body mass index is 17.72 kg/m.  General Appearance: Casual  Eye Contact:  Good  Speech:  Clear and Coherent  Volume:  Normal  Mood:  Anxious and Depressed  Affect:  Appropriate  Thought Process:  Coherent  Orientation:  Full (Time, Place, and Person)  Thought Content:  Logical  Suicidal Thoughts:  No  Homicidal Thoughts:  No  Memory:  Immediate;   Fair Recent;   Fair Remote;   Fair  Judgement:  Good  Insight:  Fair  Psychomotor Activity:  Normal  Concentration:  Concentration: Fair  Recall:  [employer] of Knowledge:  Fair  Language:  Fair  Akathisia:  No  Handed:  Right  AIMS (if indicated):     Assets:  Desire for Improvement Housing Physical Health Social Support  ADL's:  Intact  Cognition:  WNL  Sleep:        Treatment Plan Summary: Medication management and Plan Patient is not currently in need of psychiatric hospitalization.  He agrees that it would be reasonable to start back on his medicine.  Prescription written for Seroquel 50 mg at night.  He will follow up at [facility] next month.  Case reviewed with emergency room physician and TTS.  Patient may be discharged from the emergency room.  Disposition: No evidence of imminent risk to self or others at present.   Patient does not meet criteria for psychiatric inpatient admission. Supportive therapy provided about ongoing stressors.  Alethia Berthold, MD 01/31/2018 10:27 PM
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
## 2018-01-31 NOTE — ED Triage Notes (Signed)
Pt comes into the ED via [place] sheriff Voluntarily, states he has been stressed out and feeling depressed and "instead of waiting until he messes up and does something stupid" he came in today voluntarily. Denies SI/HI at this time.

## 2018-01-31 NOTE — Discharge Instructions (Signed)
Please take your Seroquel as directed. Please follow-up with [facility]. Please return for any further problems.

## 2018-01-31 NOTE — ED Notes (Signed)
First Nurse Note:  Patient here to be voluntarily committed because of depression.  Accompanied by [employer] Personnel.

## 2018-01-31 NOTE — ED Notes (Signed)
Pt unable to urinate at this time. Given specimen cup for when is able to void. 

## 2018-01-31 NOTE — ED Notes (Signed)
Pt denies SI/HI/AVH. Pt given discharge instructions. Pt states understanding. Pt states receipt of all belongings.   

## 2018-01-31 NOTE — ED Provider Notes (Signed)
[facility] Emergency Department Provider Note   ____________________________________________    I have reviewed the triage vital signs and the nursing notes.   HISTORY  Chief Complaint Psychiatric Evaluation     [name] [name] is a 32 y.o. male who presents for evaluation by psychiatry for possible medication management.  Because he has been feeling depressed recently.  Patient reports in the past he has become more and more depressed than last out or self injured, he is attempting to avoid this by coming to the ED today to speak with psychiatry.  He is voluntary.  No SI or HI.  He says he is doing this for his 31-year-old son.  Has no medical complaints besides chronic sinusitis.  Reports symptoms started over the last week  Past Medical History:  Diagnosis Date  . Bipolar 1 disorder (HCC)   . Chronic headache   . 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 Horseshoe kidney     Patient Active Problem List   Diagnosis Date Noted  . Bipolar 1 disorder, mixed, severe (HCC) 04/22/2016  . Alcohol use disorder, mild, abuse 04/22/2016  . Suicidal ideation 04/22/2016  . Tobacco use disorder 04/22/2016  . 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 TONSILLECTOMY      Prior to Admission medications   Medication Sig Start Date End Date Taking? Authorizing Provider  diazepam (VALIUM) 5 MG tablet Take 2.5 mg by mouth every 12 (twelve) hours as needed for anxiety.     [provider]  diphenhydrAMINE (BENADRYL) 25 mg capsule Take 2 capsules (50 mg total) by mouth every 6 (six) hours as needed. 03/12/17   Sharman CheekStafford, Phillip, MD  ketorolac (TORADOL) 10 MG tablet Take 1 tablet (10 mg total) by mouth every 6 (six) hours as needed for moderate pain. 03/12/17   Sharman CheekStafford, Phillip, MD  Ledipasvir-Sofosbuvir (HARVONI) 90-400 MG TABS Take by mouth 1 day or 1 dose.    [provider]  traZODone (DESYREL) 50 MG tablet Take 25 mg by mouth at bedtime.    [provider]     Allergies Geodon [ziprasidone hcl]  Family History  Problem Relation Age of Onset  . Hypertension Mother   . Diabetes Mother   . Hypertension Father   . Heart failure Father   . Diabetes Father     Social History Social History   Tobacco Use  . Smoking status: Current Every Day Smoker    Packs/day: 1.50    Types: Cigarettes  . Smokeless tobacco: Current User    Types: Chew  Substance Use Topics  . Alcohol use: Yes  . Drug use: No    Review of Systems  Constitutional: No fever/chills Eyes: No visual changes.  ENT: Chronic sinus congestion Cardiovascular: Denies chest pain. Respiratory: Denies shortness of breath. Gastrointestinal: No abdominal pain.  Genitourinary: Negative for dysuria. Musculoskeletal: Negative for back pain. Skin: No self injury Neurological: Negative for headaches    ____________________________________________   PHYSICAL EXAM:  VITAL SIGNS: ED Triage Vitals  Enc Vitals Group     BP 01/31/18 0757 139/85     Pulse Rate 01/31/18 0757 96     Resp 01/31/18 0757 18     Temp 01/31/18 0757 98.4 F (36.9 C)     Temp Source 01/31/18 0757 Oral     SpO2 01/31/18 0757 97 %     Weight 01/31/18 0758  54.4 kg (120 lb)     Height 01/31/18 0758 1.753 m (5\' 9" )     Head Circumference --      Peak Flow --      Pain Score --      Pain Loc --      Pain Edu? --      Excl. in GC? --     Constitutional: Alert and oriented. No acute distress.  Eyes: Conjunctivae are normal.   Nose: No congestion/rhinnorhea. Mouth/Throat: Mucous membranes are moist.    Cardiovascular: Normal rate, regular rhythm.  Good peripheral circulation. Respiratory: Normal respiratory effort.  No retractions.  Gastrointestinal: Soft and nontender. No distention.  Musculoskeletal: .  Warm and well perfused Neurologic:  Normal speech and language. No gross focal  neurologic deficits are appreciated.  Skin:  Skin is warm, dry and intact.  No evidence of new self injury Psychiatric: Mood and affect are normal. Speech and behavior are normal.  ____________________________________________   LABS (all labs ordered are listed, but only abnormal results are displayed)  Labs Reviewed  COMPREHENSIVE METABOLIC PANEL - Abnormal; Notable for the following components:      Result Value   Glucose, Bld 122 (*)    AST 104 (*)    ALT 229 (*)    All other components within normal limits  ACETAMINOPHEN LEVEL - Abnormal; Notable for the following components:   Acetaminophen (Tylenol), Serum <10 (*)    All other components within normal limits  CBC - Abnormal; Notable for the following components:   RDW 16.0 (*)    All other components within normal limits  ETHANOL  SALICYLATE LEVEL  URINE DRUG SCREEN, QUALITATIVE ([facility] ONLY)   ____________________________________________  EKG  None ____________________________________________  RADIOLOGY  None ____________________________________________   PROCEDURES  Procedure(s) performed: No  Procedures   Critical Care performed: No ____________________________________________   INITIAL IMPRESSION / ASSESSMENT AND PLAN / ED COURSE  Pertinent labs & imaging results that were available during my care of the patient were reviewed by me and considered in my medical decision making (see chart for details).  Patient presents with reports of depression, history of SI in the past but he denies that today.  Denies HI.  Comes in voluntarily, I will consult psychiatry for evaluation and help with his medications.  He is medically cleared    ____________________________________________   FINAL CLINICAL IMPRESSION(S) / ED DIAGNOSES  Final diagnoses:  Depression, unspecified depression type        Note:  This document was prepared using Dragon voice recognition software and may include unintentional  dictation errors.     Jene Every, MD 01/31/18 [phone]

## 2018-01-31 NOTE — ED Notes (Signed)
Pt  Vol  Pending  Consult  Pt  Moved  To [facility]

## 2018-04-11 ENCOUNTER — Other Ambulatory Visit: Payer: Self-pay

## 2018-04-11 ENCOUNTER — Emergency Department
Admission: EM | Admit: 2018-04-11 | Discharge: 2018-04-11 | Disposition: A | Discharge status: Home / Self Care | Payer: Self-pay | Attending: Emergency Medicine | Admitting: Emergency Medicine

## 2018-04-11 ENCOUNTER — Encounter: Payer: Self-pay | Admitting: Emergency Medicine

## 2018-04-11 DIAGNOSIS — F17210 Nicotine dependence, cigarettes, uncomplicated: Secondary | ICD-10-CM | POA: Insufficient documentation

## 2018-04-11 DIAGNOSIS — R45850 Homicidal ideations: Secondary | ICD-10-CM | POA: Insufficient documentation

## 2018-04-11 DIAGNOSIS — F1010 Alcohol abuse, uncomplicated: Secondary | ICD-10-CM | POA: Insufficient documentation

## 2018-04-11 DIAGNOSIS — F19980 Other psychoactive substance use, unspecified with psychoactive substance-induced anxiety disorder: Secondary | ICD-10-CM | POA: Insufficient documentation

## 2018-04-11 DIAGNOSIS — F319 Bipolar disorder, unspecified: Secondary | ICD-10-CM | POA: Insufficient documentation

## 2018-04-11 DIAGNOSIS — Z79899 Other long term (current) drug therapy: Secondary | ICD-10-CM | POA: Insufficient documentation

## 2018-04-11 LAB — COMPREHENSIVE METABOLIC PANEL
ALBUMIN: 4.8 g/dL (ref 3.5–5.0)
ALK PHOS: 123 U/L (ref 38–126)
ALT: 513 U/L — AB (ref 17–63)
AST: 204 U/L — AB (ref 15–41)
Anion gap: 8 (ref 5–15)
BUN: 9 mg/dL (ref 6–20)
CALCIUM: 8.7 mg/dL — AB (ref 8.9–10.3)
CHLORIDE: 104 mmol/L (ref 101–111)
CO2: 25 mmol/L (ref 22–32)
Creatinine, Ser: 0.73 mg/dL (ref 0.61–1.24)
GFR calc non Af Amer: >60 mL/min (ref >60)
GLUCOSE: 135 mg/dL — AB (ref 65–99)
Potassium: 4.3 mmol/L (ref 3.5–5.1)
SODIUM: 137 mmol/L (ref 135–145)
Total Bilirubin: 0.4 mg/dL (ref 0.3–1.2)
Total Protein: 8.7 g/dL — ABNORMAL HIGH (ref 6.5–8.1)

## 2018-04-11 LAB — CBC
HEMATOCRIT: 49.9 % (ref 40.0–52.0)
Hemoglobin: 17.1 g/dL (ref 13.0–18.0)
MCH: 32.2 pg (ref 26.0–34.0)
MCHC: 34.1 g/dL (ref 32.0–36.0)
MCV: 94.4 fL (ref 80.0–100.0)
Platelets: 335 10*3/uL (ref 150–440)
RBC: 5.29 MIL/uL (ref 4.40–5.90)
RDW: 15.5 % — ABNORMAL HIGH (ref 11.5–14.5)
WBC: 8.2 10*3/uL (ref 3.8–10.6)

## 2018-04-11 LAB — ETHANOL: Alcohol, Ethyl (B): 171 mg/dL — ABNORMAL HIGH (ref <10)

## 2018-04-11 MED ORDER — ACETAMINOPHEN 500 MG PO TABS
1000.0000 mg | ORAL_TABLET | Freq: Once | ORAL | Status: AC
  Administered 2018-04-11: 1000 mg via ORAL

## 2018-04-11 MED ORDER — ACETAMINOPHEN 500 MG PO TABS
ORAL_TABLET | ORAL | Status: AC
  Filled 2018-04-11: qty 1
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
## 2018-04-11 NOTE — ED Notes (Signed)
Pt dressed out into appropriate behavioral health clothing with this tech and [place] [employer] Officer in the rm. Pt calm and cooperative while dressing out. Pt belongings consist of brown flip flops, white t-shirt, black/red shorts, black boxers, metal hoop earring and a metal chain bracelet. Pt belongings placed into a pt belonging bag and labeled with a green sticker with pt label sticker on it. Pt has one bag.

## 2018-04-11 NOTE — ED Notes (Signed)
Pt unable to urinate at this time, given specimen cup for when is able to void. Pt ambulated back to rm 23.

## 2018-04-11 NOTE — ED Notes (Signed)
BEHAVIORAL HEALTH ROUNDING Patient sleeping: No. Patient alert and oriented: yes Behavior appropriate: Yes.  ; If no, describe:  Nutrition and fluids offered: yes Toileting and hygiene offered: Yes  Sitter present: q15 minute observations and security monitoring Law enforcement present: Yes    

## 2018-04-11 NOTE — ED Notes (Signed)
Brought by police for ivc from [facility].

## 2018-04-11 NOTE — ED Notes (Signed)
Pt given meal tray.

## 2018-04-11 NOTE — ED Provider Notes (Signed)
[facility] Emergency Department Provider Note   ____________________________________________   First MD Initiated Contact with Patient 04/11/18 1300     (approximate)  I have reviewed the triage vital signs and the nursing notes.   HISTORY  Chief Complaint Psychiatric Evaluation   HPI Nathan [name] is a 32 y.o. male who comes in under involuntary commitment from [facility] reportedly threatening to kill numerous people.  Patient himself denies this he said he was drinking and was drunk this morning and he is fine now he said he had a BB gun that he put on the porch because he is on probation he did not think he should have it but the police that he can have it he denies being homicidal at all.  He does complain of a headache and he wants some Tylenol for.   Past Medical History:  Diagnosis Date  . Bipolar 1 disorder (HCC)   . Chronic headache   . 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 Horseshoe kidney     Patient Active Problem List   Diagnosis Date Noted  . Bipolar 1 disorder, depressed (HCC) 01/31/2018  . Bipolar 1 disorder, mixed, severe (HCC) 04/22/2016  . Alcohol use disorder, mild, abuse 04/22/2016  . Suicidal ideation 04/22/2016  . Tobacco use disorder 04/22/2016  . 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 TONSILLECTOMY      Prior to Admission medications   Medication Sig Start Date End Date Taking? Authorizing Provider  diazepam (VALIUM) 5 MG tablet Take 2.5 mg by mouth every 12 (twelve) hours as needed for anxiety.     [provider]  diphenhydrAMINE (BENADRYL) 25 mg capsule Take 2 capsules (50 mg total) by mouth every 6 (six) hours as needed. 03/12/17   Sharman CheekStafford, Phillip, MD  ketorolac (TORADOL) 10 MG tablet Take 1 tablet (10 mg total) by mouth every 6 (six) hours as needed for moderate pain. 03/12/17   Sharman CheekStafford, Phillip, MD  Ledipasvir-Sofosbuvir  (HARVONI) 90-400 MG TABS Take by mouth 1 day or 1 dose.    [provider]  QUEtiapine (SEROQUEL) 50 MG tablet Take 1 tablet (50 mg total) by mouth at bedtime. 01/31/18   Clapacs, Jackquline DenmarkJohn T, MD  traZODone (DESYREL) 50 MG tablet Take 25 mg by mouth at bedtime.    [provider]    Allergies Geodon [ziprasidone hcl]  Family History  Problem Relation Age of Onset  . Hypertension Mother   . Diabetes Mother   . Hypertension Father   . Heart failure Father   . Diabetes Father     Social History Social History   Tobacco Use  . Smoking status: Current Every Day Smoker    Packs/day: 1.50    Types: Cigarettes  . Smokeless tobacco: Current User    Types: Chew  Substance Use Topics  . Alcohol use: Yes  . Drug use: No    Review of Systems  Constitutional: No fever/chills Eyes: No visual changes. ENT: No sore throat. Cardiovascular: Denies chest pain. Respiratory: Denies shortness of breath. Gastrointestinal: No abdominal pain.  No nausea, no vomiting.  No diarrhea.  No constipation. Genitourinary: Negative for dysuria. Musculoskeletal: Negative for back pain. Skin: Negative for rash. Neurological: Negative for headaches, focal weakness   ____________________________________________   PHYSICAL EXAM:  VITAL SIGNS: ED Triage Vitals  Enc Vitals Group     BP 04/11/18 1231 137/82     Pulse  Rate 04/11/18 1231 (!) 110     Resp 04/11/18 1231 20     Temp 04/11/18 1231 98.4 F (36.9 C)     Temp Source 04/11/18 1231 Oral     SpO2 04/11/18 1231 99 %     Weight 04/11/18 1232 121 lb (54.9 kg)     Height 04/11/18 1232 5\' 9"  (1.753 m)     Head Circumference --      Peak Flow --      Pain Score 04/11/18 1233 7     Pain Loc --      Pain Edu? --      Excl. in GC? --     Constitutional: Alert and oriented. Well appearing and in no acute distress. Eyes: Conjunctivae are normal. PERRL. EOMI. Head: Atraumatic. Nose: No congestion/rhinnorhea. Mouth/Throat: Mucous  membranes are moist.  Oropharynx non-erythematous. Neck: No stridor.   Cardiovascular: Normal rate, regular rhythm. Grossly normal heart sounds.  Good peripheral circulation. Respiratory: Normal respiratory effort.  No retractions. Lungs CTAB. Gastrointestinal: Soft and nontender. No distention. No abdominal bruits. No CVA tenderness. : Musculoskeletal: No lower extremity tenderness nor edema.  No joint effusions. Neurologic:  Normal speech and language. No gross focal neurologic deficits are appreciated. No gait instability. Skin:  Skin is warm, dry and intact. No rash noted Psychiatric: Mood and affect are normal. Speech and behavior are normal.  ____________________________________________   LABS (all labs ordered are listed, but only abnormal results are displayed)  Labs Reviewed  COMPREHENSIVE METABOLIC PANEL - Abnormal; Notable for the following components:      Result Value   Glucose, Bld 135 (*)    Calcium 8.7 (*)    Total Protein 8.7 (*)    AST 204 (*)    ALT 513 (*)    All other components within normal limits  ETHANOL - Abnormal; Notable for the following components:   Alcohol, Ethyl (B) 171 (*)    All other components within normal limits  CBC - Abnormal; Notable for the following components:   RDW 15.5 (*)    All other components within normal limits  URINE DRUG SCREEN, QUALITATIVE ([facility] ONLY)  ACETAMINOPHEN LEVEL  SALICYLATE LEVEL   ____________________________________________  EKG   ____________________________________________  RADIOLOGY  ED MD interpretation:  *  Official radiology report(s): No results found.  ____________________________________________   PROCEDURES  Procedure(s) performed:   Procedures  Critical Care performed:  ____________________________________________   INITIAL IMPRESSION / ASSESSMENT AND PLAN / ED COURSE           ____________________________________________   FINAL CLINICAL IMPRESSION(S) / ED  DIAGNOSES  Final diagnoses:  Homicidal ideation  Alcohol abuse     ED Discharge Orders    None       Note:  This document was prepared using Dragon voice recognition software and may include unintentional dictation errors.    Arnaldo Natal, MD 04/11/18 [phone]

## 2018-04-11 NOTE — Consult Note (Signed)
[facility] Consult   Reason for Consult: Consult for this 32 year old man with a history of substance abuse brought here under petition from [facility] Referring Physician: Mariea Clonts Patient Identification: Nathan [name] MRN:  [id number] Principal Diagnosis: Substance or medication-induced anxiety disorder [facility]) Diagnosis:   Patient Active Problem List   Diagnosis Date Noted  . Bipolar 1 disorder, depressed ([place]) [F31.9] 01/31/2018  . Bipolar 1 disorder, mixed, severe ([place]) [F31.63] 04/22/2016  . Alcohol use disorder, mild, abuse [F10.10] 04/22/2016  . Suicidal ideation [R45.851] 04/22/2016  . Tobacco use disorder [F17.200] 04/22/2016  . Substance or medication-induced anxiety disorder [facility]) [F19.980] 04/21/2016    Total Time spent with patient: 1 hour  Subjective:   Nathan [name] is a 32 y.o. male patient admitted with "I do not need to be here".  HPI: Patient seen chart reviewed.  32 year old man well known from multiple prior encounters brought in under commitment papers from [place].  The commitment papers alleges that the patient was making threatening statements to kill family members.  Patient denies that he was doing this.  He says that this morning he was feeling upset after a phone call from his ex-girlfriend.  He admits he had been drinking this morning.  He started playing around with a BB gun which upset his mother because she did not know that it was a BB gun.  Patient then called the police himself in order to avoid by his thinking being the victim of her calling the police on him.  Patient states that his mood has generally been pretty good recently.  Says that he sleeps well.  Admits that he is drinking but says it is only a couple times a week.  Denies that he is using other drugs.  Not currently seeing anyone for outpatient mental health care.  Denies any hallucinations or psychotic symptoms.  I spoke to his mother by telephone and she actually confirmed his  story exactly.  She says that it was obvious to her that he was intoxicated this morning and that his mood only gets wild like this when he is drinking.  She says otherwise he has generally been behaving about as well as he ever does.  She does not have any fear that he is going to hurt her or himself.  Social history: Patient lives with his parents.  He is on probation.  He is going to vocational rehab.  Medical history: History of multiple self-inflicted cuts.  No active medical issues otherwise  Substance abuse history: Long history of abuse of alcohol primarily occasionally other drugs including sedatives but alcohol is certainly his preference.  No known history of seizures or delirium tremens.    Past Psychiatric History: Patient has a history of a diagnosis of bipolar disorder although it has been hard to tell exactly because his moods really are so closely tied to his intoxication.  Has been prescribed mood stabilizers in the past but has not been taking any in a couple years.  Has cut himself a lot in the past mostly while drunk no real history of violence even when he is raising his voice.    Risk to Self: Suicidal Ideation: No Suicidal Intent: No Is patient at risk for suicide?: No Suicidal Plan?: No Access to Means: No What has been your use of drugs/alcohol within the last 12 months?: ETOH  How many times?: 0 Other Self Harm Risks: None reported  Triggers for Past Attempts: Unpredictable Intentional Self Injurious Behavior: None Risk  to Others: Homicidal Ideation: No Thoughts of Harm to Others: No Current Homicidal Intent: No Current Homicidal Plan: No Access to Homicidal Means: No Identified Victim: N/A History of harm to others?: No Assessment of Violence: None Noted Violent Behavior Description: None reported  Does patient have access to weapons?: Yes (Comment) Criminal Charges Pending?: No Does patient have a court date: No Prior Inpatient Therapy: Prior Inpatient  Therapy: Yes Prior Therapy Dates: 04/22/2016 Prior Therapy Facilty/Provider(s): [facility]  Reason for Treatment: Depression Prior Outpatient Therapy: Prior Outpatient Therapy: Yes Prior Therapy Dates: Current Prior Therapy Facilty/Provider(s): [facility]  Reason for Treatment: Depression Does patient have an ACCT team?: No Does patient have Intensive In-House Services?  : No Does patient have [facility] services? : No Does patient have P4CC services?: No  Past Medical History:  Past Medical History:  Diagnosis Date  . Bipolar 1 disorder ([place])   . Chronic headache   . Hep C w/o coma, chronic ([place])   . Horseshoe kidney     Past Surgical History:  Procedure Laterality Date  . TONSILLECTOMY     Family History:  Family History  Problem Relation Age of Onset  . Hypertension Mother   . Diabetes Mother   . Hypertension Father   . Heart failure Father   . Diabetes Father    Family Psychiatric  History: History of alcohol use Social History:  Social History   Substance and Sexual Activity  Alcohol Use Yes     Social History   Substance and Sexual Activity  Drug Use No    Social History   Socioeconomic History  . Marital status: Single    Spouse name: Not on file  . Number of children: Not on file  . Years of education: Not on file  . Highest education level: Not on file  Occupational History  . Not on file  Social Needs  . Financial resource strain: Not on file  . Food insecurity:    Worry: Not on file    Inability: Not on file  . Transportation needs:    Medical: Not on file    Non-medical: Not on file  Tobacco Use  . Smoking status: Current Every Day Smoker    Packs/day: 1.50    Types: Cigarettes  . Smokeless tobacco: Current User    Types: Chew  Substance and Sexual Activity  . Alcohol use: Yes  . Drug use: No  . Sexual activity: Yes  Lifestyle  . Physical activity:    Days per week: Not on file    Minutes per session: Not on file  . Stress: Not on file   Relationships  . Social connections:    Talks on phone: Not on file    Gets together: Not on file    Attends religious service: Not on file    Active member of club or organization: Not on file    Attends meetings of clubs or organizations: Not on file    Relationship status: Not on file  Other Topics Concern  . Not on file  Social History Narrative  . Not on file   Additional Social History:    Allergies:   Allergies  Allergen Reactions  . Geodon [Ziprasidone Hcl] Other (See Comments)    Seizures    Labs:  Results for orders placed or performed during the hospital encounter of 04/11/18 (from the past 48 hour(s))  Comprehensive metabolic panel     Status: Abnormal   Collection Time: 04/11/18 12:39 PM  Result Value  Ref Range   Sodium 137 135 - 145 mmol/L   Potassium 4.3 3.5 - 5.1 mmol/L   Chloride 104 101 - 111 mmol/L   CO2 25 22 - 32 mmol/L   Glucose, Bld 135 (H) 65 - 99 mg/dL   BUN 9 6 - 20 mg/dL   Creatinine, Ser 0.73 0.61 - 1.24 mg/dL   Calcium 8.7 (L) 8.9 - 10.3 mg/dL   Total Protein 8.7 (H) 6.5 - 8.1 g/dL   Albumin 4.8 3.5 - 5.0 g/dL   AST 204 (H) 15 - 41 U/L   ALT 513 (H) 17 - 63 U/L   Alkaline Phosphatase 123 38 - 126 U/L   Total Bilirubin 0.4 0.3 - 1.2 mg/dL   GFR calc non Af Amer >60 >60 mL/min   GFR calc Af Amer >60 >60 mL/min    Comment: (NOTE) The eGFR has been calculated using the CKD EPI equation. This calculation has not been validated in all clinical situations. eGFR's persistently <60 mL/min signify possible Chronic Kidney Disease.    Anion gap 8 5 - 15    Comment: Performed at [facility], [address] [place], [place] [address]  Ethanol     Status: Abnormal   Collection Time: 04/11/18 12:39 PM  Result Value Ref Range   Alcohol, Ethyl (B) 171 (H) <10 mg/dL    Comment:        LOWEST DETECTABLE LIMIT FOR SERUM ALCOHOL IS 10 mg/dL FOR MEDICAL PURPOSES ONLY Performed at [facility], [address] [place],  [place] [address]   cbc     Status: Abnormal   Collection Time: 04/11/18 12:39 PM  Result Value Ref Range   WBC 8.2 3.8 - 10.6 K/uL   RBC 5.29 4.40 - 5.90 MIL/uL   Hemoglobin 17.1 13.0 - 18.0 g/dL   HCT 49.9 40.0 - 52.0 %   MCV 94.4 80.0 - 100.0 fL   MCH 32.2 26.0 - 34.0 pg   MCHC 34.1 32.0 - 36.0 g/dL   RDW 15.5 (H) 11.5 - 14.5 %   Platelets 335 150 - 440 K/uL    Comment: Performed at [facility], [address] [place], [place] [address]    No current facility-administered medications for this encounter.    Current Outpatient Medications  Medication Sig Dispense Refill  . diazepam (VALIUM) 5 MG tablet Take 2.5 mg by mouth every 12 (twelve) hours as needed for anxiety.     . diphenhydrAMINE (BENADRYL) 25 mg capsule Take 2 capsules (50 mg total) by mouth every 6 (six) hours as needed. 60 capsule 0  . ketorolac (TORADOL) 10 MG tablet Take 1 tablet (10 mg total) by mouth every 6 (six) hours as needed for moderate pain. 12 tablet 0  . Ledipasvir-Sofosbuvir (HARVONI) 90-400 MG TABS Take by mouth 1 day or 1 dose.    Marland Kitchen QUEtiapine (SEROQUEL) 50 MG tablet Take 1 tablet (50 mg total) by mouth at bedtime. 30 tablet 1  . traZODone (DESYREL) 50 MG tablet Take 25 mg by mouth at bedtime.      Musculoskeletal: Strength & Muscle Tone: within normal limits Gait & Station: normal Patient leans: N/A  Psychiatric Specialty Exam: Physical Exam  Nursing note and vitals reviewed. Constitutional: He appears well-developed and well-nourished.  HENT:  Head: Normocephalic and atraumatic.  Eyes: Pupils are equal, round, and reactive to light. Conjunctivae are normal.  Neck: Normal range of motion.  Cardiovascular: Regular rhythm and normal heart sounds.  Respiratory: Effort normal.  No respiratory distress.  GI: Soft.  Musculoskeletal: Normal range of motion.  Neurological: He is alert.  Skin: Skin is warm and dry.  Psychiatric: His mood appears anxious. His speech is rapid and/or pressured. He  is agitated. He is not aggressive. Thought content is not paranoid. Cognition and memory are impaired. He expresses impulsivity. He expresses no homicidal and no suicidal ideation.    Review of Systems  Constitutional: Negative.   HENT: Negative.   Eyes: Negative.   Respiratory: Negative.   Cardiovascular: Negative.   Gastrointestinal: Negative.   Musculoskeletal: Negative.   Skin: Negative.   Neurological: Negative.   Psychiatric/Behavioral: Positive for substance abuse. Negative for depression, hallucinations, memory loss and suicidal ideas. The patient is nervous/anxious. The patient does not have insomnia.     Blood pressure 137/82, pulse (!) 110, temperature 98.4 F (36.9 C), temperature source Oral, resp. rate 20, height _0  (1.753 m), weight 54.9 kg (121 lb), SpO2 99 %.Body mass index is 17.87 kg/m.  General Appearance: Disheveled  Eye Contact:  Fair  Speech:  Pressured  Volume:  Increased  Mood:  Anxious  Affect:  Labile  Thought Process:  Goal Directed  Orientation:  Full (Time, Place, and Person)  Thought Content:  Logical  Suicidal Thoughts:  No  Homicidal Thoughts:  No  Memory:  Immediate;   Fair Recent;   Fair Remote;   Fair  Judgement:  Fair  Insight:  Fair  Psychomotor Activity:  Decreased  Concentration:  Concentration: Fair  Recall:  [employer] of Knowledge:  Fair  Language:  Fair  Akathisia:  No  Handed:  Right  AIMS (if indicated):     Assets:  Desire for Improvement Housing Physical Health  ADL's:  Intact  Cognition:  WNL  Sleep:        Treatment Plan Summary: Plan 32 year old man with a blood alcohol level almost 200 many hours after initially presenting.  Certainly must of been intoxicated much more this morning.  We are very familiar with this patient and know his history.  Although he can come across as seeming very manic when he is intoxicated he does not really have a history of psychosis and when he sobers up he calms down.  Family does  not think he is a danger to them or anyone else.  Patient was counseled about the obvious dangers of continued drinking.  He says he is intending to get back involved with [facility].  Case reviewed with TTS and emergency room physician.  Discontinue IVC.  Patient can be released from the emergency room.  Disposition: No evidence of imminent risk to self or others at present.   Patient does not meet criteria for psychiatric inpatient admission. Supportive therapy provided about ongoing stressors. Discussed crisis plan, support from social network, calling 911, coming to the Emergency Department, and calling Suicide Hotline.  Alethia Berthold, MD 04/11/2018 5:04 PM

## 2018-04-11 NOTE — Discharge Instructions (Addendum)
Please return to the emergency department for thoughts of hurting yourself or anyone else, hallucinations, or any other symptoms concerning to you. °

## 2018-04-11 NOTE — ED Notes (Signed)
PT  FROM  [facility] UNDER  IVC  PENDING  CONSULT

## 2018-04-11 NOTE — ED Notes (Signed)

## 2018-06-06 ENCOUNTER — Other Ambulatory Visit: Payer: Self-pay

## 2018-06-06 ENCOUNTER — Emergency Department
Admission: EM | Admit: 2018-06-06 | Discharge: 2018-06-07 | Disposition: A | Discharge status: Home / Self Care | Payer: Self-pay | Attending: Emergency Medicine | Admitting: Emergency Medicine

## 2018-06-06 ENCOUNTER — Emergency Department: Payer: Self-pay

## 2018-06-06 DIAGNOSIS — Z79899 Other long term (current) drug therapy: Secondary | ICD-10-CM | POA: Insufficient documentation

## 2018-06-06 DIAGNOSIS — F17210 Nicotine dependence, cigarettes, uncomplicated: Secondary | ICD-10-CM | POA: Insufficient documentation

## 2018-06-06 DIAGNOSIS — R079 Chest pain, unspecified: Secondary | ICD-10-CM | POA: Insufficient documentation

## 2018-06-06 DIAGNOSIS — F17220 Nicotine dependence, chewing tobacco, uncomplicated: Secondary | ICD-10-CM | POA: Insufficient documentation

## 2018-06-06 LAB — URINE DRUG SCREEN, QUALITATIVE (ARMC ONLY)
Amphetamines, Ur Screen: NOT DETECTED
Benzodiazepine, Ur Scrn: POSITIVE — AB
COCAINE METABOLITE, UR ~~LOC~~: NOT DETECTED
Cannabinoid 50 Ng, Ur ~~LOC~~: POSITIVE — AB
MDMA (ECSTASY) UR SCREEN: NOT DETECTED
METHADONE SCREEN, URINE: NOT DETECTED
Opiate, Ur Screen: NOT DETECTED
Phencyclidine (PCP) Ur S: NOT DETECTED
TRICYCLIC, UR SCREEN: NOT DETECTED

## 2018-06-06 LAB — BASIC METABOLIC PANEL
ANION GAP: 14 (ref 5–15)
BUN: 10 mg/dL (ref 6–20)
CO2: 18 mmol/L — ABNORMAL LOW (ref 22–32)
Calcium: 9.1 mg/dL (ref 8.9–10.3)
Chloride: 105 mmol/L (ref 101–111)
Creatinine, Ser: 0.63 mg/dL (ref 0.61–1.24)
Glucose, Bld: 79 mg/dL (ref 65–99)
POTASSIUM: 3.7 mmol/L (ref 3.5–5.1)
SODIUM: 137 mmol/L (ref 135–145)

## 2018-06-06 LAB — CBC
HEMATOCRIT: 44.8 % (ref 40.0–52.0)
HEMOGLOBIN: 15.4 g/dL (ref 13.0–18.0)
MCH: 31.8 pg (ref 26.0–34.0)
MCHC: 34.3 g/dL (ref 32.0–36.0)
MCV: 92.7 fL (ref 80.0–100.0)
Platelets: 241 10*3/uL (ref 150–440)
RBC: 4.83 MIL/uL (ref 4.40–5.90)
RDW: 14.4 % (ref 11.5–14.5)
WBC: 12.2 10*3/uL — AB (ref 3.8–10.6)

## 2018-06-06 LAB — TROPONIN I

## 2018-06-06 MED ORDER — LORAZEPAM 2 MG/ML IJ SOLN
1.0000 mg | Freq: Once | INTRAMUSCULAR | Status: AC
  Administered 2018-06-06: 1 mg via INTRAVENOUS

## 2018-06-06 MED ORDER — IPRATROPIUM-ALBUTEROL 0.5-2.5 (3) MG/3ML IN SOLN
3.0000 mL | Freq: Once | RESPIRATORY_TRACT | Status: AC
  Administered 2018-06-06: 3 mL via RESPIRATORY_TRACT
  Filled 2018-06-06: qty 3

## 2018-06-06 MED ORDER — LORAZEPAM 2 MG/ML IJ SOLN
INTRAMUSCULAR | Status: AC
  Filled 2018-06-06: qty 1

## 2018-06-06 NOTE — ED Triage Notes (Signed)
PT arrived from home with complaints of chest pain which started in his sleep about and hour ago.  Pt describes is as "tightness" and that his left arm feels numb. Pt has Hx of anxiety, bipolar, schizophrenia, and elevated troponin levels. VS per EMS BP-125/86 HR-110-120 O2sat-100% RA BS-80. Pt took 324 of ASA. Pt has a 20 in his right AC. Pt denies drugs and alcohol currently. Pt states that he takes Buspar. Pt is alert and oriented x 4.

## 2018-06-06 NOTE — ED Provider Notes (Signed)
[facility] Emergency Department Provider Note  ____________________________________________  Time seen: Approximately 11:03 PM  I have reviewed the triage vital signs and the nursing notes.   HISTORY  Chief Complaint Chest Pain   HPI [facility] Nathan [name] is a 32 y.o. male with a history of hepatitis C, bipolar disorder, prior alcohol and cocaine abuse, smoking who presents for evaluation of chest pain.  Patient reports that the chest pain woke him up from his sleep, he describes the pain as a heavy sensation in the center of his chest associated with shortness of breath, diaphoresis, nausea, and radiating down his left arm.  Patient reports that he felt extremely anxious with this pain.  He has a history of severe anxiety.  Patient reports that he has been clean from alcohol, opiates, and cocaine for a long time now.  He reports that the pain initially was a 10 out of 10 however that has subsided to a 5 out of 10.  Patient is extremely anxious at this time.  Has a strong family history of ischemic heart disease.  He also reports a history of mild emphysema due to smoking.  Patient denies personal or family history of blood clots, recent travel immobilization, leg pain or swelling, hemoptysis, exogenous hormones, or history of cancer.  Past Medical History:  Diagnosis Date  . Bipolar 1 disorder (HCC)   . Chronic headache   . 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 Horseshoe kidney     Patient Active Problem List   Diagnosis Date Noted  . Bipolar 1 disorder, depressed (HCC) 01/31/2018  . Bipolar 1 disorder, mixed, severe (HCC) 04/22/2016  . Alcohol use disorder, mild, abuse 04/22/2016  . Suicidal ideation 04/22/2016  . Tobacco use disorder 04/22/2016  . 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 TONSILLECTOMY      Prior to Admission medications   Medication Sig Start Date End  Date Taking? Authorizing Provider  diazepam (VALIUM) 5 MG tablet Take 2.5 mg by mouth every 12 (twelve) hours as needed for anxiety.     [provider]  diphenhydrAMINE (BENADRYL) 25 mg capsule Take 2 capsules (50 mg total) by mouth every 6 (six) hours as needed. 03/12/17   Sharman Cheek, MD  ketorolac (TORADOL) 10 MG tablet Take 1 tablet (10 mg total) by mouth every 6 (six) hours as needed for moderate pain. 03/12/17   Sharman Cheek, MD  Ledipasvir-Sofosbuvir (HARVONI) 90-400 MG TABS Take by mouth 1 day or 1 dose.    [provider]  QUEtiapine (SEROQUEL) 50 MG tablet Take 1 tablet (50 mg total) by mouth at bedtime. 01/31/18   Clapacs, Jackquline Denmark, MD  traZODone (DESYREL) 50 MG tablet Take 25 mg by mouth at bedtime.    [provider]    Allergies Geodon [ziprasidone hcl]  Family History  Problem Relation Age of Onset  . Hypertension Mother   . Diabetes Mother   . Hypertension Father   . Heart failure Father   . Diabetes Father     Social History Social History   Tobacco Use  . Smoking status: Current Every Day Smoker    Packs/day: 1.50    Types: Cigarettes  . Smokeless tobacco: Current User    Types: Chew  Substance Use Topics  . Alcohol use: Yes  . Drug use: No    Review of Systems  Constitutional: Negative for fever. Eyes: Negative for  visual changes. ENT: Negative for sore throat. Neck: No neck pain  Cardiovascular: +chest pain, diaphoresis Respiratory: +shortness of breath. Gastrointestinal: Negative for abdominal pain, vomiting or diarrhea. Genitourinary: Negative for dysuria. Musculoskeletal: Negative for back pain. Skin: Negative for rash. Neurological: Negative for headaches, weakness or numbness. Psych: No SI or HI  ____________________________________________   PHYSICAL EXAM:  VITAL SIGNS: ED Triage Vitals  Enc Vitals Group     BP 06/06/18 2126 (!) 138/127     Pulse Rate 06/06/18 2126 (!) 105     Resp 06/06/18 2126  (!) 26     Temp 06/06/18 2126 98.7 F (37.1 C)     Temp Source 06/06/18 2126 Oral     SpO2 06/06/18 2126 98 %     Weight 06/06/18 2127 120 lb (54.4 kg)     Height 06/06/18 2127 5\' 9"  (1.753 m)     Head Circumference --      Peak Flow --      Pain Score 06/06/18 2126 3     Pain Loc --      Pain Edu? --      Excl. in GC? --     Constitutional: Alert and oriented, extremely anxious, no distress.  HEENT:      Head: Normocephalic and atraumatic.         Eyes: Conjunctivae are normal. Sclera is non-icteric.       Mouth/Throat: Mucous membranes are moist.       Neck: Supple with no signs of meningismus. Cardiovascular: Tachycardic with regular rhythm. No murmurs, gallops, or rubs. 2+ symmetrical distal pulses are present in all extremities. No JVD. Respiratory: Normal respiratory effort. Lungs are clear to auscultation bilaterally with slightly diminished air movement bilaterally. No wheezes, crackles, or rhonchi.  Gastrointestinal: Soft, non tender, and non distended with positive bowel sounds. No rebound or guarding. Musculoskeletal: Nontender with normal range of motion in all extremities. No edema, cyanosis, or erythema of extremities. Neurologic: Normal speech and language. Face is symmetric. Moving all extremities. No gross focal neurologic deficits are appreciated. Skin: Skin is warm, dry and intact. No rash noted. Psychiatric: Mood and affect are normal. Speech and behavior are normal.  ____________________________________________   LABS (all labs ordered are listed, but only abnormal results are displayed)  Labs Reviewed  CBC - Abnormal; Notable for the following components:      Result Value   WBC 12.2 (*)    All other components within normal limits  BASIC METABOLIC PANEL  TROPONIN I  URINE DRUG SCREEN, QUALITATIVE ([facility] ONLY)  TROPONIN I   ____________________________________________  EKG  ED ECG REPORT I, Nita Sicklearolina Ashwini Jago, the attending physician, personally  viewed and interpreted this ECG.  Sinus tachycardia, rate of 118, normal intervals, left axis deviation, LAFB, no ST elevations or depressions.  Unchanged from prior from 2018 ____________________________________________  RADIOLOGY  I have personally reviewed the images performed during this visit and I agree with the Radiologist's read.   Interpretation by Radiologist:  Dg Chest 2 View  Result Date: 06/06/2018 CLINICAL DATA:  Chest pain beginning about our ago. Tightness and left arm numbness. Elevated troponin levels. EXAM: CHEST - 2 VIEW COMPARISON:  04/23/2017 FINDINGS: Heart size and pulmonary vascularity are normal. Hyperinflation of the lungs may indicate emphysema. No airspace disease or consolidation. No blunting of costophrenic angles. No pneumothorax. Mediastinal contours appear intact. IMPRESSION: Pulmonary hyperinflation.  No evidence of active pulmonary disease. Electronically Signed   By: Burman NievesWilliam  Stevens M.D.   On: 06/06/2018 22:15  ____________________________________________   PROCEDURES  Procedure(s) performed: None Procedures Critical Care performed:  None ____________________________________________   INITIAL IMPRESSION / ASSESSMENT AND PLAN / ED COURSE  32 y.o. male with a history of hepatitis C, bipolar disorder, prior alcohol and cocaine abuse, smoking who presents for evaluation of chest pain.  Patient is extremely anxious looking, he does have diminished air movement bilaterally and Chest x-ray concerning for pulmonary hyperinflation which would be consistent with possible COPD exacerbation. Will give duoneb. Initial EKG shows no evidence of ischemia.  ACS is also in the differential diagnosis.  First troponin is pending.  At this time will do troponin every 3 hours x 2.  Patient took full dose ASA prior to arrival. Dissection was considered however thought to be extremely unlikely in the setting of no significant hypertension, intact neurological exam, pain  not radiating to the back, normal cardiac silhouette on chest x-ray.  Anxiety is also in the differential diagnosis.  Patient has severe anxiety looks extremely anxious.  He was given a milligram of IV Ativan.  Chest x-ray shows no evidence of pneumonia or pneumothorax. Care transferred to Dr. Manson Passey.      As part of my medical decision making, I reviewed the following data within the electronic MEDICAL RECORD NUMBER Nursing notes reviewed and incorporated, Labs reviewed , EKG interpreted , Old EKG reviewed, Old chart reviewed, Radiograph reviewed , Notes from prior ED visits and [place] Controlled Substance Database    Pertinent labs & imaging results that were available during my care of the patient were reviewed by me and considered in my medical decision making (see chart for details).    ____________________________________________   FINAL CLINICAL IMPRESSION(S) / ED DIAGNOSES  Final diagnoses:  Chest pain, unspecified type      NEW MEDICATIONS STARTED DURING THIS VISIT:  ED Discharge Orders    None       Note:  This document was prepared using Dragon voice recognition software and may include unintentional dictation errors.    Don Perking, [place], MD 06/06/18 [phone]

## 2018-06-07 LAB — TROPONIN I: Troponin I: <0.03 ng/mL (ref <0.03)

## 2018-06-07 NOTE — ED Notes (Signed)
Pt left before discharge papers or results were given. Environmental services said that saw him remove his IV and toss it on the bed, IV was entact. No E-signature was signed.

## 2018-06-19 ENCOUNTER — Emergency Department
Admission: EM | Admit: 2018-06-19 | Discharge: 2018-06-19 | Disposition: A | Discharge status: Home / Self Care | Payer: Self-pay | Attending: Emergency Medicine | Admitting: Emergency Medicine

## 2018-06-19 ENCOUNTER — Other Ambulatory Visit: Payer: Self-pay

## 2018-06-19 DIAGNOSIS — F17210 Nicotine dependence, cigarettes, uncomplicated: Secondary | ICD-10-CM | POA: Insufficient documentation

## 2018-06-19 DIAGNOSIS — R456 Violent behavior: Secondary | ICD-10-CM | POA: Insufficient documentation

## 2018-06-19 DIAGNOSIS — F10929 Alcohol use, unspecified with intoxication, unspecified: Secondary | ICD-10-CM | POA: Insufficient documentation

## 2018-06-19 DIAGNOSIS — Z79899 Other long term (current) drug therapy: Secondary | ICD-10-CM | POA: Insufficient documentation

## 2018-06-19 LAB — ETHANOL: ALCOHOL ETHYL (B): 272 mg/dL — AB (ref <10)

## 2018-06-19 LAB — COMPREHENSIVE METABOLIC PANEL
ALT: 121 U/L — AB (ref 0–44)
ANION GAP: 11 (ref 5–15)
AST: 102 U/L — ABNORMAL HIGH (ref 15–41)
Albumin: 4.5 g/dL (ref 3.5–5.0)
Alkaline Phosphatase: 63 U/L (ref 38–126)
BUN: 15 mg/dL (ref 6–20)
CHLORIDE: 101 mmol/L (ref 98–111)
CO2: 28 mmol/L (ref 22–32)
CREATININE: 0.72 mg/dL (ref 0.61–1.24)
Calcium: 9.2 mg/dL (ref 8.9–10.3)
Glucose, Bld: 122 mg/dL — ABNORMAL HIGH (ref 70–99)
Potassium: 3.7 mmol/L (ref 3.5–5.1)
SODIUM: 140 mmol/L (ref 135–145)
Total Bilirubin: 0.3 mg/dL (ref 0.3–1.2)
Total Protein: 8 g/dL (ref 6.5–8.1)

## 2018-06-19 LAB — URINE DRUG SCREEN, QUALITATIVE (ARMC ONLY)
AMPHETAMINES, UR SCREEN: NOT DETECTED
Benzodiazepine, Ur Scrn: POSITIVE — AB
COCAINE METABOLITE, UR ~~LOC~~: NOT DETECTED
Cannabinoid 50 Ng, Ur ~~LOC~~: NOT DETECTED
MDMA (Ecstasy)Ur Screen: NOT DETECTED
METHADONE SCREEN, URINE: NOT DETECTED
OPIATE, UR SCREEN: NOT DETECTED
Phencyclidine (PCP) Ur S: NOT DETECTED
Tricyclic, Ur Screen: NOT DETECTED

## 2018-06-19 LAB — CBC
HEMATOCRIT: 46.7 % (ref 40.0–52.0)
HEMOGLOBIN: 16.1 g/dL (ref 13.0–18.0)
MCH: 32.1 pg (ref 26.0–34.0)
MCHC: 34.5 g/dL (ref 32.0–36.0)
MCV: 93.1 fL (ref 80.0–100.0)
Platelets: 250 10*3/uL (ref 150–440)
RBC: 5.01 MIL/uL (ref 4.40–5.90)
RDW: 14.4 % (ref 11.5–14.5)
WBC: 10.2 10*3/uL (ref 3.8–10.6)

## 2018-06-19 MED ORDER — NICOTINE 21 MG/24HR TD PT24
MEDICATED_PATCH | TRANSDERMAL | Status: AC
  Administered 2018-06-19: 21 mg
  Filled 2018-06-19: qty 1

## 2018-06-19 MED ORDER — LORAZEPAM 2 MG/ML IJ SOLN
INTRAMUSCULAR | Status: AC
  Filled 2018-06-19: qty 1

## 2018-06-19 MED ORDER — DIPHENHYDRAMINE HCL 50 MG/ML IJ SOLN
50.0000 mg | Freq: Once | INTRAMUSCULAR | Status: AC
  Administered 2018-06-19: 50 mg via INTRAVENOUS

## 2018-06-19 MED ORDER — LORAZEPAM 2 MG/ML IJ SOLN
2.0000 mg | Freq: Once | INTRAMUSCULAR | Status: AC
Start: 2018-06-19 — End: 2018-06-19
  Administered 2018-06-19: 2 mg via INTRAMUSCULAR

## 2018-06-19 NOTE — ED Provider Notes (Addendum)
[facility] Emergency Department Provider Note ________________   First MD Initiated Contact with Patient 06/19/18 0121     (approximate)  I have reviewed the triage vital signs and the nursing notes.   HISTORY  Chief Complaint Medical Clearance   HPI Nathan [name] is a 32 y.o. male with below list of chronic medical conditions presents to the emergency department police custody.  Police officer states that Nathan [name] requested to be taken to the emergency department because he was intoxicated and belligerent".  Patient denies any suicidal or homicidal ideation.  Patient states "I was just being drunk and belligerent".   Past Medical History:  Diagnosis Date  . Bipolar 1 disorder (HCC)   . Chronic headache   . 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 Horseshoe kidney     Patient Active Problem List   Diagnosis Date Noted  . Bipolar 1 disorder, depressed (HCC) 01/31/2018  . Bipolar 1 disorder, mixed, severe (HCC) 04/22/2016  . Alcohol use disorder, mild, abuse 04/22/2016  . Suicidal ideation 04/22/2016  . Tobacco use disorder 04/22/2016  . 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 TONSILLECTOMY      Prior to Admission medications   Medication Sig Start Date End Date Taking? Authorizing Provider  diazepam (VALIUM) 5 MG tablet Take 2.5 mg by mouth every 12 (twelve) hours as needed for anxiety.     [provider]  diphenhydrAMINE (BENADRYL) 25 mg capsule Take 2 capsules (50 mg total) by mouth every 6 (six) hours as needed. 03/12/17   Sharman Cheek, MD  ketorolac (TORADOL) 10 MG tablet Take 1 tablet (10 mg total) by mouth every 6 (six) hours as needed for moderate pain. 03/12/17   Sharman Cheek, MD  Ledipasvir-Sofosbuvir (HARVONI) 90-400 MG TABS Take by mouth 1 day or 1 dose.    [provider]  QUEtiapine (SEROQUEL) 50 MG tablet Take 1 tablet  (50 mg total) by mouth at bedtime. 01/31/18   Clapacs, Jackquline Denmark, MD  traZODone (DESYREL) 50 MG tablet Take 25 mg by mouth at bedtime.    [provider]    Allergies Geodon [ziprasidone hcl]  Family History  Problem Relation Age of Onset  . Hypertension Mother   . Diabetes Mother   . Hypertension Father   . Heart failure Father   . Diabetes Father     Social History Social History   Tobacco Use  . Smoking status: Current Every Day Smoker    Packs/day: 1.50    Types: Cigarettes  . Smokeless tobacco: Current User    Types: Chew  Substance Use Topics  . Alcohol use: Yes  . Drug use: No    Review of Systems Constitutional: No fever/chills Eyes: No visual changes. ENT: No sore throat. Cardiovascular: Denies chest pain. Respiratory: Denies shortness of breath. Gastrointestinal: No abdominal pain.  No nausea, no vomiting.  No diarrhea.  No constipation. Genitourinary: Negative for dysuria. Musculoskeletal: Negative for neck pain.  Negative for back pain. Integumentary: Negative for rash. Neurological: Negative for headaches, focal weakness or numbness. Psychiatric:Positive for alcohol intoxication   ____________________________________________   PHYSICAL EXAM:  VITAL SIGNS: ED Triage Vitals  Enc Vitals Group     BP 06/19/18 0246 (!) 139/96     Pulse Rate 06/19/18 0246 86     Resp 06/19/18 0246 16     Temp 06/19/18 0246 98 F (36.7 C)  Temp Source 06/19/18 0246 Oral     SpO2 06/19/18 0246 100 %     Weight 06/19/18 0247 54.4 kg (120 lb)     Height 06/19/18 0247 1.753 m (5\' 9" )     Head Circumference --      Peak Flow --      Pain Score 06/19/18 0247 0     Pain Loc --      Pain Edu? --      Excl. in GC? --     Constitutional: Alert and oriented.  Appears intoxicated eyes: Conjunctivae are normal.  Head: Atraumatic. Mouth/Throat: Mucous membranes are moist.  Oropharynx non-erythematous. Neck: No stridor.  Cardiovascular: Normal rate, regular  rhythm. Good peripheral circulation. Grossly normal heart sounds. Respiratory: Normal respiratory effort.  No retractions. Lungs CTAB. Gastrointestinal: Soft and nontender. No distention.  Musculoskeletal: No lower extremity tenderness nor edema. No gross deformities of extremities. Neurologic:  Normal speech and language. No gross focal neurologic deficits are appreciated.  Skin:  Skin is warm, dry and intact. No rash noted. Psychiatric: Appears intoxicated.  ____________________________________________   LABS (all labs ordered are listed, but only abnormal results are displayed)  Labs Reviewed  COMPREHENSIVE METABOLIC PANEL - Abnormal; Notable for the following components:      Result Value   Glucose, Bld 122 (*)    AST 102 (*)    ALT 121 (*)    All other components within normal limits  ETHANOL - Abnormal; Notable for the following components:   Alcohol, Ethyl (B) 272 (*)    All other components within normal limits  URINE DRUG SCREEN, QUALITATIVE ([facility] ONLY) - Abnormal; Notable for the following components:   Barbiturates, Ur Screen   (*)    Value: Result not available. Reagent lot number recalled by manufacturer.   Benzodiazepine, Ur Scrn POSITIVE (*)    All other components within normal limits  CBC   ___________________   Procedures   ____________________________________________   INITIAL IMPRESSION / ASSESSMENT AND PLAN / ED COURSE  As part of my medical decision making, I reviewed the following data within the electronic MEDICAL RECORD NUMBER    32 year old male presenting with above-stated history and physical exam secondary to alcohol intoxication.  Patient denies any SI or HI.  Patient was belligerent on arrival to the emergency department and as such required chemical sedation.  Patient was involuntarily committed as he posed a risk to himself and others secondary to his intoxication.  Patient is now cooperative without signs of intoxication and requesting  discharge. ____________________________________________  FINAL CLINICAL IMPRESSION(S) / ED DIAGNOSES  Final diagnoses:  Alcoholic intoxication with complication (HCC)     MEDICATIONS GIVEN DURING THIS VISIT:  Medications  LORazepam (ATIVAN) 2 MG/ML injection (has no administration in time range)  nicotine (NICODERM CQ - dosed in mg/24 hours) 21 mg/24hr patch (has no administration in time range)  LORazepam (ATIVAN) injection 2 mg (2 mg Intramuscular Given 06/19/18 0149)  diphenhydrAMINE (BENADRYL) injection 50 mg (50 mg Intravenous Given 06/19/18 0147)     ED Discharge Orders    None       Note:  This document was prepared using Dragon voice recognition software and may include unintentional dictation errors.    Darci CurrentBrown, [place] N, MD 06/19/18 [phone]    Darci CurrentBrown, [place] N, MD 06/19/18 [phone]

## 2018-06-19 NOTE — ED Notes (Signed)
Patient changed into scrubs; continues with being demanding; asking to leave; frequently asking to speak with ER-MD.

## 2018-06-19 NOTE — ED Notes (Signed)
Telephone call was received from Patient's mother to state that, "My son has had this problem for a long time; he needs to go to [facility] for long term; he can't come back here(home); we are afraid of him; he said that; he wants to cut up people; we live with my sister; and she has put him out; we are really afraid of him; I can't come to get him."

## 2018-07-02 ENCOUNTER — Emergency Department
Admission: EM | Admit: 2018-07-02 | Discharge: 2018-07-03 | Disposition: A | Discharge status: Home / Self Care | Payer: Self-pay | Attending: Emergency Medicine | Admitting: Emergency Medicine

## 2018-07-02 ENCOUNTER — Other Ambulatory Visit: Payer: Self-pay

## 2018-07-02 DIAGNOSIS — Y907 Blood alcohol level of 200-239 mg/100 ml: Secondary | ICD-10-CM | POA: Insufficient documentation

## 2018-07-02 DIAGNOSIS — Z79899 Other long term (current) drug therapy: Secondary | ICD-10-CM | POA: Insufficient documentation

## 2018-07-02 DIAGNOSIS — F10920 Alcohol use, unspecified with intoxication, uncomplicated: Secondary | ICD-10-CM | POA: Insufficient documentation

## 2018-07-02 DIAGNOSIS — F17210 Nicotine dependence, cigarettes, uncomplicated: Secondary | ICD-10-CM | POA: Insufficient documentation

## 2018-07-02 DIAGNOSIS — F319 Bipolar disorder, unspecified: Secondary | ICD-10-CM | POA: Insufficient documentation

## 2018-07-02 LAB — COMPREHENSIVE METABOLIC PANEL
ALBUMIN: 4.4 g/dL (ref 3.5–5.0)
ALK PHOS: 62 U/L (ref 38–126)
ALT: 93 U/L — ABNORMAL HIGH (ref 0–44)
AST: 67 U/L — ABNORMAL HIGH (ref 15–41)
Anion gap: 10 (ref 5–15)
BUN: 11 mg/dL (ref 6–20)
CO2: 22 mmol/L (ref 22–32)
Calcium: 8.6 mg/dL — ABNORMAL LOW (ref 8.9–10.3)
Chloride: 101 mmol/L (ref 98–111)
Creatinine, Ser: 0.56 mg/dL — ABNORMAL LOW (ref 0.61–1.24)
GFR calc Af Amer: >60 mL/min (ref >60)
GFR calc non Af Amer: >60 mL/min (ref >60)
GLUCOSE: 132 mg/dL — AB (ref 70–99)
POTASSIUM: 3.3 mmol/L — AB (ref 3.5–5.1)
SODIUM: 133 mmol/L — AB (ref 135–145)
Total Bilirubin: 0.5 mg/dL (ref 0.3–1.2)
Total Protein: 8.1 g/dL (ref 6.5–8.1)

## 2018-07-02 LAB — CBC
HEMATOCRIT: 46.3 % (ref 40.0–52.0)
HEMOGLOBIN: 16.2 g/dL (ref 13.0–18.0)
MCH: 33.1 pg (ref 26.0–34.0)
MCHC: 35.1 g/dL (ref 32.0–36.0)
MCV: 94.4 fL (ref 80.0–100.0)
Platelets: 251 10*3/uL (ref 150–440)
RBC: 4.9 MIL/uL (ref 4.40–5.90)
RDW: 15 % — ABNORMAL HIGH (ref 11.5–14.5)
WBC: 9.5 10*3/uL (ref 3.8–10.6)

## 2018-07-02 LAB — ACETAMINOPHEN LEVEL

## 2018-07-02 LAB — SALICYLATE LEVEL: Salicylate Lvl: <7 mg/dL (ref 2.8–30.0)

## 2018-07-02 LAB — ETHANOL: Alcohol, Ethyl (B): 220 mg/dL — ABNORMAL HIGH (ref <10)

## 2018-07-02 NOTE — ED Triage Notes (Signed)
Patient reports having a lot of anxiety and violent feelings towards some one at home.  Also reports feeling paranoid.

## 2018-07-02 NOTE — ED Notes (Signed)
Pt. Transferred from Triage to room 20 after dressing out and screening for contraband. Report to include Situation, Background, Assessment and [facility]. Pt. Oriented to Quad including Q15 minute rounds as well as Psychologist, counsellingover and Officer for their protection. Patient is alert and oriented, warm and dry in no acute distress. Patient denies SI, and AVH. Pt. States he wants to hurt an individual but declines to be specific. Pt. Encouraged to let me know if needs arise.

## 2018-07-02 NOTE — ED Notes (Signed)
Dressed patient out in triage and placed belongings in a bag. Personals: Flip flops, Black T-shirt, Red shorts, Black cap. Valuables: Silver Earrings (1) and Silver ring (1). LJA

## 2018-07-03 NOTE — ED Provider Notes (Signed)
[facility] Emergency Department Provider Note  ____________________________________________   First MD Initiated Contact with Patient 07/03/18 0011     (approximate)  I have reviewed the triage vital signs and the nursing notes.   HISTORY  Chief Complaint Psychiatric Evaluation    HPI Nathan [name] is a 32 y.o. male with below list of chronic medical conditions including alcohol abuse presents to the emergency department involuntarily stating that he had thoughts of hurting people earlier.  Patient denies any specific person that he wanted to hurt.  Patient states that he believes this was secondary to the fact that he has been drinking tonight.  Patient denies any suicidal or homicidal ideations at present.  Patient denies any visual or auditory hallucinations.  Past Medical History:  Diagnosis Date  . Bipolar 1 disorder (HCC)   . Chronic headache   . 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 Horseshoe kidney     Patient Active Problem List   Diagnosis Date Noted  . Bipolar 1 disorder, depressed (HCC) 01/31/2018  . Bipolar 1 disorder, mixed, severe (HCC) 04/22/2016  . Alcohol use disorder, mild, abuse 04/22/2016  . Suicidal ideation 04/22/2016  . Tobacco use disorder 04/22/2016  . 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 TONSILLECTOMY      Prior to Admission medications   Medication Sig Start Date End Date Taking? Authorizing Provider  diazepam (VALIUM) 5 MG tablet Take 2.5 mg by mouth every 12 (twelve) hours as needed for anxiety.     [provider]  diphenhydrAMINE (BENADRYL) 25 mg capsule Take 2 capsules (50 mg total) by mouth every 6 (six) hours as needed. 03/12/17   Sharman Cheek, MD  ketorolac (TORADOL) 10 MG tablet Take 1 tablet (10 mg total) by mouth every 6 (six) hours as needed for moderate pain. 03/12/17   Sharman Cheek, MD    Ledipasvir-Sofosbuvir (HARVONI) 90-400 MG TABS Take by mouth 1 day or 1 dose.    [provider]  QUEtiapine (SEROQUEL) 50 MG tablet Take 1 tablet (50 mg total) by mouth at bedtime. 01/31/18   Clapacs, Jackquline Denmark, MD  traZODone (DESYREL) 50 MG tablet Take 25 mg by mouth at bedtime.    [provider]    Allergies Geodon [ziprasidone hcl]  Family History  Problem Relation Age of Onset  . Hypertension Mother   . Diabetes Mother   . Hypertension Father   . Heart failure Father   . Diabetes Father     Social History Social History   Tobacco Use  . Smoking status: Current Every Day Smoker    Packs/day: 1.50    Types: Cigarettes  . Smokeless tobacco: Current User    Types: Chew  Substance Use Topics  . Alcohol use: Yes  . Drug use: No    Review of Systems Constitutional: No fever/chills Eyes: No visual changes. ENT: No sore throat. Cardiovascular: Denies chest pain. Respiratory: Denies shortness of breath. Gastrointestinal: No abdominal pain.  No nausea, no vomiting.  No diarrhea.  No constipation. Genitourinary: Negative for dysuria. Musculoskeletal: Negative for neck pain.  Negative for back pain. Integumentary: Negative for rash. Neurological: Negative for headaches, focal weakness or numbness. Psychiatric:Positive for alcoholic intoxication  ____________________________________________   PHYSICAL EXAM:  VITAL SIGNS: ED Triage Vitals [07/02/18 2301]  Enc Vitals Group     BP (!) 135/102     Pulse Rate 94  Resp 18     Temp 98.6 F (37 C)     Temp Source Oral     SpO2 97 %     Weight      Height      Head Circumference      Peak Flow      Pain Score 7     Pain Loc      Pain Edu?      Excl. in GC?     Constitutional: Alert and oriented. Well appearing and in no acute distress. Eyes: Conjunctivae are normal. PERRL. EOMI. Head: Atraumatic. Mouth/Throat: Mucous membranes are moist. Oropharynx non-erythematous. Neck: No stridor.    Cardiovascular: Normal rate, regular rhythm. Good peripheral circulation. Grossly normal heart sounds. Respiratory: Normal respiratory effort.  No retractions. Lungs CTAB. Gastrointestinal: Soft and nontender. No distention.  Musculoskeletal: No lower extremity tenderness nor edema. No gross deformities of extremities. Neurologic:  Normal speech and language. No gross focal neurologic deficits are appreciated.  Skin:  Skin is warm, dry and intact. No rash noted. Psychiatric: Mood and affect are normal. Speech and behavior are normal.  ____________________________________________   LABS (all labs ordered are listed, but only abnormal results are displayed)  Labs Reviewed  COMPREHENSIVE METABOLIC PANEL - Abnormal; Notable for the following components:      Result Value   Sodium 133 (*)    Potassium 3.3 (*)    Glucose, Bld 132 (*)    Creatinine, Ser 0.56 (*)    Calcium 8.6 (*)    AST 67 (*)    ALT 93 (*)    All other components within normal limits  ETHANOL - Abnormal; Notable for the following components:   Alcohol, Ethyl (B) 220 (*)    All other components within normal limits  ACETAMINOPHEN LEVEL - Abnormal; Notable for the following components:   Acetaminophen (Tylenol), Serum <10 (*)    All other components within normal limits  CBC - Abnormal; Notable for the following components:   RDW 15.0 (*)    All other components within normal limits  SALICYLATE LEVEL  URINE DRUG SCREEN, QUALITATIVE ([facility] ONLY)    Procedures   ____________________________________________   INITIAL IMPRESSION / ASSESSMENT AND PLAN / ED COURSE  As part of my medical decision making, I reviewed the following data within the electronic MEDICAL RECORD NUMBER   32 year old male presenting with above-stated history and physical exam consistent with alcohol intoxication.  Patient is now alert and oriented able to ambulate without any difficulty and requesting discharge.  Patient denies any homicidal or  suicidal ideation.  As such patient does not meet criteria for involuntary commitment.  ___________________________________  FINAL CLINICAL IMPRESSION(S) / ED DIAGNOSES  Final diagnoses:  Acute alcoholic intoxication without complication (HCC)     MEDICATIONS GIVEN DURING THIS VISIT:  Medications - No data to display   ED Discharge Orders    None       Note:  This document was prepared using Dragon voice recognition software and may include unintentional dictation errors.    Darci CurrentBrown, [place] N, MD 07/03/18 [phone]

## 2018-07-03 NOTE — ED Notes (Signed)
Hourly rounding reveals patient in room. No complaints, stable, in no acute distress. Q15 minute rounds and monitoring via Rover and Officer to continue.   

## 2018-07-18 ENCOUNTER — Other Ambulatory Visit: Payer: Self-pay

## 2018-07-18 ENCOUNTER — Emergency Department
Admission: EM | Admit: 2018-07-18 | Discharge: 2018-07-18 | Disposition: A | Discharge status: Home / Self Care | Payer: Self-pay | Attending: Emergency Medicine | Admitting: Emergency Medicine

## 2018-07-18 ENCOUNTER — Encounter: Payer: Self-pay | Admitting: Emergency Medicine

## 2018-07-18 ENCOUNTER — Emergency Department
Admission: EM | Admit: 2018-07-18 | Discharge: 2018-07-19 | Disposition: A | Discharge status: Home / Self Care | Payer: Self-pay | Attending: Emergency Medicine | Admitting: Emergency Medicine

## 2018-07-18 DIAGNOSIS — Y9389 Activity, other specified: Secondary | ICD-10-CM | POA: Insufficient documentation

## 2018-07-18 DIAGNOSIS — F319 Bipolar disorder, unspecified: Secondary | ICD-10-CM | POA: Insufficient documentation

## 2018-07-18 DIAGNOSIS — R45851 Suicidal ideations: Secondary | ICD-10-CM | POA: Insufficient documentation

## 2018-07-18 DIAGNOSIS — F19980 Other psychoactive substance use, unspecified with psychoactive substance-induced anxiety disorder: Secondary | ICD-10-CM | POA: Diagnosis present

## 2018-07-18 DIAGNOSIS — R4689 Other symptoms and signs involving appearance and behavior: Secondary | ICD-10-CM

## 2018-07-18 DIAGNOSIS — Y929 Unspecified place or not applicable: Secondary | ICD-10-CM | POA: Insufficient documentation

## 2018-07-18 DIAGNOSIS — Z79899 Other long term (current) drug therapy: Secondary | ICD-10-CM | POA: Insufficient documentation

## 2018-07-18 DIAGNOSIS — F1010 Alcohol abuse, uncomplicated: Secondary | ICD-10-CM

## 2018-07-18 DIAGNOSIS — F17210 Nicotine dependence, cigarettes, uncomplicated: Secondary | ICD-10-CM | POA: Insufficient documentation

## 2018-07-18 DIAGNOSIS — F1014 Alcohol abuse with alcohol-induced mood disorder: Secondary | ICD-10-CM | POA: Insufficient documentation

## 2018-07-18 DIAGNOSIS — F10120 Alcohol abuse with intoxication, uncomplicated: Secondary | ICD-10-CM | POA: Insufficient documentation

## 2018-07-18 DIAGNOSIS — S61211A Laceration without foreign body of left index finger without damage to nail, initial encounter: Secondary | ICD-10-CM | POA: Insufficient documentation

## 2018-07-18 DIAGNOSIS — F10929 Alcohol use, unspecified with intoxication, unspecified: Secondary | ICD-10-CM

## 2018-07-18 DIAGNOSIS — F1994 Other psychoactive substance use, unspecified with psychoactive substance-induced mood disorder: Secondary | ICD-10-CM

## 2018-07-18 DIAGNOSIS — Z915 Personal history of self-harm: Secondary | ICD-10-CM | POA: Insufficient documentation

## 2018-07-18 DIAGNOSIS — Y33XXXA Other specified events, undetermined intent, initial encounter: Secondary | ICD-10-CM | POA: Insufficient documentation

## 2018-07-18 DIAGNOSIS — Z046 Encounter for general psychiatric examination, requested by authority: Secondary | ICD-10-CM | POA: Insufficient documentation

## 2018-07-18 DIAGNOSIS — Y998 Other external cause status: Secondary | ICD-10-CM | POA: Insufficient documentation

## 2018-07-18 LAB — COMPREHENSIVE METABOLIC PANEL
ALT: 254 U/L — ABNORMAL HIGH (ref 0–44)
AST: 173 U/L — AB (ref 15–41)
Albumin: 4.5 g/dL (ref 3.5–5.0)
Alkaline Phosphatase: 82 U/L (ref 38–126)
Anion gap: 9 (ref 5–15)
BILIRUBIN TOTAL: 0.3 mg/dL (ref 0.3–1.2)
BUN: 11 mg/dL (ref 6–20)
CO2: 24 mmol/L (ref 22–32)
Calcium: 8.8 mg/dL — ABNORMAL LOW (ref 8.9–10.3)
Chloride: 109 mmol/L (ref 98–111)
Creatinine, Ser: 0.64 mg/dL (ref 0.61–1.24)
GFR calc Af Amer: >60 mL/min (ref >60)
GFR calc non Af Amer: >60 mL/min (ref >60)
GLUCOSE: 116 mg/dL — AB (ref 70–99)
Potassium: 4.1 mmol/L (ref 3.5–5.1)
SODIUM: 142 mmol/L (ref 135–145)
TOTAL PROTEIN: 8.1 g/dL (ref 6.5–8.1)

## 2018-07-18 LAB — URINE DRUG SCREEN, QUALITATIVE (ARMC ONLY)
AMPHETAMINES, UR SCREEN: NOT DETECTED
Barbiturates, Ur Screen: NOT DETECTED
Benzodiazepine, Ur Scrn: POSITIVE — AB
COCAINE METABOLITE, UR ~~LOC~~: NOT DETECTED
Cannabinoid 50 Ng, Ur ~~LOC~~: POSITIVE — AB
MDMA (ECSTASY) UR SCREEN: NOT DETECTED
METHADONE SCREEN, URINE: NOT DETECTED
OPIATE, UR SCREEN: NOT DETECTED
PHENCYCLIDINE (PCP) UR S: NOT DETECTED
Tricyclic, Ur Screen: NOT DETECTED

## 2018-07-18 LAB — CBC
HEMATOCRIT: 46.5 % (ref 40.0–52.0)
HEMOGLOBIN: 16.1 g/dL (ref 13.0–18.0)
MCH: 32.1 pg (ref 26.0–34.0)
MCHC: 34.6 g/dL (ref 32.0–36.0)
MCV: 92.7 fL (ref 80.0–100.0)
Platelets: 294 10*3/uL (ref 150–440)
RBC: 5.02 MIL/uL (ref 4.40–5.90)
RDW: 14.9 % — AB (ref 11.5–14.5)
WBC: 11.7 10*3/uL — AB (ref 3.8–10.6)

## 2018-07-18 LAB — ETHANOL: Alcohol, Ethyl (B): 282 mg/dL — ABNORMAL HIGH (ref <10)

## 2018-07-18 LAB — ACETAMINOPHEN LEVEL

## 2018-07-18 LAB — SALICYLATE LEVEL: Salicylate Lvl: 23.7 mg/dL (ref 2.8–30.0)

## 2018-07-18 MED ORDER — LORAZEPAM 2 MG PO TABS
2.0000 mg | ORAL_TABLET | Freq: Once | ORAL | Status: AC
  Administered 2018-07-18: 2 mg via ORAL
  Filled 2018-07-18: qty 1

## 2018-07-18 MED ORDER — DIAZEPAM 5 MG PO TABS
5.0000 mg | ORAL_TABLET | Freq: Once | ORAL | Status: AC
  Administered 2018-07-18: 5 mg via ORAL
  Filled 2018-07-18: qty 1

## 2018-07-18 MED ORDER — HYDROCODONE-ACETAMINOPHEN 5-325 MG PO TABS
1.0000 | ORAL_TABLET | Freq: Once | ORAL | Status: AC
  Administered 2018-07-18: 1 via ORAL
  Filled 2018-07-18: qty 1

## 2018-07-18 MED ORDER — ASPIRIN 81 MG PO CHEW
324.0000 mg | CHEWABLE_TABLET | Freq: Once | ORAL | Status: AC
  Administered 2018-07-18: 324 mg via ORAL
  Filled 2018-07-18: qty 4
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
## 2018-07-18 NOTE — Discharge Instructions (Signed)
You were seen in the emergency department for alcohol intoxication.  Please seek help from the recommended resources for assistance with your alcohol dependence.  If you have any thoughts of hurting herself or others, please call 911 or return to the emergency department.  Please avoid drug and alcohol use.  Never drive a vehicle or operate machinery while intoxicated.  

## 2018-07-18 NOTE — ED Provider Notes (Signed)
[facility] Emergency Department Provider Note  ____________________________________________   First MD Initiated Contact with Patient 07/18/18 [phone]     (approximate)  I have reviewed the triage vital signs and the nursing notes.   HISTORY  Chief Complaint No chief complaint on file.  Level 5 caveat:  history/ROS limited by acute intoxication  [name] is a 32 y.o. male who is well-known to the emergency department for psychiatric and intoxication visits.  He comes in tonight after reportedly threatening to hurt people or kill himself earlier.  He admits to drinking alcohol tonight.  He says he does not want to hurt anybody but he knows he could blow up so he feels safer here.  He says he has a headache like he often does and would like some aspirin and he feels like he will be safer and more calm if he gets some Ativan.  This is typical of his presentation.  He does come under involuntary commitment paperwork by law enforcement for his vague threats but he agrees to stay calm in the ED.  He denies chest pain, shortness of breath, nausea, vomiting, and abdominal pain.  Past Medical History:  Diagnosis Date  . Bipolar 1 disorder (HCC)   . Chronic headache   . 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 Horseshoe kidney     Patient Active Problem List   Diagnosis Date Noted  . Bipolar 1 disorder, depressed (HCC) 01/31/2018  . Bipolar 1 disorder, mixed, severe (HCC) 04/22/2016  . Alcohol use disorder, mild, abuse 04/22/2016  . Suicidal ideation 04/22/2016  . Tobacco use disorder 04/22/2016  . 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 TONSILLECTOMY      Prior to Admission medications   Medication Sig Start Date End Date Taking? Authorizing Provider  diazepam (VALIUM) 5 MG tablet Take 2.5 mg by mouth every 12 (twelve) hours as needed for anxiety.     [provider]  diphenhydrAMINE (BENADRYL) 25 mg capsule Take 2 capsules (50 mg total) by mouth every 6 (six) hours as needed. 03/12/17   Sharman CheekStafford, Phillip, MD  ketorolac (TORADOL) 10 MG tablet Take 1 tablet (10 mg total) by mouth every 6 (six) hours as needed for moderate pain. 03/12/17   Sharman CheekStafford, Phillip, MD  Ledipasvir-Sofosbuvir (HARVONI) 90-400 MG TABS Take by mouth 1 day or 1 dose.    [provider]  QUEtiapine (SEROQUEL) 50 MG tablet Take 1 tablet (50 mg total) by mouth at bedtime. 01/31/18   Clapacs, Jackquline DenmarkJohn T, MD  traZODone (DESYREL) 50 MG tablet Take 25 mg by mouth at bedtime.    [provider]    Allergies Geodon [ziprasidone hcl]  Family History  Problem Relation Age of Onset  . Hypertension Mother   . Diabetes Mother   . Hypertension Father   . Heart failure Father   . Diabetes Father     Social History Social History   Tobacco Use  . Smoking status: Current Every Day Smoker    Packs/day: 1.50    Types: Cigarettes  . Smokeless tobacco: Current User    Types: Chew  Substance Use Topics  . Alcohol use: Yes  . Drug use: No    Review of Systems Level 5 caveat:  history/ROS limited by acute intoxication  Constitutional: No fever/chills Eyes: No visual changes. ENT: No sore throat. Cardiovascular: Denies chest pain. Respiratory: Denies shortness of breath.  Gastrointestinal: No abdominal pain.  No nausea, no vomiting.  No diarrhea.  No constipation. Genitourinary: Negative for dysuria. Musculoskeletal: Negative for neck pain.  Negative for back pain. Integumentary: Negative for rash. Neurological: Negative for headaches, focal weakness or numbness. Psych: The patient is reporting some anger management issues and thoughts of hurting himself and others and intoxication  ____________________________________________   PHYSICAL EXAM:  VITAL SIGNS: ED Triage Vitals [07/18/18 0058]  Enc Vitals Group     BP 121/89     Pulse Rate 88     Resp 17       Temp 98.4 F (36.9 C)     Temp Source Oral     SpO2 97 %     Weight 64.4 kg (142 lb)     Height 1.753 m (5\' 9" )     Head Circumference      Peak Flow      Pain Score      Pain Loc      Pain Edu?      Excl. in GC?     Constitutional: Alert and oriented.  Appears intoxicated but consistent with prior visits to the ED Eyes: Conjunctivae are normal.  Head: Atraumatic. Nose: No congestion/rhinnorhea. Mouth/Throat: Mucous membranes are moist. Neck: No stridor.  No meningeal signs.   Cardiovascular: Normal rate, regular rhythm. Good peripheral circulation. Grossly normal heart sounds. Respiratory: Normal respiratory effort.  No retractions. Lungs CTAB. Gastrointestinal: Soft and nontender. No distention.  Musculoskeletal: No lower extremity tenderness nor edema. No gross deformities of extremities. Neurologic:  Normal speech and language. No gross focal neurologic deficits are appreciated.  Skin:  Skin is warm, dry and intact. No rash noted. Psychiatric: Mood and affect are normal for him, which is to say somewhat labile and occasionally verbally threatening but generally cooperative at this time   ____________________________________________   LABS (all labs ordered are listed, but only abnormal results are displayed)  Labs Reviewed - No data to display ____________________________________________  EKG  None - EKG not ordered by ED physician ____________________________________________  RADIOLOGY   ED MD interpretation: No indication for imaging  Official radiology report(s): No results found.  ____________________________________________   PROCEDURES  Critical Care performed: No   Procedure(s) performed:   Procedures   ____________________________________________   INITIAL IMPRESSION / ASSESSMENT AND PLAN / ED COURSE  As part of my medical decision making, I reviewed the following data within the electronic MEDICAL RECORD NUMBER Nursing notes reviewed  and incorporated, Old chart reviewed and Notes from prior ED visits    Differential diagnosis includes, but is not limited to, substance-induced mood disorder, alcohol intoxication, anger management issues, true suicidal ideation.  The patient has had many visits similar to this in the past.  He agrees to be calm if he has something for his headache (aspirin) and some Ativan which I think is appropriate.  I will reassess in the morning but anticipate revoking his involuntary commitment at that time and discharged for outpatient follow-up.  Clinical Course as of Jul 19 543  Tue Jul 18, 2018  0543 Patient is clinically sober.  He is ambulatory without difficulty.  He denies SI/HI and says he is ready to go.  This is consistent with his prior presentations.  I do not feel he meets involuntary commitment criteria at this time.  I rescinded his IVC and he will be discharged for outpatient follow-up.  I gave my usual and customary return precautions.   [CF]    Clinical Course User  Index [CF] Loleta Rose, MD    ____________________________________________  FINAL CLINICAL IMPRESSION(S) / ED DIAGNOSES  Final diagnoses:  Alcoholic intoxication with complication (HCC)  Substance induced mood disorder (HCC)     MEDICATIONS GIVEN DURING THIS VISIT:  Medications  aspirin chewable tablet 324 mg (324 mg Oral Given 07/18/18 0130)  LORazepam (ATIVAN) tablet 2 mg (2 mg Oral Given 07/18/18 0130)     ED Discharge Orders    None       Note:  This document was prepared using Dragon voice recognition software and may include unintentional dictation errors.    Loleta Rose, MD 07/18/18 [phone]

## 2018-07-18 NOTE — ED Notes (Signed)

## 2018-07-18 NOTE — ED Notes (Signed)
Pt denies SI/HI/AVH. Pt given discharge instructions. Pt states understanding. Pt states receipt of all belongings.   

## 2018-07-18 NOTE — ED Notes (Signed)
Pt belongings include one silver colored earring, 1 black t-shirt, 1 pr of sandals, 1 pr of red/black shorts, 1 pr black boxer briefs. Pt will keep prescription eyeglasses on his person.

## 2018-07-18 NOTE — ED Notes (Signed)
Patient continues to remove bandage, this writer bandaged patient's finger again.

## 2018-07-18 NOTE — ED Notes (Signed)
Patient threatening to become violent if not given aspirin and ativan.

## 2018-07-18 NOTE — ED Notes (Signed)
Officer arrived with IVC paper work, Set designertransport vehicle with pt is en route not currently here

## 2018-07-18 NOTE — ED Notes (Signed)
Pt dressed out in burgundy scrubs with 3 sherriff officers and Teacher, early years/preButch RN present.  Labs drawn and sent to lab.  Belongings include flip flops, shorts, underwear, tshirt, 1- silver colored earring.  Belongings labelled and placed at nurses station.  Pt ambulated to room 20 hall. Pt is argumentative in triage and is making verbal threats about him "flipping out" and how he can hurt people.  Officers at pts side

## 2018-07-18 NOTE — ED Notes (Signed)
Pt. Alert and oriented, warm and dry, in no distress. Pt. Denies HI, and AVH. Pt states having SI but no plan. Patient has cut on left index finger at first joint that is actively bleeding. This writer bandaged cut with gauze and taped up. Patient does not want Clinical research associatewriter or anyone else to clean his blood, patient states, "It is toxic, I have Hep C." This writer explained that is was ok we would clean it with special cleanser.  Pt. Encouraged to let nursing staff know of any concerns or needs.'

## 2018-07-19 DIAGNOSIS — F19980 Other psychoactive substance use, unspecified with psychoactive substance-induced anxiety disorder: Secondary | ICD-10-CM

## 2018-07-19 LAB — ACETAMINOPHEN LEVEL

## 2018-07-19 LAB — SALICYLATE LEVEL: Salicylate Lvl: 18.2 mg/dL (ref 2.8–30.0)

## 2018-07-19 MED ORDER — ACETAMINOPHEN 325 MG PO TABS
650.0000 mg | ORAL_TABLET | Freq: Four times a day (QID) | ORAL | Status: DC | PRN
  Administered 2018-07-19: 650 mg via ORAL
  Filled 2018-07-19: qty 2

## 2018-07-19 NOTE — ED Notes (Signed)
Patient refused shower, he's going home today.

## 2018-07-19 NOTE — ED Notes (Signed)
Hourly rounding reveals patient sleeping in room. No complaints, stable, in no acute distress. Q15 minute rounds and monitoring via Security Cameras to continue. 

## 2018-07-19 NOTE — ED Notes (Addendum)
Patient is calling His mom at this time, he is calm, no behavioral issues. Patient ate 100% of lunch and beverage.

## 2018-07-19 NOTE — ED Notes (Signed)
Pt. Transferred to BHU from ED to room after screening for contraband. Report to include Situation, Background, Assessment and Recommendations from Kim Summers RN. Pt. Oriented to unit including Q15 minute rounds as well as the security cameras for their protection. Patient is alert and oriented, warm and dry in no acute distress. Patient denies SI, HI, and AVH. Pt. Encouraged to let me know if needs arise. 

## 2018-07-19 NOTE — BH Assessment (Signed)
Pt intoxicated, asleep. Unable to assess at this time

## 2018-07-19 NOTE — Consult Note (Signed)
[facility] Face-to-Face Psychiatry Consult   Reason for Consult: Consult for this 32 year old man with alcohol problems and behavior problems brought in last night after calling 911 himself. Referring Physician: Rip Harbour Patient Identification: Nathan [name] MRN:  [id number] Principal Diagnosis: Substance or medication-induced anxiety disorder [facility]) Diagnosis:   Patient Active Problem List   Diagnosis Date Noted  . Bipolar 1 disorder, depressed ([place]) [F31.9] 01/31/2018  . Bipolar 1 disorder, mixed, severe ([place]) [F31.63] 04/22/2016  . Alcohol use disorder, mild, abuse [F10.10] 04/22/2016  . Suicidal ideation [R45.851] 04/22/2016  . Tobacco use disorder [F17.200] 04/22/2016  . Substance or medication-induced anxiety disorder [facility]) [F19.980] 04/21/2016    Total Time spent with patient: 1 hour  Subjective:   Nathan [name] is a 32 y.o. male patient admitted with "I do not know why they bother to bring me here".  HPI: Patient interviewed chart reviewed.  Patient known from prior encounters.  Patient says he was drinking yesterday and that his temper got bad.  He says there is a woman living at the house where he stays who is freeloading off his family and she gets on his nerves.  He was feeling irritable towards her and decided that rather than going off he would ask the police to bring him in or give him some help.  Patient was intoxicated when he showed up last night but has sobered up.  He says he is drinking daily several large beers at least per day.  Denies that he is using any other drugs.  He says overall he feels like his mood is fine.  Denies suicidal or homicidal thought.  Denies any wish to harm this person physically.  Denies any psychotic symptoms.  Currently physically stable no sign of acute withdrawal or delirium.  Not getting any outpatient mental health treatment right now.  Social history: Living with his mother.  Not working.  Back to drinking again.  Not very compliant  with recommended outpatient treatment.  Medical history: No major medical issues  Substance abuse history: Patient has a history of alcohol abuse which is the main cause of most of his mood instability.  No history of DTs that I am aware of.  Past Psychiatric History: Patient has had multiple visits to the emergency room and has had admissions in the past.  Typically once he sobers up he comes back to his baseline.  Psychiatric medicines have been of limited value.  No actual history of suicide attempts or real violence in the past.  Questionable diagnosis of bipolar disorder but may mostly be from his alcohol abuse  Risk to Self: Suicidal Ideation: No Suicidal Intent: No Is patient at risk for suicide?: No Suicidal Plan?: No Access to Means: No What has been your use of drugs/alcohol within the last 12 months?: Alcohol and Cannabis How many times?: 2 Other Self Harm Risks: Active Addiction Triggers for Past Attempts: Spouse contact, Other personal contacts Intentional Self Injurious Behavior: None Risk to Others: Homicidal Ideation: No Thoughts of Harm to Others: No Current Homicidal Intent: No Current Homicidal Plan: No Access to Homicidal Means: No Identified Victim: Reports of none History of harm to others?: No Assessment of Violence: None Noted Violent Behavior Description: Active Addiction Does patient have access to weapons?: No Criminal Charges Pending?: No Does patient have a court date: No Prior Inpatient Therapy: Prior Inpatient Therapy: Yes Prior Therapy Dates: 10/2011, 05/2011, 03/2011 & 07/2010 Prior Therapy Facilty/Provider(s): [facility] Reason for Treatment: Depression and Substance Use Prior Outpatient  Therapy: Prior Outpatient Therapy: No Does patient have Intensive In-House Services?  : No Does patient have [facility] services? : No Does patient have P4CC services?: No  Past Medical History:  Past Medical History:  Diagnosis Date  . Bipolar 1 disorder ([place])    . Chronic headache   . Hep C w/o coma, chronic ([place])   . Horseshoe kidney     Past Surgical History:  Procedure Laterality Date  . TONSILLECTOMY     Family History:  Family History  Problem Relation Age of Onset  . Hypertension Mother   . Diabetes Mother   . Hypertension Father   . Heart failure Father   . Diabetes Father    Family Psychiatric  History: Does not know of any Social History:  Social History   Substance and Sexual Activity  Alcohol Use Yes     Social History   Substance and Sexual Activity  Drug Use No    Social History   Socioeconomic History  . Marital status: Single    Spouse name: Not on file  . Number of children: Not on file  . Years of education: Not on file  . Highest education level: Not on file  Occupational History  . Not on file  Social Needs  . Financial resource strain: Not on file  . Food insecurity:    Worry: Not on file    Inability: Not on file  . Transportation needs:    Medical: Not on file    Non-medical: Not on file  Tobacco Use  . Smoking status: Current Every Day Smoker    Packs/day: 1.50    Types: Cigarettes  . Smokeless tobacco: Current User    Types: Chew  Substance and Sexual Activity  . Alcohol use: Yes  . Drug use: No  . Sexual activity: Yes  Lifestyle  . Physical activity:    Days per week: Not on file    Minutes per session: Not on file  . Stress: Not on file  Relationships  . Social connections:    Talks on phone: Not on file    Gets together: Not on file    Attends religious service: Not on file    Active member of club or organization: Not on file    Attends meetings of clubs or organizations: Not on file    Relationship status: Not on file  Other Topics Concern  . Not on file  Social History Narrative  . Not on file   Additional Social History:    Allergies:   Allergies  Allergen Reactions  . Geodon [Ziprasidone Hcl] Other (See Comments)    Seizures    Labs:  Results for orders  placed or performed during the hospital encounter of 07/18/18 (from the past 48 hour(s))  Comprehensive metabolic panel     Status: Abnormal   Collection Time: 07/18/18  8:26 PM  Result Value Ref Range   Sodium 142 135 - 145 mmol/L   Potassium 4.1 3.5 - 5.1 mmol/L   Chloride 109 98 - 111 mmol/L   CO2 24 22 - 32 mmol/L   Glucose, Bld 116 (H) 70 - 99 mg/dL   BUN 11 6 - 20 mg/dL   Creatinine, Ser 0.64 0.61 - 1.24 mg/dL   Calcium 8.8 (L) 8.9 - 10.3 mg/dL   Total Protein 8.1 6.5 - 8.1 g/dL   Albumin 4.5 3.5 - 5.0 g/dL   AST 173 (H) 15 - 41 U/L   ALT 254 (H) 0 -  44 U/L   Alkaline Phosphatase 82 38 - 126 U/L   Total Bilirubin 0.3 0.3 - 1.2 mg/dL   GFR calc non Af Amer >60 >60 mL/min   GFR calc Af Amer >60 >60 mL/min    Comment: (NOTE) The eGFR has been calculated using the CKD EPI equation. This calculation has not been validated in all clinical situations. eGFR's persistently <60 mL/min signify possible Chronic Kidney Disease.    Anion gap 9 5 - 15    Comment: Performed at [facility], [address] [place], [place] [address]  Ethanol     Status: Abnormal   Collection Time: 07/18/18  8:26 PM  Result Value Ref Range   Alcohol, Ethyl (B) 282 (H) <10 mg/dL    Comment: (NOTE) Lowest detectable limit for serum alcohol is 10 mg/dL. For medical purposes only. Performed at [facility], [address] [place], [place] [address]   Salicylate level     Status: None   Collection Time: 07/18/18  8:26 PM  Result Value Ref Range   Salicylate Lvl 01.0 2.8 - 30.0 mg/dL    Comment: Performed at [facility], [place], [place] [address]  Acetaminophen level     Status: Abnormal   Collection Time: 07/18/18  8:26 PM  Result Value Ref Range   Acetaminophen (Tylenol), Serum <10 (L) 10 - 30 ug/mL    Comment: (NOTE) Therapeutic concentrations vary significantly. A range of 10-30 ug/mL  may be an effective concentration for many patients. However,  some  are best treated at concentrations outside of this range. Acetaminophen concentrations >150 ug/mL at 4 hours after ingestion  and >50 ug/mL at 12 hours after ingestion are often associated with  toxic reactions. Performed at [facility], [address] [place], [place] [address]   cbc     Status: Abnormal   Collection Time: 07/18/18  8:26 PM  Result Value Ref Range   WBC 11.7 (H) 3.8 - 10.6 K/uL   RBC 5.02 4.40 - 5.90 MIL/uL   Hemoglobin 16.1 13.0 - 18.0 g/dL   HCT 46.5 40.0 - 52.0 %   MCV 92.7 80.0 - 100.0 fL   MCH 32.1 26.0 - 34.0 pg   MCHC 34.6 32.0 - 36.0 g/dL   RDW 14.9 (H) 11.5 - 14.5 %   Platelets 294 150 - 440 K/uL    Comment: Performed at [facility], [address] [place], [place] [address]  Urine Drug Screen, Qualitative     Status: Abnormal   Collection Time: 07/18/18  9:32 PM  Result Value Ref Range   Tricyclic, Ur Screen NONE DETECTED NONE DETECTED   Amphetamines, Ur Screen NONE DETECTED NONE DETECTED   MDMA (Ecstasy)Ur Screen NONE DETECTED NONE DETECTED   Cocaine Metabolite,Ur [place] NONE DETECTED NONE DETECTED   Opiate, Ur Screen NONE DETECTED NONE DETECTED   Phencyclidine (PCP) Ur S NONE DETECTED NONE DETECTED   Cannabinoid 50 Ng, Ur [place] POSITIVE (A) NONE DETECTED   Barbiturates, Ur Screen NONE DETECTED NONE DETECTED   Benzodiazepine, Ur Scrn POSITIVE (A) NONE DETECTED   Methadone Scn, Ur NONE DETECTED NONE DETECTED    Comment: (NOTE) Tricyclics + metabolites, urine    Cutoff 1000 ng/mL Amphetamines + metabolites, urine  Cutoff 1000 ng/mL MDMA (Ecstasy), urine              Cutoff 500 ng/mL Cocaine Metabolite, urine          Cutoff 300 ng/mL Opiate +  metabolites, urine        Cutoff 300 ng/mL Phencyclidine (PCP), urine         Cutoff 25 ng/mL Cannabinoid, urine                 Cutoff 50 ng/mL Barbiturates + metabolites, urine  Cutoff 200 ng/mL Benzodiazepine, urine              Cutoff 200 ng/mL Methadone, urine                    Cutoff 300 ng/mL The urine drug screen provides only a preliminary, unconfirmed analytical test result and should not be used for non-medical purposes. Clinical consideration and professional judgment should be applied to any positive drug screen result due to possible interfering substances. A more specific alternate chemical method must be used in order to obtain a confirmed analytical result. Gas chromatography / mass spectrometry (GC/MS) is the preferred confirmat ory method. Performed at [facility], [address] [place], [place] [address]   Salicylate level     Status: None   Collection Time: 07/19/18 12:35 AM  Result Value Ref Range   Salicylate Lvl 32.3 2.8 - 30.0 mg/dL    Comment: Performed at [facility], [address] [place], [place] [address]  Acetaminophen level     Status: Abnormal   Collection Time: 07/19/18 12:35 AM  Result Value Ref Range   Acetaminophen (Tylenol), Serum <10 (L) 10 - 30 ug/mL    Comment: (NOTE) Therapeutic concentrations vary significantly. A range of 10-30 ug/mL  may be an effective concentration for many patients. However, some  are best treated at concentrations outside of this range. Acetaminophen concentrations >150 ug/mL at 4 hours after ingestion  and >50 ug/mL at 12 hours after ingestion are often associated with  toxic reactions. Performed at [facility], [address] [place], [place] [address]     Current Facility-Administered Medications  Medication Dose Route Frequency Provider Last Rate Last Dose  . acetaminophen (TYLENOL) tablet 650 mg  650 mg Oral Q6H PRN Paulette Blanch, MD   650 mg at 07/19/18 2706   Current Outpatient Medications  Medication Sig Dispense Refill  . diazepam (VALIUM) 5 MG tablet Take 2.5 mg by mouth every 12 (twelve) hours as needed for anxiety.     . diphenhydrAMINE (BENADRYL) 25 mg capsule Take 2 capsules (50 mg total) by mouth every 6 (six) hours as needed. 60 capsule  0  . ketorolac (TORADOL) 10 MG tablet Take 1 tablet (10 mg total) by mouth every 6 (six) hours as needed for moderate pain. 12 tablet 0  . Ledipasvir-Sofosbuvir (HARVONI) 90-400 MG TABS Take by mouth 1 day or 1 dose.    Marland Kitchen QUEtiapine (SEROQUEL) 50 MG tablet Take 1 tablet (50 mg total) by mouth at bedtime. 30 tablet 1  . traZODone (DESYREL) 50 MG tablet Take 25 mg by mouth at bedtime.      Musculoskeletal: Strength & Muscle Tone: within normal limits Gait & Station: normal Patient leans: N/A  Psychiatric Specialty Exam: Physical Exam  Nursing note and vitals reviewed. Constitutional: He appears well-developed and well-nourished.  HENT:  Head: Normocephalic and atraumatic.  Eyes: Pupils are equal, round, and reactive to light. Conjunctivae are normal.  Neck: Normal range of motion.  Cardiovascular: Regular rhythm and normal heart sounds.  Respiratory: Effort normal. No respiratory distress.  GI: Soft.  Musculoskeletal: Normal range of motion.  Neurological: He is alert.  Skin:  Skin is warm and dry.  Psychiatric: He has a normal mood and affect. His speech is normal and behavior is normal. Judgment and thought content normal. Cognition and memory are normal.    Review of Systems  Psychiatric/Behavioral: Positive for substance abuse. Negative for depression, hallucinations, memory loss and suicidal ideas. The patient is not nervous/anxious and does not have insomnia.     Blood pressure 128/75, pulse 82, temperature 98.4 F (36.9 C), temperature source Oral, resp. rate 16, height 5' 8" (1.727 m), weight 56.7 kg (125 lb), SpO2 99 %.Body mass index is 19.01 kg/m.  General Appearance: Disheveled  Eye Contact:  Good  Speech:  Clear and Coherent  Volume:  Normal  Mood:  Euthymic  Affect:  Congruent  Thought Process:  Goal Directed  Orientation:  Full (Time, Place, and Person)  Thought Content:  Logical  Suicidal Thoughts:  No  Homicidal Thoughts:  No  Memory:  Immediate;    Fair Recent;   Fair Remote;   Fair  Judgement:  Fair  Insight:  Fair  Psychomotor Activity:  Normal  Concentration:  Concentration: Fair  Recall:  [employer] of Knowledge:  Fair  Language:  Fair  Akathisia:  No  Handed:  Right  AIMS (if indicated):     Assets:  Desire for Improvement Housing Physical Health  ADL's:  Intact  Cognition:  WNL  Sleep:        Treatment Plan Summary: Plan Patient has sobered up and is returned to his baseline mental state.  Not reporting any suicidal ideation.  Not homicidal.  Calm and appropriate in his interaction.  No sign that he meets commitment criteria.  Discontinue IVC.  Patient knows very well that he needs to stop drinking for his mental and physical health and knows how to get treatment.  Referred again to go to [place].  No prescriptions were written.  Case reviewed with ER doctor.  Disposition: No evidence of imminent risk to self or others at present.   Patient does not meet criteria for psychiatric inpatient admission. Supportive therapy provided about ongoing stressors. Discussed crisis plan, support from social network, calling 911, coming to the Emergency Department, and calling Suicide Hotline.  Alethia Berthold, MD 07/19/2018 2:19 PM

## 2018-07-19 NOTE — ED Notes (Signed)
Patient is alert and oriented, voices understanding of discharge orders, no signs of distress, all belongings given back to Patient, Patient signed back of discharge form.

## 2018-07-19 NOTE — ED Provider Notes (Signed)
-----------------------------------------   12:31 AM on 07/19/2018 -----------------------------------------  Have placed tele-psychiatry consult for patient under IVC with agitation and suicidal thoughts.  Will repeat 4-hour salicylate and acetaminophen levels.   ----------------------------------------- 6:45 AM on 07/19/2018 -----------------------------------------  Repeat salicylate level trending down.  Patient remains in the ED under IVC pending psychiatry evaluation and disposition.   Irean HongSung, Jade J, MD 07/19/18 [phone]

## 2018-07-19 NOTE — ED Notes (Signed)
Patient alert and oriented, denies Si/hi or avh, he is cooperative, and no behavioral issues, just having anxiety about being here and wants to go back home with His mom, states she has lots of health issues, but she is His main support person. q 15 minute checks and camera surveillance in progress for safety .
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
## 2018-07-19 NOTE — Discharge Instructions (Signed)
Please follow up with the alcoholics anonymous.  Return as needed

## 2019-07-17 ENCOUNTER — Emergency Department: Payer: Self-pay

## 2019-07-17 ENCOUNTER — Encounter: Payer: Self-pay | Admitting: *Deleted

## 2019-07-17 ENCOUNTER — Other Ambulatory Visit: Payer: Self-pay

## 2019-07-17 ENCOUNTER — Emergency Department
Admission: EM | Admit: 2019-07-17 | Discharge: 2019-07-18 | Disposition: A | Discharge status: Home / Self Care | Payer: Self-pay | Attending: Student in an Organized Health Care Education/Training Program | Admitting: Student in an Organized Health Care Education/Training Program

## 2019-07-17 DIAGNOSIS — F17210 Nicotine dependence, cigarettes, uncomplicated: Secondary | ICD-10-CM | POA: Insufficient documentation

## 2019-07-17 DIAGNOSIS — Z79899 Other long term (current) drug therapy: Secondary | ICD-10-CM | POA: Insufficient documentation

## 2019-07-17 DIAGNOSIS — F319 Bipolar disorder, unspecified: Secondary | ICD-10-CM | POA: Diagnosis present

## 2019-07-17 DIAGNOSIS — R45850 Homicidal ideations: Secondary | ICD-10-CM | POA: Insufficient documentation

## 2019-07-17 DIAGNOSIS — R451 Restlessness and agitation: Secondary | ICD-10-CM | POA: Insufficient documentation

## 2019-07-17 DIAGNOSIS — Y908 Blood alcohol level of 240 mg/100 ml or more: Secondary | ICD-10-CM | POA: Insufficient documentation

## 2019-07-17 DIAGNOSIS — F1010 Alcohol abuse, uncomplicated: Secondary | ICD-10-CM | POA: Insufficient documentation

## 2019-07-17 DIAGNOSIS — F19980 Other psychoactive substance use, unspecified with psychoactive substance-induced anxiety disorder: Secondary | ICD-10-CM | POA: Diagnosis present

## 2019-07-17 DIAGNOSIS — R456 Violent behavior: Secondary | ICD-10-CM | POA: Insufficient documentation

## 2019-07-17 DIAGNOSIS — R45851 Suicidal ideations: Secondary | ICD-10-CM | POA: Insufficient documentation

## 2019-07-17 DIAGNOSIS — Z046 Encounter for general psychiatric examination, requested by authority: Secondary | ICD-10-CM | POA: Insufficient documentation

## 2019-07-17 DIAGNOSIS — F3163 Bipolar disorder, current episode mixed, severe, without psychotic features: Secondary | ICD-10-CM | POA: Diagnosis present

## 2019-07-17 DIAGNOSIS — F17200 Nicotine dependence, unspecified, uncomplicated: Secondary | ICD-10-CM | POA: Diagnosis present

## 2019-07-17 LAB — URINE DRUG SCREEN, QUALITATIVE (ARMC ONLY)
Amphetamines, Ur Screen: NOT DETECTED
Barbiturates, Ur Screen: NOT DETECTED
Benzodiazepine, Ur Scrn: POSITIVE — AB
Cannabinoid 50 Ng, Ur ~~LOC~~: POSITIVE — AB
Cocaine Metabolite,Ur ~~LOC~~: NOT DETECTED
MDMA (Ecstasy)Ur Screen: NOT DETECTED
Methadone Scn, Ur: NOT DETECTED
Opiate, Ur Screen: NOT DETECTED
Phencyclidine (PCP) Ur S: NOT DETECTED
Tricyclic, Ur Screen: NOT DETECTED

## 2019-07-17 LAB — COMPREHENSIVE METABOLIC PANEL
ALT: 56 U/L — ABNORMAL HIGH (ref 0–44)
AST: 62 U/L — ABNORMAL HIGH (ref 15–41)
Albumin: 4.6 g/dL (ref 3.5–5.0)
Alkaline Phosphatase: 86 U/L (ref 38–126)
Anion gap: 12 (ref 5–15)
BUN: 7 mg/dL (ref 6–20)
CO2: 22 mmol/L (ref 22–32)
Calcium: 9.2 mg/dL (ref 8.9–10.3)
Chloride: 106 mmol/L (ref 98–111)
Creatinine, Ser: 0.67 mg/dL (ref 0.61–1.24)
GFR calc Af Amer: >60 mL/min (ref >60)
GFR calc non Af Amer: >60 mL/min (ref >60)
Glucose, Bld: 96 mg/dL (ref 70–99)
Potassium: 4.3 mmol/L (ref 3.5–5.1)
Sodium: 140 mmol/L (ref 135–145)
Total Bilirubin: 0.5 mg/dL (ref 0.3–1.2)
Total Protein: 8.7 g/dL — ABNORMAL HIGH (ref 6.5–8.1)

## 2019-07-17 LAB — CBC
HCT: 49.6 % (ref 39.0–52.0)
Hemoglobin: 16.6 g/dL (ref 13.0–17.0)
MCH: 31.1 pg (ref 26.0–34.0)
MCHC: 33.5 g/dL (ref 30.0–36.0)
MCV: 93.1 fL (ref 80.0–100.0)
Platelets: 283 10*3/uL (ref 150–400)
RBC: 5.33 MIL/uL (ref 4.22–5.81)
RDW: 14.7 % (ref 11.5–15.5)
WBC: 9 10*3/uL (ref 4.0–10.5)
nRBC: 0 % (ref 0.0–0.2)

## 2019-07-17 LAB — SALICYLATE LEVEL: Salicylate Lvl: 10.9 mg/dL (ref 2.8–30.0)

## 2019-07-17 LAB — ETHANOL: Alcohol, Ethyl (B): 282 mg/dL — ABNORMAL HIGH (ref <10)

## 2019-07-17 LAB — ACETAMINOPHEN LEVEL: Acetaminophen (Tylenol), Serum: <10 ug/mL — ABNORMAL LOW (ref 10–30)

## 2019-07-17 MED ORDER — QUETIAPINE FUMARATE 25 MG PO TABS
50.0000 mg | ORAL_TABLET | Freq: Every day | ORAL | Status: DC
  Filled 2019-07-17: qty 2

## 2019-07-17 MED ORDER — SALINE SPRAY 0.65 % NA SOLN
1.0000 | Freq: Once | NASAL | Status: AC
  Administered 2019-07-17: 22:00:00 1 via NASAL
  Filled 2019-07-17 (×2): qty 44

## 2019-07-17 MED ORDER — DIPHENHYDRAMINE HCL 25 MG PO CAPS: 50.0000 mg | ORAL_CAPSULE | Freq: Four times a day (QID) | ORAL | Status: DC | PRN

## 2019-07-17 MED ORDER — DIAZEPAM 5 MG PO TABS
2.5000 mg | ORAL_TABLET | Freq: Two times a day (BID) | ORAL | Status: DC | PRN
  Administered 2019-07-17 – 2019-07-18 (×2): 2.5 mg via ORAL
  Filled 2019-07-17 (×2): qty 1

## 2019-07-17 MED ORDER — LORAZEPAM 2 MG PO TABS
2.0000 mg | ORAL_TABLET | Freq: Once | ORAL | Status: AC
  Administered 2019-07-17: 17:00:00 2 mg via ORAL
  Filled 2019-07-17: qty 1

## 2019-07-17 MED ORDER — HALOPERIDOL 5 MG PO TABS
5.0000 mg | ORAL_TABLET | Freq: Once | ORAL | Status: DC
  Filled 2019-07-17: qty 1

## 2019-07-17 MED ORDER — TRAZODONE HCL 50 MG PO TABS
25.0000 mg | ORAL_TABLET | Freq: Every day | ORAL | Status: DC
  Filled 2019-07-17: qty 1

## 2019-07-17 MED ORDER — ACETAMINOPHEN 325 MG PO TABS
650.0000 mg | ORAL_TABLET | Freq: Once | ORAL | Status: AC
  Administered 2019-07-17: 23:00:00 650 mg via ORAL
  Filled 2019-07-17: qty 2
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
## 2019-07-17 NOTE — ED Notes (Signed)
Pt requesting meal tray. Informed pt that dinner tray would not be here until around 5:30pm. Offered pt a snack. Pt agreed and was given graham crackers and a ginger ale per request.
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
## 2019-07-17 NOTE — ED Notes (Signed)
Hourly rounding reveals patient in room. No complaints, stable, in no acute distress. Q15 minute rounds and monitoring via Rover and Officer to continue.   

## 2019-07-17 NOTE — ED Notes (Signed)
PT agitated, telling this RN that he has " good lawyers and I'm not stupid"

## 2019-07-17 NOTE — ED Notes (Signed)
Drivers licence given to be placed in pt belongings bag by deputy at this time.

## 2019-07-17 NOTE — ED Notes (Signed)
Hourly rounding reveals patient in room. No complaints, stable, in no acute distress. Q15 minute rounds and monitoring via Security Cameras to continue. 

## 2019-07-17 NOTE — ED Notes (Signed)
Per policy - pt able to have one phone call from 5pm-7pm lasting 10 minutes. Pt on the phone at this time. Pt stating, "Ain't no body going to stop me from coming to get my shit.Gwenlyn Saran not going to keep me out of there. Are you going to take my kids?" Pts emotions ranging from anger to concern about home life.

## 2019-07-17 NOTE — ED Notes (Signed)
Pt throwing food in room. Still threatening staff.

## 2019-07-17 NOTE — ED Notes (Signed)
Report to include Situation, Background, Assessment, and Recommendations received from [place] RN. Patient alert and oriented, warm and dry, in no acute distress. Patient denies SI, HI, AVH and pain. Patient is here for Alcohol and drug detox. Patient made aware of Q15 minute rounds and Engineer, drilling presence for their safety. Patient instructed to come to me with needs or concerns.

## 2019-07-17 NOTE — ED Notes (Signed)
IVC PENDING  CONSULT ?

## 2019-07-17 NOTE — ED Triage Notes (Signed)
Pt brought to ED under IVC reporting he is "crazy as hell" Pt reports "I would have burned this civilization to the ground." Pt cooperative in triage but reporting he is just drunk and he just needs to be cleared for alcoholism.     Black shorts, black underwear, black and red shoes, black lighter with picture of pt and son.

## 2019-07-17 NOTE — ED Notes (Signed)
Pt given dinner tray at this time, pt sitting up eating

## 2019-07-17 NOTE — ED Provider Notes (Signed)
[facility] Emergency Department Provider Note    First MD Initiated Contact with Patient 07/17/19 1643     (approximate)  I have reviewed the triage vital signs and the nursing notes.   HISTORY  Chief Complaint IVC    HPI Nathan [name] is a 33 y.o. male plosive past medical history presents the ER under IVC via [place] after being found intoxicated and reportedly was telling officers that he was homicidal and suicidal.  Patient very agitated threatening to call his attorney.  Very difficult and poor historian at this time.  Denies any pain.  Was overheard stating "I am going to rip someone's spine out." Then saying "you cannot keep someone under IVC for alcohol"  " I am not suicidal or homicidal, it is known as a 34."     Past Medical History:  Diagnosis Date  . Bipolar 1 disorder ([place])   . Chronic headache   . Hep C w/o coma, chronic ([place])   . Horseshoe kidney    Family History  Problem Relation Age of Onset  . Hypertension Mother   . Diabetes Mother   . Hypertension Father   . Heart failure Father   . Diabetes Father    Past Surgical History:  Procedure Laterality Date  . TONSILLECTOMY     Patient Active Problem List   Diagnosis Date Noted  . Bipolar 1 disorder, depressed ([place]) 01/31/2018  . Bipolar 1 disorder, mixed, severe ([place]) 04/22/2016  . Alcohol use disorder, mild, abuse 04/22/2016  . Suicidal ideation 04/22/2016  . Tobacco use disorder 04/22/2016  . 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 Authorizing Provider  diazepam (VALIUM) 5 MG tablet Take 2.5 mg by mouth every 12 (twelve) hours as needed for anxiety.     [provider]  Ledipasvir-Sofosbuvir (HARVONI) 90-400 MG TABS Take by mouth 1 day or 1 dose.    [provider]  QUEtiapine (SEROQUEL) 50 MG tablet Take 1 tablet (50  mg total) by mouth at bedtime. 01/31/18   Clapacs, Madie Reno, MD  traZODone (DESYREL) 50 MG tablet Take 25 mg by mouth at bedtime.    [provider]    Allergies Geodon [ziprasidone hcl]    Social History Social History   Tobacco Use  . Smoking status: Current Every Day Smoker    Packs/day: 1.50    Types: Cigarettes  . Smokeless tobacco: Current User    Types: Chew  Substance Use Topics  . Alcohol use: Yes  . Drug use: No    Review of Systems Patient denies headaches, rhinorrhea, blurry vision, numbness, shortness of breath, chest pain, edema, cough, abdominal pain, nausea, vomiting, diarrhea, dysuria, fevers, rashes or hallucinations unless otherwise stated above in HPI. ____________________________________________   PHYSICAL EXAM:  VITAL SIGNS: Vitals:   07/17/19 1627  BP: (!) 149/98  Pulse: (!) 115  Resp: 16  Temp: 99.7 F (37.6 C)  SpO2: 99%    Constitutional: Alert , agitated, intoxicated appearing Eyes: Conjunctivae are normal.  Head: Atraumatic. Nose: No congestion/rhinnorhea. Mouth/Throat: Mucous membranes are moist.   Neck: No stridor. Painless ROM.  Cardiovascular: Normal rate, regular rhythm. Grossly normal heart sounds.  Good peripheral circulation. Respiratory: Normal respiratory effort.  No retractions. Lungs CTAB. Gastrointestinal: Soft and nontender. No distention. No abdominal bruits. No CVA tenderness. Genitourinary:  Musculoskeletal: No lower extremity tenderness nor edema.  No  joint effusions. Neurologic:  No gross focal neurologic deficits are appreciated. No facial droop Skin:  Skin is warm, dry and intact. No rash noted. Psychiatric: agitated, and disorganized, intoxicated.  ____________________________________________   LABS (all labs ordered are listed, but only abnormal results are displayed)  Results for orders placed or performed during the hospital encounter of 07/17/19 (from the past 24 hour(s))  Comprehensive metabolic  panel     Status: Abnormal   Collection Time: 07/17/19  4:33 PM  Result Value Ref Range   Sodium 140 135 - 145 mmol/L   Potassium 4.3 3.5 - 5.1 mmol/L   Chloride 106 98 - 111 mmol/L   CO2 22 22 - 32 mmol/L   Glucose, Bld 96 70 - 99 mg/dL   BUN 7 6 - 20 mg/dL   Creatinine, Ser 1.610.67 0.61 - 1.24 mg/dL   Calcium 9.2 8.9 - 09.610.3 mg/dL   Total Protein 8.7 (H) 6.5 - 8.1 g/dL   Albumin 4.6 3.5 - 5.0 g/dL   AST 62 (H) 15 - 41 U/L   ALT 56 (H) 0 - 44 U/L   Alkaline Phosphatase 86 38 - 126 U/L   Total Bilirubin 0.5 0.3 - 1.2 mg/dL   GFR calc non Af Amer >60 >60 mL/min   GFR calc Af Amer >60 >60 mL/min   Anion gap 12 5 - 15  Ethanol     Status: Abnormal   Collection Time: 07/17/19  4:33 PM  Result Value Ref Range   Alcohol, Ethyl (B) 282 (H) <10 mg/dL  Salicylate level     Status: None   Collection Time: 07/17/19  4:33 PM  Result Value Ref Range   Salicylate Lvl 10.9 2.8 - 30.0 mg/dL  Acetaminophen level     Status: Abnormal   Collection Time: 07/17/19  4:33 PM  Result Value Ref Range   Acetaminophen (Tylenol), Serum <10 (L) 10 - 30 ug/mL  cbc     Status: None   Collection Time: 07/17/19  4:33 PM  Result Value Ref Range   WBC 9.0 4.0 - 10.5 K/uL   RBC 5.33 4.22 - 5.81 MIL/uL   Hemoglobin 16.6 13.0 - 17.0 g/dL   HCT 04.549.6 40.939.0 - 81.152.0 %   MCV 93.1 80.0 - 100.0 fL   MCH 31.1 26.0 - 34.0 pg   MCHC 33.5 30.0 - 36.0 g/dL   RDW 91.414.7 78.211.5 - 95.615.5 %   Platelets 283 150 - 400 K/uL   nRBC 0.0 0.0 - 0.2 %  Urine Drug Screen, Qualitative     Status: Abnormal   Collection Time: 07/17/19  4:33 PM  Result Value Ref Range   Tricyclic, Ur Screen NONE DETECTED NONE DETECTED   Amphetamines, Ur Screen NONE DETECTED NONE DETECTED   MDMA (Ecstasy)Ur Screen NONE DETECTED NONE DETECTED   Cocaine Metabolite,Ur [place] NONE DETECTED NONE DETECTED   Opiate, Ur Screen NONE DETECTED NONE DETECTED   Phencyclidine (PCP) Ur S NONE DETECTED NONE DETECTED   Cannabinoid 50 Ng, Ur [place] POSITIVE (A) NONE DETECTED    Barbiturates, Ur Screen NONE DETECTED NONE DETECTED   Benzodiazepine, Ur Scrn POSITIVE (A) NONE DETECTED   Methadone Scn, Ur NONE DETECTED NONE DETECTED   ____________________________________________ ____________________________________________  RADIOLOGY   ____________________________________________   PROCEDURES  Procedure(s) performed:  Procedures    Critical Care performed: no ____________________________________________   INITIAL IMPRESSION / ASSESSMENT AND PLAN / ED COURSE  Pertinent labs & imaging results that were available during my care of the patient were  reviewed by me and considered in my medical decision making (see chart for details).   DDX: Psychosis, delirium, medication effect, noncompliance, polysubstance abuse, Si, Hi, depression   Jekhi [name] [name] is a 33 y.o. who presents to the ED with agitation as well as reported suicidal ideation homicidal ideation.  Patient yelling loudly agitated and somewhat disorganized.  Nontoxic appearing was able to ambulate into the behavioral holding unit.  We will continue IVC as he does appear intoxicated with report of SI and HI.  Will certainly need to observe him overnight under IVC and reassess after he is metabolized his alcohol.  Based on review of the medical records appears that this is a recurring theme.     The patient was evaluated in Emergency Department today for the symptoms described in the history of present illness. He/she was evaluated in the context of the global COVID-19 pandemic, which necessitated consideration that the patient might be at risk for infection with the SARS-CoV-2 virus that causes COVID-19. Institutional protocols and algorithms that pertain to the evaluation of patients at risk for COVID-19 are in a state of rapid change based on information released by regulatory bodies including the CDC and federal and state organizations. These policies and algorithms were followed during the patient's  care in the ED.  As part of my medical decision making, I reviewed the following data within the electronic MEDICAL RECORD NUMBER Nursing notes reviewed and incorporated, Labs reviewed, notes from prior ED visits and [place] Controlled Substance Database   ____________________________________________   FINAL CLINICAL IMPRESSION(S) / ED DIAGNOSES  Final diagnoses:  Suicidal ideations  Alcohol abuse      NEW MEDICATIONS STARTED DURING THIS VISIT:  New Prescriptions   No medications on file     Note:  This document was prepared using Dragon voice recognition software and may include unintentional dictation errors.    Willy Eddyobinson, Phillipe Clemon, MD 07/17/19 [phone]

## 2019-07-18 ENCOUNTER — Encounter: Payer: Self-pay | Admitting: Behavioral Health

## 2019-07-18 DIAGNOSIS — F319 Bipolar disorder, unspecified: Secondary | ICD-10-CM

## 2019-07-18 MED ORDER — ACETAMINOPHEN 325 MG PO TABS
650.0000 mg | ORAL_TABLET | Freq: Once | ORAL | Status: AC
  Administered 2019-07-18: 12:00:00 650 mg via ORAL
  Filled 2019-07-18: qty 2

## 2019-07-18 NOTE — ED Notes (Signed)
Gave pt lunch tray with juice. 

## 2019-07-18 NOTE — Consult Note (Signed)
[facility] Face-to-Face Psychiatry Consult   Reason for Consult: Aggression Referring Physician: Dr. Roxan Hockey Patient Identification: Nathan [name] MRN:  [id number] Principal Diagnosis: Bipolar 1 disorder, depressed (HCC) Diagnosis:  Principal Problem:   Bipolar 1 disorder, depressed (HCC) Active Problems:   Substance or medication-induced anxiety disorder (HCC)   Bipolar 1 disorder, mixed, severe (HCC)   Alcohol use disorder, mild, abuse   Suicidal ideation   Tobacco use disorder   Total Time spent with patient: 45 minutes  Subjective: " When can I get the hell out of here?" [facility] [name] is a 33 y.o. male patient presented to [facility] ED via law enforcement under involuntary commitment status (IVC).  The patient is extremely angry, verbally aggressive, threatening staff "I am going to take those people out there spine out." The patient verbalize that "I have 3 lawsuits pending against this hospital right now."  He voiced "no one can hold me here because I am intoxicated."  I am not suicidal or homicidal."  "I just want to get the fu** out of here now."  It was explained to the patient being discharged depends on his behavior while in the ED. This provider explained to the patient presenting to be irate, inebriated and verbally aggressive to staff only confirm that he is not safe to be discharged. The patient was seen face-to-face by this provider; chart reviewed and consulted with Dr. Roxan Hockey on 07/17/2019 due to the care of the patient. It was discussed with the provider that the patient does not meet criteria to be admitted to the inpatient unit.   It was discussed with the EDP that the patient is here due to substance use and does not require psychiatric inpatient admission. On evaluation the patient is alert and oriented x3, angry and uncooperative, and his mood-congruent with affect.  The patient does not appear to be responding to internal or external stimuli. Neither is the patient  presenting with any delusional thinking. The patient denies auditory or visual hallucinations. The patient denies any suicidal, homicidal, or self-harm ideations.  The patient expressed " I am none of what you asked me.  I have been using alcohol and that is not a crime."  The patient is not presenting with any psychotic or paranoid behaviors. During an encounter with the patient, he was able to answer questions appropriately.  Collateral was not obtained by mother Ms. Draco Malczewski [phone]), due to this provider calling and unable to leave a HIPPA approved message.  Plan: The patient is not a safety risk to self or others and does not require psychiatric inpatient admission for stabilization and treatment.  The patient will be held overnight for observation due to his alcohol level being 282 at 1633 and will be discharged in the morning.  HPI: Per Dr. Carolyn Stare; [facility] Nathan [name] is a 33 y.o. male plosive past medical history presents the ER under IVC via [facility] [employer] after being found intoxicated and reportedly was telling officers that he was homicidal and suicidal.  Patient very agitated threatening to call his attorney.  Very difficult and poor historian at this time.  Denies any pain.  Was overheard stating "I am going to rip someone's spine out." Then saying "you cannot keep someone under IVC for alcohol"  " I am not suicidal or homicidal, it is known as a 5150."  Past Psychiatric History:  Bipolar 1 disorder (HCC)   Risk to Self:  No Risk to Others:  No Prior Inpatient Therapy:  Yes Prior  Outpatient Therapy:  Yes  Past Medical History:  Past Medical History:  Diagnosis Date  . Bipolar 1 disorder (HCC)   . Chronic headache   . 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 Horseshoe kidney     Past Surgical History:  Procedure Laterality Date  . TONSILLECTOMY     Family History:  Family History  Problem Relation Age of Onset  . Hypertension Mother   . Diabetes Mother    . Hypertension Father   . Heart failure Father   . Diabetes Father    Family Psychiatric  History: Patient refused to provide information Social History:  Social History   Substance and Sexual Activity  Alcohol Use Yes     Social History   Substance and Sexual Activity  Drug Use No    Social History   Socioeconomic History  . Marital status: Single    Spouse name: Not on file  . Number of children: Not on file  . Years of education: Not on file  . Highest education level: Not on file  Occupational History  . Not on file  Social Needs  . Financial resource strain: Not on file  . Food insecurity    Worry: Not on file    Inability: Not on file  . Transportation needs    Medical: Not on file    Non-medical: Not on file  Tobacco Use  . Smoking status: Current Every Day Smoker    Packs/day: 1.50    Types: Cigarettes  . Smokeless tobacco: Current User    Types: Chew  Substance and Sexual Activity  . Alcohol use: Yes  . Drug use: No  . Sexual activity: Yes  Lifestyle  . Physical activity    Days per week: Not on file    Minutes per session: Not on file  . Stress: Not on file  Relationships  . Social Musicianconnections    Talks on phone: Not on file    Gets together: Not on file    Attends religious service: Not on file    Active member of club or organization: Not on file    Attends meetings of clubs or organizations: Not on file    Relationship status: Not on file  Other Topics Concern  . Not on file  Social History Narrative  . Not on file   Additional Social History:    Allergies:   Allergies  Allergen Reactions  . Geodon [Ziprasidone Hcl] Other (See Comments)    Seizures    Labs:  Results for orders placed or performed during the hospital encounter of 07/17/19 (from the past 48 hour(s))  Comprehensive metabolic panel     Status: Abnormal   Collection Time: 07/17/19  4:33 PM  Result Value Ref Range   Sodium 140 135 - 145 mmol/L   Potassium 4.3 3.5 -  5.1 mmol/L   Chloride 106 98 - 111 mmol/L   CO2 22 22 - 32 mmol/L   Glucose, Bld 96 70 - 99 mg/dL   BUN 7 6 - 20 mg/dL   Creatinine, Ser 4.090.67 0.61 - 1.24 mg/dL   Calcium 9.2 8.9 - 81.110.3 mg/dL   Total Protein 8.7 (H) 6.5 - 8.1 g/dL   Albumin 4.6 3.5 - 5.0 g/dL   AST 62 (H) 15 - 41 U/L   ALT 56 (H) 0 - 44 U/L   Alkaline Phosphatase 86 38 - 126 U/L   Total Bilirubin 0.5 0.3 - 1.2 mg/dL   GFR calc  non Af Amer >60 >60 mL/min   GFR calc Af Amer >60 >60 mL/min   Anion gap 12 5 - 15    Comment: Performed at [facility], [address] [place], [place] [address]  Ethanol     Status: Abnormal   Collection Time: 07/17/19  4:33 PM  Result Value Ref Range   Alcohol, Ethyl (B) 282 (H) <10 mg/dL    Comment: (NOTE) Lowest detectable limit for serum alcohol is 10 mg/dL. For medical purposes only. Performed at [facility], [address] [place], [place] [address]   Salicylate level     Status: None   Collection Time: 07/17/19  4:33 PM  Result Value Ref Range   Salicylate Lvl 10.9 2.8 - 30.0 mg/dL    Comment: Performed at [facility], [address] [place], [place] [address]  Acetaminophen level     Status: Abnormal   Collection Time: 07/17/19  4:33 PM  Result Value Ref Range   Acetaminophen (Tylenol), Serum <10 (L) 10 - 30 ug/mL    Comment: (NOTE) Therapeutic concentrations vary significantly. A range of 10-30 ug/mL  may be an effective concentration for many patients. However, some  are best treated at concentrations outside of this range. Acetaminophen concentrations >150 ug/mL at 4 hours after ingestion  and >50 ug/mL at 12 hours after ingestion are often associated with  toxic reactions. Performed at [facility], [address] [place], [place] [address]   cbc     Status: None   Collection Time: 07/17/19  4:33 PM  Result Value Ref Range   WBC 9.0 4.0 - 10.5 K/uL   RBC 5.33 4.22 - 5.81 MIL/uL   Hemoglobin 16.6 13.0 - 17.0 g/dL    HCT 29.549.6 62.139.0 - 30.852.0 %   MCV 93.1 80.0 - 100.0 fL   MCH 31.1 26.0 - 34.0 pg   MCHC 33.5 30.0 - 36.0 g/dL   RDW 65.714.7 84.611.5 - 96.215.5 %   Platelets 283 150 - 400 K/uL   nRBC 0.0 0.0 - 0.2 %    Comment: Performed at [facility], [address] [place], [place] [address]  Urine Drug Screen, Qualitative     Status: Abnormal   Collection Time: 07/17/19  4:33 PM  Result Value Ref Range   Tricyclic, Ur Screen NONE DETECTED NONE DETECTED   Amphetamines, Ur Screen NONE DETECTED NONE DETECTED   MDMA (Ecstasy)Ur Screen NONE DETECTED NONE DETECTED   Cocaine Metabolite,Ur [place] NONE DETECTED NONE DETECTED   Opiate, Ur Screen NONE DETECTED NONE DETECTED   Phencyclidine (PCP) Ur S NONE DETECTED NONE DETECTED   Cannabinoid 50 Ng, Ur [place] POSITIVE (A) NONE DETECTED   Barbiturates, Ur Screen NONE DETECTED NONE DETECTED   Benzodiazepine, Ur Scrn POSITIVE (A) NONE DETECTED   Methadone Scn, Ur NONE DETECTED NONE DETECTED    Comment: (NOTE) Tricyclics + metabolites, urine    Cutoff 1000 ng/mL Amphetamines + metabolites, urine  Cutoff 1000 ng/mL MDMA (Ecstasy), urine              Cutoff 500 ng/mL Cocaine Metabolite, urine          Cutoff 300 ng/mL Opiate + metabolites, urine        Cutoff 300 ng/mL Phencyclidine (PCP), urine         Cutoff 25 ng/mL Cannabinoid, urine                 Cutoff 50 ng/mL Barbiturates + metabolites, urine  Cutoff  200 ng/mL Benzodiazepine, urine              Cutoff 200 ng/mL Methadone, urine                   Cutoff 300 ng/mL The urine drug screen provides only a preliminary, unconfirmed analytical test result and should not be used for non-medical purposes. Clinical consideration and professional judgment should be applied to any positive drug screen result due to possible interfering substances. A more specific alternate chemical method must be used in order to obtain a confirmed analytical result. Gas chromatography / mass spectrometry (GC/MS) is the  preferred confirmat ory method. Performed at [facility], [address], [place], [place] [address]     Current Facility-Administered Medications  Medication Dose Route Frequency Provider Last Rate Last Dose  . diazepam (VALIUM) tablet 2.5 mg  2.5 mg Oral Q12H PRN Merlyn Lot, MD   2.5 mg at 07/17/19 1746  . diphenhydrAMINE (BENADRYL) capsule 50 mg  50 mg Oral Q6H PRN Merlyn Lot, MD      . haloperidol (HALDOL) tablet 5 mg  5 mg Oral Once Merlyn Lot, MD      . QUEtiapine (SEROQUEL) tablet 50 mg  50 mg Oral QHS Merlyn Lot, MD      . traZODone (DESYREL) tablet 25 mg  25 mg Oral Davene Costain, MD       Current Outpatient Medications  Medication Sig Dispense Refill  . diazepam (VALIUM) 5 MG tablet Take 2.5 mg by mouth every 12 (twelve) hours as needed for anxiety.     . Ledipasvir-Sofosbuvir (HARVONI) 90-400 MG TABS Take by mouth 1 day or 1 dose.    Marland Kitchen QUEtiapine (SEROQUEL) 50 MG tablet Take 1 tablet (50 mg total) by mouth at bedtime. 30 tablet 1  . traZODone (DESYREL) 50 MG tablet Take 25 mg by mouth at bedtime.      Musculoskeletal: Strength & Muscle Tone: within normal limits Gait & Station: normal Patient leans: N/A  Psychiatric Specialty Exam: Physical Exam  ROS  Blood pressure 114/76, pulse 74, temperature 98.2 F (36.8 C), temperature source Oral, resp. rate 18, height 5\' 9"  (1.753 m), weight 70.8 kg, SpO2 97 %.Body mass index is 23.05 kg/m.  General Appearance: Fairly Groomed  Eye Contact:  Fair  Speech:  Clear and Coherent and Pressured  Volume:  Increased  Mood:  Angry, Anxious and Irritable  Affect:  Inappropriate  Thought Process:  Coherent  Orientation:  Full (Time, Place, and Person)  Thought Content:  Logical  Suicidal Thoughts:  No  Homicidal Thoughts:  No  Memory:  Immediate;   Good Recent;   Good  Judgement:  Fair  Insight:  Lacking  Psychomotor Activity:  Decreased  Concentration:  Concentration: Fair and  Attention Span: Fair  Recall:  Good  Fund of Knowledge:  Good  Language:  Good  Akathisia:  Negative  Handed:  Right  AIMS (if indicated):     Assets:  Others:  Patient denies needing any help and voiced he does not need to be here.  ADL's:  Intact  Cognition:  WNL  Sleep:   Good     Treatment Plan Summary: Daily contact with patient to assess and evaluate symptoms and progress in treatment  Disposition: No evidence of imminent risk to self or others at present.   Patient does not meet criteria for psychiatric inpatient admission. Supportive therapy provided about ongoing stressors.  The patient will be held overnight for  observation due to his alcohol level being 282 at 1633 and will be discharged in the morning.   Gillermo MurdochJacqueline Latrease Kunde, NP 07/18/2019 3:18 AM

## 2019-07-18 NOTE — ED Notes (Signed)
Hourly rounding reveals patient in room. No complaints, stable, in no acute distress. Q15 minute rounds and monitoring via Rover and Officer to continue.   

## 2019-07-18 NOTE — ED Notes (Signed)
Pt awake but does not want breakfast at this time, tray placed on sink in rm.
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
## 2019-07-18 NOTE — ED Notes (Signed)
Pt given cup of sprite.  

## 2019-07-18 NOTE — ED Provider Notes (Signed)
-----------------------------------------   12:43 PM on 07/18/2019 -----------------------------------------   Blood pressure 114/76, pulse 74, temperature 98.2 F (36.8 C), temperature source Oral, resp. rate 18, height 5\' 9"  (1.753 m), weight 70.8 kg, SpO2 97 %.  The patient is calm and cooperative at this time.  There have been no acute events since the last update.  Discussed with psychiatry nurse practitioner who finds that the symptoms are all alcohol induced from intoxication.  He obtain collateral from the patient's family who also confirmed that this is a frequent occurrence for the patient whenever he binge drinks.  They have had a frank discussion that the patient will not be welcome back at the family home and he will go stay at another property the family maintains.  He has been encouraged to seek treatment for his alcohol abuse.  No signs of withdrawal at this time.  He is medically and psychiatrically stable without any imminent danger to himself or others   Carrie Mew, MD 07/18/19 1245

## 2019-07-18 NOTE — Discharge Instructions (Addendum)
Please call [facility] or residential treatment services to arrange for treatment for alcohol abuse.

## 2019-07-18 NOTE — ED Notes (Signed)
D/C home with bus pass.  Reviewed bus routes with patient . Understanding verbalized.  D/C home

## 2019-10-02 ENCOUNTER — Other Ambulatory Visit: Payer: Self-pay

## 2019-10-02 DIAGNOSIS — Z20822 Contact with and (suspected) exposure to covid-19: Secondary | ICD-10-CM

## 2019-10-04 LAB — NOVEL CORONAVIRUS, NAA: SARS-CoV-2, NAA: NOT DETECTED

## 2019-12-15 ENCOUNTER — Encounter: Payer: Self-pay | Admitting: Emergency Medicine

## 2019-12-15 ENCOUNTER — Other Ambulatory Visit: Payer: Self-pay

## 2019-12-15 ENCOUNTER — Emergency Department
Admission: EM | Admit: 2019-12-15 | Discharge: 2019-12-15 | Disposition: A | Discharge status: Home / Self Care | Payer: Self-pay | Attending: Student in an Organized Health Care Education/Training Program | Admitting: Student in an Organized Health Care Education/Training Program

## 2019-12-15 DIAGNOSIS — Z79899 Other long term (current) drug therapy: Secondary | ICD-10-CM | POA: Insufficient documentation

## 2019-12-15 DIAGNOSIS — F17210 Nicotine dependence, cigarettes, uncomplicated: Secondary | ICD-10-CM | POA: Insufficient documentation

## 2019-12-15 DIAGNOSIS — F1010 Alcohol abuse, uncomplicated: Secondary | ICD-10-CM | POA: Insufficient documentation

## 2019-12-15 MED ORDER — DIPHENHYDRAMINE HCL 50 MG/ML IJ SOLN
25.0000 mg | Freq: Once | INTRAMUSCULAR | Status: DC
  Filled 2019-12-15: qty 1

## 2019-12-15 MED ORDER — MIDAZOLAM HCL 5 MG/5ML IJ SOLN
4.0000 mg | Freq: Once | INTRAMUSCULAR | Status: DC
  Filled 2019-12-15: qty 5

## 2019-12-15 MED ORDER — HALOPERIDOL LACTATE 5 MG/ML IJ SOLN
5.0000 mg | Freq: Once | INTRAMUSCULAR | Status: DC
  Filled 2019-12-15: qty 1

## 2019-12-15 MED ORDER — ONDANSETRON 4 MG PO TBDP
4.0000 mg | ORAL_TABLET | Freq: Once | ORAL | Status: AC
  Administered 2019-12-15: 4 mg via ORAL
  Filled 2019-12-15: qty 1

## 2019-12-15 NOTE — ED Provider Notes (Signed)
[facility] Emergency Department Provider Note    None    (approximate)  I have reviewed the triage vital signs and the nursing notes.   HISTORY  Chief Complaint Mental Health Problem    HPI Nathan [name] is a 33 y.o. male the below listed past medical history presents the ER after drinking some beers this evening reportedly wanting refill of Suboxone.  States he called police stating that he was worried that he was going to run out of his Suboxone before getting his refill tomorrow and was told to come to the ER additional Suboxone.  He denies any SI or HI.  Does admit to drinking alcohol this evening.      Past Medical History:  Diagnosis Date  . Bipolar 1 disorder ([place])   . Chronic headache   . Hep C w/o coma, chronic ([place])   . Horseshoe kidney    Family History  Problem Relation Age of Onset  . Hypertension Mother   . Diabetes Mother   . Hypertension Father   . Heart failure Father   . Diabetes Father    Past Surgical History:  Procedure Laterality Date  . TONSILLECTOMY     Patient Active Problem List   Diagnosis Date Noted  . Bipolar 1 disorder, depressed ([place]) 01/31/2018  . Bipolar 1 disorder, mixed, severe ([place]) 04/22/2016  . Alcohol use disorder, mild, abuse 04/22/2016  . Suicidal ideation 04/22/2016  . Tobacco use disorder 04/22/2016  . 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 Authorizing Provider  diazepam (VALIUM) 5 MG tablet Take 2.5 mg by mouth every 12 (twelve) hours as needed for anxiety.     [provider]  Ledipasvir-Sofosbuvir (HARVONI) 90-400 MG TABS Take by mouth 1 day or 1 dose.    [provider]  QUEtiapine (SEROQUEL) 50 MG tablet Take 1 tablet (50 mg total) by mouth at bedtime. 01/31/18   Clapacs, Madie Reno, MD  traZODone (DESYREL) 50 MG tablet Take 25 mg by mouth at bedtime.     [provider]    Allergies Geodon [ziprasidone hcl]    Social History Social History   Tobacco Use  . Smoking status: Current Every Day Smoker    Packs/day: 1.50    Types: Cigarettes  . Smokeless tobacco: Current User    Types: Chew  Substance Use Topics  . Alcohol use: Yes  . Drug use: No    Review of Systems Patient denies headaches, rhinorrhea, blurry vision, numbness, shortness of breath, chest pain, edema, cough, abdominal pain, nausea, vomiting, diarrhea, dysuria, fevers, rashes or hallucinations unless otherwise stated above in HPI. ____________________________________________   PHYSICAL EXAM:  VITAL SIGNS: Vitals:   12/15/19 0154  BP: 117/85  Pulse: 69  Resp: 18  Temp: 97.8 F (36.6 C)  SpO2: 97%    Constitutional: Alert and oriented.  Ambulating with a steady gait Eyes: Conjunctivae are normal.  Head: Atraumatic. Nose: No congestion/rhinnorhea. Mouth/Throat: Mucous membranes are moist.   Neck: No stridor. Painless ROM.  Cardiovascular: Normal rate, regular rhythm. Grossly normal heart sounds.  Good peripheral circulation. Respiratory: Normal respiratory effort.  No retractions. Lungs CTAB. Gastrointestinal: Soft and nontender. No distention. No abdominal bruits. No CVA tenderness. Genitourinary: deferred Musculoskeletal: No lower extremity tenderness nor edema.  No joint effusions. Neurologic:  Normal speech and language. No gross focal neurologic deficits are appreciated. No facial droop Skin:  Skin is warm, dry and intact. No rash noted. Psychiatric: patient calm, somewhat irritable being told that he would not receive a refill on suboxone and that would need to be done with the original prescribing provider,  No agitation.  ____________________________________________   LABS (all labs ordered are listed, but only abnormal results are displayed)  No results found for this or any previous visit (from the past 24  hour(s)). ____________________________________________   ____________________________________________  RADIOLOGY   ____________________________________________   PROCEDURES  Procedure(s) performed:  Procedures    Critical Care performed: no ____________________________________________   INITIAL IMPRESSION / ASSESSMENT AND PLAN / ED COURSE  Pertinent labs & imaging results that were available during my care of the patient were reviewed by me and considered in my medical decision making (see chart for details).   DDX: Psychosis, delirium, medication effect, noncompliance, polysubstance abuse, Si, Hi, depression   Nathan [name] is a 33 y.o. who presents to the ED with for evaluation of intoxication and requesting suboxone refill.  No SI or HI.  Able to ambulate and appears clinically stable. Informed we would not be refilling his suboxone, particularly in the setting of admitting etoh abuse.  Does not meet criteria for IVC.  Appropriate for outpatient follow up.       The patient was evaluated in Emergency Department today for the symptoms described in the history of present illness. He/she was evaluated in the context of the global COVID-19 pandemic, which necessitated consideration that the patient might be at risk for infection with the SARS-CoV-2 virus that causes COVID-19. Institutional protocols and algorithms that pertain to the evaluation of patients at risk for COVID-19 are in a state of rapid change based on information released by regulatory bodies including the CDC and federal and state organizations. These policies and algorithms were followed during the patient's care in the ED.  As part of my medical decision making, I reviewed the following data within the electronic MEDICAL RECORD NUMBER Nursing notes reviewed and incorporated, Labs reviewed, notes from prior ED visits and [place] Controlled Substance Database   ____________________________________________   FINAL  CLINICAL IMPRESSION(S) / ED DIAGNOSES  Final diagnoses:  Alcohol abuse      NEW MEDICATIONS STARTED DURING THIS VISIT:  New Prescriptions   No medications on file     Note:  This document was prepared using Dragon voice recognition software and may include unintentional dictation errors.    Willy Eddy, MD 12/15/19 [phone]

## 2019-12-15 NOTE — ED Triage Notes (Signed)
Pt ambulatory to triage with no difficulty. Brought to ED by [facility] after the patient called asking to come to the hospital for help with his mental health issues. Pt states he is two days away from a melt down and out of his meds.

## 2019-12-15 NOTE — ED Notes (Signed)
MD Quentin Cornwall at pt bedside

## 2019-12-15 NOTE — ED Notes (Signed)
Pt. Requested and was given cup of soda.

## 2019-12-15 NOTE — ED Notes (Signed)
Pt. Appears intoxicated.  Pt. Making demands, "I need something to get me through the night and I need a phone"

## 2019-12-15 NOTE — ED Notes (Signed)
Pt. States, "I just want a phone so I can get a ride home.  I am not suicidal or homicidal, I came here voluntary".  Pt. States "I want to be with my family".  Pt. Asked "how can we help you"  Pt. Stated again"get me a phone".

## 2019-12-15 NOTE — ED Notes (Signed)
Pt. Used phone to call mother, pt. Stated mother is 10 minutes away.  Pt. Told we had to confirm ride before discharge.  Pt. Ok with this arrangement.

## 2019-12-17 ENCOUNTER — Emergency Department: Payer: Self-pay

## 2019-12-17 ENCOUNTER — Encounter: Payer: Self-pay | Admitting: Emergency Medicine

## 2019-12-17 ENCOUNTER — Emergency Department
Admission: EM | Admit: 2019-12-17 | Discharge: 2019-12-17 | Disposition: A | Discharge status: Home / Self Care | Payer: Self-pay | Attending: Emergency Medicine | Admitting: Emergency Medicine

## 2019-12-17 DIAGNOSIS — F17220 Nicotine dependence, chewing tobacco, uncomplicated: Secondary | ICD-10-CM | POA: Insufficient documentation

## 2019-12-17 DIAGNOSIS — S0181XA Laceration without foreign body of other part of head, initial encounter: Secondary | ICD-10-CM | POA: Insufficient documentation

## 2019-12-17 DIAGNOSIS — Y92009 Unspecified place in unspecified non-institutional (private) residence as the place of occurrence of the external cause: Secondary | ICD-10-CM | POA: Insufficient documentation

## 2019-12-17 DIAGNOSIS — Y939 Activity, unspecified: Secondary | ICD-10-CM | POA: Insufficient documentation

## 2019-12-17 DIAGNOSIS — F17210 Nicotine dependence, cigarettes, uncomplicated: Secondary | ICD-10-CM | POA: Insufficient documentation

## 2019-12-17 DIAGNOSIS — Z79899 Other long term (current) drug therapy: Secondary | ICD-10-CM | POA: Insufficient documentation

## 2019-12-17 DIAGNOSIS — F10920 Alcohol use, unspecified with intoxication, uncomplicated: Secondary | ICD-10-CM | POA: Insufficient documentation

## 2019-12-17 DIAGNOSIS — Y999 Unspecified external cause status: Secondary | ICD-10-CM | POA: Insufficient documentation

## 2019-12-17 MED ORDER — ACETAMINOPHEN 500 MG PO TABS: 1000.0000 mg | ORAL_TABLET | Freq: Once | ORAL | Status: AC

## 2019-12-17 MED ORDER — ACETAMINOPHEN 500 MG PO TABS
ORAL_TABLET | ORAL | Status: AC
  Administered 2019-12-17: 23:00:00 1000 mg via ORAL
  Filled 2019-12-17: qty 2

## 2019-12-17 NOTE — ED Notes (Signed)
Pt intoxicated per pt report. Pt mildly uncooperative and aggressive at this time. Glue applied to forehead by Joni Fears MD. CT head pending.
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
## 2019-12-17 NOTE — ED Notes (Signed)
Pt ambulatory to restroom with security.

## 2019-12-17 NOTE — ED Notes (Signed)
Pt ambulated out of department, refused to wait for d/c orders and paperwork. AMA paperwork signed. Pt states mother to pick pt up.

## 2019-12-17 NOTE — ED Notes (Signed)
Pt refused vital signs.

## 2019-12-17 NOTE — ED Notes (Signed)
Pt agrees to have CT of head but reports he will be leaving AMA after that. Pt talking on the phone at this time and in NAD. Pt remains intoxicated but is able to ambulate independently.
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
## 2019-12-17 NOTE — Discharge Instructions (Signed)
Your CT scan of the head was okay today.  You can take anti-inflammatory pain medicine such as Aleve or ibuprofen as needed for headache.  You will need to follow-up with [facility] to continue receiving Suboxone.

## 2020-02-17 ENCOUNTER — Encounter: Payer: Self-pay | Admitting: Emergency Medicine

## 2020-02-17 ENCOUNTER — Emergency Department
Admission: EM | Admit: 2020-02-17 | Discharge: 2020-02-17 | Disposition: A | Discharge status: Home / Self Care | Payer: Self-pay | Attending: Emergency Medicine | Admitting: Emergency Medicine

## 2020-02-17 ENCOUNTER — Other Ambulatory Visit: Payer: Self-pay

## 2020-02-17 DIAGNOSIS — F17210 Nicotine dependence, cigarettes, uncomplicated: Secondary | ICD-10-CM | POA: Insufficient documentation

## 2020-02-17 DIAGNOSIS — E860 Dehydration: Secondary | ICD-10-CM

## 2020-02-17 DIAGNOSIS — R112 Nausea with vomiting, unspecified: Secondary | ICD-10-CM | POA: Insufficient documentation

## 2020-02-17 LAB — COMPREHENSIVE METABOLIC PANEL
ALT: 55 U/L — ABNORMAL HIGH (ref 0–44)
AST: 59 U/L — ABNORMAL HIGH (ref 15–41)
Albumin: 5.2 g/dL — ABNORMAL HIGH (ref 3.5–5.0)
Alkaline Phosphatase: 75 U/L (ref 38–126)
Anion gap: 19 — ABNORMAL HIGH (ref 5–15)
BUN: 13 mg/dL (ref 6–20)
CO2: 20 mmol/L — ABNORMAL LOW (ref 22–32)
Calcium: 9.9 mg/dL (ref 8.9–10.3)
Chloride: 98 mmol/L (ref 98–111)
Creatinine, Ser: 0.75 mg/dL (ref 0.61–1.24)
GFR calc Af Amer: >60 mL/min (ref >60)
GFR calc non Af Amer: >60 mL/min (ref >60)
Glucose, Bld: 101 mg/dL — ABNORMAL HIGH (ref 70–99)
Potassium: 4.3 mmol/L (ref 3.5–5.1)
Sodium: 137 mmol/L (ref 135–145)
Total Bilirubin: 1.2 mg/dL (ref 0.3–1.2)
Total Protein: 9.2 g/dL — ABNORMAL HIGH (ref 6.5–8.1)

## 2020-02-17 LAB — CBC
HCT: 48.7 % (ref 39.0–52.0)
Hemoglobin: 17.3 g/dL — ABNORMAL HIGH (ref 13.0–17.0)
MCH: 32.8 pg (ref 26.0–34.0)
MCHC: 35.5 g/dL (ref 30.0–36.0)
MCV: 92.4 fL (ref 80.0–100.0)
Platelets: 299 10*3/uL (ref 150–400)
RBC: 5.27 MIL/uL (ref 4.22–5.81)
RDW: 13.1 % (ref 11.5–15.5)
WBC: 11.7 10*3/uL — ABNORMAL HIGH (ref 4.0–10.5)
nRBC: 0 % (ref 0.0–0.2)

## 2020-02-17 LAB — LIPASE, BLOOD: Lipase: 20 U/L (ref 11–51)

## 2020-02-17 MED ORDER — ONDANSETRON 4 MG PO TBDP
4.0000 mg | ORAL_TABLET | Freq: Once | ORAL | Status: AC | PRN
  Administered 2020-02-17: 14:00:00 4 mg via ORAL
  Filled 2020-02-17: qty 1

## 2020-02-17 MED ORDER — FAMOTIDINE IN NACL 20-0.9 MG/50ML-% IV SOLN
20.0000 mg | Freq: Once | INTRAVENOUS | Status: AC
  Administered 2020-02-17: 14:00:00 20 mg via INTRAVENOUS
  Filled 2020-02-17: qty 50

## 2020-02-17 MED ORDER — ONDANSETRON HCL 4 MG/2ML IJ SOLN
4.0000 mg | Freq: Once | INTRAMUSCULAR | Status: AC
  Administered 2020-02-17: 14:00:00 4 mg via INTRAVENOUS
  Filled 2020-02-17: qty 2

## 2020-02-17 MED ORDER — SODIUM CHLORIDE 0.9 % IV BOLUS: 1000.0000 mL | Freq: Once | INTRAVENOUS | Status: DC

## 2020-02-17 MED ORDER — SODIUM CHLORIDE 0.9% FLUSH: 3.0000 mL | Freq: Once | INTRAVENOUS | Status: DC

## 2020-02-17 MED ORDER — LORAZEPAM 2 MG/ML IJ SOLN
1.0000 mg | Freq: Once | INTRAMUSCULAR | Status: AC
  Administered 2020-02-17: 1 mg via INTRAVENOUS
  Filled 2020-02-17: qty 1

## 2020-02-17 MED ORDER — SODIUM CHLORIDE 0.9 % IV BOLUS
1000.0000 mL | Freq: Once | INTRAVENOUS | Status: AC
  Administered 2020-02-17: 14:00:00 1000 mL via INTRAVENOUS

## 2020-02-17 MED ORDER — METOCLOPRAMIDE HCL 10 MG PO TABS: 10.0000 mg | ORAL_TABLET | Freq: Four times a day (QID) | ORAL | 0 refills | Status: DC | PRN

## 2020-02-17 NOTE — ED Triage Notes (Addendum)
Pt to ER states he woke this AM around 4:30 with vomiting.  Pt states unable to keep anything down at this time.  States minimal PO intake today.  Pt states after arriving here he began to have chest tightness and his left arm is numb and he is dizzy. Pt is hyperventilating in triage.

## 2020-02-17 NOTE — ED Provider Notes (Signed)
  Physical Exam  BP 134/79 (BP Location: Left Arm)   Pulse 98   Temp 98 F (36.7 C) (Oral)   Resp 17   Ht 5\' 9"  (1.753 m)   Wt 68.5 kg   SpO2 98%   BMI 22.30 kg/m   Physical Exam  ED Course/Procedures     Procedures  MDM  Patient care assumed at 3 PM.  Patient is here with vomiting.  Patient was noted to have an anion gap acidosis of 19.  However he definitely denies any overdose.  He received IV fluids and signout pending reassessment.  4:25 PM Tolerated p.o. in the ED.  He felt much better now.  I think that anion gap acidosis likely from dehydration.  Will discharge home with Reglan as needed.  Patient has a history of alcohol abuse.  He is taking Nexium already.     , MD 02/17/20 [phone]

## 2020-02-17 NOTE — ED Notes (Signed)
Pt provided water to drink, informed to sip slowly. Pt verbalized understanding.

## 2020-02-17 NOTE — Discharge Instructions (Signed)
Stay hydrated.   Continue nexium  Rest today and tomorrow   See your doctor   Return to ER if you have worse vomiting, abdominal pain, fever

## 2020-02-17 NOTE — ED Provider Notes (Signed)
[facility] Emergency Department Provider Note ____________________________________________   First MD Initiated Contact with Patient 02/17/20 1356     (approximate)  I have reviewed the triage vital signs and the nursing notes.   HISTORY  Chief Complaint Emesis    HPI Jerol Rufener Parekh is a 34 y.o. male with PMH as noted below presents with acute onset of nausea and vomiting around 4:30 AM, persistent course since then, nonbloody and nonbilious, and not associated with abdominal pain.  He does report some chest tightness.  He has had no fever or chills.  The patient denies eating anything unusual.  He states that he uses a THC product although has used it for about 3 months and has not had any nausea or vomiting previously.  He states he takes prescribed Valium and tried to take one today.  He denies any illicit drugs currently.  Past Medical History:  Diagnosis Date  . Bipolar 1 disorder ([place])   . Chronic headache   . Hep C w/o coma, chronic ([place])   . Horseshoe kidney     Patient Active Problem List   Diagnosis Date Noted  . Bipolar 1 disorder, depressed ([place]) 01/31/2018  . Bipolar 1 disorder, mixed, severe ([place]) 04/22/2016  . Alcohol use disorder, mild, abuse 04/22/2016  . Suicidal ideation 04/22/2016  . Tobacco use disorder 04/22/2016  . Substance or medication-induced anxiety disorder ([place]) 04/21/2016    Past Surgical History:  Procedure Laterality Date  . TONSILLECTOMY      Prior to Admission medications   Medication Sig Start Date End Date Taking? Authorizing Provider  diazepam (VALIUM) 5 MG tablet Take 2.5 mg by mouth every 12 (twelve) hours as needed for anxiety.     [provider]  Ledipasvir-Sofosbuvir (HARVONI) 90-400 MG TABS Take by mouth 1 day or 1 dose.    [provider]  QUEtiapine (SEROQUEL) 50 MG tablet Take 1 tablet (50 mg total) by mouth at bedtime. 01/31/18   Clapacs, Madie Reno, MD  traZODone (DESYREL) 50  MG tablet Take 25 mg by mouth at bedtime.    [provider]    Allergies Geodon [ziprasidone hcl]  Family History  Problem Relation Age of Onset  . Hypertension Mother   . Diabetes Mother   . Hypertension Father   . Heart failure Father   . Diabetes Father     Social History Social History   Tobacco Use  . Smoking status: Current Every Day Smoker    Packs/day: 1.50    Types: Cigarettes  . Smokeless tobacco: Current User    Types: Chew  Substance Use Topics  . Alcohol use: Yes  . Drug use: No    Review of Systems  Constitutional: No fever. Eyes: No redness. ENT: No sore throat. Cardiovascular: Denies chest pain. Respiratory: Denies shortness of breath. Gastrointestinal: Positive for nausea and vomiting. Genitourinary: Negative for flank pain.  Musculoskeletal: Negative for back pain. Skin: Negative for rash. Neurological: Negative for headache.   ____________________________________________   PHYSICAL EXAM:  VITAL SIGNS: ED Triage Vitals  Enc Vitals Group     BP 02/17/20 1350 (!) 157/115     Pulse Rate 02/17/20 1350 (!) 124     Resp 02/17/20 1350 (!) 26     Temp 02/17/20 1350 98 F (36.7 C)     Temp src --      SpO2 02/17/20 1350 95 %     Weight 02/17/20 1351 151 lb (68.5 kg)  Height 02/17/20 1351 5\' 9"  (1.753 m)     Head Circumference --      Peak Flow --      Pain Score 02/17/20 1351 7     Pain Loc --      Pain Edu? --      Excl. in GC? --     Constitutional: Alert and oriented.  Uncomfortable appearing but in no acute distress. Eyes: Conjunctivae are normal.  No scleral icterus. Head: Atraumatic. Nose: No congestion/rhinnorhea. Mouth/Throat: Mucous membranes are moist.   Neck: Normal range of motion.  Cardiovascular: Good peripheral circulation. Respiratory: Normal respiratory effort.  No retractions.  Gastrointestinal: Soft and nontender. No distention.  Genitourinary: No flank tenderness. Musculoskeletal:  Extremities warm  and well perfused.  Neurologic:  Normal speech and language. No gross focal neurologic deficits are appreciated.  Skin:  Skin is warm and dry. No rash noted. Psychiatric: Anxious appearing.  ____________________________________________   LABS (all labs ordered are listed, but only abnormal results are displayed)  Labs Reviewed  COMPREHENSIVE METABOLIC PANEL - Abnormal; Notable for the following components:      Result Value   CO2 20 (*)    Glucose, Bld 101 (*)    Total Protein 9.2 (*)    Albumin 5.2 (*)    AST 59 (*)    ALT 55 (*)    Anion gap 19 (*)    All other components within normal limits  CBC - Abnormal; Notable for the following components:   WBC 11.7 (*)    Hemoglobin 17.3 (*)    All other components within normal limits  LIPASE, BLOOD  URINALYSIS, COMPLETE (UACMP) WITH MICROSCOPIC  URINE DRUG SCREEN, QUALITATIVE ([facility] ONLY)   ____________________________________________  EKG   ____________________________________________  RADIOLOGY    ____________________________________________   PROCEDURES  Procedure(s) performed: No  Procedures  Critical Care performed: No ____________________________________________   INITIAL IMPRESSION / ASSESSMENT AND PLAN / ED COURSE  Pertinent labs & imaging results that were available during my care of the patient were reviewed by me and considered in my medical decision making (see chart for details).  34 year old male with PMH as noted above including history of polysubstance abuse presents with acute onset of nausea and vomiting around 4:30 AM.  He has some chest discomfort but no associated abdominal pain.  He denies illicit drug use other than THC but states that he has not had any recent change.  On exam, the patient is very uncomfortable appearing and anxious.  He is hyperventilating somewhat.  He is tachycardic, tachypneic, and hypertensive.  However, he is afebrile.  The abdomen is soft and nontender.  Overall  presentation is consistent with gastroenteritis, gastritis, foodborne illness, cyclical vomiting, or cannabinoid related hyperemesis.  I reviewed the past medical records in Epic.  The patient has several ED visits over the last year for unrelated symptoms, but has not presented previously that I can see for vomiting or GI symptoms.  We will give fluids, antiemetic, Pepcid, obtain a lab work-up and reassess.  ----------------------------------------- 3:09 PM on 02/17/2020 -----------------------------------------  The patient appears much more comfortable and has stop vomiting.  He wants to try p.o. liquids.  Lab work-up shows slightly elevated LFTs and an elevated anion gap likely due to the vomiting and dehydration.  He is receiving his fluid bolus.  If he continues to appear well and is tolerating p.o. I anticipate discharge home.  ____________________________________________   FINAL CLINICAL IMPRESSION(S) / ED DIAGNOSES  Final diagnoses:  Non-intractable vomiting  with nausea, unspecified vomiting type      NEW MEDICATIONS STARTED DURING THIS VISIT:  New Prescriptions   No medications on file     Note:  This document was prepared using Dragon voice recognition software and may include unintentional dictation errors.    Dionne Bucy, MD 02/17/20 1510

## 2020-03-11 ENCOUNTER — Emergency Department
Admission: EM | Admit: 2020-03-11 | Discharge: 2020-03-11 | Disposition: A | Discharge status: Home / Self Care | Payer: Self-pay | Attending: Emergency Medicine | Admitting: Emergency Medicine

## 2020-03-11 ENCOUNTER — Other Ambulatory Visit: Payer: Self-pay

## 2020-03-11 DIAGNOSIS — F319 Bipolar disorder, unspecified: Secondary | ICD-10-CM | POA: Diagnosis present

## 2020-03-11 DIAGNOSIS — Y908 Blood alcohol level of 240 mg/100 ml or more: Secondary | ICD-10-CM | POA: Insufficient documentation

## 2020-03-11 DIAGNOSIS — F10929 Alcohol use, unspecified with intoxication, unspecified: Secondary | ICD-10-CM

## 2020-03-11 DIAGNOSIS — F22 Delusional disorders: Secondary | ICD-10-CM | POA: Insufficient documentation

## 2020-03-11 DIAGNOSIS — R45851 Suicidal ideations: Secondary | ICD-10-CM

## 2020-03-11 DIAGNOSIS — F19180 Other psychoactive substance abuse with psychoactive substance-induced anxiety disorder: Secondary | ICD-10-CM | POA: Insufficient documentation

## 2020-03-11 DIAGNOSIS — F17210 Nicotine dependence, cigarettes, uncomplicated: Secondary | ICD-10-CM | POA: Insufficient documentation

## 2020-03-11 DIAGNOSIS — F1110 Opioid abuse, uncomplicated: Secondary | ICD-10-CM | POA: Insufficient documentation

## 2020-03-11 DIAGNOSIS — F19980 Other psychoactive substance use, unspecified with psychoactive substance-induced anxiety disorder: Secondary | ICD-10-CM | POA: Diagnosis present

## 2020-03-11 DIAGNOSIS — F3163 Bipolar disorder, current episode mixed, severe, without psychotic features: Secondary | ICD-10-CM | POA: Diagnosis present

## 2020-03-11 DIAGNOSIS — F17200 Nicotine dependence, unspecified, uncomplicated: Secondary | ICD-10-CM | POA: Diagnosis present

## 2020-03-11 DIAGNOSIS — F1010 Alcohol abuse, uncomplicated: Secondary | ICD-10-CM | POA: Diagnosis present

## 2020-03-11 DIAGNOSIS — R45850 Homicidal ideations: Secondary | ICD-10-CM | POA: Insufficient documentation

## 2020-03-11 LAB — COMPREHENSIVE METABOLIC PANEL
ALT: 52 U/L — ABNORMAL HIGH (ref 0–44)
AST: 58 U/L — ABNORMAL HIGH (ref 15–41)
Albumin: 4.4 g/dL (ref 3.5–5.0)
Alkaline Phosphatase: 69 U/L (ref 38–126)
Anion gap: 11 (ref 5–15)
BUN: 9 mg/dL (ref 6–20)
CO2: 26 mmol/L (ref 22–32)
Calcium: 8.8 mg/dL — ABNORMAL LOW (ref 8.9–10.3)
Chloride: 109 mmol/L (ref 98–111)
Creatinine, Ser: 0.62 mg/dL (ref 0.61–1.24)
GFR calc Af Amer: >60 mL/min (ref >60)
GFR calc non Af Amer: >60 mL/min (ref >60)
Glucose, Bld: 95 mg/dL (ref 70–99)
Potassium: 4.2 mmol/L (ref 3.5–5.1)
Sodium: 146 mmol/L — ABNORMAL HIGH (ref 135–145)
Total Bilirubin: 0.4 mg/dL (ref 0.3–1.2)
Total Protein: 8.4 g/dL — ABNORMAL HIGH (ref 6.5–8.1)

## 2020-03-11 LAB — CBC
HCT: 46.5 % (ref 39.0–52.0)
Hemoglobin: 16.2 g/dL (ref 13.0–17.0)
MCH: 33.3 pg (ref 26.0–34.0)
MCHC: 34.8 g/dL (ref 30.0–36.0)
MCV: 95.5 fL (ref 80.0–100.0)
Platelets: 294 10*3/uL (ref 150–400)
RBC: 4.87 MIL/uL (ref 4.22–5.81)
RDW: 13.6 % (ref 11.5–15.5)
WBC: 7.6 10*3/uL (ref 4.0–10.5)
nRBC: 0 % (ref 0.0–0.2)

## 2020-03-11 LAB — URINE DRUG SCREEN, QUALITATIVE (ARMC ONLY)
Amphetamines, Ur Screen: NOT DETECTED
Barbiturates, Ur Screen: NOT DETECTED
Benzodiazepine, Ur Scrn: POSITIVE — AB
Cannabinoid 50 Ng, Ur ~~LOC~~: POSITIVE — AB
Cocaine Metabolite,Ur ~~LOC~~: NOT DETECTED
MDMA (Ecstasy)Ur Screen: NOT DETECTED
Methadone Scn, Ur: NOT DETECTED
Opiate, Ur Screen: NOT DETECTED
Phencyclidine (PCP) Ur S: NOT DETECTED
Tricyclic, Ur Screen: NOT DETECTED

## 2020-03-11 LAB — ETHANOL: Alcohol, Ethyl (B): 329 mg/dL (ref <10)

## 2020-03-11 LAB — ACETAMINOPHEN LEVEL: Acetaminophen (Tylenol), Serum: <10 ug/mL — ABNORMAL LOW (ref 10–30)

## 2020-03-11 LAB — SALICYLATE LEVEL: Salicylate Lvl: 9.1 mg/dL (ref 7.0–30.0)

## 2020-03-11 MED ORDER — BUPRENORPHINE HCL-NALOXONE HCL 8-2 MG SL SUBL
1.0000 | SUBLINGUAL_TABLET | Freq: Every day | SUBLINGUAL | Status: DC
  Administered 2020-03-11: 1 via SUBLINGUAL
  Filled 2020-03-11: qty 1

## 2020-03-11 MED ORDER — BUPRENORPHINE HCL-NALOXONE HCL 8-2 MG SL SUBL: 1.0000 | SUBLINGUAL_TABLET | Freq: Every day | SUBLINGUAL | Status: DC

## 2020-03-11 MED ORDER — IBUPROFEN 600 MG PO TABS
600.0000 mg | ORAL_TABLET | Freq: Once | ORAL | Status: AC
  Administered 2020-03-11: 600 mg via ORAL
  Filled 2020-03-11: qty 1

## 2020-03-11 MED ORDER — ONDANSETRON 4 MG PO TBDP
4.0000 mg | ORAL_TABLET | Freq: Once | ORAL | Status: AC
  Administered 2020-03-11: 4 mg via ORAL
  Filled 2020-03-11: qty 1

## 2020-03-11 MED ORDER — ONDANSETRON 4 MG PO TBDP
4.0000 mg | ORAL_TABLET | Freq: Once | ORAL | Status: AC
  Administered 2020-03-11: 4 mg via ORAL
  Filled 2020-03-11 (×2): qty 1
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
## 2020-03-11 NOTE — ED Notes (Signed)
Pt states that he is going to smoke and do what he wants to do and refuses to dress out and be taken to a room - pt is agitated and pt left the triage area - first nurse is aware

## 2020-03-11 NOTE — ED Notes (Signed)
Pt was given a cup of lemon lime soda without ice, lid, or straw.

## 2020-03-11 NOTE — ED Triage Notes (Signed)
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

## 2020-03-11 NOTE — Consult Note (Signed)
Patient reassessed on morning of 03/11/2020 at 11:20 AM:  Patient at this time is denying any suicidal or homicidal ideation.  Denies any psychotic symptoms.  He reports being compliant on his medication and denies any acute psychiatric issues.  Patient is able to reasonably explain his presentation is due to intoxication on street drugs.  He denies that he is not going from a trip drugs and is not willing to accept outpatient resources for rehab at this time.  He reports compliance with his psychiatric medications and as well as his outpatient provider.  Patient is not deemed to be a danger to himself or others  Patient is to be discharged back to the community

## 2020-03-11 NOTE — ED Provider Notes (Signed)
[facility] Emergency Department Provider Note  ____________________________________________   First MD Initiated Contact with Patient 03/11/20 [phone]     (approximate)  I have reviewed the triage vital signs and the nursing notes.   HISTORY  Chief Complaint Homicidal    HPI Nathan [name] is a 34 y.o. male with below list of previous medical conditions including bipolar disorder and polysubstance abuse presents to the emergency department secondary to homicidal ideation however patient states no one in particular.  Patient states that he has a history of paranoid schizophrenia and that he has not been taking his medication.  Patient also states that he uses heroin daily and has not done so in the past 2 days.  Patient does admit to alcohol ingestion tonight.        Past Medical History:  Diagnosis Date  . Bipolar 1 disorder (HCC)   . Chronic headache   . 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 Horseshoe kidney     Patient Active Problem List   Diagnosis Date Noted  . Bipolar 1 disorder, depressed (HCC) 01/31/2018  . Bipolar 1 disorder, mixed, severe (HCC) 04/22/2016  . Alcohol use disorder, mild, abuse 04/22/2016  . Suicidal ideation 04/22/2016  . Tobacco use disorder 04/22/2016  . 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 TONSILLECTOMY      Prior to Admission medications   Medication Sig Start Date End Date Taking? Authorizing Provider  diazepam (VALIUM) 5 MG tablet Take 2.5 mg by mouth every 12 (twelve) hours as needed for anxiety.     [provider]  Ledipasvir-Sofosbuvir (HARVONI) 90-400 MG TABS Take by mouth 1 day or 1 dose.    [provider]  metoCLOPramide (REGLAN) 10 MG tablet Take 1 tablet (10 mg total) by mouth every 6 (six) hours as needed for nausea (nausea/headache). 02/17/20   Charlynne Pander, MD  QUEtiapine (SEROQUEL) 50 MG  tablet Take 1 tablet (50 mg total) by mouth at bedtime. 01/31/18   Clapacs, Jackquline Denmark, MD  traZODone (DESYREL) 50 MG tablet Take 25 mg by mouth at bedtime.    [provider]    Allergies Geodon [ziprasidone hcl]  Family History  Problem Relation Age of Onset  . Hypertension Mother   . Diabetes Mother   . Hypertension Father   . Heart failure Father   . Diabetes Father     Social History Social History   Tobacco Use  . Smoking status: Current Every Day Smoker    Packs/day: 1.50    Types: Cigarettes  . Smokeless tobacco: Current User    Types: Chew  Substance Use Topics  . Alcohol use: Yes    Alcohol/week: 6.0 standard drinks    Types: 6 Cans of beer per week  . Drug use: Yes    Types: IV    Comment: heroine    Review of Systems Constitutional: No fever/chills Eyes: No visual changes. ENT: No sore throat. Cardiovascular: Denies chest pain. Respiratory: Denies shortness of breath. Gastrointestinal: No abdominal pain.  No nausea, no vomiting.  No diarrhea.  No constipation. Genitourinary: Negative for dysuria. Musculoskeletal: Negative for neck pain.  Negative for back pain. Integumentary: Negative for rash. Neurological: Negative for headaches, focal weakness or numbness. Psychiatric:  Positive for homicidal ideation, "heroin withdrawal,   ____________________________________________   PHYSICAL EXAM:  VITAL SIGNS: ED Triage Vitals  Enc Vitals Group  BP 03/11/20 0030 137/66     Pulse Rate 03/11/20 0030 96     Resp 03/11/20 0030 16     Temp 03/11/20 0030 98.1 F (36.7 C)     Temp Source 03/11/20 0030 Oral     SpO2 03/11/20 0030 99 %     Weight 03/11/20 0031 73.5 kg (162 lb)     Height 03/11/20 0031 1.753 m (5\' 9" )     Head Circumference --      Peak Flow --      Pain Score 03/11/20 0031 0     Pain Loc --      Pain Edu? --      Excl. in GC? --     Constitutional: Alert and oriented.  Appears intoxicated Eyes: Conjunctivae are normal.    Head: Atraumatic. Mouth/Throat: Patient is wearing a mask. Neck: No stridor.  No meningeal signs.   Cardiovascular: Normal rate, regular rhythm. Good peripheral circulation. Grossly normal heart sounds. Respiratory: Normal respiratory effort.  No retractions. Gastrointestinal: Soft and nontender. No distention.  Musculoskeletal: No lower extremity tenderness nor edema. No gross deformities of extremities. Neurologic:  Normal speech and language. No gross focal neurologic deficits are appreciated.  Skin:  Skin is warm, dry and intact. Psychiatric: Appears intoxicated, occasionally agitated however cooperative with exam  ____________________________________________   LABS (all labs ordered are listed, but only abnormal results are displayed)  Labs Reviewed  COMPREHENSIVE METABOLIC PANEL - Abnormal; Notable for the following components:      Result Value   Sodium 146 (*)    Calcium 8.8 (*)    Total Protein 8.4 (*)    AST 58 (*)    ALT 52 (*)    All other components within normal limits  ETHANOL - Abnormal; Notable for the following components:   Alcohol, Ethyl (B) 329 (*)    All other components within normal limits  ACETAMINOPHEN LEVEL - Abnormal; Notable for the following components:   Acetaminophen (Tylenol), Serum <10 (*)    All other components within normal limits  URINE DRUG SCREEN, QUALITATIVE ([facility] ONLY) - Abnormal; Notable for the following components:   Cannabinoid 50 Ng, Ur [place] POSITIVE (*)    Benzodiazepine, Ur Scrn POSITIVE (*)    All other components within normal limits  SALICYLATE LEVEL  CBC     Procedures   ____________________________________________   INITIAL IMPRESSION / MDM / ASSESSMENT AND PLAN / ED COURSE  As part of my medical decision making, I reviewed the following data within the electronic MEDICAL RECORD NUMBER  34 year old male presented with above-stated history and physical exam awaiting psychiatry consultation and disposition.  Patient is  intoxicated at this time with a EtOH level of 329.  ____________________________________________  FINAL CLINICAL IMPRESSION(S) / ED DIAGNOSES  Final diagnoses:  Alcoholic intoxication with complication (HCC)     MEDICATIONS GIVEN DURING THIS VISIT:  Medications  buprenorphine-naloxone (SUBOXONE) 8-2 mg per SL tablet 1 tablet (1 tablet Sublingual Given 03/11/20 03/13/20)     ED Discharge Orders    None      *Please note:  Nathan [name] was evaluated in Emergency Department on 03/11/2020 for the symptoms described in the history of present illness. He was evaluated in the context of the global COVID-19 pandemic, which necessitated consideration that the patient might be at risk for infection with the SARS-CoV-2 virus that causes COVID-19. Institutional protocols and algorithms that pertain to the evaluation of patients at risk for COVID-19 are in a state of  rapid change based on information released by regulatory bodies including the CDC and federal and state organizations. These policies and algorithms were followed during the patient's care in the ED.  Some ED evaluations and interventions may be delayed as a result of limited staffing during the pandemic.*  Note:  This document was prepared using Dragon voice recognition software and may include unintentional dictation errors.   Gregor Hams, MD 03/11/20 [phone]

## 2020-03-11 NOTE — ED Notes (Signed)
Hourly rounding reveals patient awake in room. No complaints, stable, in no acute distress. Q15 minute rounds and monitoring via Security Cameras to continue. 

## 2020-03-11 NOTE — ED Notes (Signed)
Pt. Alert and oriented, warm and dry, in no distress. Pt. Denies SI, and AVH. Pt states he is having HI thoughts but no toward anyone here, states he uses heroin with last used two days ago and he needs some Suboxone. Pt. Encouraged to let nursing staff know of any concerns or needs.

## 2020-03-11 NOTE — ED Notes (Signed)

## 2020-03-11 NOTE — ED Notes (Signed)
Pt. Transferred to B[facility] from ED to room 4  after screening for contraband. Report to include Situation, Background, Assessment and Recommendations from [employer] Pt. Oriented to unit including Q15 minute rounds as well as the security cameras for their protection. Patient is alert and oriented, warm and dry in no acute distress. Patient denies SI, and AVH. Pt. Encouraged to let me know if needs arise.

## 2020-03-11 NOTE — ED Provider Notes (Signed)
Cleared for discharge by psychiatry team   Jene Every, MD 03/11/20 1153

## 2020-03-11 NOTE — ED Notes (Signed)
2 white socks 1 black hanes underwear 1 black pair pants 1 black shirt with red and white writing 1 pair of black shoes 1 black hoodie [employer] with chain with 7 dollars with keys, id card, and bank card 1 ring on left hand

## 2020-03-11 NOTE — ED Notes (Signed)
Hourly rounding reveals patient sleeping in room. No complaints, stable, in no acute distress. Q15 minute rounds and monitoring via Security Cameras to continue. 

## 2020-03-11 NOTE — Consult Note (Signed)
[facility] Face-to-Face Psychiatry Consult   Reason for Consult: Homicidal threats Referring Physician: Dr.Brown Patient Identification: Nathan [name] MRN:  [id number] Principal Diagnosis: Substance or medication-induced anxiety disorder (HCC) Diagnosis:  Principal Problem:   Substance or medication-induced anxiety disorder (HCC) Active Problems:   Bipolar 1 disorder, mixed, severe (HCC)   Alcohol use disorder, mild, abuse   Suicidal ideation   Tobacco use disorder   Bipolar 1 disorder, depressed (HCC)   Total Time spent with patient: 45 minutes  Subjective: "I am a little homicidal.  I was a lot homicidal before I got here." Nathan [name] is a 34 y.o. male  The patient presented to [facility] ED voluntarily and voicing he is homicidal towards certain people.  Per the ED triage nursing note, the patient reports that he is paranoid, schizo, and has not been taking his medications.  He disclosed that he is addicted to heroin and requires Suboxone that he is currently out of and needs.  The patient voiced to this writer, "I have never taken Suboxone before.  I only use heroin, and I love it."  The patient also expressed that he has been working for 20 days straight 10 hours days and is irritated.  He refused to divulge whom his homicidal feeling is directed at.  The patient was seen face-to-face by this provider; chart reviewed and consulted with Dr. Manson Passey on 03/11/2020 due to the patient's care. It was discussed with the EDP that the patient would remain under observation overnight and reassess in the a.m. due to his alcohol level at 329 mg/DL. The patient is alert and oriented x 3, anxious, loud, but cooperative, and mood-congruent with affect on evaluation. The patient does not appear to be responding to internal or external stimuli. The patient is presenting with some delusional thinking. The patient denies auditory or visual hallucinations. The patient denies suicidal or self-harm ideations.   He does admit to homicidal ideations but refused to release the person for who he is having these feelings. The patient is presenting with some psychotic and paranoid behaviors. During an encounter with the patient, he was able to answer questions appropriately.  Plan:  The patient will remain under observation overnight due to his alcohol level being 329 mg/DL and reassess in the a.m. once he is no longer under the influence.  HPI:    Past Psychiatric History:  Bipolar 1 disorder (HCC)   Risk to Self:   No Risk to Others:   Yes (the patient refused to disclose the person) Prior Inpatient Therapy:   Yes Prior Outpatient Therapy:   Yes  Past Medical History:  Past Medical History:  Diagnosis Date  . Bipolar 1 disorder (HCC)   . Chronic headache   . 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 Horseshoe kidney     Past Surgical History:  Procedure Laterality Date  . TONSILLECTOMY     Family History:  Family History  Problem Relation Age of Onset  . Hypertension Mother   . Diabetes Mother   . Hypertension Father   . Heart failure Father   . Diabetes Father    Family Psychiatric  History:  Social History:  Social History   Substance and Sexual Activity  Alcohol Use Yes  . Alcohol/week: 6.0 standard drinks  . Types: 6 Cans of beer per week     Social History   Substance and Sexual Activity  Drug Use Yes  . Types: IV   Comment: heroine  Social History   Socioeconomic History  . Marital status: Single    Spouse name: Not on file  . Number of children: Not on file  . Years of education: Not on file  . Highest education level: Not on file  Occupational History  . Not on file  Tobacco Use  . Smoking status: Current Every Day Smoker    Packs/day: 1.50    Types: Cigarettes  . Smokeless tobacco: Current User    Types: Chew  Substance and Sexual Activity  . Alcohol use: Yes    Alcohol/week: 6.0 standard drinks    Types: 6 Cans of beer per week  . Drug use: Yes     Types: IV    Comment: heroine  . Sexual activity: Yes  Other Topics Concern  . Not on file  Social History Narrative  . Not on file   Social Determinants of Health   Financial Resource Strain:   . Difficulty of Paying Living Expenses:   Food Insecurity:   . Worried About Charity fundraiser in the Last Year:   . Arboriculturist in the Last Year:   Transportation Needs:   . Film/video editor (Medical):   Marland Kitchen Lack of Transportation (Non-Medical):   Physical Activity:   . Days of Exercise per Week:   . Minutes of Exercise per Session:   Stress:   . Feeling of Stress :   Social Connections:   . Frequency of Communication with Friends and Family:   . Frequency of Social Gatherings with Friends and Family:   . Attends Religious Services:   . Active Member of Clubs or Organizations:   . Attends Archivist Meetings:   Marland Kitchen Marital Status:    Additional Social History:    Allergies:   Allergies  Allergen Reactions  . Geodon [Ziprasidone Hcl] Other (See Comments)    Seizures    Labs:  Results for orders placed or performed during the hospital encounter of 03/11/20 (from the past 48 hour(s))  Comprehensive metabolic panel     Status: Abnormal   Collection Time: 03/11/20 12:33 AM  Result Value Ref Range   Sodium 146 (H) 135 - 145 mmol/L   Potassium 4.2 3.5 - 5.1 mmol/L   Chloride 109 98 - 111 mmol/L   CO2 26 22 - 32 mmol/L   Glucose, Bld 95 70 - 99 mg/dL    Comment: Glucose reference range applies only to samples taken after fasting for at least 8 hours.   BUN 9 6 - 20 mg/dL   Creatinine, Ser 0.62 0.61 - 1.24 mg/dL   Calcium 8.8 (L) 8.9 - 10.3 mg/dL   Total Protein 8.4 (H) 6.5 - 8.1 g/dL   Albumin 4.4 3.5 - 5.0 g/dL   AST 58 (H) 15 - 41 U/L   ALT 52 (H) 0 - 44 U/L   Alkaline Phosphatase 69 38 - 126 U/L   Total Bilirubin 0.4 0.3 - 1.2 mg/dL   GFR calc non Af Amer >60 >60 mL/min   GFR calc Af Amer >60 >60 mL/min   Anion gap 11 5 - 15    Comment: Performed  at [facility], [address], [place], [place] [address]  Ethanol     Status: Abnormal   Collection Time: 03/11/20 12:33 AM  Result Value Ref Range   Alcohol, Ethyl (B) 329 (HH) <10 mg/dL    Comment: CRITICAL RESULT CALLED TO, READ BACK BY AND VERIFIED WITH SHERIE ALLISON AT  0159 ON 03/11/20 RWW (NOTE) Lowest detectable limit for serum alcohol is 10 mg/dL. For medical purposes only. Performed at [facility], [address] [place], [place] [address]   Salicylate level     Status: None   Collection Time: 03/11/20 12:33 AM  Result Value Ref Range   Salicylate Lvl 9.1 7.0 - 30.0 mg/dL    Comment: Performed at [facility], [address] [place], [place] [address]  Acetaminophen level     Status: Abnormal   Collection Time: 03/11/20 12:33 AM  Result Value Ref Range   Acetaminophen (Tylenol), Serum <10 (L) 10 - 30 ug/mL    Comment: (NOTE) Therapeutic concentrations vary significantly. A range of 10-30 ug/mL  may be an effective concentration for many patients. However, some  are best treated at concentrations outside of this range. Acetaminophen concentrations >150 ug/mL at 4 hours after ingestion  and >50 ug/mL at 12 hours after ingestion are often associated with  toxic reactions. Performed at [facility], [address] [place], [place] [address]   cbc     Status: None   Collection Time: 03/11/20 12:33 AM  Result Value Ref Range   WBC 7.6 4.0 - 10.5 K/uL   RBC 4.87 4.22 - 5.81 MIL/uL   Hemoglobin 16.2 13.0 - 17.0 g/dL   HCT 30.0 92.3 - 30.0 %   MCV 95.5 80.0 - 100.0 fL   MCH 33.3 26.0 - 34.0 pg   MCHC 34.8 30.0 - 36.0 g/dL   RDW 76.2 26.3 - 33.5 %   Platelets 294 150 - 400 K/uL   nRBC 0.0 0.0 - 0.2 %    Comment: Performed at [facility], [address] [place], [place] [address]    Current Facility-Administered Medications  Medication Dose Route Frequency Provider Last Rate Last Admin  . buprenorphine-naloxone  (SUBOXONE) 8-2 mg per SL tablet 1 tablet  1 tablet Sublingual Daily Darci Current, MD   1 tablet at 03/11/20 0227   Current Outpatient Medications  Medication Sig Dispense Refill  . diazepam (VALIUM) 5 MG tablet Take 2.5 mg by mouth every 12 (twelve) hours as needed for anxiety.     . Ledipasvir-Sofosbuvir (HARVONI) 90-400 MG TABS Take by mouth 1 day or 1 dose.    . metoCLOPramide (REGLAN) 10 MG tablet Take 1 tablet (10 mg total) by mouth every 6 (six) hours as needed for nausea (nausea/headache). 10 tablet 0  . QUEtiapine (SEROQUEL) 50 MG tablet Take 1 tablet (50 mg total) by mouth at bedtime. 30 tablet 1  . traZODone (DESYREL) 50 MG tablet Take 25 mg by mouth at bedtime.      Musculoskeletal: Strength & Muscle Tone: within normal limits Gait & Station: normal Patient leans: N/A  Psychiatric Specialty Exam: Physical Exam  Nursing note and vitals reviewed. Constitutional: He is oriented to person, place, and time. He appears well-developed and well-nourished.  Cardiovascular: Normal rate.  Respiratory: Effort normal.  Musculoskeletal:        General: Normal range of motion.     Cervical back: Normal range of motion and neck supple.  Neurological: He is alert and oriented to person, place, and time.    Review of Systems  Blood pressure 137/66, pulse 96, temperature 98.1 F (36.7 C), temperature source Oral, resp. rate 16, height 5\' 9"  (1.753 m), weight 73.5 kg, SpO2 99 %.Body mass index is 23.92 kg/m.  General Appearance: Disheveled  Eye Contact:  Good  Speech:  Clear and Coherent  and Pressured  Volume:  Increased  Mood:  Anxious and Irritable  Affect:  Congruent, Inappropriate and Full Range  Thought Process:  Coherent and Descriptions of Associations: Tangential  Orientation:  Full (Time, Place, and Person)  Thought Content:  Logical, Paranoid Ideation and Rumination  Suicidal Thoughts:  No  Homicidal Thoughts:  Yes.  without intent/plan  Memory:  Immediate;    Good Recent;   Good Remote;   Good  Judgement:  Poor  Insight:  Lacking  Psychomotor Activity:  Increased  Concentration:  Concentration: Fair and Attention Span: Fair  Recall:  [employer] of Knowledge:  Fair  Language:  Fair  Akathisia:  Negative  Handed:  Right  AIMS (if indicated):     Assets:  Communication Skills Desire for Improvement Resilience Social Support  ADL's:  Intact  Cognition:  WNL  Sleep:    Insomnia     Treatment Plan Summary: Daily contact with patient to assess and evaluate symptoms and progress in treatment, Plan The patient will remain under observation overnight due to his alcohol level being 329 mg/DL and reassess in the a.m. once he is no longer under the influence. and .  Disposition: Patient does not meet criteria for psychiatric inpatient admission. The patient will remain under observation overnight and reassess in the a.m. to determine if he meets criteria for psychiatric inpatient admission or could be discharged home.  Gillermo Murdoch, NP 03/11/2020 2:35 AM

## 2020-03-26 ENCOUNTER — Emergency Department
Admission: EM | Admit: 2020-03-26 | Discharge: 2020-03-27 | Disposition: A | Discharge status: Home / Self Care | Payer: Self-pay | Attending: Emergency Medicine | Admitting: Emergency Medicine

## 2020-03-26 ENCOUNTER — Other Ambulatory Visit: Payer: Self-pay

## 2020-03-26 DIAGNOSIS — Z79899 Other long term (current) drug therapy: Secondary | ICD-10-CM | POA: Insufficient documentation

## 2020-03-26 DIAGNOSIS — F1010 Alcohol abuse, uncomplicated: Secondary | ICD-10-CM | POA: Diagnosis present

## 2020-03-26 DIAGNOSIS — F19980 Other psychoactive substance use, unspecified with psychoactive substance-induced anxiety disorder: Secondary | ICD-10-CM | POA: Diagnosis present

## 2020-03-26 DIAGNOSIS — F319 Bipolar disorder, unspecified: Secondary | ICD-10-CM | POA: Diagnosis present

## 2020-03-26 DIAGNOSIS — Z20822 Contact with and (suspected) exposure to covid-19: Secondary | ICD-10-CM | POA: Insufficient documentation

## 2020-03-26 DIAGNOSIS — R45851 Suicidal ideations: Secondary | ICD-10-CM

## 2020-03-26 DIAGNOSIS — F3163 Bipolar disorder, current episode mixed, severe, without psychotic features: Secondary | ICD-10-CM | POA: Diagnosis present

## 2020-03-26 DIAGNOSIS — F17200 Nicotine dependence, unspecified, uncomplicated: Secondary | ICD-10-CM | POA: Diagnosis present

## 2020-03-26 DIAGNOSIS — F17210 Nicotine dependence, cigarettes, uncomplicated: Secondary | ICD-10-CM | POA: Insufficient documentation

## 2020-03-26 DIAGNOSIS — F10180 Alcohol abuse with alcohol-induced anxiety disorder: Secondary | ICD-10-CM | POA: Insufficient documentation

## 2020-03-26 DIAGNOSIS — F1094 Alcohol use, unspecified with alcohol-induced mood disorder: Secondary | ICD-10-CM

## 2020-03-26 LAB — COMPREHENSIVE METABOLIC PANEL
ALT: 65 U/L — ABNORMAL HIGH (ref 0–44)
AST: 80 U/L — ABNORMAL HIGH (ref 15–41)
Albumin: 4.5 g/dL (ref 3.5–5.0)
Alkaline Phosphatase: 77 U/L (ref 38–126)
Anion gap: 10 (ref 5–15)
BUN: 8 mg/dL (ref 6–20)
CO2: 24 mmol/L (ref 22–32)
Calcium: 9.1 mg/dL (ref 8.9–10.3)
Chloride: 104 mmol/L (ref 98–111)
Creatinine, Ser: 0.62 mg/dL (ref 0.61–1.24)
GFR calc Af Amer: >60 mL/min (ref >60)
GFR calc non Af Amer: >60 mL/min (ref >60)
Glucose, Bld: 102 mg/dL — ABNORMAL HIGH (ref 70–99)
Potassium: 4.2 mmol/L (ref 3.5–5.1)
Sodium: 138 mmol/L (ref 135–145)
Total Bilirubin: 0.6 mg/dL (ref 0.3–1.2)
Total Protein: 8 g/dL (ref 6.5–8.1)

## 2020-03-26 LAB — CBC
HCT: 45.5 % (ref 39.0–52.0)
Hemoglobin: 15.8 g/dL (ref 13.0–17.0)
MCH: 32.9 pg (ref 26.0–34.0)
MCHC: 34.7 g/dL (ref 30.0–36.0)
MCV: 94.8 fL (ref 80.0–100.0)
Platelets: 201 10*3/uL (ref 150–400)
RBC: 4.8 MIL/uL (ref 4.22–5.81)
RDW: 13.6 % (ref 11.5–15.5)
WBC: 6.2 10*3/uL (ref 4.0–10.5)
nRBC: 0 % (ref 0.0–0.2)

## 2020-03-26 LAB — PHOSPHORUS: Phosphorus: 4.5 mg/dL (ref 2.5–4.6)

## 2020-03-26 LAB — SALICYLATE LEVEL: Salicylate Lvl: <7 mg/dL — ABNORMAL LOW (ref 7.0–30.0)

## 2020-03-26 LAB — MAGNESIUM: Magnesium: 2.2 mg/dL (ref 1.7–2.4)

## 2020-03-26 LAB — ETHANOL: Alcohol, Ethyl (B): 298 mg/dL — ABNORMAL HIGH (ref <10)

## 2020-03-26 LAB — ACETAMINOPHEN LEVEL: Acetaminophen (Tylenol), Serum: <10 ug/mL — ABNORMAL LOW (ref 10–30)

## 2020-03-26 MED ORDER — FAMOTIDINE 20 MG PO TABS
20.0000 mg | ORAL_TABLET | Freq: Once | ORAL | Status: AC
  Administered 2020-03-26: 20 mg via ORAL
  Filled 2020-03-26: qty 1

## 2020-03-26 MED ORDER — DIPHENHYDRAMINE HCL 50 MG/ML IJ SOLN: 50.0000 mg | Freq: Once | INTRAMUSCULAR | Status: DC

## 2020-03-26 MED ORDER — PROMETHAZINE HCL 25 MG PO TABS: 25.0000 mg | ORAL_TABLET | Freq: Four times a day (QID) | ORAL | Status: DC | PRN

## 2020-03-26 MED ORDER — LORAZEPAM 2 MG/ML IJ SOLN: 2.0000 mg | Freq: Once | INTRAMUSCULAR | Status: DC

## 2020-03-26 MED ORDER — FOLIC ACID 1 MG PO TABS
1.0000 mg | ORAL_TABLET | Freq: Every day | ORAL | Status: DC
  Administered 2020-03-26: 1 mg via ORAL
  Filled 2020-03-26: qty 1

## 2020-03-26 MED ORDER — THIAMINE HCL 100 MG PO TABS
100.0000 mg | ORAL_TABLET | Freq: Every day | ORAL | Status: DC
  Administered 2020-03-26: 100 mg via ORAL
  Filled 2020-03-26: qty 1

## 2020-03-26 MED ORDER — LORAZEPAM 1 MG PO TABS
1.0000 mg | ORAL_TABLET | ORAL | Status: DC | PRN
  Administered 2020-03-26: 2 mg via ORAL
  Filled 2020-03-26: qty 2

## 2020-03-26 MED ORDER — THIAMINE HCL 100 MG/ML IJ SOLN: 100.0000 mg | Freq: Every day | INTRAMUSCULAR | Status: DC

## 2020-03-26 MED ORDER — ADULT MULTIVITAMIN W/MINERALS CH
1.0000 | ORAL_TABLET | Freq: Every day | ORAL | Status: DC
  Administered 2020-03-26: 1 via ORAL
  Filled 2020-03-26: qty 1

## 2020-03-26 MED ORDER — LORAZEPAM 2 MG/ML IJ SOLN: 1.0000 mg | INTRAMUSCULAR | Status: DC | PRN

## 2020-03-26 NOTE — ED Provider Notes (Signed)
[facility] Emergency Department Provider Note   ____________________________________________   First MD Initiated Contact with Patient 03/26/20 2003     (approximate)  I have reviewed the triage vital signs and the nursing notes.   HISTORY  Chief Complaint Mental Health Problem    HPI Nathan [name] is a 34 y.o. male here for evaluation because of his alcohol use  Patient reports he drinks heavily.  He has been dealing with pancreatitis which she was diagnosed with but he knows there is no hope for his life because he will never stop drinking.  He reports tonight that he called for help.  Please officers report the patient was made decaying threatening statements, even at one point trying to induce the police officers to shoot him  Patient reports that he is not have any pain or discomfort.  He has been drinking heavily which she does every day and has no intent to quit.  Last used heroin yesterday  Patient denies homicidal ideation.  Patient does report is been contemplating suicide because of his incurable pancreatitis   Past Medical History:  Diagnosis Date  . Bipolar 1 disorder ([place])   . Chronic headache   . Hep C w/o coma, chronic ([place])   . Horseshoe kidney     Patient Active Problem List   Diagnosis Date Noted  . Bipolar 1 disorder, depressed ([place]) 01/31/2018  . Bipolar 1 disorder, mixed, severe ([place]) 04/22/2016  . Alcohol use disorder, mild, abuse 04/22/2016  . Suicidal ideation 04/22/2016  . Tobacco use disorder 04/22/2016  . 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 TONSILLECTOMY      Prior to Admission medications   Medication Sig Start Date End Date Taking? Authorizing Provider  diazepam (VALIUM) 5 MG tablet Take 2.5 mg by mouth every 12 (twelve) hours as needed for anxiety.     [provider]  Ledipasvir-Sofosbuvir (HARVONI) 90-400  MG TABS Take by mouth 1 day or 1 dose.    [provider]  metoCLOPramide (REGLAN) 10 MG tablet Take 1 tablet (10 mg total) by mouth every 6 (six) hours as needed for nausea (nausea/headache). 02/17/20   Drenda Freeze, MD  QUEtiapine (SEROQUEL) 50 MG tablet Take 1 tablet (50 mg total) by mouth at bedtime. 01/31/18   Clapacs, Madie Reno, MD  traZODone (DESYREL) 50 MG tablet Take 25 mg by mouth at bedtime.    [provider]    Allergies Geodon [ziprasidone hcl]  Family History  Problem Relation Age of Onset  . Hypertension Mother   . Diabetes Mother   . Hypertension Father   . Heart failure Father   . Diabetes Father     Social History Social History   Tobacco Use  . Smoking status: Current Every Day Smoker    Packs/day: 1.50    Types: Cigarettes  . Smokeless tobacco: Current User    Types: Chew  Substance Use Topics  . Alcohol use: Yes    Alcohol/week: 6.0 standard drinks    Types: 6 Cans of beer per week  . Drug use: Yes    Types: IV    Comment: heroine    Review of Systems Constitutional: No fever/chills denies recent illness except he was seen for heroin and drug use few days ago in ER Eyes: No visual changes. ENT: No sore throat. Cardiovascular: Denies chest pain. Respiratory: Denies shortness of breath. Gastrointestinal: No abdominal pain.  Genitourinary: Negative for dysuria. Musculoskeletal: Negative for back pain. Skin: Negative for rash. Neurological: Negative for headaches, areas of focal weakness or numbness.    ____________________________________________   PHYSICAL EXAM:  VITAL SIGNS: ED Triage Vitals [03/26/20 1954]  Enc Vitals Group     BP (!) 144/107     Pulse Rate (!) 112     Resp 17     Temp 98.6 F (37 C)     Temp Source Oral     SpO2 94 %     Weight 165 lb (74.8 kg)     Height 5\' 9"  (1.753 m)     Head Circumference      Peak Flow      Pain Score 0     Pain Loc      Pain Edu?      Excl. in GC?      Constitutional: Alert and oriented. Well appearing and in no acute distress.  He seems somewhat inebriated.  Slight slurring of speech.  Certainly seems to have a smell or odor of alcohol about him.  Somewhat disheveled in his appearance.  Ambulatory with stable gait though. Eyes: Conjunctivae are normal. Head: Atraumatic. Nose: No congestion/rhinnorhea. Mouth/Throat: Mucous membranes are moist. Neck: No stridor.  Cardiovascular: Normal rate, regular rhythm. Grossly normal heart sounds.  Good peripheral circulation. Respiratory: Normal respiratory effort.  No retractions. Lungs CTAB. Gastrointestinal: Soft and nontender. No distention. Musculoskeletal: No lower extremity tenderness nor edema. Neurologic:  Normal speech and language except for some slight slurring of speech. No gross focal neurologic deficits are appreciated.  Skin:  Skin is warm, dry and intact. No rash noted. Psychiatric: Mood and affect are normal.  Reports suicidal ideation  ____________________________________________   LABS (all labs ordered are listed, but only abnormal results are displayed)  Labs Reviewed  COMPREHENSIVE METABOLIC PANEL - Abnormal; Notable for the following components:      Result Value   Glucose, Bld 102 (*)    AST 80 (*)    ALT 65 (*)    All other components within normal limits  ETHANOL - Abnormal; Notable for the following components:   Alcohol, Ethyl (B) 298 (*)    All other components within normal limits  ACETAMINOPHEN LEVEL - Abnormal; Notable for the following components:   Acetaminophen (Tylenol), Serum <10 (*)    All other components within normal limits  SALICYLATE LEVEL - Abnormal; Notable for the following components:   Salicylate Lvl <7.0 (*)    All other components within normal limits  SARS CORONAVIRUS 2 (TAT 6-24 HRS)  CBC  MAGNESIUM  PHOSPHORUS  URINE DRUG SCREEN, QUALITATIVE ([facility] ONLY)    ____________________________________________  EKG   ____________________________________________  RADIOLOGY   ____________________________________________   PROCEDURES  Procedure(s) performed: None  Procedures  Critical Care performed: No  ____________________________________________   INITIAL IMPRESSION / ASSESSMENT AND PLAN / ED COURSE  Pertinent labs & imaging results that were available during my care of the patient were reviewed by me and considered in my medical decision making (see chart for details).   Patient presents for evaluation for mental health crisis including suicidal ideations.  He is under IVC by .  He also admits to heroin use as well as heavy ethanol use.  He does appear intoxicated by clinical exam, await further testing now.  He denies overdose or ingestion.  He is alert ambulatory slightly tachycardic.  Placed on withdrawal protocol, continue to monitor and place consultation to psychiatry  Clinical Course as  of Mar 27 16  Wed Mar 26, 2020  2131 Patient becoming agitated, elevating mood, began to exhibit some violent-like behavior but not actually hurting anyone.  I have ordered medication for calming and sedation.   [MQ]    Clinical Course User Index [MQ] Sharyn Creamer, MD   ----------------------------------------- 12:16 AM on 03/27/2020 -----------------------------------------  Case discussed with Gillermo Murdoch, nurse practitioner.  She will be seen evaluate the patient for psychiatry.  Ongoing ED care signed to Dr. Manson Passey  ____________________________________________   FINAL CLINICAL IMPRESSION(S) / ED DIAGNOSES  Final diagnoses:  Alcohol abuse        Note:  This document was prepared using Dragon voice recognition software and may include unintentional dictation errors       Sharyn Creamer, MD 03/27/20 0017

## 2020-03-26 NOTE — ED Notes (Signed)
NP and TTS talking to patient in room.

## 2020-03-26 NOTE — ED Notes (Addendum)
Given snack tray and something to drink

## 2020-03-26 NOTE — ED Notes (Signed)

## 2020-03-26 NOTE — ED Triage Notes (Signed)
Pt arrives to ED via [facility] under IVC. Law enforcement states pt reported he "has a medical condition" and "doesn't care if he died tonight". Pt admits to being "drunk as fuck"; last Heroin use was yesterday. Pt is alert, acting appropriate, in NAD; RR even, regular, and unlabored.

## 2020-03-26 NOTE — ED Notes (Signed)
Pt. Introduced to unit.  Pt. Requested and was given meal tray and drink.  Pt. Calm and cooperative at this time.  Pt. Advised of cameras, safety checks and bathroom use.

## 2020-03-26 NOTE — ED Notes (Signed)
Pt. Alert and oriented, warm and dry, in no distress. Pt. Denies SI, HI, and AVH. Pt. Encouraged to let nursing staff know of any concerns or needs. 

## 2020-03-27 LAB — URINE DRUG SCREEN, QUALITATIVE (ARMC ONLY)
Amphetamines, Ur Screen: NOT DETECTED
Barbiturates, Ur Screen: NOT DETECTED
Benzodiazepine, Ur Scrn: POSITIVE — AB
Cannabinoid 50 Ng, Ur ~~LOC~~: POSITIVE — AB
Cocaine Metabolite,Ur ~~LOC~~: NOT DETECTED
MDMA (Ecstasy)Ur Screen: NOT DETECTED
Methadone Scn, Ur: NOT DETECTED
Opiate, Ur Screen: NOT DETECTED
Phencyclidine (PCP) Ur S: NOT DETECTED
Tricyclic, Ur Screen: NOT DETECTED

## 2020-03-27 LAB — SARS CORONAVIRUS 2 (TAT 6-24 HRS): SARS Coronavirus 2: NEGATIVE

## 2020-03-27 NOTE — Consult Note (Signed)
[facility] Face-to-Face Psychiatry Consult   Reason for Consult: Mental health problem Referring provider: Dr. Fanny Bien Patient Identification: Nathan [name] MRN:  [id number] Principal Diagnosis: <principal problem not specified> Diagnosis:  Active Problems:   Substance or medication-induced anxiety disorder (HCC)   Bipolar 1 disorder, mixed, severe (HCC)   Alcohol use disorder, mild, abuse   Suicidal ideation   Tobacco use disorder   Bipolar 1 disorder, depressed (HCC)   Total Time spent with patient: 45 minutes  Subjective: "I am a little homicidal.  I was a lot homicidal before I got here." Carlin Attridge is a 34 y.o. male presented to [facility] ED voluntarily and voicing he is homicidal towards certain people. He stated, "I have a medical condition" and "doesn't care if I died tonight." The patient voiced he had been drinking. The patient discussed his polysubstance use and expressed he used heroin two days ago.  The patient voice his anger towards his living situation.  He states he lives outside in a tent, but he is the only person that currently works in the household.  He narrated that he forgot it was his birthday the other day, his 38-year-old son ran into the bathroom and told him a happy birthday, daddy, and he forgot all about it being his birthday. 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 The patient reported that he has been clean from Heroin "for two days." The patient admitted to his continuous use of alcohol.  His blood-alcohol level is 298 mg/DL. The patient voiced that he was feeling suicidal this a.m. (04.07.21) and did not verbalize to the police to kill him. The patient was seen face-to-face by this provider; chart reviewed and consulted with Dr. Fanny Bien on 03/26/2020 due to the patient's care. It was discussed with the EDP that the patient would remain under observation overnight and reassess  in the a.m. to determine if he meets the criteria to be admitted to the psychiatric inpatient unit or be discharged home.   The patient is alert and oriented x4, anxious, pressured speech, irritated, cooperative, and mood-congruent with affect.  The patient does appear to be responding to external stimuli. Generalized scratching of his body. Stating, "it is because I am coming off the heroin."  The patient is neither presenting with any delusional thinking. The patient denies auditory or visual hallucinations. The patient denies any suicidal, homicidal, or self-harm ideations. The patient is not presenting with any psychotic or paranoid behaviors. During an encounter with the patient, he was able to answer questions appropriately.    Plan:  The patient will remain under observation overnight due to his alcohol level being 329 mg/DL and reassess in the a.m. once he is no longer under the influence.  HPI:  Per Dr. Fanny Bien: Anselmo Rod Cogdell is a 34 y.o. male here for evaluation because of his alcohol use Patient reports he drinks heavily.  He has been dealing with pancreatitis which she was diagnosed with but he knows there is no hope for his life because he will never stop drinking.  He reports tonight that he called for help.  Please officers report the patient was made decaying threatening statements, even at one point trying to induce the police officers to shoot him  Patient reports that he is not have any pain or discomfort.  He has been drinking heavily which she does every day and has no intent to quit.  Last used heroin yesterday  Patient denies homicidal ideation.  Patient does report is been contemplating suicide because of his incurable pancreatitis   Past Psychiatric History:  Bipolar 1 disorder ([place])   Risk to Self: Suicidal Ideation: Yes-Currently Present Suicidal Intent: Yes-Currently Present Is patient at risk for suicide?: No Suicidal Plan?: No Access to Means: No What has  been your use of drugs/alcohol within the last 12 months?: Alcohol, Heroin How many times?: 0 Triggers for Past Attempts: None known Intentional Self Injurious Behavior: None No Risk to Others: Homicidal Ideation: No Thoughts of Harm to Others: No Current Homicidal Intent: No Current Homicidal Plan: No Access to Homicidal Means: No History of harm to others?: No Assessment of Violence: None Noted Does patient have access to weapons?: No Criminal Charges Pending?: No Does patient have a court date: No Yes (the patient refused to disclose the person) Prior Inpatient Therapy: Prior Inpatient Therapy: No Yes Prior Outpatient Therapy: Prior Outpatient Therapy: No Does patient have an ACCT team?: No Does patient have Intensive In-House Services?  : No Does patient have [facility] services? : No Does patient have P4CC services?: No Yes  Past Medical History:  Past Medical History:  Diagnosis Date  . Bipolar 1 disorder ([place])   . Chronic headache   . Hep C w/o coma, chronic ([place])   . Horseshoe kidney     Past Surgical History:  Procedure Laterality Date  . TONSILLECTOMY     Family History:  Family History  Problem Relation Age of Onset  . Hypertension Mother   . Diabetes Mother   . Hypertension Father   . Heart failure Father   . Diabetes Father    Family Psychiatric  History:  Social History:  Social History   Substance and Sexual Activity  Alcohol Use Yes  . Alcohol/week: 6.0 standard drinks  . Types: 6 Cans of beer per week     Social History   Substance and Sexual Activity  Drug Use Yes  . Types: IV   Comment: heroine    Social History   Socioeconomic History  . Marital status: Single    Spouse name: Not on file  . Number of children: Not on file  . Years of education: Not on file  . Highest education level: Not on file  Occupational History  . Not on file  Tobacco Use  . Smoking status: Current Every Day Smoker    Packs/day: 1.50    Types: Cigarettes   . Smokeless tobacco: Current User    Types: Chew  Substance and Sexual Activity  . Alcohol use: Yes    Alcohol/week: 6.0 standard drinks    Types: 6 Cans of beer per week  . Drug use: Yes    Types: IV    Comment: heroine  . Sexual activity: Yes  Other Topics Concern  . Not on file  Social History Narrative  . Not on file   Social Determinants of Health   Financial Resource Strain:   . Difficulty of Paying Living Expenses:   Food Insecurity:   . Worried About Charity fundraiser in the Last Year:   . Arboriculturist in the Last Year:   Transportation Needs:   . Film/video editor (Medical):   Marland Kitchen Lack of Transportation (Non-Medical):   Physical Activity:   . Days of Exercise per Week:   . Minutes of Exercise per Session:   Stress:   . Feeling of Stress :   Social Connections:   . Frequency of  Communication with Friends and Family:   . Frequency of Social Gatherings with Friends and Family:   . Attends Religious Services:   . Active Member of Clubs or Organizations:   . Attends Banker Meetings:   Marland Kitchen Marital Status:    Additional Social History:    Allergies:   Allergies  Allergen Reactions  . Geodon [Ziprasidone Hcl] Other (See Comments)    Seizures    Labs:  Results for orders placed or performed during the hospital encounter of 03/26/20 (from the past 48 hour(s))  Comprehensive metabolic panel     Status: Abnormal   Collection Time: 03/26/20  7:56 PM  Result Value Ref Range   Sodium 138 135 - 145 mmol/L   Potassium 4.2 3.5 - 5.1 mmol/L   Chloride 104 98 - 111 mmol/L   CO2 24 22 - 32 mmol/L   Glucose, Bld 102 (H) 70 - 99 mg/dL    Comment: Glucose reference range applies only to samples taken after fasting for at least 8 hours.   BUN 8 6 - 20 mg/dL   Creatinine, Ser 7.06 0.61 - 1.24 mg/dL   Calcium 9.1 8.9 - 23.7 mg/dL   Total Protein 8.0 6.5 - 8.1 g/dL   Albumin 4.5 3.5 - 5.0 g/dL   AST 80 (H) 15 - 41 U/L   ALT 65 (H) 0 - 44 U/L    Alkaline Phosphatase 77 38 - 126 U/L   Total Bilirubin 0.6 0.3 - 1.2 mg/dL   GFR calc non Af Amer >60 >60 mL/min   GFR calc Af Amer >60 >60 mL/min   Anion gap 10 5 - 15    Comment: Performed at [facility], [address], [place], [place] [address]  Ethanol     Status: Abnormal   Collection Time: 03/26/20  7:56 PM  Result Value Ref Range   Alcohol, Ethyl (B) 298 (H) <10 mg/dL    Comment: (NOTE) Lowest detectable limit for serum alcohol is 10 mg/dL. For medical purposes only. Performed at [facility], [address] [place], [place] [address]   cbc     Status: None   Collection Time: 03/26/20  7:56 PM  Result Value Ref Range   WBC 6.2 4.0 - 10.5 K/uL   RBC 4.80 4.22 - 5.81 MIL/uL   Hemoglobin 15.8 13.0 - 17.0 g/dL   HCT 60.7 37.1 - 06.2 %   MCV 94.8 80.0 - 100.0 fL   MCH 32.9 26.0 - 34.0 pg   MCHC 34.7 30.0 - 36.0 g/dL   RDW 69.4 85.4 - 62.7 %   Platelets 201 150 - 400 K/uL   nRBC 0.0 0.0 - 0.2 %    Comment: Performed at [facility], [address], [place], [place] [address]  Acetaminophen level     Status: Abnormal   Collection Time: 03/26/20  7:56 PM  Result Value Ref Range   Acetaminophen (Tylenol), Serum <10 (L) 10 - 30 ug/mL    Comment: (NOTE) Therapeutic concentrations vary significantly. A range of 10-30 ug/mL  may be an effective concentration for many patients. However, some  are best treated at concentrations outside of this range. Acetaminophen concentrations >150 ug/mL at 4 hours after ingestion  and >50 ug/mL at 12 hours after ingestion are often associated with  toxic reactions. Performed at [facility], [address] [place], [place] [address]   Salicylate level     Status: Abnormal   Collection Time: 03/26/20  7:56  PM  Result Value Ref Range   Salicylate Lvl <7.0 (L) 7.0 - 30.0 mg/dL    Comment: Performed at [facility], [address] [place], [place] [address]  Magnesium     Status: None    Collection Time: 03/26/20  7:56 PM  Result Value Ref Range   Magnesium 2.2 1.7 - 2.4 mg/dL    Comment: Performed at [facility], [address] [place], [place] [address]  Phosphorus     Status: None   Collection Time: 03/26/20  7:56 PM  Result Value Ref Range   Phosphorus 4.5 2.5 - 4.6 mg/dL    Comment: Performed at [facility], [address] [place], [place] [address]    Current Facility-Administered Medications  Medication Dose Route Frequency Provider Last Rate Last Admin  . folic acid (FOLVITE) tablet 1 mg  1 mg Oral Daily Sharyn CreamerQuale, Mark, MD   1 mg at 03/26/20 2110  . LORazepam (ATIVAN) tablet 1-4 mg  1-4 mg Oral Q1H PRN Sharyn CreamerQuale, Mark, MD   2 mg at 03/26/20 2110   Or  . LORazepam (ATIVAN) injection 1-4 mg  1-4 mg Intravenous Q1H PRN Sharyn CreamerQuale, Mark, MD      . multivitamin with minerals tablet 1 tablet  1 tablet Oral Daily Sharyn CreamerQuale, Mark, MD   1 tablet at 03/26/20 2110  . promethazine (PHENERGAN) tablet 25 mg  25 mg Oral Q6H PRN Gillermo Murdochhompson, Rosabella Edgin, NP      . thiamine tablet 100 mg  100 mg Oral Daily Sharyn CreamerQuale, Mark, MD   100 mg at 03/26/20 2110   Or  . thiamine (B-1) injection 100 mg  100 mg Intravenous Daily Sharyn CreamerQuale, Mark, MD       Current Outpatient Medications  Medication Sig Dispense Refill  . diazepam (VALIUM) 5 MG tablet Take 2.5 mg by mouth every 12 (twelve) hours as needed for anxiety.     . Ledipasvir-Sofosbuvir (HARVONI) 90-400 MG TABS Take by mouth 1 day or 1 dose.    . metoCLOPramide (REGLAN) 10 MG tablet Take 1 tablet (10 mg total) by mouth every 6 (six) hours as needed for nausea (nausea/headache). 10 tablet 0  . QUEtiapine (SEROQUEL) 50 MG tablet Take 1 tablet (50 mg total) by mouth at bedtime. 30 tablet 1  . traZODone (DESYREL) 50 MG tablet Take 25 mg by mouth at bedtime.      Musculoskeletal: Strength & Muscle Tone: within normal limits Gait & Station: normal Patient leans: N/A  Psychiatric Specialty Exam: Physical Exam  Nursing note and vitals  reviewed. Constitutional: He is oriented to person, place, and time. He appears well-developed and well-nourished.  Cardiovascular: Normal rate.  Respiratory: Effort normal.  Musculoskeletal:        General: Normal range of motion.     Cervical back: Normal range of motion and neck supple.  Neurological: He is alert and oriented to person, place, and time.    Review of Systems  Blood pressure (!) 144/107, pulse (!) 112, temperature 98.6 F (37 C), temperature source Oral, resp. rate 17, height 5\' 9"  (1.753 m), weight 74.8 kg, SpO2 94 %.Body mass index is 24.37 kg/m.  General Appearance: Disheveled  Eye Contact:  Good  Speech:  Clear and Coherent and Pressured  Volume:  Increased  Mood:  Anxious and Irritable  Affect:  Congruent, Inappropriate and Full Range  Thought Process:  Coherent and Descriptions of Associations: Tangential  Orientation:  Full (Time, Place, and Person)  Thought Content:  Logical, Paranoid Ideation  and Rumination  Suicidal Thoughts:  No  Homicidal Thoughts:  Yes.  without intent/plan  Memory:  Immediate;   Good Recent;   Good Remote;   Good  Judgement:  Poor  Insight:  Lacking  Psychomotor Activity:  Increased  Concentration:  Concentration: Fair and Attention Span: Fair  Recall:  [employer] of Knowledge:  Fair  Language:  Fair  Akathisia:  Negative  Handed:  Right  AIMS (if indicated):     Assets:  Communication Skills Desire for Improvement Resilience Social Support  ADL's:  Intact  Cognition:  WNL  Sleep:    Insomnia     Treatment Plan Summary: Daily contact with patient to assess and evaluate symptoms and progress in treatment, Plan The patient would remain under observation overnight due to his alcohol level being 329 mg/DL and reassess in the a.m. once he is no longer under the influence. and .  Disposition: Patient does not meet criteria for psychiatric inpatient admission. The patient would remain under observation overnight and reassess  in the a.m. to determine if he meets criteria for psychiatric inpatient admission or could be discharged home.  Gillermo Murdoch, NP 03/27/2020 2:22 AM

## 2020-03-27 NOTE — BH Assessment (Signed)
Assessment Note  Nathan [name] is an 34 y.o. male presenting to [facility] ED under IVC. Per triage note Pt arrives to ED via [facility] under IVC. Law enforcement states pt reported he "has a medical condition" and "doesn't care if he died tonight". Pt admits to being "drunk as fuck"; last Heroin use was yesterday. Pt is alert, acting appropriate, in NAD; RR even, regular, and unlabored. During assessment patient appeared agitated, alert and oriented x4, speech rapid, and some flight of ideas. Patient reported why he was presenting to ED "I don't feel good, I was told I had pancreatitis and I drink to death." Patient reported "I live in a tent in the backyard." "We opened up a [employer] in [place] and I've been so busy that I forgot it was my birthday the other day, my son ran into the bathroom and told me happy birthday daddy and I forgot all about it." Patient reported "I spent $200 on a fagot ass cell phone but people can't spend $10 on a cake for my birthday." Patient denies any currently psychiatric treatment but reported that he has been diagnosed with Bipolar and Paranoid Schizophrenia. Patient reported that he has been clean from Heroin "for 2 days." Patient did appear to be scratching himself during the assessment and reported that he still currently drinks alcohol. Patient reported "I'm itching, I am sick, but I'm proud to be clean from Heroin." Patient reported today with his interaction with police that he was having SI "I told the police to shoot me because I didn't want to hurt myself, I just wanted to die because I get so little back, I just get sick sometimes." Patient denies current HI/AH/VH and does not appear to be responding to any internal or external stimuli. Patient BAL 298.  Per Psyc NP patient will be observed overnight and reassessed in the morning.   Diagnosis: Bipolar Disorder, Alcohol Use Disorder, Severe  Past Medical History:  Past Medical History:  Diagnosis Date  . Bipolar 1  disorder (HCC)   . Chronic headache   . 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 Horseshoe kidney     Past Surgical History:  Procedure Laterality Date  . TONSILLECTOMY      Family History:  Family History  Problem Relation Age of Onset  . Hypertension Mother   . Diabetes Mother   . Hypertension Father   . Heart failure Father   . Diabetes Father     Social History:  reports that he has been smoking cigarettes. He has been smoking about 1.50 packs per day. His smokeless tobacco use includes chew. He reports current alcohol use of about 6.0 standard drinks of alcohol per week. He reports current drug use. Drug: IV.  Additional Social History:  Alcohol / Drug Use Pain Medications: See MAR Prescriptions: See MAR Over the Counter: See MAR History of alcohol / drug use?: Yes Substance #1 Name of Substance 1: Alcohol  CIWA: CIWA-Ar BP: (!) 144/107 Pulse Rate: (!) 112 COWS:    Allergies:  Allergies  Allergen Reactions  . Geodon [Ziprasidone Hcl] Other (See Comments)    Seizures    Home Medications: (Not in a hospital admission)   OB/GYN Status:  No LMP for male patient.  General Assessment Data Location of Assessment: [facility] ED TTS Assessment: In system Is this a Tele or Face-to-Face Assessment?: Face-to-Face Is this an Initial Assessment or a Re-assessment for this encounter?: Initial Assessment Patient Accompanied by:: N/A Language Other than  English: No Living Arrangements: Homeless/Shelter What gender do you identify as?: Male Marital status: Single Living Arrangements: Other (Comment)(Homeless in a tent behind his home) Can pt return to current living arrangement?: Yes Admission Status: Involuntary Petitioner: Police Is patient capable of signing voluntary admission?: No Referral Source: Other Insurance type: None  Medical Screening Exam [facility] Walk-in ONLY) Medical Exam completed: Yes  Crisis Care Plan Living Arrangements: Other (Comment)(Homeless in a tent  behind his home) Legal Guardian: Other:(Self) Name of Psychiatrist: None Name of Therapist: None  Education Status Is patient currently in school?: No Is the patient employed, unemployed or receiving disability?: Unemployed  Risk to self with the past 6 months Suicidal Ideation: Yes-Currently Present Has patient been a risk to self within the past 6 months prior to admission? : Yes Suicidal Intent: Yes-Currently Present Has patient had any suicidal intent within the past 6 months prior to admission? : Yes Is patient at risk for suicide?: No Suicidal Plan?: No Has patient had any suicidal plan within the past 6 months prior to admission? : No Access to Means: No What has been your use of drugs/alcohol within the last 12 months?: Alcohol, Heroin Previous Attempts/Gestures: No How many times?: 0 Triggers for Past Attempts: None known Intentional Self Injurious Behavior: None Family Suicide History: Unknown Recent stressful life event(s): Other (Comment)(Physical Health issues) Persecutory voices/beliefs?: No Depression: Yes Depression Symptoms: Insomnia, Tearfulness, Feeling worthless/self pity, Feeling angry/irritable Substance abuse history and/or treatment for substance abuse?: Yes Suicide prevention information given to non-admitted patients: Not applicable  Risk to Others within the past 6 months Homicidal Ideation: No Does patient have any lifetime risk of violence toward others beyond the six months prior to admission? : No Thoughts of Harm to Others: No Current Homicidal Intent: No Current Homicidal Plan: No Access to Homicidal Means: No History of harm to others?: No Assessment of Violence: None Noted Does patient have access to weapons?: No Criminal Charges Pending?: No Does patient have a court date: No Is patient on probation?: No  Psychosis Hallucinations: None noted Delusions: None noted  Mental Status Report Appearance/Hygiene: In scrubs Eye Contact:  Good Motor Activity: Freedom of movement Speech: Logical/coherent Level of Consciousness: Alert Mood: Anxious, Irritable Affect: Anxious, Irritable Anxiety Level: Moderate Thought Processes: Flight of Ideas Judgement: Partial Orientation: Person, Place, Time, Situation, Appropriate for developmental age Obsessive Compulsive Thoughts/Behaviors: None  Cognitive Functioning Concentration: Good Memory: Recent Intact, Remote Intact Is patient IDD: No Insight: Fair Impulse Control: Fair Appetite: Good Have you had any weight changes? : No Change Sleep: No Change Total Hours of Sleep: 8 Vegetative Symptoms: None  ADLScreening [facility] Assessment Services) Patient's cognitive ability adequate to safely complete daily activities?: Yes Patient able to express need for assistance with ADLs?: Yes Independently performs ADLs?: Yes (appropriate for developmental age)  Prior Inpatient Therapy Prior Inpatient Therapy: No  Prior Outpatient Therapy Prior Outpatient Therapy: No Does patient have an ACCT team?: No Does patient have Intensive In-House Services?  : No Does patient have [facility] services? : No Does patient have P4CC services?: No  ADL Screening (condition at time of admission) Patient's cognitive ability adequate to safely complete daily activities?: Yes Is the patient deaf or have difficulty hearing?: No Does the patient have difficulty seeing, even when wearing glasses/contacts?: No Does the patient have difficulty concentrating, remembering, or making decisions?: No Patient able to express need for assistance with ADLs?: Yes Does the patient have difficulty dressing or bathing?: No Independently performs ADLs?: Yes (appropriate for developmental  age) Does the patient have difficulty walking or climbing stairs?: No Weakness of Legs: None Weakness of Arms/Hands: None  Home Assistive Devices/Equipment Home Assistive Devices/Equipment: None  Therapy Consults (therapy  consults require a physician order) PT Evaluation Needed: No OT Evalulation Needed: No SLP Evaluation Needed: No Abuse/Neglect Assessment (Assessment to be complete while patient is alone) Abuse/Neglect Assessment Can Be Completed: Yes Physical Abuse: Denies Verbal Abuse: Denies Sexual Abuse: Denies Exploitation of patient/patient's resources: Denies Self-Neglect: Denies Values / Beliefs Cultural Requests During Hospitalization: None Spiritual Requests During Hospitalization: None Consults Spiritual Care Consult Needed: No Transition of Care Team Consult Needed: No Advance Directives (For Healthcare) Does Patient Have a Medical Advance Directive?: No          Disposition: Per Psyc NP patient will be observed overnight and reassessed in the morning.  Disposition Initial Assessment Completed for this Encounter: Yes  On Site Evaluation by:   Reviewed with Physician:    Benay Pike MS LCASA 03/27/2020 1:06 AM

## 2020-03-27 NOTE — ED Notes (Signed)
IVC Rescinded/Re-Evaluated/D/C'd 

## 2020-03-27 NOTE — Consult Note (Signed)
  Patient was re-evaluated this morning.  He endorses the information in the NP note as accurate.  Patient continues to deny suicidal ideation.  He is able to reasonably explain his presentation as due to intoxication.  Patient was encouraged to restrict his drinking as it has been known to get him into similar situations like this in the past.  Patient in agreement, and endorses that he will follow up with outpatient psychiatric care.  No acute concerns at this time.  IVC will be rescinded. Patient does not meet inpatient psychiatric criteria.

## 2020-03-27 NOTE — ED Notes (Signed)
Pt discharged home with bus pass. VS stable. Pt denies SI/HI. Discharge instructions reviewed with patient. Patient signed for discharge. All belongings returned to patient.

## 2020-03-27 NOTE — ED Provider Notes (Signed)
Procedures  Clinical Course as of Mar 27 1025  Wed Mar 26, 2020  2131 Patient becoming agitated, elevating mood, began to exhibit some violent-like behavior but not actually hurting anyone.  I have ordered medication for calming and sedation.   [MQ]    Clinical Course User Index [MQ] Sharyn Creamer, MD    ----------------------------------------- 10:26 AM on 03/27/2020 -----------------------------------------  Discussed with psychiatry Dr. Cindi Carbon after his reassessment this morning.  Finds the patient to currently be lucid, sober, denying SI HI or hallucinations.  Consistent with multiple previous presentations of alcohol-induced mood disorder, stable for discharge home.  IVC rescinded by psychiatry.   Sharman Cheek, MD 03/27/20 1027

## 2020-03-30 ENCOUNTER — Emergency Department
Admission: EM | Admit: 2020-03-30 | Discharge: 2020-03-31 | Disposition: A | Discharge status: Home / Self Care | Payer: Self-pay | Attending: Emergency Medicine | Admitting: Emergency Medicine

## 2020-03-30 ENCOUNTER — Other Ambulatory Visit: Payer: Self-pay

## 2020-03-30 DIAGNOSIS — F1010 Alcohol abuse, uncomplicated: Secondary | ICD-10-CM

## 2020-03-30 DIAGNOSIS — F1290 Cannabis use, unspecified, uncomplicated: Secondary | ICD-10-CM

## 2020-03-30 DIAGNOSIS — F419 Anxiety disorder, unspecified: Secondary | ICD-10-CM | POA: Insufficient documentation

## 2020-03-30 DIAGNOSIS — R110 Nausea: Secondary | ICD-10-CM | POA: Insufficient documentation

## 2020-03-30 DIAGNOSIS — F10129 Alcohol abuse with intoxication, unspecified: Secondary | ICD-10-CM | POA: Insufficient documentation

## 2020-03-30 DIAGNOSIS — F1123 Opioid dependence with withdrawal: Secondary | ICD-10-CM | POA: Insufficient documentation

## 2020-03-30 DIAGNOSIS — F32A Depression, unspecified: Secondary | ICD-10-CM

## 2020-03-30 DIAGNOSIS — F17210 Nicotine dependence, cigarettes, uncomplicated: Secondary | ICD-10-CM | POA: Insufficient documentation

## 2020-03-30 DIAGNOSIS — F329 Major depressive disorder, single episode, unspecified: Secondary | ICD-10-CM | POA: Insufficient documentation

## 2020-03-30 DIAGNOSIS — F1193 Opioid use, unspecified with withdrawal: Secondary | ICD-10-CM

## 2020-03-30 DIAGNOSIS — F319 Bipolar disorder, unspecified: Secondary | ICD-10-CM | POA: Insufficient documentation

## 2020-03-30 DIAGNOSIS — Y908 Blood alcohol level of 240 mg/100 ml or more: Secondary | ICD-10-CM | POA: Insufficient documentation

## 2020-03-30 HISTORY — DX: Alcohol abuse, uncomplicated: F10.10

## 2020-03-30 HISTORY — DX: Major depressive disorder, single episode, unspecified: F32.9

## 2020-03-30 HISTORY — DX: Depression, unspecified: F32.A

## 2020-03-30 HISTORY — DX: Cannabis use, unspecified, uncomplicated: F12.90

## 2020-03-30 LAB — COMPREHENSIVE METABOLIC PANEL
ALT: 97 U/L — ABNORMAL HIGH (ref 0–44)
AST: 128 U/L — ABNORMAL HIGH (ref 15–41)
Albumin: 5 g/dL (ref 3.5–5.0)
Alkaline Phosphatase: 81 U/L (ref 38–126)
Anion gap: 12 (ref 5–15)
BUN: 6 mg/dL (ref 6–20)
CO2: 25 mmol/L (ref 22–32)
Calcium: 9.1 mg/dL (ref 8.9–10.3)
Chloride: 102 mmol/L (ref 98–111)
Creatinine, Ser: 0.61 mg/dL (ref 0.61–1.24)
GFR calc Af Amer: >60 mL/min (ref >60)
GFR calc non Af Amer: >60 mL/min (ref >60)
Glucose, Bld: 99 mg/dL (ref 70–99)
Potassium: 4.4 mmol/L (ref 3.5–5.1)
Sodium: 139 mmol/L (ref 135–145)
Total Bilirubin: 0.3 mg/dL (ref 0.3–1.2)
Total Protein: 9.6 g/dL — ABNORMAL HIGH (ref 6.5–8.1)

## 2020-03-30 LAB — CBC
HCT: 49.9 % (ref 39.0–52.0)
Hemoglobin: 17.2 g/dL — ABNORMAL HIGH (ref 13.0–17.0)
MCH: 33.1 pg (ref 26.0–34.0)
MCHC: 34.5 g/dL (ref 30.0–36.0)
MCV: 96 fL (ref 80.0–100.0)
Platelets: 239 10*3/uL (ref 150–400)
RBC: 5.2 MIL/uL (ref 4.22–5.81)
RDW: 14.1 % (ref 11.5–15.5)
WBC: 7.4 10*3/uL (ref 4.0–10.5)
nRBC: 0 % (ref 0.0–0.2)

## 2020-03-30 LAB — ETHANOL: Alcohol, Ethyl (B): 377 mg/dL (ref <10)

## 2020-03-30 LAB — SALICYLATE LEVEL: Salicylate Lvl: <7 mg/dL — ABNORMAL LOW (ref 7.0–30.0)

## 2020-03-30 LAB — ACETAMINOPHEN LEVEL: Acetaminophen (Tylenol), Serum: <10 ug/mL — ABNORMAL LOW (ref 10–30)

## 2020-03-30 MED ORDER — DIAZEPAM 2 MG PO TABS
2.0000 mg | ORAL_TABLET | Freq: Once | ORAL | Status: AC
  Administered 2020-03-30: 2 mg via ORAL
  Filled 2020-03-30: qty 1

## 2020-03-30 MED ORDER — ONDANSETRON 4 MG PO TBDP
4.0000 mg | ORAL_TABLET | Freq: Once | ORAL | Status: AC
  Administered 2020-03-30: 4 mg via ORAL
  Filled 2020-03-30: qty 1

## 2020-03-30 NOTE — ED Notes (Signed)
Pt. Transferred from Triage to room 20 after dressing out and screening for contraband. Pt. Oriented to Quad including Q15 minute rounds as well as Rover and Officer for their protection. Patient is alert and oriented, warm and dry in no acute distress. Patient denies SI, HI, and AVH. Pt. Encouraged to let me know if needs arise.     

## 2020-03-30 NOTE — ED Notes (Signed)
Hourly rounding reveals patient awake in room. No complaints, stable, in no acute distress. Q15 minute rounds and monitoring via Rover and Officer to continue.  

## 2020-03-30 NOTE — ED Triage Notes (Signed)
Patient was dropped off by police. Patient is AOx4 and ambulatory. Patient chief complaint is Depression and did state he has been consuming alcoholic drinks with the last drink taken around 1700. Patient state he also admits to smoking Marijuana. Patient states he is just very sad, disappointed, and feels as if "the world has spit on me". Patient is requesting help with mental health. Patient currently denies SI and HI.

## 2020-03-30 NOTE — ED Notes (Signed)
Notified Dr. Don Perking of 377 ETOH result.

## 2020-03-31 DIAGNOSIS — F1010 Alcohol abuse, uncomplicated: Secondary | ICD-10-CM

## 2020-03-31 MED ORDER — LORAZEPAM 1 MG PO TABS
1.0000 mg | ORAL_TABLET | Freq: Once | ORAL | Status: AC
  Administered 2020-03-31: 1 mg via ORAL
  Filled 2020-03-31: qty 1

## 2020-03-31 MED ORDER — ACETAMINOPHEN 500 MG PO TABS
1000.0000 mg | ORAL_TABLET | Freq: Once | ORAL | Status: AC
  Administered 2020-03-31: 1000 mg via ORAL
  Filled 2020-03-31: qty 2

## 2020-03-31 NOTE — ED Notes (Signed)
Hourly rounding reveals patient sleeping in room. No complaints, stable, in no acute distress. Q15 minute rounds and monitoring via Rover and Officer to continue.  

## 2020-03-31 NOTE — ED Notes (Signed)
Patient came out of room and told the nurse, I am having trouble breathing. Nurse took O2 monitor in his room and monitored his oxygen and patient was at 97%-98% for a minute. MD notified

## 2020-03-31 NOTE — Consult Note (Signed)
[facility] Face-to-Face Psychiatry Consult   Reason for Consult:  Psych evaluation  Referring Physician:  Dr. Don Perking Patient Identification: Nathan [name] MRN:  [id number] Principal Diagnosis: <principal problem not specified> Diagnosis:  Active Problems:   * No active hospital problems. *   Total Time spent with patient: 30 minutes  Subjective:   Nathan [name] is a 34 y.o. male patient admitted with alcohol intoxication. He says that his family is weaponizing his children.  HPI:  Per EDP: Nathan [name] is a 34 y.o. male with a history of alcohol abuse, heroin abuse, hepatitis C, bipolar who was brought in by police for psychiatric evaluation.  Patient reports that he has been clean from heroin for the last 4 days.  He feels that he is actively withdrawing.  He feels itchy, nauseous, anxious, and having diarrhea.  Has been drinking alcohol with the last drink at 5 PM.  Also smokes marijuana.  He asked a friend to call 911 so he could come to the hospital to be helped with symptoms of withdrawal.  Patient reports that "I am here to see Dr. Manson Passey because last time he gave me something to make me feel really good."  He denies suicidal homicidal thoughts.  On assessment, patient states that he is here because his family keeps "messing with him."He also say that he caught a charge tonight because he is accused of abusing 911.  At this time patients BAL is 377.  He is psych cleared from a psychiatric stand point. He is denying SI, HI, and AVH. He is not experiencing delusional thought content, nor is he paranoid.    Past Psychiatric History:   Risk to Self:   Risk to Others:   Prior Inpatient Therapy:   Prior Outpatient Therapy:    Past Medical History:  Past Medical History:  Diagnosis Date  . Bipolar 1 disorder (HCC)   . Chronic headache   . Depression 03/30/2020  . ETOH abuse 03/30/2020  . 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 Horseshoe kidney   . Marijuana smoker 03/30/2020     Past Surgical History:  Procedure Laterality Date  . TONSILLECTOMY     Family History:  Family History  Problem Relation Age of Onset  . Hypertension Mother   . Diabetes Mother   . Hypertension Father   . Heart failure Father   . Diabetes Father    Family Psychiatric  History: unknown Social History:  Social History   Substance and Sexual Activity  Alcohol Use Yes  . Alcohol/week: 6.0 standard drinks  . Types: 6 Cans of beer per week     Social History   Substance and Sexual Activity  Drug Use Yes  . Types: IV   Comment: heroine    Social History   Socioeconomic History  . Marital status: Single    Spouse name: Not on file  . Number of children: Not on file  . Years of education: Not on file  . Highest education level: Not on file  Occupational History  . Not on file  Tobacco Use  . Smoking status: Current Every Day Smoker    Packs/day: 1.50    Types: Cigarettes  . Smokeless tobacco: Current User    Types: Chew  Substance and Sexual Activity  . Alcohol use: Yes    Alcohol/week: 6.0 standard drinks    Types: 6 Cans of beer per week  . Drug use: Yes    Types: IV  Comment: heroine  . Sexual activity: Yes  Other Topics Concern  . Not on file  Social History Narrative  . Not on file   Social Determinants of Health   Financial Resource Strain:   . Difficulty of Paying Living Expenses:   Food Insecurity:   . Worried About Programme researcher, broadcasting/film/video in the Last Year:   . Barista in the Last Year:   Transportation Needs:   . Freight forwarder (Medical):   Marland Kitchen Lack of Transportation (Non-Medical):   Physical Activity:   . Days of Exercise per Week:   . Minutes of Exercise per Session:   Stress:   . Feeling of Stress :   Social Connections:   . Frequency of Communication with Friends and Family:   . Frequency of Social Gatherings with Friends and Family:   . Attends Religious Services:   . Active Member of Clubs or Organizations:   .  Attends Banker Meetings:   Marland Kitchen Marital Status:    Additional Social History:    Allergies:   Allergies  Allergen Reactions  . Geodon [Ziprasidone Hcl] Other (See Comments)    Seizures  . Valproic Acid   . Levofloxacin Itching    Labs:  Results for orders placed or performed during the hospital encounter of 03/30/20 (from the past 48 hour(s))  Comprehensive metabolic panel     Status: Abnormal   Collection Time: 03/30/20 10:07 PM  Result Value Ref Range   Sodium 139 135 - 145 mmol/L   Potassium 4.4 3.5 - 5.1 mmol/L   Chloride 102 98 - 111 mmol/L   CO2 25 22 - 32 mmol/L   Glucose, Bld 99 70 - 99 mg/dL    Comment: Glucose reference range applies only to samples taken after fasting for at least 8 hours.   BUN 6 6 - 20 mg/dL   Creatinine, Ser 9.44 0.61 - 1.24 mg/dL   Calcium 9.1 8.9 - 96.7 mg/dL   Total Protein 9.6 (H) 6.5 - 8.1 g/dL   Albumin 5.0 3.5 - 5.0 g/dL   AST 591 (H) 15 - 41 U/L   ALT 97 (H) 0 - 44 U/L   Alkaline Phosphatase 81 38 - 126 U/L   Total Bilirubin 0.3 0.3 - 1.2 mg/dL   GFR calc non Af Amer >60 >60 mL/min   GFR calc Af Amer >60 >60 mL/min   Anion gap 12 5 - 15    Comment: Performed at [facility], [address] [place], [place] [address]  Ethanol     Status: Abnormal   Collection Time: 03/30/20 10:07 PM  Result Value Ref Range   Alcohol, Ethyl (B) 377 (HH) <10 mg/dL    Comment: CRITICAL RESULT CALLED TO, READ BACK BY AND VERIFIED WITH GARY LEDUC ON 03/30/20 AT 2308 SRC (NOTE) Lowest detectable limit for serum alcohol is 10 mg/dL. For medical purposes only. Performed at [facility], [address] [place], [place] [address]   cbc     Status: Abnormal   Collection Time: 03/30/20 10:07 PM  Result Value Ref Range   WBC 7.4 4.0 - 10.5 K/uL   RBC 5.20 4.22 - 5.81 MIL/uL   Hemoglobin 17.2 (H) 13.0 - 17.0 g/dL   HCT 57.0 17.7 - 93.9 %   MCV 96.0 80.0 - 100.0 fL   MCH 33.1 26.0 - 34.0 pg   MCHC 34.5 30.0 - 36.0 g/dL    RDW 03.0 09.2 - 33.0 %  Platelets 239 150 - 400 K/uL   nRBC 0.0 0.0 - 0.2 %    Comment: Performed at [facility], [address] [place], [place] [address]  Salicylate level     Status: Abnormal   Collection Time: 03/30/20 10:07 PM  Result Value Ref Range   Salicylate Lvl <7.0 (L) 7.0 - 30.0 mg/dL    Comment: Performed at [facility], [address] [place], [place] [address]  Acetaminophen level     Status: Abnormal   Collection Time: 03/30/20 10:07 PM  Result Value Ref Range   Acetaminophen (Tylenol), Serum <10 (L) 10 - 30 ug/mL    Comment: (NOTE) Therapeutic concentrations vary significantly. A range of 10-30 ug/mL  may be an effective concentration for many patients. However, some  are best treated at concentrations outside of this range. Acetaminophen concentrations >150 ug/mL at 4 hours after ingestion  and >50 ug/mL at 12 hours after ingestion are often associated with  toxic reactions. Performed at [facility], [address] [place], [place] [address]     No current facility-administered medications for this encounter.   Current Outpatient Medications  Medication Sig Dispense Refill  . diazepam (VALIUM) 5 MG tablet Take 2.5 mg by mouth every 12 (twelve) hours as needed for anxiety.     . Ledipasvir-Sofosbuvir (HARVONI) 90-400 MG TABS Take by mouth 1 day or 1 dose.    . metoCLOPramide (REGLAN) 10 MG tablet Take 1 tablet (10 mg total) by mouth every 6 (six) hours as needed for nausea (nausea/headache). 10 tablet 0  . QUEtiapine (SEROQUEL) 50 MG tablet Take 1 tablet (50 mg total) by mouth at bedtime. 30 tablet 1  . traZODone (DESYREL) 50 MG tablet Take 25 mg by mouth at bedtime.      Musculoskeletal: Strength & Muscle Tone: within normal limits Gait & Station: normal Patient leans: N/A  Psychiatric Specialty Exam: Physical Exam  Nursing note and vitals reviewed. Constitutional: He is oriented to person, place, and time. He  appears well-developed.  HENT:  Head: Normocephalic.  Eyes: Pupils are equal, round, and reactive to light.  Cardiovascular: Normal rate.  Respiratory: Effort normal.  Musculoskeletal:        General: Normal range of motion.     Cervical back: Normal range of motion.  Neurological: He is alert and oriented to person, place, and time.  Skin: Skin is warm and dry.  Psychiatric: He has a normal mood and affect. His speech is normal and behavior is normal. Thought content normal. Cognition and memory are normal. He expresses impulsivity.    Review of Systems  Psychiatric/Behavioral: Negative for confusion, hallucinations and suicidal ideas.  All other systems reviewed and are negative.   Blood pressure 136/78, pulse (!) 120, temperature 97.6 F (36.4 C), temperature source Oral, resp. rate 19, height 5\' 9"  (1.753 m), weight 74.8 kg, SpO2 100 %.Body mass index is 24.35 kg/m.  General Appearance: Casual  Eye Contact:  Good  Speech:  Normal Rate  Volume:  Increased  Mood:  Euthymic  Affect:  Congruent  Thought Process:  Coherent and Descriptions of Associations: Intact  Orientation:  Full (Time, Place, and Person)  Thought Content:  WDL  Suicidal Thoughts:  No  Homicidal Thoughts:  No  Memory:  Immediate;   Fair  Judgement:  Fair  Insight:  Lacking  Psychomotor Activity:  Normal  Concentration:  Attention Span: Fair  Recall:  of Knowledge:  Fair  Language:  Fair  Akathisia:  NA  Handed:  Right  AIMS (if indicated):     Assets:  Communication Skills Resilience  ADL's:  Intact  Cognition:  WNL  Sleep:        Treatment Plan Summary: DC in the am when ETOH metabalizes   Disposition: No evidence of imminent risk to self or others at present.   Supportive therapy provided about ongoing stressors. Discussed crisis plan, support from social network, calling 911, coming to the Emergency Department, and calling Suicide Hotline.  Deloria Lair, NP 03/31/2020 1:25  AM

## 2020-03-31 NOTE — ED Notes (Signed)
Hourly rounding reveals patient sleeping in room. No complaints, stable, in no acute distress. Q15 minute rounds and monitoring via Security to continue. 

## 2020-03-31 NOTE — ED Provider Notes (Signed)
-----------------------------------------   9:42 AM on 03/31/2020 -----------------------------------------  Patient appears clinically sober.  States he is having some mild shortness of breath which he relates to anxiety/panic, states he normally takes Valium 2.5 mg twice daily.  I did order a one-time dose of Ativan 1 mg orally for the patient.  He now states he is ready to leave.  We will discharge the patient at this time.   Minna Antis, MD 03/31/20 [phone]

## 2020-03-31 NOTE — ED Notes (Signed)
Hourly rounding reveals patient awake in room. No complaints, stable, in no acute distress. Q15 minute rounds and monitoring via Rover and Officer to continue.  

## 2020-03-31 NOTE — ED Provider Notes (Signed)
[facility] Emergency Department Provider Note  ____________________________________________  Time seen: Approximately 12:40 AM  I have reviewed the triage vital signs and the nursing notes.   HISTORY  Chief Complaint Depression and Alcohol Intoxication (Last drink was round 1700 today)   HPI Nathan [name] is a 34 y.o. male with a history of alcohol abuse, heroin abuse, hepatitis C, bipolar who was brought in by police for psychiatric evaluation.  Patient reports that he has been clean from heroin for the last 4 days.  He feels that he is actively withdrawing.  He feels itchy, nauseous, anxious, and having diarrhea.  Has been drinking alcohol with the last drink at 5 PM.  Also smokes marijuana.  He asked a friend to call 911 so he could come to the hospital to be helped with symptoms of withdrawal.  Patient reports that "I am here to see Dr. Owens Shark because last time he gave me something to make me feel really good."  He denies suicidal homicidal thoughts.  Past Medical History:  Diagnosis Date  . Bipolar 1 disorder ([place])   . Chronic headache   . Depression 03/30/2020  . ETOH abuse 03/30/2020  . Hep C w/o coma, chronic ([place])   . Horseshoe kidney   . Marijuana smoker 03/30/2020    Patient Active Problem List   Diagnosis Date Noted  . Bipolar 1 disorder, depressed ([place]) 01/31/2018  . Bipolar 1 disorder, mixed, severe ([place]) 04/22/2016  . Alcohol use disorder, mild, abuse 04/22/2016  . Suicidal ideation 04/22/2016  . Tobacco use disorder 04/22/2016  . 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 TONSILLECTOMY      Prior to Admission medications   Medication Sig Start Date End Date Taking? Authorizing Provider  diazepam (VALIUM) 5 MG tablet Take 2.5 mg by mouth every 12 (twelve) hours as needed for anxiety.     [provider]  Ledipasvir-Sofosbuvir (HARVONI)  90-400 MG TABS Take by mouth 1 day or 1 dose.    [provider]  metoCLOPramide (REGLAN) 10 MG tablet Take 1 tablet (10 mg total) by mouth every 6 (six) hours as needed for nausea (nausea/headache). 02/17/20   Drenda Freeze, MD  QUEtiapine (SEROQUEL) 50 MG tablet Take 1 tablet (50 mg total) by mouth at bedtime. 01/31/18   Clapacs, Madie Reno, MD  traZODone (DESYREL) 50 MG tablet Take 25 mg by mouth at bedtime.    [provider]    Allergies Geodon [ziprasidone hcl]  Family History  Problem Relation Age of Onset  . Hypertension Mother   . Diabetes Mother   . Hypertension Father   . Heart failure Father   . Diabetes Father     Social History Social History   Tobacco Use  . Smoking status: Current Every Day Smoker    Packs/day: 1.50    Types: Cigarettes  . Smokeless tobacco: Current User    Types: Chew  Substance Use Topics  . Alcohol use: Yes    Alcohol/week: 6.0 standard drinks    Types: 6 Cans of beer per week  . Drug use: Yes    Types: IV    Comment: heroine    Review of Systems  Constitutional: Negative for fever. Eyes: Negative for visual changes. ENT: Negative for sore throat. Neck: No neck pain  Cardiovascular: Negative for chest pain. Respiratory: Negative for shortness of breath. Gastrointestinal: Negative for abdominal pain, vomiting or diarrhea. +  nausea Genitourinary: Negative for dysuria. Musculoskeletal: Negative for back pain. Skin: Negative for rash. Neurological: Negative for headaches, weakness or numbness. Psych: No SI or HI  ____________________________________________   PHYSICAL EXAM:  VITAL SIGNS: ED Triage Vitals  Enc Vitals Group     BP 03/30/20 2214 136/78     Pulse Rate 03/30/20 2214 (!) 120     Resp 03/30/20 2214 19     Temp 03/30/20 2214 97.6 F (36.4 C)     Temp Source 03/30/20 2214 Oral     SpO2 03/30/20 2214 100 %     Weight 03/30/20 2215 164 lb 14.5 oz (74.8 kg)     Height 03/30/20 2215 5\' 9"  (1.753 m)      Head Circumference --      Peak Flow --      Pain Score 03/30/20 2215 0     Pain Loc --      Pain Edu? --      Excl. in GC? --     Constitutional: Alert and oriented. Well appearing and in no apparent distress. HEENT:      Head: Normocephalic and atraumatic.         Eyes: Conjunctivae are normal. Sclera is non-icteric.       Mouth/Throat: Mucous membranes are moist.       Neck: Supple with no signs of meningismus. Cardiovascular: Regular rate and rhythm.  Respiratory: Normal respiratory effort.  Gastrointestinal: Soft, non tender, and non distended. Musculoskeletal: No edema, cyanosis, or erythema of extremities. Neurologic: Normal speech and language. Face is symmetric. Moving all extremities. No gross focal neurologic deficits are appreciated. Skin: Skin is warm, dry and intact. No rash noted. Psychiatric: Mood and affect are normal. Speech and behavior are normal.  ____________________________________________   LABS (all labs ordered are listed, but only abnormal results are displayed)  Labs Reviewed  COMPREHENSIVE METABOLIC PANEL - Abnormal; Notable for the following components:      Result Value   Total Protein 9.6 (*)    AST 128 (*)    ALT 97 (*)    All other components within normal limits  ETHANOL - Abnormal; Notable for the following components:   Alcohol, Ethyl (B) 377 (*)    All other components within normal limits  CBC - Abnormal; Notable for the following components:   Hemoglobin 17.2 (*)    All other components within normal limits  SALICYLATE LEVEL - Abnormal; Notable for the following components:   Salicylate Lvl <7.0 (*)    All other components within normal limits  ACETAMINOPHEN LEVEL - Abnormal; Notable for the following components:   Acetaminophen (Tylenol), Serum <10 (*)    All other components within normal limits  URINE DRUG SCREEN, QUALITATIVE ([facility] ONLY)   ____________________________________________  EKG  none    ____________________________________________  RADIOLOGY  none  ____________________________________________   PROCEDURES  Procedure(s) performed: None Procedures Critical Care performed:  None ____________________________________________   INITIAL IMPRESSION / ASSESSMENT AND PLAN / ED COURSE  34 y.o. male with a history of alcohol abuse, heroin abuse, hepatitis C, bipolar who was brought in by police for psychiatric evaluation after calling 911 for withdrawals from heroin. + ETOH. Patient stable. COWS score of 5 for mild withdrawal symptoms. Will give his prescribed valium and zofran.  Labs show no significant electrolyte derangements or AKI.  Patient with stable mild transaminitis.  Alcohol level of 377.  Patient does not meet criteria for IVC with no suicidal or homicidal thoughts.  Will monitor for  any worsening withdrawal symptoms.  Will consult TTS and psychiatry.  Old medical records have been reviewed.    _________________________ 12:46 AM on 03/31/2020 -----------------------------------------  The patient has been placed in psychiatric observation due to the need to provide a safe environment for the patient while obtaining psychiatric consultation and evaluation, as well as ongoing medical and medication management to treat the patient's condition.  The patient has not been placed under full IVC at this time.    Please note:  Patient was evaluated in Emergency Department today for the symptoms described in the history of present illness. Patient was evaluated in the context of the global COVID-19 pandemic, which necessitated consideration that the patient might be at risk for infection with the SARS-CoV-2 virus that causes COVID-19. Institutional protocols and algorithms that pertain to the evaluation of patients at risk for COVID-19 are in a state of rapid change based on information released by regulatory bodies including the CDC and federal and state organizations. These  policies and algorithms were followed during the patient's care in the ED.  Some ED evaluations and interventions may be delayed as a result of limited staffing during the pandemic.  ____________________________________________   FINAL CLINICAL IMPRESSION(S) / ED DIAGNOSES   Final diagnoses:  Alcohol abuse  Heroin withdrawal (HCC)      NEW MEDICATIONS STARTED DURING THIS VISIT:  ED Discharge Orders    None       Note:  This document was prepared using Dragon voice recognition software and may include unintentional dictation errors.    Don Perking, [place], MD 03/31/20 [phone]

## 2020-03-31 NOTE — ED Notes (Signed)
Patient received a bus pass also

## 2020-03-31 NOTE — ED Notes (Signed)
Patient discharged home, patient received discharge papers. Patient received belongings and verbalized he has received all of his belongings. Patient appropriate and cooperative, Denies SI/HI AVH. Vital signs taken. NAD noted. 

## 2020-03-31 NOTE — ED Notes (Signed)
Patient talking with MD

## 2020-03-31 NOTE — ED Notes (Signed)
Pt asleep, breakfast tray placed on sink in rm.  

## 2020-03-31 NOTE — Discharge Instructions (Signed)
You have been seen in the Emergency Department (ED)  today for a psychiatric complaint.  You have been evaluated by psychiatry and we believe you are safe to be discharged from the hospital.   ° °Please return to the Emergency Department (ED)  immediately if you have ANY thoughts of hurting yourself or anyone else, so that we may help you. ° °Please avoid alcohol and drug use. ° °Follow up with your doctor and/or therapist as soon as possible regarding today's ED  visit.  ° °You may call crisis hotline for [place] at [phone]. ° °

## 2020-04-04 ENCOUNTER — Emergency Department
Admission: EM | Admit: 2020-04-04 | Discharge: 2020-04-05 | Disposition: A | Discharge status: Home / Self Care | Payer: Self-pay | Attending: Emergency Medicine | Admitting: Emergency Medicine

## 2020-04-04 ENCOUNTER — Emergency Department: Payer: Self-pay

## 2020-04-04 ENCOUNTER — Other Ambulatory Visit: Payer: Self-pay

## 2020-04-04 DIAGNOSIS — F17210 Nicotine dependence, cigarettes, uncomplicated: Secondary | ICD-10-CM | POA: Insufficient documentation

## 2020-04-04 DIAGNOSIS — Y929 Unspecified place or not applicable: Secondary | ICD-10-CM | POA: Insufficient documentation

## 2020-04-04 DIAGNOSIS — F319 Bipolar disorder, unspecified: Secondary | ICD-10-CM | POA: Insufficient documentation

## 2020-04-04 DIAGNOSIS — Z046 Encounter for general psychiatric examination, requested by authority: Secondary | ICD-10-CM | POA: Insufficient documentation

## 2020-04-04 DIAGNOSIS — R45850 Homicidal ideations: Secondary | ICD-10-CM | POA: Insufficient documentation

## 2020-04-04 DIAGNOSIS — R4589 Other symptoms and signs involving emotional state: Secondary | ICD-10-CM

## 2020-04-04 DIAGNOSIS — S0181XA Laceration without foreign body of other part of head, initial encounter: Secondary | ICD-10-CM | POA: Insufficient documentation

## 2020-04-04 DIAGNOSIS — R4588 Nonsuicidal self-harm: Secondary | ICD-10-CM

## 2020-04-04 DIAGNOSIS — Z7289 Other problems related to lifestyle: Secondary | ICD-10-CM | POA: Insufficient documentation

## 2020-04-04 DIAGNOSIS — Y999 Unspecified external cause status: Secondary | ICD-10-CM | POA: Insufficient documentation

## 2020-04-04 DIAGNOSIS — F10920 Alcohol use, unspecified with intoxication, uncomplicated: Secondary | ICD-10-CM | POA: Insufficient documentation

## 2020-04-04 DIAGNOSIS — Y908 Blood alcohol level of 240 mg/100 ml or more: Secondary | ICD-10-CM | POA: Insufficient documentation

## 2020-04-04 DIAGNOSIS — R45851 Suicidal ideations: Secondary | ICD-10-CM | POA: Insufficient documentation

## 2020-04-04 DIAGNOSIS — F19980 Other psychoactive substance use, unspecified with psychoactive substance-induced anxiety disorder: Secondary | ICD-10-CM | POA: Diagnosis present

## 2020-04-04 DIAGNOSIS — Y9389 Activity, other specified: Secondary | ICD-10-CM | POA: Insufficient documentation

## 2020-04-04 DIAGNOSIS — Z79899 Other long term (current) drug therapy: Secondary | ICD-10-CM | POA: Insufficient documentation

## 2020-04-04 LAB — COMPREHENSIVE METABOLIC PANEL
ALT: 79 U/L — ABNORMAL HIGH (ref 0–44)
AST: 101 U/L — ABNORMAL HIGH (ref 15–41)
Albumin: 4.5 g/dL (ref 3.5–5.0)
Alkaline Phosphatase: 66 U/L (ref 38–126)
Anion gap: 12 (ref 5–15)
BUN: 8 mg/dL (ref 6–20)
CO2: 23 mmol/L (ref 22–32)
Calcium: 8.9 mg/dL (ref 8.9–10.3)
Chloride: 104 mmol/L (ref 98–111)
Creatinine, Ser: 0.69 mg/dL (ref 0.61–1.24)
GFR calc Af Amer: >60 mL/min (ref >60)
GFR calc non Af Amer: >60 mL/min (ref >60)
Glucose, Bld: 99 mg/dL (ref 70–99)
Potassium: 3.9 mmol/L (ref 3.5–5.1)
Sodium: 139 mmol/L (ref 135–145)
Total Bilirubin: 0.6 mg/dL (ref 0.3–1.2)
Total Protein: 8.2 g/dL — ABNORMAL HIGH (ref 6.5–8.1)

## 2020-04-04 LAB — CBC
HCT: 44.9 % (ref 39.0–52.0)
Hemoglobin: 15.4 g/dL (ref 13.0–17.0)
MCH: 33.1 pg (ref 26.0–34.0)
MCHC: 34.3 g/dL (ref 30.0–36.0)
MCV: 96.6 fL (ref 80.0–100.0)
Platelets: 230 10*3/uL (ref 150–400)
RBC: 4.65 MIL/uL (ref 4.22–5.81)
RDW: 14.6 % (ref 11.5–15.5)
WBC: 7.2 10*3/uL (ref 4.0–10.5)
nRBC: 0 % (ref 0.0–0.2)

## 2020-04-04 LAB — ETHANOL: Alcohol, Ethyl (B): 256 mg/dL — ABNORMAL HIGH (ref <10)

## 2020-04-04 LAB — ACETAMINOPHEN LEVEL: Acetaminophen (Tylenol), Serum: <10 ug/mL — ABNORMAL LOW (ref 10–30)

## 2020-04-04 LAB — SALICYLATE LEVEL: Salicylate Lvl: 10.3 mg/dL (ref 7.0–30.0)

## 2020-04-04 MED ORDER — TETANUS-DIPHTH-ACELL PERTUSSIS 5-2.5-18.5 LF-MCG/0.5 IM SUSP: 0.5000 mL | Freq: Once | INTRAMUSCULAR | Status: DC

## 2020-04-04 NOTE — ED Notes (Addendum)
Pt to CT.  Via wheelchair with [employer] and CT tech.

## 2020-04-04 NOTE — ED Notes (Addendum)
Patient changed into hospital scrubs by this RN and Allayne Stack NT. Patient's belonging's placed into labeled bag. Patient's belonging's include:  Black sneakers, black wallet with chain attached, pair white socks, open pack cigarettes, lighter, stuffed dog, cell phone, black pants, grey shirt, red shorts, dark boxers.  Patient retained his glasses. Patient retained silver-colored metal ring right 4th finger. Unable to remove ring in triage.
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
## 2020-04-05 DIAGNOSIS — R4589 Other symptoms and signs involving emotional state: Secondary | ICD-10-CM

## 2020-04-05 DIAGNOSIS — R4588 Nonsuicidal self-harm: Secondary | ICD-10-CM

## 2020-04-05 LAB — URINE DRUG SCREEN, QUALITATIVE (ARMC ONLY)
Amphetamines, Ur Screen: NOT DETECTED
Barbiturates, Ur Screen: NOT DETECTED
Benzodiazepine, Ur Scrn: POSITIVE — AB
Cannabinoid 50 Ng, Ur ~~LOC~~: POSITIVE — AB
Cocaine Metabolite,Ur ~~LOC~~: NOT DETECTED
MDMA (Ecstasy)Ur Screen: NOT DETECTED
Methadone Scn, Ur: NOT DETECTED
Opiate, Ur Screen: NOT DETECTED
Phencyclidine (PCP) Ur S: NOT DETECTED
Tricyclic, Ur Screen: NOT DETECTED

## 2020-04-05 MED ORDER — ACETAMINOPHEN 325 MG PO TABS
650.0000 mg | ORAL_TABLET | Freq: Four times a day (QID) | ORAL | Status: DC | PRN
  Administered 2020-04-05: 650 mg via ORAL
  Filled 2020-04-05: qty 2

## 2020-04-05 MED ORDER — ONDANSETRON 4 MG PO TBDP
4.0000 mg | ORAL_TABLET | Freq: Once | ORAL | Status: AC
  Administered 2020-04-05: 10:00:00 4 mg via ORAL
  Filled 2020-04-05: qty 1

## 2020-04-05 NOTE — ED Provider Notes (Signed)
Emergency Medicine Observation Re-evaluation Note  Nathan [name] is a 34 y.o. male, seen on rounds today.  Pt initially presented to the ED for complaints of Homicidal Currently, the patient is resting in no acute distress.  Physical Exam  BP (!) 137/93   Pulse (!) 105   Temp 98.5 F (36.9 C)   Resp 19   Ht 5\' 9"  (1.753 m)   Wt 74.8 kg   SpO2 98%   BMI 24.35 kg/m  Physical Exam  ED Course / MDM  EKG:    I have reviewed the labs performed to date as well as medications administered while in observation.  No events overnight.   Plan  Current plan is for psychiatric disposition. Patient is under full IVC at this time.   , MD 04/05/20 [phone]
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
## 2020-04-05 NOTE — Discharge Instructions (Addendum)
You have been seen in the emergency department for a  psychiatric concern. You have been evaluated both medically as well as psychiatrically. Please follow-up with your outpatient resources provided. Return to the emergency department for any worsening symptoms, or any thoughts of hurting yourself or anyone else so that we may attempt to help you. 

## 2020-04-05 NOTE — ED Notes (Signed)
Dennison Bulla NP and TTS at bedside.

## 2020-04-05 NOTE — BH Assessment (Signed)
Writer spoke with the patient to complete an updated/reassessment. Patient denies SI/HI and AV/H.  Patient does not meet inpatient criteria. 
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
## 2020-04-05 NOTE — ED Notes (Signed)
Pt given back belongings bag 1/1 and is changing back into clothes. [facility] called to take patient home. Pt wants to wait out front.
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
## 2020-04-05 NOTE — ED Notes (Signed)
Pt wondering when he can go home. Pt informed that he is under IVC and the psychiatrist will be around soon to see patients. Pt understands and agrees to remain calm until then. Denies SI/HI at this time. AOx4. Ambulatory with steady gait to bathroom. Gives urine sample. Denies further needs at this time.

## 2020-04-05 NOTE — ED Notes (Signed)
Pt given meal tray.

## 2020-04-09 DIAGNOSIS — F1020 Alcohol dependence, uncomplicated: Secondary | ICD-10-CM | POA: Insufficient documentation

## 2020-04-09 DIAGNOSIS — F430 Acute stress reaction: Secondary | ICD-10-CM | POA: Insufficient documentation

## 2020-04-10 DIAGNOSIS — F1410 Cocaine abuse, uncomplicated: Secondary | ICD-10-CM | POA: Insufficient documentation

## 2020-04-10 DIAGNOSIS — F1220 Cannabis dependence, uncomplicated: Secondary | ICD-10-CM | POA: Insufficient documentation

## 2020-04-12 ENCOUNTER — Emergency Department: Payer: Self-pay

## 2020-04-12 ENCOUNTER — Emergency Department
Admission: EM | Admit: 2020-04-12 | Discharge: 2020-04-12 | Payer: Self-pay | Attending: Emergency Medicine | Admitting: Emergency Medicine

## 2020-04-12 ENCOUNTER — Other Ambulatory Visit: Payer: Self-pay

## 2020-04-12 DIAGNOSIS — S0101XA Laceration without foreign body of scalp, initial encounter: Secondary | ICD-10-CM | POA: Insufficient documentation

## 2020-04-12 DIAGNOSIS — Y9389 Activity, other specified: Secondary | ICD-10-CM | POA: Insufficient documentation

## 2020-04-12 DIAGNOSIS — F10920 Alcohol use, unspecified with intoxication, uncomplicated: Secondary | ICD-10-CM | POA: Insufficient documentation

## 2020-04-12 DIAGNOSIS — Y92810 Car as the place of occurrence of the external cause: Secondary | ICD-10-CM | POA: Insufficient documentation

## 2020-04-12 DIAGNOSIS — W2209XA Striking against other stationary object, initial encounter: Secondary | ICD-10-CM | POA: Insufficient documentation

## 2020-04-12 DIAGNOSIS — Y999 Unspecified external cause status: Secondary | ICD-10-CM | POA: Insufficient documentation

## 2020-04-12 DIAGNOSIS — F17210 Nicotine dependence, cigarettes, uncomplicated: Secondary | ICD-10-CM | POA: Insufficient documentation

## 2020-04-12 MED ORDER — ACETAMINOPHEN 325 MG PO TABS
650.0000 mg | ORAL_TABLET | Freq: Once | ORAL | Status: AC
  Administered 2020-04-12: 650 mg via ORAL

## 2020-04-12 MED ORDER — LIDOCAINE-EPINEPHRINE 2 %-1:100000 IJ SOLN
1.7000 mL | Freq: Once | INTRAMUSCULAR | Status: DC
  Filled 2020-04-12: qty 1.7

## 2020-04-12 MED ORDER — ACETAMINOPHEN 325 MG PO TABS
ORAL_TABLET | ORAL | Status: AC
  Filled 2020-04-12: qty 2

## 2020-04-12 MED ORDER — LIDOCAINE-EPINEPHRINE (PF) 2 %-1:200000 IJ SOLN: 20.0000 mL | Freq: Once | INTRAMUSCULAR | Status: DC

## 2020-04-12 NOTE — ED Notes (Signed)
Pt transported to CT at this time.
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
## 2020-04-12 NOTE — ED Notes (Signed)
Pt refusing to sign at d/c. Discharge instructions and directions for follow-up provided to Deputy to relay to jail.
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
## 2020-05-17 ENCOUNTER — Other Ambulatory Visit: Payer: Self-pay

## 2020-05-17 ENCOUNTER — Emergency Department
Admission: EM | Admit: 2020-05-17 | Discharge: 2020-05-17 | Disposition: A | Discharge status: Home / Self Care | Payer: Self-pay | Attending: Emergency Medicine | Admitting: Emergency Medicine

## 2020-05-17 ENCOUNTER — Emergency Department: Payer: Self-pay

## 2020-05-17 DIAGNOSIS — R519 Headache, unspecified: Secondary | ICD-10-CM

## 2020-05-17 DIAGNOSIS — H9313 Tinnitus, bilateral: Secondary | ICD-10-CM | POA: Insufficient documentation

## 2020-05-17 LAB — BASIC METABOLIC PANEL
Anion gap: 9 (ref 5–15)
BUN: 14 mg/dL (ref 6–20)
CO2: 21 mmol/L — ABNORMAL LOW (ref 22–32)
Calcium: 8.8 mg/dL — ABNORMAL LOW (ref 8.9–10.3)
Chloride: 105 mmol/L (ref 98–111)
Creatinine, Ser: 0.68 mg/dL (ref 0.61–1.24)
GFR calc Af Amer: >60 mL/min (ref >60)
GFR calc non Af Amer: >60 mL/min (ref >60)
Glucose, Bld: 102 mg/dL — ABNORMAL HIGH (ref 70–99)
Potassium: 3.9 mmol/L (ref 3.5–5.1)
Sodium: 135 mmol/L (ref 135–145)

## 2020-05-17 LAB — URINALYSIS, COMPLETE (UACMP) WITH MICROSCOPIC
Bacteria, UA: NONE SEEN
Bilirubin Urine: NEGATIVE
Glucose, UA: NEGATIVE mg/dL
Hgb urine dipstick: NEGATIVE
Ketones, ur: NEGATIVE mg/dL
Leukocytes,Ua: NEGATIVE
Nitrite: NEGATIVE
Protein, ur: NEGATIVE mg/dL
Specific Gravity, Urine: 1.015 (ref 1.005–1.030)
Squamous Epithelial / HPF: NONE SEEN (ref 0–5)
pH: 7 (ref 5.0–8.0)

## 2020-05-17 LAB — CBC
HCT: 41.7 % (ref 39.0–52.0)
Hemoglobin: 14.7 g/dL (ref 13.0–17.0)
MCH: 32.4 pg (ref 26.0–34.0)
MCHC: 35.3 g/dL (ref 30.0–36.0)
MCV: 91.9 fL (ref 80.0–100.0)
Platelets: 197 10*3/uL (ref 150–400)
RBC: 4.54 MIL/uL (ref 4.22–5.81)
RDW: 12.3 % (ref 11.5–15.5)
WBC: 7.2 10*3/uL (ref 4.0–10.5)
nRBC: 0 % (ref 0.0–0.2)

## 2020-05-17 MED ORDER — METOCLOPRAMIDE HCL 10 MG PO TABS
10.0000 mg | ORAL_TABLET | Freq: Three times a day (TID) | ORAL | 1 refills | Status: DC | PRN
Start: 2020-05-17 — End: 2020-06-26

## 2020-05-17 MED ORDER — DIPHENHYDRAMINE HCL 50 MG/ML IJ SOLN
25.0000 mg | Freq: Once | INTRAMUSCULAR | Status: AC
  Administered 2020-05-17: 25 mg via INTRAVENOUS
  Filled 2020-05-17: qty 1

## 2020-05-17 MED ORDER — KETOROLAC TROMETHAMINE 30 MG/ML IJ SOLN
30.0000 mg | Freq: Once | INTRAMUSCULAR | Status: AC
  Administered 2020-05-17: 30 mg via INTRAVENOUS
  Filled 2020-05-17: qty 1

## 2020-05-17 MED ORDER — ONDANSETRON HCL 4 MG/2ML IJ SOLN: 4.0000 mg | Freq: Once | INTRAMUSCULAR | Status: DC

## 2020-05-17 MED ORDER — SODIUM CHLORIDE 0.9 % IV SOLN: Freq: Once | INTRAVENOUS | Status: DC

## 2020-05-17 MED ORDER — METOCLOPRAMIDE HCL 5 MG/ML IJ SOLN
10.0000 mg | Freq: Once | INTRAMUSCULAR | Status: AC
  Administered 2020-05-17: 10 mg via INTRAVENOUS
  Filled 2020-05-17: qty 2

## 2020-05-17 NOTE — ED Provider Notes (Signed)
ER Provider Note       Time seen: 4:50 PM    I have reviewed the vital signs and the nursing notes.  HISTORY   Chief Complaint Headache and ringing in ears    HPI Nathan [name] is a 34 y.o. male with a history of bipolar disorder, chronic headache, depression, alcohol abuse who presents today for headache with ringing in his ears.  Headache he describes as pressure.  He has had nausea.  He has light sensitivity.  Patient states this has not happened to him in some time.  Past Medical History:  Diagnosis Date  . Bipolar 1 disorder (HCC)   . Chronic headache   . Depression 03/30/2020  . ETOH abuse 03/30/2020  . 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 Horseshoe kidney   . Marijuana smoker 03/30/2020    Past Surgical History:  Procedure Laterality Date  . TONSILLECTOMY      Allergies Geodon [ziprasidone hcl], Valproic acid, and Levofloxacin  Review of Systems Constitutional: Negative for fever. Cardiovascular: Negative for chest pain. Respiratory: Negative for shortness of breath. Gastrointestinal: Negative for abdominal pain, vomiting and diarrhea. Musculoskeletal: Negative for back pain. Skin: Negative for rash. Neurological: Positive for headache  All systems negative/normal/unremarkable except as stated in the HPI  ____________________________________________   PHYSICAL EXAM:  VITAL SIGNS: Vitals:   05/17/20 1311  BP: 126/80  Pulse: 98  Resp: 18  Temp: 98.2 F (36.8 C)  SpO2: 99%    Constitutional: Alert and oriented. Well appearing and in no distress. Eyes: Conjunctivae are normal. Normal extraocular movements.  Mild photophobia ENT      Head: Normocephalic and atraumatic.      Nose: No congestion/rhinnorhea.      Mouth/Throat: Mucous membranes are moist.      Neck: No stridor. Cardiovascular: Normal rate, regular rhythm. No murmurs, rubs, or gallops. Respiratory: Normal respiratory effort without tachypnea nor retractions. Breath sounds are  clear and equal bilaterally. No wheezes/rales/rhonchi. Gastrointestinal: Soft and nontender. Normal bowel sounds Musculoskeletal: Nontender with normal range of motion in extremities. No lower extremity tenderness nor edema. Neurologic:  Normal speech and language. No gross focal neurologic deficits are appreciated.  Skin:  Skin is warm, dry and intact. No rash noted. Psychiatric: Speech and behavior are normal.  ____________________________________________   LABS (pertinent positives/negatives)  Labs Reviewed  BASIC METABOLIC PANEL - Abnormal; Notable for the following components:      Result Value   CO2 21 (*)    Glucose, Bld 102 (*)    Calcium 8.8 (*)    All other components within normal limits  URINALYSIS, COMPLETE (UACMP) WITH MICROSCOPIC - Abnormal; Notable for the following components:   Color, Urine YELLOW (*)    APPearance CLEAR (*)    All other components within normal limits  CBC  CBG MONITORING, ED    RADIOLOGY  Images were viewed by me CT head IMPRESSION: Normal brain.  No cause for symptoms identified.  DIFFERENTIAL DIAGNOSIS  Migraine, tension headache, dehydration, withdrawal, electrolyte abnormality  ASSESSMENT AND PLAN  Headache   Plan: The patient had presented for a bad headache. Patient's labs were overall reassuring.  He was given a typical headache cocktail including saline, Reglan, Toradol and Benadryl.  Currently feeling better.  He is cleared for outpatient follow-up.  Daryel November MD    Note: This note was generated in part or whole with voice recognition software. Voice recognition is usually quite accurate but there are transcription errors that can  and very often do occur. I apologize for any typographical errors that were not detected and corrected.     Earleen Newport, MD 05/17/20 [phone]

## 2020-05-17 NOTE — ED Triage Notes (Signed)
Pt comes into the ED via EMS from home with c/o sudden onset chest pain, SOB, ringing in the ears with nausea and HA. VSS, 2mg  zofran IV and of fentanyl , NS, #20g RFA.

## 2020-05-17 NOTE — ED Notes (Signed)
This RN walked by room and saw room empty with lights on and IV laying in bed- spoke with on coming nurse Meghan who states she saw him walking down hallway

## 2020-05-17 NOTE — ED Notes (Signed)
Pt states he has work at [employer] and does not want the IV fluids- pt provided phone to call work

## 2020-05-17 NOTE — ED Triage Notes (Signed)
Pt comes POV after walking to mailbox and feeling like he was going to pass out. Pt states "it suddenly felt like I was looking at the sun" and states that his ears are ringing and he has head pressure. Pt AOx4. Did not pass out. Did not hit head.

## 2020-05-18 ENCOUNTER — Emergency Department
Admission: EM | Admit: 2020-05-18 | Discharge: 2020-05-19 | Disposition: A | Discharge status: Rehabilitation Facility | Payer: Self-pay | Attending: Emergency Medicine | Admitting: Emergency Medicine

## 2020-05-18 ENCOUNTER — Other Ambulatory Visit: Payer: Self-pay

## 2020-05-18 DIAGNOSIS — F1910 Other psychoactive substance abuse, uncomplicated: Secondary | ICD-10-CM

## 2020-05-18 DIAGNOSIS — F1120 Opioid dependence, uncomplicated: Secondary | ICD-10-CM | POA: Insufficient documentation

## 2020-05-18 DIAGNOSIS — Z79899 Other long term (current) drug therapy: Secondary | ICD-10-CM | POA: Insufficient documentation

## 2020-05-18 DIAGNOSIS — Z20822 Contact with and (suspected) exposure to covid-19: Secondary | ICD-10-CM | POA: Insufficient documentation

## 2020-05-18 DIAGNOSIS — F17210 Nicotine dependence, cigarettes, uncomplicated: Secondary | ICD-10-CM | POA: Insufficient documentation

## 2020-05-18 LAB — CBC
HCT: 42.8 % (ref 39.0–52.0)
Hemoglobin: 15.1 g/dL (ref 13.0–17.0)
MCH: 32.4 pg (ref 26.0–34.0)
MCHC: 35.3 g/dL (ref 30.0–36.0)
MCV: 91.8 fL (ref 80.0–100.0)
Platelets: 201 10*3/uL (ref 150–400)
RBC: 4.66 MIL/uL (ref 4.22–5.81)
RDW: 12.9 % (ref 11.5–15.5)
WBC: 9.4 10*3/uL (ref 4.0–10.5)
nRBC: 0 % (ref 0.0–0.2)

## 2020-05-18 LAB — COMPREHENSIVE METABOLIC PANEL
ALT: 57 U/L — ABNORMAL HIGH (ref 0–44)
AST: 41 U/L (ref 15–41)
Albumin: 4.2 g/dL (ref 3.5–5.0)
Alkaline Phosphatase: 66 U/L (ref 38–126)
Anion gap: 9 (ref 5–15)
BUN: 11 mg/dL (ref 6–20)
CO2: 26 mmol/L (ref 22–32)
Calcium: 9.3 mg/dL (ref 8.9–10.3)
Chloride: 101 mmol/L (ref 98–111)
Creatinine, Ser: 0.56 mg/dL — ABNORMAL LOW (ref 0.61–1.24)
GFR calc Af Amer: >60 mL/min (ref >60)
GFR calc non Af Amer: >60 mL/min (ref >60)
Glucose, Bld: 118 mg/dL — ABNORMAL HIGH (ref 70–99)
Potassium: 4.4 mmol/L (ref 3.5–5.1)
Sodium: 136 mmol/L (ref 135–145)
Total Bilirubin: 0.4 mg/dL (ref 0.3–1.2)
Total Protein: 7.5 g/dL (ref 6.5–8.1)

## 2020-05-18 LAB — URINE DRUG SCREEN, QUALITATIVE (ARMC ONLY)
Amphetamines, Ur Screen: NOT DETECTED
Barbiturates, Ur Screen: NOT DETECTED
Benzodiazepine, Ur Scrn: POSITIVE — AB
Cannabinoid 50 Ng, Ur ~~LOC~~: POSITIVE — AB
Cocaine Metabolite,Ur ~~LOC~~: NOT DETECTED
MDMA (Ecstasy)Ur Screen: NOT DETECTED
Methadone Scn, Ur: NOT DETECTED
Opiate, Ur Screen: NOT DETECTED
Phencyclidine (PCP) Ur S: NOT DETECTED
Tricyclic, Ur Screen: NOT DETECTED

## 2020-05-18 LAB — ETHANOL: Alcohol, Ethyl (B): <10 mg/dL (ref <10)

## 2020-05-18 MED ORDER — ACETAMINOPHEN 325 MG PO TABS
650.0000 mg | ORAL_TABLET | Freq: Once | ORAL | Status: AC
  Administered 2020-05-18: 650 mg via ORAL
  Filled 2020-05-18: qty 2

## 2020-05-18 NOTE — ED Notes (Signed)
Pt is requesting something for his headache

## 2020-05-18 NOTE — ED Provider Notes (Signed)
[facility] Emergency Department Provider Note   ____________________________________________    I have reviewed the triage vital signs and the nursing notes.   HISTORY  Chief Complaint Psychiatric Evaluation     HPI Nathan [name] is a 34 y.o. male with a history of bipolar disorder, depression, substance abuse presents today requesting assistance with heroin detox.  He reports that he needs help with detox.  He is suffering from depression but denies SI or HI.  Has no physical complaints at this time.  Is requesting outpatient facility referral.  Past Medical History:  Diagnosis Date  . Bipolar 1 disorder ([place])   . Chronic headache   . Depression 03/30/2020  . ETOH abuse 03/30/2020  . Hep C w/o coma, chronic ([place])   . Horseshoe kidney   . Marijuana smoker 03/30/2020    Patient Active Problem List   Diagnosis Date Noted  . Non-suicidal self harm 04/05/2020  . Bipolar 1 disorder, depressed ([place]) 01/31/2018  . Bipolar 1 disorder, mixed, severe ([place]) 04/22/2016  . Alcohol abuse 04/22/2016  . Suicidal ideation 04/22/2016  . Tobacco use disorder 04/22/2016  . 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 TONSILLECTOMY      Prior to Admission medications   Medication Sig Start Date End Date Taking? Authorizing Provider  diazepam (VALIUM) 5 MG tablet Take 2.5 mg by mouth every 12 (twelve) hours as needed for anxiety.     [provider]  Ledipasvir-Sofosbuvir (HARVONI) 90-400 MG TABS Take by mouth 1 day or 1 dose.    [provider]  metoCLOPramide (REGLAN) 10 MG tablet Take 1 tablet (10 mg total) by mouth every 6 (six) hours as needed for nausea (nausea/headache). 02/17/20   Drenda Freeze, MD  metoCLOPramide (REGLAN) 10 MG tablet Take 1 tablet (10 mg total) by mouth every 8 (eight) hours as needed for nausea or vomiting (also for headache).  05/17/20   Earleen Newport, MD  QUEtiapine (SEROQUEL) 50 MG tablet Take 1 tablet (50 mg total) by mouth at bedtime. 01/31/18   Clapacs, Madie Reno, MD  traZODone (DESYREL) 50 MG tablet Take 25 mg by mouth at bedtime.    [provider]     Allergies Geodon [ziprasidone hcl], Valproic acid, and Levofloxacin  Family History  Problem Relation Age of Onset  . Hypertension Mother   . Diabetes Mother   . Hypertension Father   . Heart failure Father   . Diabetes Father     Social History Social History   Tobacco Use  . Smoking status: Current Every Day Smoker    Packs/day: 1.50    Types: Cigarettes  . Smokeless tobacco: Current User    Types: Chew  Substance Use Topics  . Alcohol use: Yes    Alcohol/week: 6.0 standard drinks    Types: 6 Cans of beer per week  . Drug use: Yes    Types: IV    Comment: heroine    Review of Systems  Constitutional: No fever/chills Eyes: No visual changes.  ENT: No sore throat. Cardiovascular: Denies chest pain. Respiratory: Denies shortness of breath. Gastrointestinal: No abdominal pain.     Genitourinary: Negative for dysuria. Musculoskeletal: Negative for back pain. Skin: Negative for rash. Neurological: Negative for headaches   ____________________________________________   PHYSICAL EXAM:  VITAL SIGNS: ED Triage Vitals [05/18/20 1651]  Enc Vitals Group     BP (!) 136/92  Pulse Rate 97     Resp 18     Temp 98.2 F (36.8 C)     Temp Source Oral     SpO2 97 %     Weight 65 kg (143 lb 4.8 oz)     Height 1.753 m (5\' 9" )     Head Circumference      Peak Flow      Pain Score 0     Pain Loc      Pain Edu?      Excl. in GC?     Constitutional: Alert and oriented.  Nose: No congestion/rhinnorhea. Mouth/Throat: Mucous membranes are moist.    Cardiovascular: Normal rate, regular rhythm.  Good peripheral circulation. Respiratory: Normal respiratory effort.  No retractions.  Gastrointestinal: Soft and nontender.  No distention.  No CVA tenderness.  Musculoskeletal:  Warm and well perfused Neurologic:  Normal speech and language. No gross focal neurologic deficits are appreciated.  Skin:  Skin is warm, dry and intact. No rash noted. Psychiatric: Mood and affect are normal. Speech and behavior are normal.  ____________________________________________   LABS (all labs ordered are listed, but only abnormal results are displayed)  Labs Reviewed  COMPREHENSIVE METABOLIC PANEL - Abnormal; Notable for the following components:      Result Value   Glucose, Bld 118 (*)    Creatinine, Ser 0.56 (*)    ALT 57 (*)    All other components within normal limits  URINE DRUG SCREEN, QUALITATIVE ([facility] ONLY) - Abnormal; Notable for the following components:   Cannabinoid 50 Ng, Ur [place] POSITIVE (*)    Benzodiazepine, Ur Scrn POSITIVE (*)    All other components within normal limits  SARS CORONAVIRUS 2 BY RT PCR (HOSPITAL ORDER, PERFORMED IN [place] [facility])  ETHANOL  CBC   ____________________________________________  EKG  None ____________________________________________  RADIOLOGY  None ____________________________________________   PROCEDURES  Procedure(s) performed: No  Procedures   Critical Care performed: No ____________________________________________   INITIAL IMPRESSION / ASSESSMENT AND PLAN / ED COURSE  Pertinent labs & imaging results that were available during my care of the patient were reviewed by me and considered in my medical decision making (see chart for details).  Patient presents with request for heroin detox.  He denies SI or HI, and have consulted TTS.  He is medically cleared for detox program    ____________________________________________   FINAL CLINICAL IMPRESSION(S) / ED DIAGNOSES  Final diagnoses:  Polysubstance abuse (HCC)        Note:  This document was prepared using Dragon voice recognition software and may include unintentional  dictation errors.   , MD 05/18/20 [phone]

## 2020-05-18 NOTE — ED Notes (Signed)
Pt provided with cup of water, tylenol, two packs of graham crackers, two packs of saltines

## 2020-05-18 NOTE — ED Notes (Signed)
Pt belongings: black boots, socks, jeans, black belt, tshirt, black jacket, red shorts, black boxers, black wallet, cigarettes with lighter, black mask.   Calm and cooperative during change out process. Pt provided hospital mask.

## 2020-05-18 NOTE — ED Triage Notes (Signed)
Pt comes POV after "increased depression" and relapsing on heroin last night. Pt denies SI/HI/AHV. Pt states he "has a lot on him right now". Seen here yesterday for dizziness and received blood and labs then.

## 2020-05-19 LAB — SARS CORONAVIRUS 2 BY RT PCR (HOSPITAL ORDER, PERFORMED IN ~~LOC~~ HOSPITAL LAB): SARS Coronavirus 2: NEGATIVE

## 2020-05-19 MED ORDER — CALCIUM CARBONATE ANTACID 500 MG PO CHEW
1.0000 | CHEWABLE_TABLET | Freq: Four times a day (QID) | ORAL | Status: DC | PRN
  Administered 2020-05-19: 200 mg via ORAL
  Filled 2020-05-19 (×3): qty 1

## 2020-05-19 MED ORDER — LORAZEPAM 1 MG PO TABS
1.0000 mg | ORAL_TABLET | Freq: Once | ORAL | Status: AC
  Administered 2020-05-19: 1 mg via ORAL
  Filled 2020-05-19: qty 1

## 2020-05-19 MED ORDER — IBUPROFEN 600 MG PO TABS
600.0000 mg | ORAL_TABLET | Freq: Once | ORAL | Status: AC
  Administered 2020-05-19: 600 mg via ORAL
  Filled 2020-05-19: qty 1

## 2020-05-19 NOTE — ED Notes (Signed)
Hourly rounding reveals patient in room. No complaints, stable, in no acute distress. Q15 minute rounds and monitoring via Security Cameras to continue. 

## 2020-05-19 NOTE — BH Assessment (Signed)
RTS referral check:  Nathan [name] confirmed receiving referral and plans to look over it and call back in an hour

## 2020-05-19 NOTE — ED Notes (Addendum)
Robert from [facility] called to say that they can take pt at 4.30pm for detox as bed has became available. EDP notified.

## 2020-05-19 NOTE — ED Notes (Addendum)
Pt discharging to a detox facility, residential treatment services. Discharge teaching done and pt verbalized understanding. Pt signed paper discharge form. Given all his personal belongings. Escorted to lobby, ambulatory and in NAD.

## 2020-05-19 NOTE — ED Notes (Signed)
Pt c/o HA 7/10. Ibuprofen ordered and given.

## 2020-05-19 NOTE — BH Assessment (Signed)
Assessment Note  Nathan [name] is an 34 y.o. male is presenting to [facility] ED voluntarily. Per triage note Pt comes POV after "increased depression" and relapsing on heroin last night. Pt denies SI/HI/AHV. Pt states he "has a lot on him right now". During assessment patient was alert and oriented x4, calm and cooperative, depressed and tearful. Patient reported that he is interested in detox treatment due to a recent relapse of Heroin. Patient reported "I relapsed, I need help with heroin." Patient reported that he uses heroin "4-5 times a day." Patient reported his last date of use "last night." Patient reports amounts that he uses "1/2 gram." Patient reports he uses heroin via injection and inhaling. Patient reported "I was clean for 1-2 years" and has been using heroin for "2 months straight." Patient reported that he has current depression but denies SI. Patient denies current withdrawal symptoms. Patient denies current SI/HI/AH/VH. Patient UDS positive for Cannabinoids and Benzodiazapines.   Patient currently meets criteria for Inpatient Detox treatment, patient verbally gave consent for referral to be sent for detox.   Diagnosis: Opioid Use Disorder Severe, Depression by history  Past Medical History:  Past Medical History:  Diagnosis Date  . Bipolar 1 disorder ([place])   . Chronic headache   . Depression 03/30/2020  . ETOH abuse 03/30/2020  . Hep C w/o coma, chronic ([place])   . Horseshoe kidney   . Marijuana smoker 03/30/2020    Past Surgical History:  Procedure Laterality Date  . TONSILLECTOMY      Family History:  Family History  Problem Relation Age of Onset  . Hypertension Mother   . Diabetes Mother   . Hypertension Father   . Heart failure Father   . Diabetes Father     Social History:  reports that he has been smoking cigarettes. He has been smoking about 1.50 packs per day. His smokeless tobacco use includes chew. He reports current alcohol use of about 6.0 standard  drinks of alcohol per week. He reports current drug use. Drug: IV.  Additional Social History:  Alcohol / Drug Use Pain Medications: See MAR Prescriptions: See MAR Over the Counter: See MAR History of alcohol / drug use?: Yes Substance #1 Name of Substance 1: Heroin  CIWA: CIWA-Ar BP: (!) 136/92 Pulse Rate: 97 COWS: Clinical Opiate Withdrawal Scale (COWS) Resting Pulse Rate: Pulse Rate 81-100 Sweating: No report of chills or flushing Restlessness: Able to sit still Pupil Size: Pupils pinned or normal size for room light Bone or Joint Aches: Not present Runny Nose or Tearing: Not present GI Upset: No GI symptoms Tremor: No tremor Yawning: No yawning Anxiety or Irritability: Patient reports increasing irritability or anxiousness Gooseflesh Skin: Skin is smooth COWS Total Score: 2  Allergies:  Allergies  Allergen Reactions  . Geodon [Ziprasidone Hcl] Other (See Comments)    Seizures  . Valproic Acid   . Levofloxacin Itching    Home Medications: (Not in a hospital admission)   OB/GYN Status:  No LMP for male patient.  General Assessment Data Location of Assessment: [facility] ED TTS Assessment: In system Is this a Tele or Face-to-Face Assessment?: Face-to-Face Is this an Initial Assessment or a Re-assessment for this encounter?: Initial Assessment Patient Accompanied by:: N/A Language Other than English: No Living Arrangements: Other (Comment)(Private Residence) What gender do you identify as?: Male Marital status: Single Living Arrangements: Parent Can pt return to current living arrangement?: Yes Admission Status: Voluntary Is patient capable of signing voluntary admission?: Yes Referral Source: Self/Family/Friend  Insurance type: None  Medical Screening Exam [facility] Walk-in ONLY) Medical Exam completed: Yes  Crisis Care Plan Living Arrangements: Parent Legal Guardian: Other:(Self) Name of Psychiatrist: None Name of Therapist: None  Education Status Is patient  currently in school?: No Is the patient employed, unemployed or receiving disability?: Employed  Risk to self with the past 6 months Suicidal Ideation: No Has patient been a risk to self within the past 6 months prior to admission? : No Suicidal Intent: No Has patient had any suicidal intent within the past 6 months prior to admission? : No Is patient at risk for suicide?: No Suicidal Plan?: No Has patient had any suicidal plan within the past 6 months prior to admission? : No Access to Means: No What has been your use of drugs/alcohol within the last 12 months?: Heroin, Benzos, Marijuana Previous Attempts/Gestures: No How many times?: 0 Other Self Harm Risks: None Triggers for Past Attempts: None known Intentional Self Injurious Behavior: None Family Suicide History: Unknown Recent stressful life event(s): Other (Comment)(None reported) Persecutory voices/beliefs?: No Depression: Yes Depression Symptoms: Tearfulness, Isolating, Feeling worthless/self pity Substance abuse history and/or treatment for substance abuse?: Yes Suicide prevention information given to non-admitted patients: Not applicable  Risk to Others within the past 6 months Homicidal Ideation: No Does patient have any lifetime risk of violence toward others beyond the six months prior to admission? : No Thoughts of Harm to Others: No Current Homicidal Intent: No Current Homicidal Plan: No Access to Homicidal Means: No Identified Victim: None History of harm to others?: No Assessment of Violence: None Noted Violent Behavior Description: None Does patient have access to weapons?: No Criminal Charges Pending?: No Does patient have a court date: No Is patient on probation?: No  Psychosis Hallucinations: None noted Delusions: None noted  Mental Status Report Appearance/Hygiene: In scrubs Eye Contact: Good Motor Activity: Freedom of movement Speech: Logical/coherent Level of Consciousness: Alert Mood:  Depressed Affect: Appropriate to circumstance Anxiety Level: Minimal Thought Processes: Coherent Judgement: Unimpaired Orientation: Person, Place, Time, Situation, Appropriate for developmental age Obsessive Compulsive Thoughts/Behaviors: None  Cognitive Functioning Concentration: Normal Memory: Recent Intact, Remote Intact Is patient IDD: No Insight: Good Impulse Control: Fair Appetite: Good Have you had any weight changes? : No Change Sleep: Decreased Total Hours of Sleep: 0 Vegetative Symptoms: None  ADLScreening [facility] Assessment Services) Patient's cognitive ability adequate to safely complete daily activities?: Yes Patient able to express need for assistance with ADLs?: Yes Independently performs ADLs?: Yes (appropriate for developmental age)  Prior Inpatient Therapy Prior Inpatient Therapy: Yes Prior Therapy Dates: 04/22/2016 Prior Therapy Facilty/Provider(s): [facility] Reason for Treatment: SI  Prior Outpatient Therapy Prior Outpatient Therapy: No Does patient have an ACCT team?: No Does patient have Intensive In-House Services?  : No Does patient have [facility] services? : No Does patient have P4CC services?: No  ADL Screening (condition at time of admission) Patient's cognitive ability adequate to safely complete daily activities?: Yes Is the patient deaf or have difficulty hearing?: No Does the patient have difficulty seeing, even when wearing glasses/contacts?: No Does the patient have difficulty concentrating, remembering, or making decisions?: No Patient able to express need for assistance with ADLs?: Yes Does the patient have difficulty dressing or bathing?: No Independently performs ADLs?: Yes (appropriate for developmental age) Does the patient have difficulty walking or climbing stairs?: No Weakness of Legs: None Weakness of Arms/Hands: None  Home Assistive Devices/Equipment Home Assistive Devices/Equipment: None  Therapy Consults (therapy consults  require a physician order) PT  Evaluation Needed: No OT Evalulation Needed: No SLP Evaluation Needed: No Abuse/Neglect Assessment (Assessment to be complete while patient is alone) Abuse/Neglect Assessment Can Be Completed: Yes Physical Abuse: Denies Verbal Abuse: Denies Sexual Abuse: Denies Exploitation of patient/patient's resources: Denies Self-Neglect: Denies Values / Beliefs Cultural Requests During Hospitalization: None Spiritual Requests During Hospitalization: None Consults Spiritual Care Consult Needed: No Transition of Care Team Consult Needed: No Advance Directives (For Healthcare) Does Patient Have a Medical Advance Directive?: No          Disposition: Patient currently meets criteria for Inpatient Detox treatment, patient verbally gave consent for referral to be sent for detox.  Disposition Initial Assessment Completed for this Encounter: Yes Patient referred to: RTS  On Site Evaluation by:   Reviewed with Physician:    Benay Pike MS LCASA 05/19/2020 12:28 AM

## 2020-05-19 NOTE — ED Notes (Signed)
Hourly rounding reveals patient sleeping in room. No complaints, stable, in no acute distress. Q15 minute rounds and monitoring via Security Cameras to continue. 

## 2020-05-19 NOTE — ED Notes (Signed)
Pt. Transferred to BHU from ED to room after screening for contraband. Report to include Situation, Background, Assessment and Recommendations from [facility]. Pt. Oriented to unit including Q15 minute rounds as well as the security cameras for their protection. Patient is alert and oriented, warm and dry in no acute distress. Patient denies SI, HI, and AVH. Pt. Encouraged to let me know if needs arise.

## 2020-05-19 NOTE — BH Assessment (Addendum)
Referral information for Detox Inpatient Treatment faxed to;   [facility] [phone]) TTS to follow up tomorrow morning 05/19/20 after 8am with admissions, no current beds tonight, potential bed tomorrow.

## 2020-06-26 ENCOUNTER — Other Ambulatory Visit: Payer: Self-pay

## 2020-06-26 ENCOUNTER — Ambulatory Visit: Payer: Self-pay | Admitting: Gerontology

## 2020-06-26 ENCOUNTER — Encounter: Payer: Self-pay | Admitting: Gerontology

## 2020-06-26 VITALS — BP 124/87 | HR 72 | Ht 69.0 in | Wt 142.0 lb

## 2020-06-26 DIAGNOSIS — F17200 Nicotine dependence, unspecified, uncomplicated: Secondary | ICD-10-CM

## 2020-06-26 DIAGNOSIS — G8929 Other chronic pain: Secondary | ICD-10-CM | POA: Insufficient documentation

## 2020-06-26 DIAGNOSIS — R519 Headache, unspecified: Secondary | ICD-10-CM | POA: Insufficient documentation

## 2020-06-26 DIAGNOSIS — Z7689 Persons encountering health services in other specified circumstances: Secondary | ICD-10-CM | POA: Insufficient documentation

## 2020-06-26 DIAGNOSIS — Z8719 Personal history of other diseases of the digestive system: Secondary | ICD-10-CM | POA: Insufficient documentation

## 2020-06-26 MED ORDER — OMEPRAZOLE 20 MG PO CPDR: 20.0000 mg | DELAYED_RELEASE_CAPSULE | Freq: Every day | ORAL | 2 refills | Status: DC

## 2020-06-26 MED ORDER — IBUPROFEN 800 MG PO TABS: 800.0000 mg | ORAL_TABLET | Freq: Every day | ORAL | 0 refills | Status: DC

## 2020-06-26 NOTE — Patient Instructions (Signed)

## 2020-06-26 NOTE — Progress Notes (Signed)
New Patient Office Visit  Subjective:  Patient ID: Nathan [name], male    DOB: May 19, 1986  Age: 34 y.o. MRN: [id number]  CC:  Chief Complaint  Patient presents with  . Establish Care  . Gastroesophageal Reflux  . Headache    result of fractured skull    HPI [facility] [name] is a 34 y.o. male who  presents for establishment of care and evaluation of chronic condition. He currently resides at [facility] for substance abuse.  He was seen at the ED on 05/18/2020 for heroin detox. He reports expereincing chronic headache and heart burn. He verbalized prior head trauma from falling off a tree about 2 years. He also verbalized head butting various objects and people over the years. He rates his headache as an 8/10 and describes it as non-radiating and  pounding. He states that sunlight and stress worsens the headache while taking 800 mg ibuprofen OTC relieves his headaches. He also verbalized having a history of acid reflux. He states he usually takes Nexium OTC which has relived his acid reflux. He reports smoking about 4-5 packs of cigarettes per day and verbalized no intention of quitting. He denies alcohol consumption. He denies chest pain, shortness of breath and dyspnea with exertion. Overall, he states that he is doing well and offers no further complaint.   Past Medical History:  Diagnosis Date  . Bipolar 1 disorder ([place])   . Chronic headache   . Depression 03/30/2020  . ETOH abuse 03/30/2020  . Hep C w/o coma, chronic ([place])   . Horseshoe kidney   . Marijuana smoker 03/30/2020    Past Surgical History:  Procedure Laterality Date  . TONSILLECTOMY      Family History  Problem Relation Age of Onset  . Hypertension Mother   . Diabetes Mother   . Hypertension Father   . Heart failure Father   . Diabetes Father     Social History   Socioeconomic History  . Marital status: Single    Spouse name: Not on file  . Number of children: Not on file  . Years of education: Not  on file  . Highest education level: Not on file  Occupational History  . Not on file  Tobacco Use  . Smoking status: Current Every Day Smoker    Packs/day: 1.50    Types: Cigarettes  . Smokeless tobacco: Current User    Types: Chew  Vaping Use  . Vaping Use: Never used  Substance and Sexual Activity  . Alcohol use: Not Currently    Alcohol/week: 6.0 standard drinks    Types: 6 Cans of beer per week  . Drug use: Not Currently    Types: IV    Comment: heroine  . Sexual activity: Yes  Other Topics Concern  . Not on file  Social History Narrative  . Not on file   Social Determinants of Health   Financial Resource Strain:   . Difficulty of Paying Living Expenses:   Food Insecurity:   . Worried About Charity fundraiser in the Last Year:   . Arboriculturist in the Last Year:   Transportation Needs:   . Film/video editor (Medical):   Marland Kitchen Lack of Transportation (Non-Medical):   Physical Activity:   . Days of Exercise per Week:   . Minutes of Exercise per Session:   Stress:   . Feeling of Stress :   Social Connections:   . Frequency of Communication with Friends and Family:   .  Frequency of Social Gatherings with Friends and Family:   . Attends Religious Services:   . Active Member of Clubs or Organizations:   . Attends Archivist Meetings:   Marland Kitchen Marital Status:   Intimate Partner Violence:   . Fear of Current or Ex-Partner:   . Emotionally Abused:   Marland Kitchen Physically Abused:   . Sexually Abused:     ROS Review of Systems  Constitutional: Negative.   HENT: Negative.   Eyes: Negative.   Respiratory: Negative.   Cardiovascular: Negative.   Gastrointestinal: Negative.   Endocrine: Negative.   Genitourinary: Negative.   Musculoskeletal: Negative.   Skin: Negative.   Allergic/Immunologic: Positive for food allergies (.Onion, anaphylaxis ).  Neurological: Positive for headaches (.Chronic headaches ).  Psychiatric/Behavioral: Negative.     Objective:    Today's Vitals: BP 124/87 (BP Location: Left Arm, Patient Position: Sitting)   Pulse 72   Ht _0  (1.753 m)   Wt 142 lb (64.4 kg)   SpO2 96%   BMI 20.97 kg/m   Physical Exam Constitutional:      Appearance: He is well-developed and normal weight.  HENT:     Head: Normocephalic and atraumatic.  Eyes:     Pupils: Pupils are equal, round, and reactive to light.  Cardiovascular:     Rate and Rhythm: Normal rate and regular rhythm.     Heart sounds: Normal heart sounds.  Pulmonary:     Effort: Pulmonary effort is normal.     Breath sounds: Normal breath sounds.  Abdominal:     General: Bowel sounds are normal.     Palpations: Abdomen is soft.  Musculoskeletal:        General: Normal range of motion.     Cervical back: Normal range of motion and neck supple.  Skin:    General: Skin is warm.  Neurological:     Mental Status: He is alert and oriented to person, place, and time.  Psychiatric:        Mood and Affect: Mood normal.        Speech: Speech normal.        Behavior: Behavior normal.     Assessment & Plan:   Problem List Items Addressed This Visit      Other   Tobacco use disorder   Chronic headache   Relevant Medications   ibuprofen (ADVIL) 800 MG tablet   Encounter to establish care - Primary   Relevant Orders   CBC w/Diff   Comp Met (CMET)   TSH   Urinalysis   History of gastroesophageal reflux (GERD)   Relevant Medications   omeprazole (PRILOSEC) 20 MG capsule      Outpatient Encounter Medications as of 06/26/2020  Medication Sig  . ibuprofen (ADVIL) 800 MG tablet Take 1 tablet (800 mg total) by mouth daily.  Marland Kitchen omeprazole (PRILOSEC) 20 MG capsule Take 1 capsule (20 mg total) by mouth daily.  . [DISCONTINUED] diazepam (VALIUM) 5 MG tablet Take 2.5 mg by mouth every 12 (twelve) hours as needed for anxiety.   . [DISCONTINUED] Ledipasvir-Sofosbuvir (HARVONI) 90-400 MG TABS Take by mouth 1 day or 1 dose.  . [DISCONTINUED] metoCLOPramide (REGLAN) 10 MG  tablet Take 1 tablet (10 mg total) by mouth every 6 (six) hours as needed for nausea (nausea/headache).  . [DISCONTINUED] metoCLOPramide (REGLAN) 10 MG tablet Take 1 tablet (10 mg total) by mouth every 8 (eight) hours as needed for nausea or vomiting (also for headache).  . [DISCONTINUED] QUEtiapine (SEROQUEL)  50 MG tablet Take 1 tablet (50 mg total) by mouth at bedtime.  . [DISCONTINUED] traZODone (DESYREL) 50 MG tablet Take 25 mg by mouth at bedtime.   No facility-administered encounter medications on file as of 06/26/2020.    Assessment and Plan   1. Tobacco use disorder He was strongly advised to quit smoking   2. Chronic nonintractable headache, unspecified headache type He will start Ibuprofen and advised to take medication as prescribed. He was also educated on he side effects of medication an was informed to notify clinic for adverse effects. - ibuprofen (ADVIL) 800 MG tablet; Take 1 tablet (800 mg total) by mouth daily.  Dispense: 30 tablet; Refill: 0 . 3. Encounter to establish care Routine lab would be check  - CBC w/Diff; Future - Comp Met (CMET); Future - TSH; Future - Urinalysis; Future  4. History of gastroesophageal reflux (GERD) He will start Omeprazole. He was advised to take medication as prescribed. He was also educated on he side effects of medication an was informed to notify clinic for adverse effects.. - omeprazole (PRILOSEC) 20 MG capsule; Take 1 capsule (20 mg total) by mouth daily.  Dispense: 30 capsule; Refill: 2   Follow-up: Return in about 27 days (around 07/23/2020), or if symptoms worsen or fail to improve.   Carney Corners, RN

## 2020-07-02 ENCOUNTER — Other Ambulatory Visit: Payer: Self-pay

## 2020-07-02 ENCOUNTER — Ambulatory Visit: Payer: Self-pay | Admitting: Pharmacy Technician

## 2020-07-02 DIAGNOSIS — Z79899 Other long term (current) drug therapy: Secondary | ICD-10-CM

## 2020-07-02 NOTE — Progress Notes (Signed)
Completed financial assistance application for [name] due to recent hospital visit. Patient agreed to be responsible for gathering financial information and forwarding to appropriate department in .    Completed [facility] application and contract.  Patient agreed to all terms of the [facility] contract.    Patient approved to receive medication assistance at [facility] until time for re-certification in 2022, and as long as eligibility criteria continues to be met.    Provided patient with community resource material based on his particular needs.    Takeyah Wieman J. Tanny Harnack Care Manager [facility]  

## 2020-07-16 ENCOUNTER — Other Ambulatory Visit: Payer: Self-pay

## 2020-07-21 ENCOUNTER — Other Ambulatory Visit: Payer: Self-pay

## 2020-07-21 ENCOUNTER — Emergency Department
Admission: EM | Admit: 2020-07-21 | Discharge: 2020-07-22 | Disposition: A | Discharge status: Home / Self Care | Payer: Self-pay | Attending: Student in an Organized Health Care Education/Training Program | Admitting: Student in an Organized Health Care Education/Training Program

## 2020-07-21 ENCOUNTER — Encounter: Payer: Self-pay | Admitting: *Deleted

## 2020-07-21 DIAGNOSIS — F1290 Cannabis use, unspecified, uncomplicated: Secondary | ICD-10-CM | POA: Insufficient documentation

## 2020-07-21 DIAGNOSIS — F19939 Other psychoactive substance use, unspecified with withdrawal, unspecified: Secondary | ICD-10-CM | POA: Insufficient documentation

## 2020-07-21 DIAGNOSIS — F1910 Other psychoactive substance abuse, uncomplicated: Secondary | ICD-10-CM

## 2020-07-21 DIAGNOSIS — F17210 Nicotine dependence, cigarettes, uncomplicated: Secondary | ICD-10-CM | POA: Insufficient documentation

## 2020-07-21 NOTE — ED Triage Notes (Signed)
Pt requesting drug detox  Pt did drugs 2 days ago and now requesting detox.   Denies etoh use.  Denies HI or SI.  Pt dropped off by bpd officer.    Pt calm and cooperative.

## 2020-07-22 MED ORDER — ACETAMINOPHEN 325 MG PO TABS
650.0000 mg | ORAL_TABLET | Freq: Once | ORAL | Status: AC
  Administered 2020-07-22: 650 mg via ORAL
  Filled 2020-07-22: qty 2

## 2020-07-22 NOTE — ED Notes (Signed)
Patient resting with eyes closed. Respirations even and non labored. Will continue to monitor.  

## 2020-07-22 NOTE — ED Provider Notes (Signed)
[facility] Emergency Department Provider Note    First MD Initiated Contact with Patient 07/22/20 0001     (approximate)  I have reviewed the triage vital signs and the nursing notes.   HISTORY  Chief Complaint detox    HPI Nathan [name] is a 34 y.o. male below listed past medical history presents to the ER requesting help with detox.  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  States he is also hearing some voices but denies any SI or HI.  Denies any command hallucinations.  States has not been following up with outpatient psychiatry.  Denies any alcohol use.    Past Medical History:  Diagnosis Date  . Bipolar 1 disorder ([facility])   . Chronic headache   . Depression 03/30/2020  . ETOH abuse 03/30/2020  . 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 Horseshoe kidney   . Marijuana smoker 03/30/2020   Family History  Problem Relation Age of Onset  . Hypertension Mother   . Diabetes Mother   . Hypertension Father   . Heart failure Father   . Diabetes Father    Past Surgical History:  Procedure Laterality Date  . TONSILLECTOMY     Patient Active Problem List   Diagnosis Date Noted  . Chronic headache 06/26/2020  . Encounter to establish care 06/26/2020  . History of gastroesophageal reflux (GERD) 06/26/2020  . Non-suicidal self harm 04/05/2020  . Bipolar 1 disorder, depressed ([facility]) 01/31/2018  . Bipolar 1 disorder, mixed, severe (H[facility]) 04/22/2016  . Alcohol abuse 04/22/2016  . Suicidal ideation 04/22/2016  . Tobacco use disorder 04/22/2016  . 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 Authorizing Provider  ibuprofen (ADVIL) 800 MG tablet Take 1 tablet (800 mg total) by mouth daily. 06/26/20   Iloabachie, Chioma E, NP  omeprazole (PRILOSEC) 20 MG capsule Take 1 capsule (20 mg total) by mouth daily. 06/26/20   Iloabachie, Chioma E, NP     Allergies Geodon [ziprasidone hcl], Valproic acid, and Levofloxacin    Social History Social History   Tobacco Use  . Smoking status: Current Every Day Smoker    Packs/day: 1.50    Types: Cigarettes  . Smokeless tobacco: Current User    Types: Chew  Vaping Use  . Vaping Use: Never used  Substance Use Topics  . Alcohol use: Not Currently    Alcohol/week: 6.0 standard drinks    Types: 6 Cans of beer per week  . Drug use: Not Currently    Types: IV    Comment: heroine    Review of Systems Patient denies headaches, rhinorrhea, blurry vision, numbness, shortness of breath, chest pain, edema, cough, abdominal pain, nausea, vomiting, diarrhea, dysuria, fevers, rashes or hallucinations unless otherwise stated above in HPI. ____________________________________________   PHYSICAL EXAM:  VITAL SIGNS: Vitals:   07/21/20 2331  BP: 126/76  Pulse: (!) 102  Resp: 18  Temp: 98.1 F (36.7 C)  SpO2: 98%    Constitutional: fatigued appearing, oriented x 3 Eyes: Conjunctivae are normal.  Head: Atraumatic. Nose: No congestion/rhinnorhea. Mouth/Throat: Mucous membranes are moist.   Neck: No stridor. Painless ROM.  Cardiovascular: Normal rate, regular rhythm. Grossly normal heart sounds.  Good peripheral circulation. Respiratory: Normal respiratory effort.  No retractions. Lungs CTAB. Gastrointestinal: Soft and nontender. No distention. No abdominal bruits. No CVA tenderness. Genitourinary:  Musculoskeletal: No lower  extremity tenderness nor edema.  No joint effusions. Neurologic:  Normal speech and language. No gross focal neurologic deficits are appreciated. No facial droop Skin:  Skin is warm, dry and intact. No rash noted. Psychiatric: Mood and affect are normal. Speech and behavior are normal.  ____________________________________________   LABS (all labs ordered are listed, but only abnormal results are displayed)  No results found for this or any previous visit  (from the past 24 hour(s)). ____________________________________________ ____________________________________________  RADIOLOGY   ____________________________________________   PROCEDURES  Procedure(s) performed:  Procedures    Critical Care performed: no ____________________________________________   INITIAL IMPRESSION / ASSESSMENT AND PLAN / ED COURSE  Pertinent labs & imaging results that were available during my care of the patient were reviewed by me and considered in my medical decision making (see chart for details).   DDX: Psychosis, delirium, medication effect, noncompliance, polysubstance abuse, Si, Hi, depression   Nathan [name] is a 34 y.o. who presents to the ED with presentation as described above.  Patient is nontoxic-appearing appearing protecting his airway hemodynamically stable.  Has no focal complaints.  I do not see any indication for IVC.  He denies any SI or HI.  He is requesting detox from meth which I informed her we do not have inpatient but we can refer him to outpatient detox.  Patient is requesting somewhere to rest which will allow him to do so but will be discharged in the morning.     The patient was evaluated in Emergency Department today for the symptoms described in the history of present illness. He/she was evaluated in the context of the global COVID-19 pandemic, which necessitated consideration that the patient might be at risk for infection with the SARS-CoV-2 virus that causes COVID-19. Institutional protocols and algorithms that pertain to the evaluation of patients at risk for COVID-19 are in a state of rapid change based on information released by regulatory bodies including the CDC and federal and state organizations. These policies and algorithms were followed during the patient's care in the ED.  As part of my medical decision making, I reviewed the following data within the electronic MEDICAL RECORD NUMBER Nursing notes reviewed and  incorporated, Labs reviewed, notes from prior ED visits and [place] Controlled Substance Database   ____________________________________________   FINAL CLINICAL IMPRESSION(S) / ED DIAGNOSES  Final diagnoses:  Substance abuse (HCC)      NEW MEDICATIONS STARTED DURING THIS VISIT:  New Prescriptions   No medications on file     Note:  This document was prepared using Dragon voice recognition software and may include unintentional dictation errors.    Willy Eddy, MD 07/22/20 [phone]

## 2020-07-24 ENCOUNTER — Ambulatory Visit: Payer: Self-pay | Admitting: Gerontology

## 2020-07-25 ENCOUNTER — Encounter: Payer: Self-pay | Admitting: Emergency Medicine

## 2020-07-25 ENCOUNTER — Other Ambulatory Visit: Payer: Self-pay

## 2020-07-25 DIAGNOSIS — F209 Schizophrenia, unspecified: Secondary | ICD-10-CM | POA: Insufficient documentation

## 2020-07-25 DIAGNOSIS — F319 Bipolar disorder, unspecified: Secondary | ICD-10-CM | POA: Insufficient documentation

## 2020-07-25 DIAGNOSIS — F1590 Other stimulant use, unspecified, uncomplicated: Secondary | ICD-10-CM | POA: Insufficient documentation

## 2020-07-25 DIAGNOSIS — F17210 Nicotine dependence, cigarettes, uncomplicated: Secondary | ICD-10-CM | POA: Insufficient documentation

## 2020-07-25 LAB — COMPREHENSIVE METABOLIC PANEL
ALT: 78 U/L — ABNORMAL HIGH (ref 0–44)
AST: 57 U/L — ABNORMAL HIGH (ref 15–41)
Albumin: 3.9 g/dL (ref 3.5–5.0)
Alkaline Phosphatase: 61 U/L (ref 38–126)
Anion gap: 10 (ref 5–15)
BUN: 17 mg/dL (ref 6–20)
CO2: 26 mmol/L (ref 22–32)
Calcium: 9.5 mg/dL (ref 8.9–10.3)
Chloride: 104 mmol/L (ref 98–111)
Creatinine, Ser: 0.76 mg/dL (ref 0.61–1.24)
GFR calc Af Amer: >60 mL/min (ref >60)
GFR calc non Af Amer: >60 mL/min (ref >60)
Glucose, Bld: 119 mg/dL — ABNORMAL HIGH (ref 70–99)
Potassium: 4.4 mmol/L (ref 3.5–5.1)
Sodium: 140 mmol/L (ref 135–145)
Total Bilirubin: 0.6 mg/dL (ref 0.3–1.2)
Total Protein: 7.1 g/dL (ref 6.5–8.1)

## 2020-07-25 LAB — CBC
HCT: 40.1 % (ref 39.0–52.0)
Hemoglobin: 13.4 g/dL (ref 13.0–17.0)
MCH: 32.4 pg (ref 26.0–34.0)
MCHC: 33.4 g/dL (ref 30.0–36.0)
MCV: 96.9 fL (ref 80.0–100.0)
Platelets: 260 10*3/uL (ref 150–400)
RBC: 4.14 MIL/uL — ABNORMAL LOW (ref 4.22–5.81)
RDW: 14.3 % (ref 11.5–15.5)
WBC: 7.9 10*3/uL (ref 4.0–10.5)
nRBC: 0 % (ref 0.0–0.2)

## 2020-07-25 LAB — URINE DRUG SCREEN, QUALITATIVE (ARMC ONLY)
Amphetamines, Ur Screen: NOT DETECTED
Barbiturates, Ur Screen: NOT DETECTED
Benzodiazepine, Ur Scrn: NOT DETECTED
Cannabinoid 50 Ng, Ur ~~LOC~~: NOT DETECTED
Cocaine Metabolite,Ur ~~LOC~~: NOT DETECTED
MDMA (Ecstasy)Ur Screen: NOT DETECTED
Methadone Scn, Ur: NOT DETECTED
Opiate, Ur Screen: NOT DETECTED
Phencyclidine (PCP) Ur S: NOT DETECTED
Tricyclic, Ur Screen: NOT DETECTED

## 2020-07-25 LAB — ACETAMINOPHEN LEVEL: Acetaminophen (Tylenol), Serum: <10 ug/mL — ABNORMAL LOW (ref 10–30)

## 2020-07-25 LAB — SALICYLATE LEVEL: Salicylate Lvl: <7 mg/dL — ABNORMAL LOW (ref 7.0–30.0)

## 2020-07-25 LAB — ETHANOL: Alcohol, Ethyl (B): <10 mg/dL (ref <10)

## 2020-07-25 NOTE — ED Notes (Signed)
Pt. 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

## 2020-07-25 NOTE — ED Triage Notes (Signed)
Pt presents to ER from [facility], brought by [employer], pt reports he was in town and started to feel anxious and hearing voices, pt reports voices are commanding him to kill him, pt crying in triage room denies any HI, or visual hallucinations. Pt calm at present, pt reports he is schizophrenic and has not taken medication in years.

## 2020-07-26 ENCOUNTER — Emergency Department
Admission: EM | Admit: 2020-07-26 | Discharge: 2020-07-26 | Disposition: A | Discharge status: Home / Self Care | Payer: Self-pay | Attending: Emergency Medicine | Admitting: Emergency Medicine

## 2020-07-26 DIAGNOSIS — F17200 Nicotine dependence, unspecified, uncomplicated: Secondary | ICD-10-CM | POA: Diagnosis present

## 2020-07-26 DIAGNOSIS — R45851 Suicidal ideations: Secondary | ICD-10-CM

## 2020-07-26 DIAGNOSIS — F19980 Other psychoactive substance use, unspecified with psychoactive substance-induced anxiety disorder: Secondary | ICD-10-CM | POA: Diagnosis present

## 2020-07-26 DIAGNOSIS — F3132 Bipolar disorder, current episode depressed, moderate: Secondary | ICD-10-CM

## 2020-07-26 DIAGNOSIS — F3163 Bipolar disorder, current episode mixed, severe, without psychotic features: Secondary | ICD-10-CM | POA: Diagnosis present

## 2020-07-26 DIAGNOSIS — F259 Schizoaffective disorder, unspecified: Secondary | ICD-10-CM | POA: Insufficient documentation

## 2020-07-26 DIAGNOSIS — F209 Schizophrenia, unspecified: Secondary | ICD-10-CM

## 2020-07-26 DIAGNOSIS — F319 Bipolar disorder, unspecified: Secondary | ICD-10-CM | POA: Diagnosis present

## 2020-07-26 NOTE — BH Assessment (Addendum)
Assessment Note  Nathan [name] is an 34 y.o. male. Pt was sleeping in waiting room upon this writer's arrival. Patient had a depressed mood with a flat/ sullen affect. The patient was in scrubs and had a disheveled appearance. Patient states that he currently lives with family and that they are his support system. The patient stated that he has a hx of alcohol abuse and admitted to using Fentanyl on Sunday. The patient expressed his desire to get into [facility] on Monday for substance abuse treatment. The patient reported auditory hallucinations that tell him that he'll never be better than the drugs and to use. The patient explained that he has multiple suicide attempts and wanted to come in as he has worsening SI. The patient admitted to worsening feelings of depression. The patient reported that he used to go to [facility], however he is not connected to any outpatient services and does not take any medications. Patient reported that he has increased sleep and a good appetite. Currently denies any homicidal ideations, or paranoia. Patient stated, "I'm real depressed and in my feelings."   Diagnosis: 296.54 Bipolar I disorder, Current or most recent episode depressed, With psychotic features  Past Medical History:  Past Medical History:  Diagnosis Date  . Bipolar 1 disorder ([facility])   . Chronic headache   . Depression 03/30/2020  . ETOH abuse 03/30/2020  . 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 Horseshoe kidney   . Marijuana smoker 03/30/2020    Past Surgical History:  Procedure Laterality Date  . TONSILLECTOMY      Family History:  Family History  Problem Relation Age of Onset  . Hypertension Mother   . Diabetes Mother   . Hypertension Father   . Heart failure Father   . Diabetes Father     Social History:  reports that he has been smoking cigarettes. He has been smoking about 1.50 packs per day. His smokeless tobacco use includes chew. He reports previous alcohol use of about 6.0 standard  drinks of alcohol per week. He reports previous drug use. Drug: IV.  Additional Social History:  Alcohol / Drug Use Pain Medications: See PTA Prescriptions: See PTA History of alcohol / drug use?: Yes Negative Consequences of Use: Personal relationships Substance #1 Name of Substance 1: Alcohol Substance #2 Name of Substance 2: Fentanyl  CIWA: CIWA-Ar BP: (!) 172/87 Pulse Rate: 97 COWS:    Allergies:  Allergies  Allergen Reactions  . Geodon [Ziprasidone Hcl] Other (See Comments)    Seizures  . Valproic Acid   . Levofloxacin Itching    Home Medications: (Not in a hospital admission)   OB/GYN Status:  No LMP for male patient.  General Assessment Data Location of Assessment: [facility] ED TTS Assessment: In system Is this a Tele or Face-to-Face Assessment?: Face-to-Face Is this an Initial Assessment or a Re-assessment for this encounter?: Initial Assessment Patient Accompanied by:: N/A Language Other than English: No Living Arrangements: Other (Comment) What gender do you identify as?: Male Date Telepsych consult ordered in [facility]: 07/26/20 Marital status: Single Maiden name: n/a Pregnancy Status: No Living Arrangements: Parent Can pt return to current living arrangement?: Yes Admission Status: Voluntary Is patient capable of signing voluntary admission?: Yes Referral Source: Self/Family/Friend Insurance type: None  Medical Screening Exam [facility] Walk-in ONLY) Medical Exam completed: Yes  Crisis Care Plan Living Arrangements: Parent Legal Guardian: Other: (Self) Name of Psychiatrist: None Noted Name of Therapist: None Noted  Education Status Is patient currently in school?:  No Is the patient employed, unemployed or receiving disability?: Unemployed  Risk to self with the past 6 months Suicidal Ideation: Yes-Currently Present Has patient been a risk to self within the past 6 months prior to admission? : Yes Suicidal Intent: No Has patient had any suicidal intent  within the past 6 months prior to admission? : Yes Is patient at risk for suicide?: Yes Suicidal Plan?: No Has patient had any suicidal plan within the past 6 months prior to admission? : No Access to Means: No What has been your use of drugs/alcohol within the last 12 months?: Alcohol/Methamphetamine Previous Attempts/Gestures: Yes How many times?: 3 Other Self Harm Risks: Worsening Depression Triggers for Past Attempts: Unpredictable Intentional Self Injurious Behavior: Cutting Comment - Self Injurious Behavior: Overdose attempts, hanging attempts Family Suicide History: Unknown Recent stressful life event(s): Conflict (Comment) Persecutory voices/beliefs?: Yes Depression: Yes Depression Symptoms: Feeling worthless/self pity Substance abuse history and/or treatment for substance abuse?: Yes Suicide prevention information given to non-admitted patients: Not applicable  Risk to Others within the past 6 months Homicidal Ideation: No Does patient have any lifetime risk of violence toward others beyond the six months prior to admission? : No Thoughts of Harm to Others: No Current Homicidal Intent: No Current Homicidal Plan: No Access to Homicidal Means: No Identified Victim: n/a History of harm to others?: No Assessment of Violence: None Noted Violent Behavior Description: n/a Does patient have access to weapons?: No Criminal Charges Pending?: No Does patient have a court date: No Is patient on probation?: No  Psychosis Hallucinations: Auditory Delusions: None noted  Mental Status Report Appearance/Hygiene: Disheveled Eye Contact: Poor Motor Activity: Freedom of movement Speech: Unremarkable Level of Consciousness: Drowsy Mood: Despair, Worthless, low self-esteem, Depressed Affect: Depressed, Sullen Anxiety Level: Minimal Thought Processes: Coherent, Relevant Judgement: Partial Orientation: Person, Time, Place, Situation Obsessive Compulsive Thoughts/Behaviors:  None  Cognitive Functioning Concentration: Normal Memory: Recent Intact, Remote Intact Is patient IDD: No Insight: Good Impulse Control: Fair Appetite: Good Have you had any weight changes? : No Change Sleep: No Change Vegetative Symptoms: None  ADLScreening [facility] Assessment Services) Patient's cognitive ability adequate to safely complete daily activities?: Yes Patient able to express need for assistance with ADLs?: Yes Independently performs ADLs?: Yes (appropriate for developmental age)  Prior Inpatient Therapy Prior Inpatient Therapy: No  Prior Outpatient Therapy Prior Outpatient Therapy: Yes Prior Therapy Dates:  (Pt unable to recall) Prior Therapy Facilty/Provider(s): [facility] Reason for Treatment: Depression Does patient have an ACCT team?: No Does patient have Intensive In-House Services?  : No Does patient have [facility] services? : No Does patient have P4CC services?: No  ADL Screening (condition at time of admission) Patient's cognitive ability adequate to safely complete daily activities?: Yes Is the patient deaf or have difficulty hearing?: No Does the patient have difficulty seeing, even when wearing glasses/contacts?: No Does the patient have difficulty concentrating, remembering, or making decisions?: No Patient able to express need for assistance with ADLs?: Yes Does the patient have difficulty dressing or bathing?: No Independently performs ADLs?: Yes (appropriate for developmental age) Does the patient have difficulty walking or climbing stairs?: No Weakness of Legs: None Weakness of Arms/Hands: None  Home Assistive Devices/Equipment Home Assistive Devices/Equipment: None  Therapy Consults (therapy consults require a physician order) PT Evaluation Needed: No OT Evalulation Needed: No SLP Evaluation Needed: No   Values / Beliefs Cultural Requests During Hospitalization: None Spiritual Requests During Hospitalization: None Consults Spiritual Care  Consult Needed: No Transition of Care Team Consult Needed:  No Advance Directives (For Healthcare) Does Patient Have a Medical Advance Directive?: No Would patient like information on creating a medical advance directive?: No - Patient declined          Disposition: Per Annice Pih, NP patient to be observed overnight and reassessed in that A.M.  Disposition Initial Assessment Completed for this Encounter: Yes  On Site Evaluation by:   Reviewed with Physician:    Foy Guadalajara 07/26/2020 3:15 AM

## 2020-07-26 NOTE — ED Notes (Signed)
Pt does not want to wait for paperwork or vitals. Pt signs for d/c and leaves.

## 2020-07-26 NOTE — ED Notes (Signed)
Hourly rounding reveals patient sleeping in hall bed. No complaints, stable, in no acute distress. Q15 minute rounds and monitoring via Rover and Officer to continue.  

## 2020-07-26 NOTE — ED Notes (Signed)
Pt seen by TTS and wanting to leave. Pt given clothes and other belongings.

## 2020-07-26 NOTE — Consult Note (Signed)
[facility] Face-to-Face Psychiatry Consult   Reason for Consult: Psychiatric evaluation Referring Physician: Dr. Dolores Frame Patient Identification: Dajour Pierpoint MRN:  [id number] Principal Diagnosis: <principal problem not specified> Diagnosis:  Active Problems:   Substance or medication-induced anxiety disorder (HCC)   Bipolar 1 disorder, mixed, severe (HCC)   Suicidal ideation   Tobacco use disorder   Bipolar 1 disorder, depressed (HCC)   Total Time spent with patient: 30 minutes  Subjective: "I am supposed to go into rehab on Monday for 2 years and that did not want to use any drugs." [facility] Gehres is a 34 y.o. male patient presented to [facility] ED from [place] by law enforcement voluntarily. Per the ED triage nurse note, the patient reports he was in town and started to feel anxious and hearing voices.  The patient says voices are commanding him to kill himself; the patient is crying in the triage room denies any HI or visual hallucinations.  The patient reports he has schizophrenia and has not taken medication in years.   The patient has been seen in this ER on many occasions due to substance use. The patient admits to last using fentanyl on Sunday 08.01.21.  The patient disclosed that he is supposed to go into rehab on Monday 08.09.21, and he is trying not to use any drugs before being admitted into rehab. The patient was seen face-to-face by this provider; the chart was reviewed and consulted with Dr. Dolores Frame on 07/26/2020 due to the patient's care. It was discussed with the EDP that the patient should remain under observation overnight and will be reassessed in the a.m. to determine if he meets the criteria for psychiatric inpatient admission; he could be discharged back home.  On evaluation, the patient is alert and oriented x 3, calm, sleeping but cooperative, and mood-congruent with affect. The patient does not appear to be responding to internal or external stimuli. Neither is the patient  presenting with any delusional thinking. The patient denies auditory hallucinations but denies visual hallucinations. The patient admits to suicidal ideation without a plan but denies homicidal or self-harm ideations. The patient is not presenting with any psychotic or paranoid behaviors. During an encounter with the patient, he was able to answer questions appropriately.  HPI:   Past Psychiatric History:  Bipolar 1 disorder (HCC) Depression Alcohol abuse Marijuana smoker  Risk to Self: Suicidal Ideation: Yes-Currently Present Suicidal Intent: No Is patient at risk for suicide?: Yes Suicidal Plan?: No Access to Means: No What has been your use of drugs/alcohol within the last 12 months?: Alcohol/Methamphetamine How many times?: 3 Other Self Harm Risks: Worsening Depression Triggers for Past Attempts: Unpredictable Intentional Self Injurious Behavior: Cutting Comment - Self Injurious Behavior: Overdose attempts, hanging attempts Risk to Others: Homicidal Ideation: No Thoughts of Harm to Others: No Current Homicidal Intent: No Current Homicidal Plan: No Access to Homicidal Means: No Identified Victim: n/a History of harm to others?: No Assessment of Violence: None Noted Violent Behavior Description: n/a Does patient have access to weapons?: No Criminal Charges Pending?: No Does patient have a court date: No No Prior Inpatient Therapy: Prior Inpatient Therapy: NoYes Prior Outpatient Therapy: Prior Outpatient Therapy: Yes Prior Therapy Dates:  (Pt unable to recall) Prior Therapy Facilty/Provider(s): [facility] Reason for Treatment: Depression Does patient have an ACCT team?: No Does patient have Intensive In-House Services?  : No Does patient have [facility] services? : No Does patient have P4CC services?: NoYes  Past Medical History:  Past Medical History:  Diagnosis Date  .  Bipolar 1 disorder (HCC)   . Chronic headache   . Depression 03/30/2020  . ETOH abuse 03/30/2020  . Hep C  w/o coma, chronic (HCC)   . Horseshoe kidney   . Marijuana smoker 03/30/2020    Past Surgical History:  Procedure Laterality Date  . TONSILLECTOMY     Family History:  Family History  Problem Relation Age of Onset  . Hypertension Mother   . Diabetes Mother   . Hypertension Father   . Heart failure Father   . Diabetes Father    Family Psychiatric  History: Maternal and paternal-substance use disorder, bipolar and depression Social History:  Social History   Substance and Sexual Activity  Alcohol Use Not Currently  . Alcohol/week: 6.0 standard drinks  . Types: 6 Cans of beer per week     Social History   Substance and Sexual Activity  Drug Use Not Currently  . Types: IV   Comment: heroine    Social History   Socioeconomic History  . Marital status: Single    Spouse name: Not on file  . Number of children: Not on file  . Years of education: Not on file  . Highest education level: Not on file  Occupational History  . Not on file  Tobacco Use  . Smoking status: Current Every Day Smoker    Packs/day: 1.50    Types: Cigarettes  . Smokeless tobacco: Current User    Types: Chew  Vaping Use  . Vaping Use: Never used  Substance and Sexual Activity  . Alcohol use: Not Currently    Alcohol/week: 6.0 standard drinks    Types: 6 Cans of beer per week  . Drug use: Not Currently    Types: IV    Comment: heroine  . Sexual activity: Yes  Other Topics Concern  . Not on file  Social History Narrative  . Not on file   Social Determinants of Health   Financial Resource Strain:   . Difficulty of Paying Living Expenses:   Food Insecurity:   . Worried About Programme researcher, broadcasting/film/video in the Last Year:   . Barista in the Last Year:   Transportation Needs:   . Freight forwarder (Medical):   Marland Kitchen Lack of Transportation (Non-Medical):   Physical Activity:   . Days of Exercise per Week:   . Minutes of Exercise per Session:   Stress:   . Feeling of Stress :   Social  Connections:   . Frequency of Communication with Friends and Family:   . Frequency of Social Gatherings with Friends and Family:   . Attends Religious Services:   . Active Member of Clubs or Organizations:   . Attends Banker Meetings:   Marland Kitchen Marital Status:    Additional Social History:    Allergies:   Allergies  Allergen Reactions  . Geodon [Ziprasidone Hcl] Other (See Comments)    Seizures  . Valproic Acid   . Levofloxacin Itching    Labs:  Results for orders placed or performed during the hospital encounter of 07/25/20 (from the past 48 hour(s))  Comprehensive metabolic panel     Status: Abnormal   Collection Time: 07/25/20  9:13 PM  Result Value Ref Range   Sodium 140 135 - 145 mmol/L   Potassium 4.4 3.5 - 5.1 mmol/L   Chloride 104 98 - 111 mmol/L   CO2 26 22 - 32 mmol/L   Glucose, Bld 119 (H) 70 - 99 mg/dL  Comment: Glucose reference range applies only to samples taken after fasting for at least 8 hours.   BUN 17 6 - 20 mg/dL   Creatinine, Ser 4.40 0.61 - 1.24 mg/dL   Calcium 9.5 8.9 - 10.2 mg/dL   Total Protein 7.1 6.5 - 8.1 g/dL   Albumin 3.9 3.5 - 5.0 g/dL   AST 57 (H) 15 - 41 U/L   ALT 78 (H) 0 - 44 U/L   Alkaline Phosphatase 61 38 - 126 U/L   Total Bilirubin 0.6 0.3 - 1.2 mg/dL   GFR calc non Af Amer >60 >60 mL/min   GFR calc Af Amer >60 >60 mL/min   Anion gap 10 5 - 15    Comment: Performed at [facility], [address] [place], [place] [address]  Ethanol     Status: None   Collection Time: 07/25/20  9:13 PM  Result Value Ref Range   Alcohol, Ethyl (B) <10 <10 mg/dL    Comment: (NOTE) Lowest detectable limit for serum alcohol is 10 mg/dL.  For medical purposes only. Performed at [facility], [address] [place], [place] [address]   Salicylate level     Status: Abnormal   Collection Time: 07/25/20  9:13 PM  Result Value Ref Range   Salicylate Lvl <7.0 (L) 7.0 - 30.0 mg/dL    Comment: Performed at [facility], [address] [place], [place] [address]  Acetaminophen level     Status: Abnormal   Collection Time: 07/25/20  9:13 PM  Result Value Ref Range   Acetaminophen (Tylenol), Serum <10 (L) 10 - 30 ug/mL    Comment: (NOTE) Therapeutic concentrations vary significantly. A range of 10-30 ug/mL  may be an effective concentration for many patients. However, some  are best treated at concentrations outside of this range. Acetaminophen concentrations >150 ug/mL at 4 hours after ingestion  and >50 ug/mL at 12 hours after ingestion are often associated with  toxic reactions.  Performed at [facility], [address] [place], [place] [address]   cbc     Status: Abnormal   Collection Time: 07/25/20  9:13 PM  Result Value Ref Range   WBC 7.9 4.0 - 10.5 K/uL   RBC 4.14 (L) 4.22 - 5.81 MIL/uL   Hemoglobin 13.4 13.0 - 17.0 g/dL   HCT 75.6 39 - 52 %   MCV 96.9 80.0 - 100.0 fL   MCH 32.4 26.0 - 34.0 pg   MCHC 33.4 30.0 - 36.0 g/dL   RDW 43.3 29.5 - 18.8 %   Platelets 260 150 - 400 K/uL   nRBC 0.0 0.0 - 0.2 %    Comment: Performed at [facility], [address], [place], [place] [address]  Urine Drug Screen, Qualitative     Status: None   Collection Time: 07/25/20  9:14 PM  Result Value Ref Range   Tricyclic, Ur Screen NONE DETECTED NONE DETECTED   Amphetamines, Ur Screen NONE DETECTED NONE DETECTED   MDMA (Ecstasy)Ur Screen NONE DETECTED NONE DETECTED   Cocaine Metabolite,Ur [place] NONE DETECTED NONE DETECTED   Opiate, Ur Screen NONE DETECTED NONE DETECTED   Phencyclidine (PCP) Ur S NONE DETECTED NONE DETECTED   Cannabinoid 50 Ng, Ur [place] NONE DETECTED NONE DETECTED   Barbiturates, Ur Screen NONE DETECTED NONE DETECTED   Benzodiazepine, Ur Scrn NONE DETECTED NONE DETECTED   Methadone Scn, Ur NONE DETECTED NONE DETECTED    Comment: (NOTE) Tricyclics + metabolites, urine  Cutoff 1000 ng/mL Amphetamines + metabolites, urine  Cutoff 1000 ng/mL MDMA (Ecstasy),  urine              Cutoff 500 ng/mL Cocaine Metabolite, urine          Cutoff 300 ng/mL Opiate + metabolites, urine        Cutoff 300 ng/mL Phencyclidine (PCP), urine         Cutoff 25 ng/mL Cannabinoid, urine                 Cutoff 50 ng/mL Barbiturates + metabolites, urine  Cutoff 200 ng/mL Benzodiazepine, urine              Cutoff 200 ng/mL Methadone, urine                   Cutoff 300 ng/mL  The urine drug screen provides only a preliminary, unconfirmed analytical test result and should not be used for non-medical purposes. Clinical consideration and professional judgment should be applied to any positive drug screen result due to possible interfering substances. A more specific alternate chemical method must be used in order to obtain a confirmed analytical result. Gas chromatography / mass spectrometry (GC/MS) is the preferred confirm atory method. Performed at [facility], [address] [place], [place] [address]     No current facility-administered medications for this encounter.   Current Outpatient Medications  Medication Sig Dispense Refill  . ibuprofen (ADVIL) 800 MG tablet Take 1 tablet (800 mg total) by mouth daily. 30 tablet 0  . omeprazole (PRILOSEC) 20 MG capsule Take 1 capsule (20 mg total) by mouth daily. 30 capsule 2    Musculoskeletal: Strength & Muscle Tone: within normal limits Gait & Station: normal Patient leans: N/A  Psychiatric Specialty Exam: Physical Exam Psychiatric:        Attention and Perception: Attention and perception normal.        Mood and Affect: Mood and affect normal.        Speech: Speech normal.        Behavior: Behavior normal. Behavior is cooperative.        Thought Content: Thought content includes suicidal ideation.        Cognition and Memory: Cognition and memory normal.        Judgment: Judgment normal.     Review of Systems  Psychiatric/Behavioral: Positive for hallucinations and suicidal ideas. The  patient is nervous/anxious.   All other systems reviewed and are negative.   Blood pressure (!) 172/87, pulse 97, temperature 98.2 F (36.8 C), temperature source Oral, resp. rate 20, height 5\' 9"  (1.753 m), weight 62.6 kg, SpO2 97 %.Body mass index is 20.38 kg/m.  General Appearance: Bizarre and Disheveled  Eye Contact:  Good  Speech:  Clear and Coherent  Volume:  Normal  Mood:  Anxious and Depressed  Affect:  Congruent  Thought Process:  Coherent  Orientation:  Full (Time, Place, and Person)  Thought Content:  Logical and Obsessions  Suicidal Thoughts:  Yes.  without intent/plan  Homicidal Thoughts:  No  Memory:  Immediate;   Good Recent;   Good Remote;   Good  Judgement:  Fair  Insight:  Fair  Psychomotor Activity:  Normal  Concentration:  Concentration: Good and Attention Span: Good  Recall:  Good  Fund of Knowledge:  Good  Language:  Good  Akathisia:  Negative  Handed:  Right  AIMS (if indicated):     Assets:  Communication Skills Desire for Improvement  Resilience Social Support  ADL's:  Intact  Cognition:  WNL  Sleep:    Okay at home     Treatment Plan Summary: Daily contact with patient to assess and evaluate symptoms and progress in treatment, Medication management and Plan The patient remained under observation overnight and will be reassessed in the a.m. to determine if he meets the criteria for psychiatric inpatient admission; he could be discharged back home.  Disposition: Supportive therapy provided about ongoing stressors. The patient remained under observation overnight and will be reassessed in the a.m. to determine if he meets the criteria for psychiatric inpatient admission; he could be discharged back home.  Gillermo MurdochJacqueline Markees Carns, NP 07/26/2020 3:38 AM

## 2020-07-26 NOTE — BH Assessment (Signed)
Writer spoke with the patient to complete an updated/reassessment. Patient denies SI/HI and AV/H.  Patient states he was feeling overwhelmed last night, prior to coming to the ER. He was at [facility] with love ones and some witnessed him hitting his head against the wall and called 911. Patient further reports, he was afraid he was going to relapse and start back using drugs, which was the cause of his depressed mood. He reports he is proud of himself for not relapsing and doing anything to harm himself, because has made poor decisions in the past.  He states he is currently doing well and realizes he just needed to "ride the feelings out." Patient currently denying SI/HI and AV/H. He also reports of been accepted to [facility], long-term treatment facility, and he's excited about it. At this time, patient is requesting to be discharged.

## 2020-07-26 NOTE — ED Provider Notes (Signed)
[facility] Emergency Department Provider Note   ____________________________________________   First MD Initiated Contact with Patient 07/26/20 0250     (approximate)  I have reviewed the triage vital signs and the nursing notes.   HISTORY  Chief Complaint Suicidal and Psychiatric Evaluation    HPI Nathan [name] is a 34 y.o. male brought to the ED by mother and please voluntarily for anxiety, hearing voices to kill himself.  Patient states he is a schizophrenic has not taken his medications.  Also states he is to enter a drug treatment program next week.  Currently denies active SI/HI.  Voices no medical complaints.     Past Medical History:  Diagnosis Date  . Bipolar 1 disorder (HCC)   . Chronic headache   . Depression 03/30/2020  . ETOH abuse 03/30/2020  . 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 Horseshoe kidney   . Marijuana smoker 03/30/2020    Patient Active Problem List   Diagnosis Date Noted  . Schizoaffective disorder (HCC) 07/26/2020  . Chronic headache 06/26/2020  . Encounter to establish care 06/26/2020  . History of gastroesophageal reflux (GERD) 06/26/2020  . Non-suicidal self harm 04/05/2020  . Bipolar 1 disorder, depressed (HCC) 01/31/2018  . Bipolar 1 disorder, mixed, severe (HCC) 04/22/2016  . Alcohol abuse 04/22/2016  . Suicidal ideation 04/22/2016  . Tobacco use disorder 04/22/2016  . 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 TONSILLECTOMY      Prior to Admission medications   Medication Sig Start Date End Date Taking? Authorizing Provider  ibuprofen (ADVIL) 800 MG tablet Take 1 tablet (800 mg total) by mouth daily. 06/26/20   Iloabachie, Chioma E, NP  omeprazole (PRILOSEC) 20 MG capsule Take 1 capsule (20 mg total) by mouth daily. 06/26/20   Iloabachie, Chioma E, NP    Allergies Geodon [ziprasidone hcl], Valproic acid, and  Levofloxacin  Family History  Problem Relation Age of Onset  . Hypertension Mother   . Diabetes Mother   . Hypertension Father   . Heart failure Father   . Diabetes Father     Social History Social History   Tobacco Use  . Smoking status: Current Every Day Smoker    Packs/day: 1.50    Types: Cigarettes  . Smokeless tobacco: Current User    Types: Chew  Vaping Use  . Vaping Use: Never used  Substance Use Topics  . Alcohol use: Not Currently    Alcohol/week: 6.0 standard drinks    Types: 6 Cans of beer per week  . Drug use: Not Currently    Types: IV    Comment: heroine    Review of Systems  Constitutional: No fever/chills Eyes: No visual changes. ENT: No sore throat. Cardiovascular: Denies chest pain. Respiratory: Denies shortness of breath. Gastrointestinal: No abdominal pain.  No nausea, no vomiting.  No diarrhea.  No constipation. Genitourinary: Negative for dysuria. Musculoskeletal: Negative for back pain. Skin: Negative for rash. Neurological: Negative for headaches, focal weakness or numbness. Psychiatric:  Positive for hearing voices.  ____________________________________________   PHYSICAL EXAM:  VITAL SIGNS: ED Triage Vitals  Enc Vitals Group     BP 07/25/20 2106 (!) 172/87     Pulse Rate 07/25/20 2106 97     Resp 07/25/20 2106 20     Temp 07/25/20 2106 98.2 F (36.8 C)     Temp Source 07/25/20 2106 Oral     SpO2  07/25/20 2106 97 %     Weight 07/25/20 2107 138 lb (62.6 kg)     Height 07/25/20 2107 5\' 9"  (1.753 m)     Head Circumference --      Peak Flow --      Pain Score 07/25/20 2106 7     Pain Loc --      Pain Edu? --      Excl. in GC? --     Constitutional: Alert and oriented.  Disheveled appearing and in no acute distress. Eyes: Conjunctivae are normal. PERRL. EOMI. Head: Atraumatic. Nose: No congestion/rhinnorhea. Mouth/Throat: Mucous membranes are moist.  Oropharynx non-erythematous. Neck: No stridor.   Cardiovascular: Normal  rate, regular rhythm. Grossly normal heart sounds.  Good peripheral circulation. Respiratory: Normal respiratory effort.  No retractions. Lungs CTAB. Gastrointestinal: Soft and nontender. No distention. No abdominal bruits. No CVA tenderness. Musculoskeletal: No lower extremity tenderness nor edema.  No joint effusions. Neurologic:  Normal speech and language. No gross focal neurologic deficits are appreciated. No gait instability. Skin:  Skin is warm, dry and intact. No rash noted. Psychiatric: Mood and affect are flat. Speech and behavior are normal.  ____________________________________________   LABS (all labs ordered are listed, but only abnormal results are displayed)  Labs Reviewed  COMPREHENSIVE METABOLIC PANEL - Abnormal; Notable for the following components:      Result Value   Glucose, Bld 119 (*)    AST 57 (*)    ALT 78 (*)    All other components within normal limits  SALICYLATE LEVEL - Abnormal; Notable for the following components:   Salicylate Lvl <7.0 (*)    All other components within normal limits  ACETAMINOPHEN LEVEL - Abnormal; Notable for the following components:   Acetaminophen (Tylenol), Serum <10 (*)    All other components within normal limits  CBC - Abnormal; Notable for the following components:   RBC 4.14 (*)    All other components within normal limits  ETHANOL  URINE DRUG SCREEN, QUALITATIVE ([facility] ONLY)   ____________________________________________  EKG  None ____________________________________________  RADIOLOGY  ED MD interpretation: None  Official radiology report(s): No results found.  ____________________________________________   PROCEDURES  Procedure(s) performed (including Critical Care):  Procedures   ____________________________________________   INITIAL IMPRESSION / ASSESSMENT AND PLAN / ED COURSE  As part of my medical decision making, I reviewed the following data within the electronic MEDICAL RECORD NUMBER Nursing  notes reviewed and incorporated, Labs reviewed, Old chart reviewed, A consult was requested and obtained from this/these consultant(s) Psychiatry and Notes from prior ED visits     [facility] Nathan [name] was evaluated in Emergency Department on 07/26/2020 for the symptoms described in the history of present illness. He was evaluated in the context of the global COVID-19 pandemic, which necessitated consideration that the patient might be at risk for infection with the SARS-CoV-2 virus that causes COVID-19. Institutional protocols and algorithms that pertain to the evaluation of patients at risk for COVID-19 are in a state of rapid change based on information released by regulatory bodies including the CDC and federal and state organizations. These policies and algorithms were followed during the patient's care in the ED.    34 year old male with schizophrenia and bipolar disorder who presents voluntarily for anxiety and hearing voices.  Contracts for safety while in the emergency department. The patient has been placed in psychiatric observation due to the need to provide a safe environment for the patient while obtaining psychiatric consultation and evaluation, as well  as ongoing medical and medication management to treat the patient's condition.  The patient has not been placed under full IVC at this time.   Clinical Course as of Jul 26 708  Sat Jul 26, 2020  0251 Patient evaluated by psychiatric NP who recommends reassessment in the morning. The patient has been placed in psychiatric observation due to the need to provide a safe environment for the patient while obtaining psychiatric consultation and evaluation, as well as ongoing medical and medication management to treat the patient's condition. The patient has not been placed under full IVC at this time.     [JS]    Clinical Course User Index [JS] Irean Hong, MD     ____________________________________________   FINAL CLINICAL  IMPRESSION(S) / ED DIAGNOSES  Final diagnoses:  Schizophrenia, unspecified type (HCC)  Bipolar affective disorder, currently depressed, moderate [facility])     ED Discharge Orders    None       Note:  This document was prepared using Dragon voice recognition software and may include unintentional dictation errors.   Irean Hong, MD 07/26/20 [phone]

## 2020-07-26 NOTE — ED Notes (Signed)
Pt. Transferred from Triage to 24 hall after dressing out and screening for contraband. Pt. Oriented to Quad including Q15 minute rounds as well as Psychologist, counselling for their protection. Patient is alert and oriented, warm and dry in no acute distress. Patient denies SI, HI, and VH but states he hears voices without command. Pt. Encouraged to let me know if needs arise.

## 2020-07-27 ENCOUNTER — Emergency Department: Payer: Self-pay

## 2020-07-27 ENCOUNTER — Emergency Department
Admission: EM | Admit: 2020-07-27 | Discharge: 2020-07-27 | Disposition: A | Discharge status: Home / Self Care | Payer: Self-pay | Attending: Emergency Medicine | Admitting: Emergency Medicine

## 2020-07-27 ENCOUNTER — Other Ambulatory Visit: Payer: Self-pay

## 2020-07-27 DIAGNOSIS — F17210 Nicotine dependence, cigarettes, uncomplicated: Secondary | ICD-10-CM | POA: Insufficient documentation

## 2020-07-27 DIAGNOSIS — M25551 Pain in right hip: Secondary | ICD-10-CM | POA: Insufficient documentation

## 2020-07-27 DIAGNOSIS — M25559 Pain in unspecified hip: Secondary | ICD-10-CM

## 2020-07-27 NOTE — ED Triage Notes (Signed)
Patient reports symptoms started 3 days ago when he jumped off a wall and hurt his left ankle.  Now reports pain goes up into his pelvis.

## 2020-07-27 NOTE — ED Provider Notes (Signed)
[facility] Emergency Department Provider Note  ____________________________________________  Time seen: Approximately 8:15 PM  I have reviewed the triage vital signs and the nursing notes.   HISTORY  Chief Complaint Ankle Pain and Leg Pain    HPI Page Lancon Balfour is a 34 y.o. male that presents to the emergency department for evaluation of right hip pain after an injury 5 days ago.  Patient states that he jumped off of a wall and injured his right ankle.  Ankle was x-rayed and negative for fracture.  He was given a brace for his ankle but has not been wearing it.  Now he has pain to his right hip.  He was evaluated here last night for anxiety and hearing voices.  Patient denies any suicidal or homicidal ideations.  He called EMS from [facility] for his hip pain because he did not feel like he could walk home from [place] due to the pain.  He is asking how he will get home from the hospital.  Past Medical History:  Diagnosis Date  . Bipolar 1 disorder (HCC)   . Chronic headache   . Depression 03/30/2020  . ETOH abuse 03/30/2020  . 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 Horseshoe kidney   . Marijuana smoker 03/30/2020    Patient Active Problem List   Diagnosis Date Noted  . Schizoaffective disorder (HCC) 07/26/2020  . Chronic headache 06/26/2020  . Encounter to establish care 06/26/2020  . History of gastroesophageal reflux (GERD) 06/26/2020  . Non-suicidal self harm 04/05/2020  . Bipolar 1 disorder, depressed (HCC) 01/31/2018  . Bipolar 1 disorder, mixed, severe (HCC) 04/22/2016  . Alcohol abuse 04/22/2016  . Suicidal ideation 04/22/2016  . Tobacco use disorder 04/22/2016  . 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 TONSILLECTOMY      Prior to Admission medications   Medication Sig Start Date End Date Taking? Authorizing Provider  ibuprofen (ADVIL) 800 MG tablet Take 1  tablet (800 mg total) by mouth daily. 06/26/20   Iloabachie, Chioma E, NP  omeprazole (PRILOSEC) 20 MG capsule Take 1 capsule (20 mg total) by mouth daily. 06/26/20   Iloabachie, Chioma E, NP    Allergies Geodon [ziprasidone hcl], Valproic acid, and Levofloxacin  Family History  Problem Relation Age of Onset  . Hypertension Mother   . Diabetes Mother   . Hypertension Father   . Heart failure Father   . Diabetes Father     Social History Social History   Tobacco Use  . Smoking status: Current Every Day Smoker    Packs/day: 1.50    Types: Cigarettes  . Smokeless tobacco: Current User    Types: Chew  Vaping Use  . Vaping Use: Never used  Substance Use Topics  . Alcohol use: Not Currently    Alcohol/week: 6.0 standard drinks    Types: 6 Cans of beer per week  . Drug use: Not Currently    Types: IV    Comment: heroine     Review of Systems  Cardiovascular: No chest pain. Respiratory:   No SOB. Gastrointestinal:  No vomiting.  Musculoskeletal: Positive for hip pain. Skin: Negative for rash, abrasions, lacerations, ecchymosis. Neurological: Negative for headaches   ____________________________________________   PHYSICAL EXAM:  VITAL SIGNS: ED Triage Vitals [07/27/20 1914]  Enc Vitals Group     BP 126/67     Pulse Rate 96     Resp 18  Temp 98.6 F (37 C)     Temp Source Oral     SpO2 96 %     Weight 138 lb (62.6 kg)     Height 5\' 9"  (1.753 m)     Head Circumference      Peak Flow      Pain Score 10     Pain Loc      Pain Edu?      Excl. in GC?      Constitutional: Alert and oriented. Well appearing and in no acute distress. Eyes: Conjunctivae are normal. PERRL. EOMI. Head: Atraumatic. ENT:      Ears:      Nose: No congestion/rhinnorhea.      Mouth/Throat: Mucous membranes are moist.  Neck: No stridor.   Cardiovascular: Normal rate, regular rhythm.  Good peripheral circulation. Respiratory: Normal respiratory effort without tachypnea or  retractions. Lungs CTAB. Good air entry to the bases with no decreased or absent breath sounds. Gastrointestinal: Bowel sounds 4 quadrants. Soft and nontender to palpation. No guarding or rigidity. No palpable masses. No distention.  Musculoskeletal: Full range of motion to all extremities. No gross deformities appreciated.  Mild tenderness to palpation to right hip.  Full range of motion of right hip.  Antalgic gait. Neurologic:  Normal speech and language. No gross focal neurologic deficits are appreciated.  Skin:  Skin is warm, dry and intact. No rash noted. Psychiatric: Mood and affect are normal. Speech and behavior are normal. Patient exhibits appropriate insight and judgement.   ____________________________________________   LABS (all labs ordered are listed, but only abnormal results are displayed)  Labs Reviewed - No data to display ____________________________________________  EKG   ____________________________________________  RADIOLOGY , personally viewed and evaluated these images (plain radiographs) as part of my medical decision making, as well as reviewing the written report by the radiologist.  Lexine Baton Venous Img Lower Unilateral Right  Result Date: 07/27/2020 CLINICAL DATA:  Hip pain and leg pain EXAM: Right LOWER EXTREMITY VENOUS DOPPLER ULTRASOUND TECHNIQUE: Gray-scale sonography with compression, as well as color and duplex ultrasound, were performed to evaluate the deep venous system(s) from the level of the common femoral vein through the popliteal and proximal calf veins. COMPARISON:  None. FINDINGS: VENOUS Normal compressibility of the common femoral, superficial femoral, and popliteal veins, as well as the visualized calf veins. Visualized portions of profunda femoral vein and great saphenous vein unremarkable. No filling defects to suggest DVT on grayscale or color Doppler imaging. Doppler waveforms show normal direction of venous flow, normal  respiratory plasticity and response to augmentation. Limited views of the contralateral common femoral vein are unremarkable. OTHER None. Limitations: none IMPRESSION: Negative. Electronically Signed   By: 09/26/2020 M.D.   On: 07/27/2020 22:28   DG Hip Unilat W or Wo Pelvis 2-3 Views Right  Result Date: 07/27/2020 CLINICAL DATA:  Status post trauma. EXAM: DG HIP (WITH OR WITHOUT PELVIS) 2-3V RIGHT COMPARISON:  None. FINDINGS: There is no evidence of hip fracture or dislocation. There is no evidence of arthropathy or other focal bone abnormality. IMPRESSION: Negative. Electronically Signed   By: 09/26/2020 M.D.   On: 07/27/2020 20:44    ____________________________________________    PROCEDURES  Procedure(s) performed:    Procedures    Medications - No data to display   ____________________________________________   INITIAL IMPRESSION / ASSESSMENT AND PLAN / ED COURSE  Pertinent labs & imaging results that were available during my care of the patient were reviewed  by me and considered in my medical decision making (see chart for details).  Review of the  [place]RS was performed in accordance of the [place] prior to dispensing any controlled drugs.     Patient presented to the emergency department for evaluation of right hip pain after an ankle injury several days ago.  I suspect that hip is painful as patient has been ambulating differently due to his ankle pain.  He has not been wearing the brace that he was given.  Patient is going to wait in the emergency department until the buses start running in the morning.  He was given a bus voucher.  Patient is to follow up with PCP as directed. Patient is given ED precautions to return to the ED for any worsening or new symptoms.   [facility] was evaluated in Emergency Department on 07/27/2020 for the symptoms described in the history of present illness. He was evaluated in the context of the global COVID-19 pandemic, which  necessitated consideration that the patient might be at risk for infection with the SARS-CoV-2 virus that causes COVID-19. Institutional protocols and algorithms that pertain to the evaluation of patients at risk for COVID-19 are in a state of rapid change based on information released by regulatory bodies including the CDC and federal and state organizations. These policies and algorithms were followed during the patient's care in the ED.  ____________________________________________  FINAL CLINICAL IMPRESSION(S) / ED DIAGNOSES  Final diagnoses:  Hip pain      NEW MEDICATIONS STARTED DURING THIS VISIT:  ED Discharge Orders    None          This chart was dictated using voice recognition software/Dragon. Despite best efforts to proofread, errors can occur which can change the meaning. Any change was purely unintentional.    Enid Derry, PA-C 07/27/20 2357    Phineas Semen, MD 07/29/20 1504

## 2020-07-27 NOTE — ED Notes (Signed)
First Nurse Note: Pt to ED via [facility] for right leg pain. Pt ambulatory per EMS. Pt is in NAD

## 2020-07-27 NOTE — ED Notes (Signed)
See triage note, pt reports jumped off a 15 ft wall 3 days ago and twisted right ankle, was given a brace to wear and has not been wearing it. Now has pain to right hip. Pt ambulatory to treatment room with limp noted, refused wheelchair.  Reports taking BC powder with minimal relief

## 2020-07-27 NOTE — ED Notes (Signed)
Patient sits with head bowed and will not make eye contact with this RN.

## 2020-08-25 ENCOUNTER — Other Ambulatory Visit: Payer: Self-pay

## 2020-08-25 ENCOUNTER — Emergency Department
Admission: EM | Admit: 2020-08-25 | Discharge: 2020-08-26 | Disposition: A | Discharge status: Home / Self Care | Payer: Self-pay | Attending: Emergency Medicine | Admitting: Emergency Medicine

## 2020-08-25 ENCOUNTER — Encounter: Payer: Self-pay | Admitting: *Deleted

## 2020-08-25 DIAGNOSIS — F17210 Nicotine dependence, cigarettes, uncomplicated: Secondary | ICD-10-CM | POA: Insufficient documentation

## 2020-08-25 DIAGNOSIS — F10129 Alcohol abuse with intoxication, unspecified: Secondary | ICD-10-CM | POA: Insufficient documentation

## 2020-08-25 DIAGNOSIS — Z79899 Other long term (current) drug therapy: Secondary | ICD-10-CM | POA: Insufficient documentation

## 2020-08-25 DIAGNOSIS — F10920 Alcohol use, unspecified with intoxication, uncomplicated: Secondary | ICD-10-CM

## 2020-08-25 DIAGNOSIS — R45851 Suicidal ideations: Secondary | ICD-10-CM | POA: Insufficient documentation

## 2020-08-25 DIAGNOSIS — R45850 Homicidal ideations: Secondary | ICD-10-CM | POA: Insufficient documentation

## 2020-08-25 DIAGNOSIS — Z20822 Contact with and (suspected) exposure to covid-19: Secondary | ICD-10-CM | POA: Insufficient documentation

## 2020-08-25 LAB — COMPREHENSIVE METABOLIC PANEL
ALT: 48 U/L — ABNORMAL HIGH (ref 0–44)
AST: 45 U/L — ABNORMAL HIGH (ref 15–41)
Albumin: 4.1 g/dL (ref 3.5–5.0)
Alkaline Phosphatase: 93 U/L (ref 38–126)
Anion gap: 12 (ref 5–15)
BUN: 11 mg/dL (ref 6–20)
CO2: 24 mmol/L (ref 22–32)
Calcium: 9.1 mg/dL (ref 8.9–10.3)
Chloride: 105 mmol/L (ref 98–111)
Creatinine, Ser: 0.51 mg/dL — ABNORMAL LOW (ref 0.61–1.24)
GFR calc Af Amer: >60 mL/min (ref >60)
GFR calc non Af Amer: >60 mL/min (ref >60)
Glucose, Bld: 104 mg/dL — ABNORMAL HIGH (ref 70–99)
Potassium: 4 mmol/L (ref 3.5–5.1)
Sodium: 141 mmol/L (ref 135–145)
Total Bilirubin: 0.7 mg/dL (ref 0.3–1.2)
Total Protein: 8.3 g/dL — ABNORMAL HIGH (ref 6.5–8.1)

## 2020-08-25 LAB — CBC
HCT: 45.6 % (ref 39.0–52.0)
Hemoglobin: 15.7 g/dL (ref 13.0–17.0)
MCH: 32.6 pg (ref 26.0–34.0)
MCHC: 34.4 g/dL (ref 30.0–36.0)
MCV: 94.8 fL (ref 80.0–100.0)
Platelets: 283 10*3/uL (ref 150–400)
RBC: 4.81 MIL/uL (ref 4.22–5.81)
RDW: 14.3 % (ref 11.5–15.5)
WBC: 10.6 10*3/uL — ABNORMAL HIGH (ref 4.0–10.5)
nRBC: 0 % (ref 0.0–0.2)

## 2020-08-25 LAB — SARS CORONAVIRUS 2 BY RT PCR (HOSPITAL ORDER, PERFORMED IN ~~LOC~~ HOSPITAL LAB): SARS Coronavirus 2: NEGATIVE

## 2020-08-25 LAB — ACETAMINOPHEN LEVEL: Acetaminophen (Tylenol), Serum: <10 ug/mL — ABNORMAL LOW (ref 10–30)

## 2020-08-25 LAB — ETHANOL: Alcohol, Ethyl (B): 203 mg/dL — ABNORMAL HIGH (ref <10)

## 2020-08-25 LAB — SALICYLATE LEVEL: Salicylate Lvl: <7 mg/dL — ABNORMAL LOW (ref 7.0–30.0)

## 2020-08-25 MED ORDER — THIAMINE HCL 100 MG PO TABS
100.0000 mg | ORAL_TABLET | Freq: Every day | ORAL | Status: DC
  Administered 2020-08-26: 100 mg via ORAL
  Filled 2020-08-25: qty 1

## 2020-08-25 MED ORDER — LORAZEPAM 2 MG PO TABS
0.0000 mg | ORAL_TABLET | Freq: Four times a day (QID) | ORAL | Status: DC
  Administered 2020-08-25 – 2020-08-26 (×2): 1 mg via ORAL
  Filled 2020-08-25 (×2): qty 1

## 2020-08-25 MED ORDER — LORAZEPAM 2 MG/ML IJ SOLN: 0.0000 mg | Freq: Four times a day (QID) | INTRAMUSCULAR | Status: DC

## 2020-08-25 MED ORDER — LORAZEPAM 2 MG/ML IJ SOLN: 0.0000 mg | Freq: Two times a day (BID) | INTRAMUSCULAR | Status: DC

## 2020-08-25 MED ORDER — LORAZEPAM 2 MG PO TABS: 0.0000 mg | ORAL_TABLET | Freq: Two times a day (BID) | ORAL | Status: DC

## 2020-08-25 MED ORDER — THIAMINE HCL 100 MG/ML IJ SOLN: 100.0000 mg | Freq: Every day | INTRAMUSCULAR | Status: DC

## 2020-08-25 NOTE — ED Notes (Signed)
Pt. Alert and oriented, warm and dry, in no distress. Pt. Denies SI, and AVH. Pt states he is having HI thoughts but would not tell this Clinical research associate who he is having those thoughts about. Patient states he has been doing really well and not been doing drugs except smoking a little weed. Patient states he has been drinking today. Pt. Encouraged to let nursing staff know of any concerns or needs.

## 2020-08-25 NOTE — ED Triage Notes (Addendum)
Pt brought in by police.  Pt cursing in triage.  Police in with pt.  Pt aggressive in triage.  Pt not dressed out, labs not done, no vital signs done in triage.  Pt taken to 20 h by officers.

## 2020-08-25 NOTE — ED Notes (Signed)
Patient dressed out by this Clinical research associate.  Patient belongings placed in bag and labeled.   Black shoes Pair socks [employer] with chain Two pair of shorts Pair underwear t-shirt

## 2020-08-25 NOTE — ED Provider Notes (Signed)
[facility] Emergency Department Provider Note   ____________________________________________   First MD Initiated Contact with Patient 08/25/20 2210     (approximate)  I have reviewed the triage vital signs and the nursing notes.   HISTORY  Chief Complaint Behavior Problem    HPI Moustafa Mossa Bachus is a 34 y.o. male with past medical history of bipolar disorder, alcohol abuse, polysubstance abuse, and hepatitis C who presents to the ED for psychiatric evaluation.  Patient reports that "I have been doing so well, but then I messed up."  He states that he has been drinking this evening and is having thoughts of "killing something."  He would not elaborate on any specific person he is thinking of harming.  When asked if he is having any thoughts of harming himself, he says "a little."  He admits to marijuana use but denies any other drug abuse.  He denies any medical complaints.        Past Medical History:  Diagnosis Date  . Bipolar 1 disorder (HCC)   . Chronic headache   . Depression 03/30/2020  . ETOH abuse 03/30/2020  . 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 Horseshoe kidney   . Marijuana smoker 03/30/2020    Patient Active Problem List   Diagnosis Date Noted  . Schizoaffective disorder (HCC) 07/26/2020  . Chronic headache 06/26/2020  . Encounter to establish care 06/26/2020  . History of gastroesophageal reflux (GERD) 06/26/2020  . Non-suicidal self harm 04/05/2020  . Bipolar 1 disorder, depressed (HCC) 01/31/2018  . Bipolar 1 disorder, mixed, severe (HCC) 04/22/2016  . Alcohol abuse 04/22/2016  . Suicidal ideation 04/22/2016  . Tobacco use disorder 04/22/2016  . 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 TONSILLECTOMY      Prior to Admission medications   Medication Sig Start Date End Date Taking? Authorizing Provider  ibuprofen (ADVIL) 800 MG tablet Take 1  tablet (800 mg total) by mouth daily. 06/26/20   Iloabachie, Chioma E, NP  omeprazole (PRILOSEC) 20 MG capsule Take 1 capsule (20 mg total) by mouth daily. 06/26/20   Iloabachie, Chioma E, NP    Allergies Geodon [ziprasidone hcl], Valproic acid, and Levofloxacin  Family History  Problem Relation Age of Onset  . Hypertension Mother   . Diabetes Mother   . Hypertension Father   . Heart failure Father   . Diabetes Father     Social History Social History   Tobacco Use  . Smoking status: Current Every Day Smoker    Packs/day: 1.50    Types: Cigarettes  . Smokeless tobacco: Current User    Types: Chew  Vaping Use  . Vaping Use: Never used  Substance Use Topics  . Alcohol use: Not Currently    Alcohol/week: 6.0 standard drinks    Types: 6 Cans of beer per week  . Drug use: Not Currently    Types: IV    Comment: heroine    Review of Systems  Constitutional: No fever/chills Eyes: No visual changes. ENT: No sore throat. Cardiovascular: Denies chest pain. Respiratory: Denies shortness of breath. Gastrointestinal: No abdominal pain.  No nausea, no vomiting.  No diarrhea.  No constipation. Genitourinary: Negative for dysuria. Musculoskeletal: Negative for back pain. Skin: Negative for rash. Neurological: Negative for headaches, focal weakness or numbness.  Positive for homicidal and suicidal ideation.  ____________________________________________   PHYSICAL EXAM:  VITAL SIGNS: ED Triage Vitals [08/25/20 2150]  Enc Vitals Group     BP      Pulse      Resp      Temp      Temp src      SpO2      Weight 138 lb (62.6 kg)     Height 5\' 9"  (1.753 m)     Head Circumference      Peak Flow      Pain Score 0     Pain Loc      Pain Edu?      Excl. in GC?     Constitutional: Alert and oriented. Eyes: Conjunctivae are normal. Head: Atraumatic. Nose: No congestion/rhinnorhea. Mouth/Throat: Mucous membranes are moist. Neck: Normal ROM Cardiovascular: Normal rate,  regular rhythm. Grossly normal heart sounds. Respiratory: Normal respiratory effort.  No retractions. Lungs CTAB. Gastrointestinal: Soft and nontender. No distention. Genitourinary: deferred Musculoskeletal: No lower extremity tenderness nor edema. Neurologic:  Normal speech and language. No gross focal neurologic deficits are appreciated. Skin:  Skin is warm, dry and intact. No rash noted. Psychiatric: Labile affect, normal mood. Speech and behavior are normal.  ____________________________________________   LABS (all labs ordered are listed, but only abnormal results are displayed)  Labs Reviewed  COMPREHENSIVE METABOLIC PANEL - Abnormal; Notable for the following components:      Result Value   Glucose, Bld 104 (*)    Creatinine, Ser 0.51 (*)    Total Protein 8.3 (*)    AST 45 (*)    ALT 48 (*)    All other components within normal limits  SALICYLATE LEVEL - Abnormal; Notable for the following components:   Salicylate Lvl <7.0 (*)    All other components within normal limits  ACETAMINOPHEN LEVEL - Abnormal; Notable for the following components:   Acetaminophen (Tylenol), Serum <10 (*)    All other components within normal limits  CBC - Abnormal; Notable for the following components:   WBC 10.6 (*)    All other components within normal limits  SARS CORONAVIRUS 2 BY RT PCR (HOSPITAL ORDER, PERFORMED IN [place] [facility])  ETHANOL  URINE DRUG SCREEN, QUALITATIVE ([facility] ONLY)    PROCEDURES  Procedure(s) performed (including Critical Care):  Procedures   ____________________________________________   INITIAL IMPRESSION / ASSESSMENT AND PLAN / ED COURSE       34 year old male with past medical history of bipolar disorder, alcohol abuse, polysubstance abuse, and hepatitis C who presents to the ED for psychiatric evaluation.  He states he has been drinking this evening and endorses both homicidal and suicidal ideation, but does not voice a specific plan or someone  specific that he is thinking of harming.  He does seem to have a labile affect and I am concerned that he could be manic.  We will place him under IVC.  He denies any medical complaints and screening labs are unremarkable, patient may be medically cleared for psychiatric evaluation.  The patient has been placed in psychiatric observation due to the need to provide a safe environment for the patient while obtaining psychiatric consultation and evaluation, as well as ongoing medical and medication management to treat the patient's condition.  The patient has been placed under full IVC at this time.       ____________________________________________   FINAL CLINICAL IMPRESSION(S) / ED DIAGNOSES  Final diagnoses:  Homicidal ideation  Suicidal ideation  Alcoholic intoxication without complication [facility])     ED Discharge Orders    None  Note:  This document was prepared using Dragon voice recognition software and may include unintentional dictation errors.   Chesley Noon, MD 08/25/20 2256

## 2020-08-26 MED ORDER — IBUPROFEN 800 MG PO TABS
400.0000 mg | ORAL_TABLET | Freq: Once | ORAL | Status: AC
  Administered 2020-08-26: 400 mg via ORAL
  Filled 2020-08-26: qty 1

## 2020-08-26 NOTE — Discharge Instructions (Addendum)
Please try not to drink so much. You may try AA. They are very helpful in cases like yours. Please return as needed.

## 2020-08-26 NOTE — ED Notes (Signed)
Papers rescinded, pending D/C 

## 2020-08-26 NOTE — ED Notes (Signed)
Pt discharging home. Discharge teaching done and pt verbalized understanding. Pt given brochure with shelter resources listing from [facility]. Pt signed discharge paper form. Given all his personal belongings. Escorted to the lobby, ambulatory and in NAD.

## 2020-08-26 NOTE — BH Assessment (Signed)
Assessment Note  Nathan [name] is an 34 y.o. male who presents to [facility] ED involuntarily for treatment. Per triage note, Pt brought in by police.  Pt cursing in triage.  Police in with pt.  Pt aggressive in triage.  Pt not dressed out, labs not done, no vital signs done in triage.  Pt taken to 20 h by officers.    During TTS assessment pt presents alert and oriented x 3, irritable but cooperative, and mood-congruent with affect. The pt does not appear to be responding to internal or external stimuli. Neither is the pt presenting with any delusional thinking. Pt verified the information provided to triage RN. Pt reports becoming upset last night after his uncle kicked him out of his home for no reason. Pt states, "I've been doing everything I am suppose to do and got put out on the streets for no reason". Pt reports to currently be homeless and in need of a place to go. Pt admits to endorsing HI towards his uncle last night but denies any current HI towards him or a plan. Pt reports recently staring a job at [employer] and expressed the need to leave today in order to go to work tomorrow. Pt reports a hx of SA (alcohol 1x week, Marijuana 1-2 x a month) but expressed to need no support with substance abuse. Pt states, "I've tried the local programs like [facility] and [facility] but honestly they made me worse not better". Pt reports an INPT hx years ago with [facility] but per pt's chart, his last admission was 07/26/20. Pt reports a MH hx of schizophrenia, alcohol dependence, depression and personality disorder. Pt denied compliance with medications for years due to his disliked of their side effects. Pt states "I've been getting by this long without medications I do not feel I need them". Pt reports a family hx of schizophrenia, depression, alcohol dependence and personality disorder. Pt reports to be eating and sleeping "just fine until last night". Pt denies any current OPT and expressed to be uninterested. Pt denies any  current SI/HI/AH/VH and contracted for safety stating 'I just need resources for a place to stay, I can't stay her overnight I have to work tomorrow. Pt provided his ex-girlfriend Luetta Nutting [phone]) as a collateral contact.   TTS attempted to contact Toniann Fail but was only able to leave a HIPPA complaint voicemail. TTS is awaiting a call back.   Disposition is pending psych consult.    Diagnosis: HI   Past Medical History:  Past Medical History:  Diagnosis Date  . Bipolar 1 disorder (HCC)   . Chronic headache   . Depression 03/30/2020  . ETOH abuse 03/30/2020  . 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 Horseshoe kidney   . Marijuana smoker 03/30/2020    Past Surgical History:  Procedure Laterality Date  . TONSILLECTOMY      Family History:  Family History  Problem Relation Age of Onset  . Hypertension Mother   . Diabetes Mother   . Hypertension Father   . Heart failure Father   . Diabetes Father     Social History:  reports that he has been smoking cigarettes. He has been smoking about 1.50 packs per day. His smokeless tobacco use includes chew. He reports previous alcohol use of about 6.0 standard drinks of alcohol per week. He reports previous drug use. Drug: IV.  Additional Social History:  Alcohol / Drug Use Pain Medications: see mar Prescriptions: see mar Over the Counter:  see mar History of alcohol / drug use?: Yes Substance #1 Name of Substance 1: Marijuana Substance #2 Name of Substance 2: alcohol  CIWA: CIWA-Ar BP: 129/85 Pulse Rate: 69 Nausea and Vomiting: no nausea and no vomiting Tactile Disturbances: none Tremor: no tremor Auditory Disturbances: not present Paroxysmal Sweats: no sweat visible Visual Disturbances: not present Anxiety: no anxiety, at ease Headache, Fullness in Head: none present Agitation: normal activity Orientation and Clouding of Sensorium: oriented and can do serial additions CIWA-Ar Total: 0 COWS:    Allergies:  Allergies   Allergen Reactions  . Geodon [Ziprasidone Hcl] Other (See Comments)    Seizures  . Onion Anaphylaxis  . Valproic Acid   . Levofloxacin Itching    Home Medications: (Not in a hospital admission)   OB/GYN Status:  No LMP for male patient.  General Assessment Data Location of Assessment: [facility] ED TTS Assessment: In system Is this a Tele or Face-to-Face Assessment?: Face-to-Face Is this an Initial Assessment or a Re-assessment for this encounter?: Initial Assessment Patient Accompanied by:: N/A Language Other than English: No What is your preferred language:  (n/a) Living Arrangements: Homeless/Shelter What gender do you identify as?: Male Date Telepsych consult ordered in [facility]: 08/25/20 Time Telepsych consult ordered in [facility]: 2127 Marital status: Single Maiden name: n/a Pregnancy Status: No Living Arrangements: Other (Comment) (Pt reports previously living with uncle ) Can pt return to current living arrangement?: Yes Admission Status: Involuntary Petitioner: ED Attending Is patient capable of signing voluntary admission?: Yes Referral Source: Other (BPD) Insurance type: None      Crisis Care Plan Living Arrangements: Other (Comment) (Pt reports previously living with uncle ) Legal Guardian:  (self ) Name of Psychiatrist: None reported  Name of Therapist: None reported   Education Status Is patient currently in school?: No Is the patient employed, 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 : Yes Suicidal Intent: No-Not Currently/Within Last 6 Months Has patient had any suicidal intent within the past 6 months prior to admission? : Yes Is patient at risk for suicide?: Yes Suicidal Plan?: No Has patient had any suicidal plan within the past 6 months prior to admission? : No Access to  Means: No What has been your use of drugs/alcohol within the last 12 months?: Marijuana & alcohol  Previous Attempts/Gestures: Yes How many times?: 3 Other Self Harm Risks: None reported  Triggers for Past Attempts: Unpredictable Intentional Self Injurious Behavior: None Comment - Self Injurious Behavior: None reported  Family Suicide History: Unknown Recent stressful life event(s): Conflict (Comment) (uncle kicked him out of home) Persecutory voices/beliefs?: No Depression: Yes Depression Symptoms: Feeling angry/irritable, Despondent Substance abuse history and/or treatment for substance abuse?: Yes ([facility] & [facility]) Suicide prevention information given to non-admitted patients: Yes  Risk to Others within the past 6 months Homicidal Ideation: No-Not Currently/Within Last 6 Months Does patient have any lifetime risk of violence toward others beyond the six months prior to admission? : Unknown Thoughts of Harm to Others: No Current Homicidal Intent: No Current Homicidal Plan: No Access to Homicidal Means: No Identified Victim: n/a History of harm to others?: No Assessment of Violence: On admission Violent Behavior Description: verbal aggression  Does patient have access to weapons?: No Criminal Charges Pending?: No Does patient have a court date: No Is patient on probation?: No  Psychosis Hallucinations: None noted Delusions: None noted  Mental Status Report Appearance/Hygiene: In scrubs Eye Contact: Poor Motor Activity: Freedom of movement Speech: Logical/coherent, Loud Level of Consciousness: Restless, Irritable, Alert Mood: Depressed, Anxious, Irritable Affect: Anxious, Depressed, Irritable Anxiety Level: Minimal Thought Processes: Coherent, Relevant Judgement: Partial Orientation: Appropriate for developmental age Obsessive Compulsive Thoughts/Behaviors: None  Cognitive Functioning Concentration: Fair Memory: Recent Intact, Remote Intact Is patient IDD:  No Insight: Poor Impulse Control: Poor Appetite: Good Have you had any weight changes? : No Change Sleep: No Change Total Hours of Sleep:  (pt reports a few hours) Vegetative Symptoms: None  ADLScreening [facility] Assessment Services) Patient's cognitive ability adequate to safely complete daily activities?: Yes Patient able to express need for assistance with ADLs?: Yes Independently performs ADLs?: Yes (appropriate for developmental age)  Prior Inpatient Therapy Prior Inpatient Therapy: Yes Prior Therapy Dates: 07/26/20 Prior Therapy Facilty/Provider(s): [facility] Reason for Treatment: MH  Prior Outpatient Therapy Prior Outpatient Therapy: Yes Prior Therapy Dates:  (Pt reports years ago) Prior Therapy Facilty/Provider(s): [facility] Reason for Treatment: Depression & Schizophrenia Does patient have an ACCT team?: No Does patient have Intensive In-House Services?  : No Does patient have [facility] services? : Unknown Does patient have P4CC services?: Unknown  ADL Screening (condition at time of admission) Patient's cognitive ability adequate to safely complete daily activities?: Yes Is the patient deaf or have difficulty hearing?: No Does the patient have difficulty seeing, even when wearing glasses/contacts?: No Does the patient have difficulty concentrating, remembering, or making decisions?: No Patient able to express need for assistance with ADLs?: Yes Does the patient have difficulty dressing or bathing?: No Independently performs ADLs?: Yes (appropriate for developmental age) Does the patient have difficulty walking or climbing stairs?: No Weakness of Legs: None Weakness of Arms/Hands: None  Home Assistive Devices/Equipment Home Assistive Devices/Equipment: None  Therapy Consults (therapy consults require a physician order) PT Evaluation Needed: No OT Evalulation Needed: No SLP Evaluation Needed: No Abuse/Neglect Assessment (Assessment to be complete while patient is  alone) Abuse/Neglect Assessment Can Be Completed: Yes Physical Abuse: Denies Verbal Abuse: Denies Sexual Abuse: Denies Exploitation of patient/patient's resources: Denies Self-Neglect: Denies Values / Beliefs Cultural Requests During Hospitalization: None Spiritual Requests During Hospitalization: None Consults Spiritual Care Consult Needed: No Transition of Care Team Consult Needed: No Advance Directives (For Healthcare) Does Patient Have a Medical Advance Directive?: No          Disposition:  Disposition Initial Assessment Completed for this Encounter: Yes Patient referred to: Other (Comment)  On Site Evaluation by:   Reviewed with Physician:    Opal Sidles 08/26/2020 11:25 AM

## 2020-08-26 NOTE — ED Provider Notes (Signed)
Emergency Medicine Observation Re-evaluation Note  Nathan [name] is a 34 y.o. male, seen on rounds today.  Pt initially presented to the ED for complaints of Behavior Problem Currently, the patient is resting.  Physical Exam  BP 129/85   Pulse 69   Temp 97.7 F (36.5 C) (Oral)   Resp 18   Ht 1.753 m (5\' 9" )   Wt 62.6 kg   SpO2 100%   BMI 20.38 kg/m  Physical Exam Gen:  No acute distress Resp:  Breathing easily and comfortably, no accessory muscle usage Neuro:  Moving all four extremities, no gross focal neuro deficits Psych:  Resting currently, calm and cooperative for now  ED Course / MDM  EKG:    I have reviewed the labs performed to date as well as medications administered while in observation.  Recent changes in the last 24 hours include initial ED evaluation.  Plan  Current plan is for psychiatry assessment and disposition plan. Patient is under full IVC at this time.   , MD 08/26/20 [phone]

## 2020-08-26 NOTE — ED Provider Notes (Signed)
Pt cleared by psych. Is ready for discharge. No new meds recomended   Arnaldo Natal, MD 08/26/20 [phone]

## 2020-08-26 NOTE — Final Progress Note (Signed)
Physician Final Progress Note  Patient ID: Nathan [name] MRN: [id number] DOB/AGE: Jul 17, 1986 34 y.o.  Admit date: 08/25/2020 Admitting provider: No admitting provider for patient encounter. Discharge date: 08/26/2020   Admission Diagnoses:   EToh intoxication Adjustment disorder    Patient wants to go home post intoxication He contracts for safety and he is not actively Si or HI   Alert cooperative oriented times four Not clouded or fluctuant for sensorium Concentration normal Speech normal  Thought process and content normal Memory normal Judgment insight reliability fair  No movement problems shakes tics tremors NO Si or HI Abstraction okay   Fund of knowledge and intelligence fair Mood and affect improved  Leans not k nown Handedness not known Musculoskeletal normal Aims not done Recall okay Psychomotor activity normal  Cognition impaired when intoxicated  Akathisia none            Discharge Diagnoses:  Active Problems:   * No active hospital problems. *  same   Consults:  TTS ---ED MD Psych MD  Significant Findings/ Diagnostic Studies: { none Procedures:  None   ischarge Condition: {fair   Disposition: he wants to go to shelter  Contracts for safety   Diet: {as tolerated   Discharge Activity: {   As tolerated      Total time spent taking care of this patient: 30  minutes  Signed: Roselind Messier 08/26/2020, 3:32 PM

## 2020-08-27 ENCOUNTER — Emergency Department
Admission: EM | Admit: 2020-08-27 | Discharge: 2020-08-28 | Disposition: A | Discharge status: Home / Self Care | Payer: Self-pay | Attending: Emergency Medicine | Admitting: Emergency Medicine

## 2020-08-27 ENCOUNTER — Encounter: Payer: Self-pay | Admitting: Emergency Medicine

## 2020-08-27 ENCOUNTER — Other Ambulatory Visit: Payer: Self-pay

## 2020-08-27 DIAGNOSIS — F3110 Bipolar disorder, current episode manic without psychotic features, unspecified: Secondary | ICD-10-CM | POA: Insufficient documentation

## 2020-08-27 DIAGNOSIS — F17210 Nicotine dependence, cigarettes, uncomplicated: Secondary | ICD-10-CM | POA: Insufficient documentation

## 2020-08-27 DIAGNOSIS — T50902A Poisoning by unspecified drugs, medicaments and biological substances, intentional self-harm, initial encounter: Secondary | ICD-10-CM | POA: Insufficient documentation

## 2020-08-27 DIAGNOSIS — Z20822 Contact with and (suspected) exposure to covid-19: Secondary | ICD-10-CM | POA: Insufficient documentation

## 2020-08-27 LAB — CBC
HCT: 46.7 % (ref 39.0–52.0)
Hemoglobin: 16 g/dL (ref 13.0–17.0)
MCH: 32.7 pg (ref 26.0–34.0)
MCHC: 34.3 g/dL (ref 30.0–36.0)
MCV: 95.5 fL (ref 80.0–100.0)
Platelets: 269 10*3/uL (ref 150–400)
RBC: 4.89 MIL/uL (ref 4.22–5.81)
RDW: 13.9 % (ref 11.5–15.5)
WBC: 10.6 10*3/uL — ABNORMAL HIGH (ref 4.0–10.5)
nRBC: 0 % (ref 0.0–0.2)

## 2020-08-27 LAB — COMPREHENSIVE METABOLIC PANEL
ALT: 47 U/L — ABNORMAL HIGH (ref 0–44)
AST: 45 U/L — ABNORMAL HIGH (ref 15–41)
Albumin: 4.4 g/dL (ref 3.5–5.0)
Alkaline Phosphatase: 95 U/L (ref 38–126)
Anion gap: 10 (ref 5–15)
BUN: 17 mg/dL (ref 6–20)
CO2: 24 mmol/L (ref 22–32)
Calcium: 9.4 mg/dL (ref 8.9–10.3)
Chloride: 101 mmol/L (ref 98–111)
Creatinine, Ser: 0.62 mg/dL (ref 0.61–1.24)
GFR calc Af Amer: >60 mL/min (ref >60)
GFR calc non Af Amer: >60 mL/min (ref >60)
Glucose, Bld: 92 mg/dL (ref 70–99)
Potassium: 4.3 mmol/L (ref 3.5–5.1)
Sodium: 135 mmol/L (ref 135–145)
Total Bilirubin: 0.9 mg/dL (ref 0.3–1.2)
Total Protein: 8.5 g/dL — ABNORMAL HIGH (ref 6.5–8.1)

## 2020-08-27 LAB — LIPASE, BLOOD: Lipase: 29 U/L (ref 11–51)

## 2020-08-27 LAB — URINE DRUG SCREEN, QUALITATIVE (ARMC ONLY)
Amphetamines, Ur Screen: NOT DETECTED
Barbiturates, Ur Screen: NOT DETECTED
Benzodiazepine, Ur Scrn: NOT DETECTED
Cannabinoid 50 Ng, Ur ~~LOC~~: POSITIVE — AB
Cocaine Metabolite,Ur ~~LOC~~: NOT DETECTED
MDMA (Ecstasy)Ur Screen: NOT DETECTED
Methadone Scn, Ur: NOT DETECTED
Opiate, Ur Screen: NOT DETECTED
Phencyclidine (PCP) Ur S: NOT DETECTED
Tricyclic, Ur Screen: NOT DETECTED

## 2020-08-27 LAB — ACETAMINOPHEN LEVEL
Acetaminophen (Tylenol), Serum: <10 ug/mL — ABNORMAL LOW (ref 10–30)
Acetaminophen (Tylenol), Serum: <10 ug/mL — ABNORMAL LOW (ref 10–30)
Acetaminophen (Tylenol), Serum: <10 ug/mL — ABNORMAL LOW (ref 10–30)

## 2020-08-27 LAB — SALICYLATE LEVEL: Salicylate Lvl: <7 mg/dL — ABNORMAL LOW (ref 7.0–30.0)

## 2020-08-27 LAB — ETHANOL: Alcohol, Ethyl (B): <10 mg/dL (ref <10)

## 2020-08-27 MED ORDER — SODIUM CHLORIDE 0.9 % IV BOLUS
1000.0000 mL | Freq: Once | INTRAVENOUS | Status: AC
  Administered 2020-08-27: 1000 mL via INTRAVENOUS

## 2020-08-27 MED ORDER — ONDANSETRON HCL 4 MG/2ML IJ SOLN
4.0000 mg | Freq: Once | INTRAMUSCULAR | Status: AC
  Administered 2020-08-27: 4 mg via INTRAVENOUS
  Filled 2020-08-27: qty 2

## 2020-08-27 MED ORDER — CHARCOAL ACTIVATED PO LIQD
50.0000 g | Freq: Once | ORAL | Status: AC
  Administered 2020-08-27: 50 g via ORAL
  Filled 2020-08-27: qty 240

## 2020-08-27 NOTE — BH Assessment (Signed)
Assessment Note  Nathan [name] is an 34 y.o. male. Per triage note: Pt presents via police escort with c/o overdose on Tylenol. Pt reports taking about 30 Tylenol before arrival. Pt states he took pills around 45 minutes ago. Pt now c/o RUQ abdominal pain, nausea, and fatigue. Pt endorses taking pills to hurt self.   Pt presented to the ED involuntarily due to an intentional overdose on an unknown amount of Tylenol. Pt was resting upon this writer's arrival. Patient had a irritable mood and an anxious affect. Pt was grimacing in bed and was restlessly tossing and turning in the bed. Pt presented with logical and coherent speech. The patient's memory was seemingly impaired as he could not recall any specifics about how many pills he'd consumed or what triggered his self-harming behavior. When asked what had brought him into the ED the patient stated, "I thought I was ok. I usually don't take action like this." Pt had a soft voice tone and his speech was slurred. Patient had seemingly good insight and admitted that he needs help. Denies any hallucinations, suicidal/homicidal ideations, or paranoia.     Diagnosis: Bipolar 1 disorder  Past Medical History:  Past Medical History:  Diagnosis Date  . Bipolar 1 disorder (HCC)   . Chronic headache   . Depression 03/30/2020  . ETOH abuse 03/30/2020  . 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 Horseshoe kidney   . Marijuana smoker 03/30/2020    Past Surgical History:  Procedure Laterality Date  . TONSILLECTOMY      Family History:  Family History  Problem Relation Age of Onset  . Hypertension Mother   . Diabetes Mother   . Hypertension Father   . Heart failure Father   . Diabetes Father     Social History:  reports that he has been smoking cigarettes. He has been smoking about 1.50 packs per day. His smokeless tobacco use includes chew. He reports previous alcohol use of about 6.0 standard drinks of alcohol per week. He reports previous drug  use. Drug: IV.  Additional Social History:  Alcohol / Drug Use Pain Medications: See PTA Prescriptions: See PTA History of alcohol / drug use?: Yes  CIWA: CIWA-Ar BP: 112/74 Pulse Rate: 78 COWS:    Allergies:  Allergies  Allergen Reactions  . Geodon [Ziprasidone Hcl] Other (See Comments)    Seizures  . Onion Anaphylaxis  . Valproic Acid   . Levofloxacin Itching    Home Medications: (Not in a hospital admission)   OB/GYN Status:  No LMP for male patient.  General Assessment Data Location of Assessment: [facility] ED TTS Assessment: In system Is this a Tele or Face-to-Face Assessment?: Face-to-Face Is this an Initial Assessment or a Re-assessment for this encounter?: Initial Assessment Patient Accompanied by:: N/A Language Other than English: No What is your preferred language:  (N/A) Living Arrangements: Homeless/Shelter What gender do you identify as?: Male Date Telepsych consult ordered in [facility]: 08/27/20 Time Telepsych consult ordered in [facility]: 1259 Marital status: Single Maiden name: N/A Pregnancy Status: No Living Arrangements: Other (Comment) Can pt return to current living arrangement?: Yes Admission Status: Involuntary Petitioner: Police Is patient capable of signing voluntary admission?: Yes Referral Source: Other (BPD) Insurance type: None  Medical Screening Exam [facility] Walk-in ONLY) Medical Exam completed: Yes  Crisis Care Plan Living Arrangements: Other (Comment) Legal Guardian: Other: (Self) Name of Psychiatrist: None reported  Name of Therapist: None reported   Education Status Is patient currently in school?: No  Is the patient employed, unemployed or receiving disability?: Employed  Risk to self with the past 6 months Suicidal Ideation: No Has patient been a risk to self within the past 6 months prior to admission? : Yes Suicidal Intent: No Has patient had any suicidal intent within the past 6 months prior to admission? : Yes Is patient at risk  for suicide?: Yes Suicidal Plan?: Yes-Currently Present Has patient had any suicidal plan within the past 6 months prior to admission? : No Specify Current Suicidal Plan: Overdose Access to Means: No What has been your use of drugs/alcohol within the last 12 months?: Polysubstance Previous Attempts/Gestures: Yes How many times?: 1 Other Self Harm Risks: Depression Triggers for Past Attempts: Unpredictable Intentional Self Injurious Behavior:  (Overdose) Comment - Self Injurious Behavior: OD Family Suicide History: Unknown Recent stressful life event(s): Conflict (Comment) Persecutory voices/beliefs?: No Depression: Yes Depression Symptoms: Feeling worthless/self pity Substance abuse history and/or treatment for substance abuse?: Yes Suicide prevention information given to non-admitted patients: Not applicable  Risk to Others within the past 6 months Homicidal Ideation: No Does patient have any lifetime risk of violence toward others beyond the six months prior to admission? : No Thoughts of Harm to Others: No Current Homicidal Intent: No Current Homicidal Plan: No Access to Homicidal Means: No Identified Victim: n/a History of harm to others?: No Assessment of Violence: None Noted Violent Behavior Description: None noted Does patient have access to weapons?: No Criminal Charges Pending?: No Does patient have a court date: No Is patient on probation?: No  Psychosis Hallucinations: None noted Delusions: None noted  Mental Status Report Appearance/Hygiene: In scrubs Eye Contact: Poor Motor Activity: Freedom of movement Speech: Slurred Level of Consciousness: Restless, Irritable, Alert Mood: Irritable Affect: Anxious, Depressed, Irritable Anxiety Level: Moderate Thought Processes: Coherent, Relevant Judgement: Impaired Orientation: Appropriate for developmental age Obsessive Compulsive Thoughts/Behaviors: None  Cognitive Functioning Concentration: Decreased Memory:  Remote Intact, Recent Impaired Insight: Fair Impulse Control: Poor Appetite: Poor Have you had any weight changes? : No Change Sleep: No Change Total Hours of Sleep:  (3 hours) Vegetative Symptoms: None  ADLScreening [facility] Assessment Services) Patient's cognitive ability adequate to safely complete daily activities?: Yes Patient able to express need for assistance with ADLs?: Yes Independently performs ADLs?: Yes (appropriate for developmental age)  Prior Inpatient Therapy Prior Inpatient Therapy: Yes Prior Therapy Dates: 07/26/20 Prior Therapy Facilty/Provider(s): [facility] Reason for Treatment: MH  Prior Outpatient Therapy Prior Outpatient Therapy: Yes Prior Therapy Dates:  (Unable to recall) Prior Therapy Facilty/Provider(s): [facility] Reason for Treatment: Depression & Schizophrenia Does patient have an ACCT team?: No Does patient have Intensive In-House Services?  : No Does patient have [facility] services? : No Does patient have P4CC services?: No  ADL Screening (condition at time of admission) Patient's cognitive ability adequate to safely complete daily activities?: Yes Is the patient deaf or have difficulty hearing?: No Does the patient have difficulty seeing, even when wearing glasses/contacts?: No Does the patient have difficulty concentrating, remembering, or making decisions?: No Patient able to express need for assistance with ADLs?: Yes Does the patient have difficulty dressing or bathing?: No Independently performs ADLs?: Yes (appropriate for developmental age) Does the patient have difficulty walking or climbing stairs?: No Weakness of Legs: None Weakness of Arms/Hands: None  Home Assistive Devices/Equipment Home Assistive Devices/Equipment: None  Therapy Consults (therapy consults require a physician order) PT Evaluation Needed: No OT Evalulation Needed: No SLP Evaluation Needed: No Abuse/Neglect Assessment (Assessment to be complete while patient is  alone) Physical  Abuse: Denies Verbal Abuse: Denies Sexual Abuse: Denies Exploitation of patient/patient's resources: Denies Self-Neglect: Denies Values / Beliefs Cultural Requests During Hospitalization: None Spiritual Requests During Hospitalization: None Consults Spiritual Care Consult Needed: No Transition of Care Team Consult Needed: No Advance Directives (For Healthcare) Does Patient Have a Medical Advance Directive?: No Would patient like information on creating a medical advance directive?: No - Patient declined          Disposition: Per Dr. Smith Robert, pt is recommended for inpatient treatment.  Disposition Initial Assessment Completed for this Encounter: Yes  On Site Evaluation by:   Reviewed with Physician:    Foy Guadalajara 08/27/2020 9:37 PM

## 2020-08-27 NOTE — ED Notes (Signed)
IVC PENDING  CONSULT ?

## 2020-08-27 NOTE — BH Assessment (Signed)
Referral information for Psychiatric Hospitalization faxed to;   [facility] ([phone]-or- [phone]),    [facility] ([phone]---[phone]---[phone]),   [facility] ([phone]),    [facility] ([phone] -or- [phone]),    [facility] ([phone])   [facility] ([phone]--[phone])  

## 2020-08-27 NOTE — ED Triage Notes (Signed)
Pt presents via police escort with c/o overdose on tylenol. Pt reports taking about 30 tylenol before arrival. Pt states he took pills around 45 minutes ago. Pt now c/o RUQ abdominal pain, nausea, and fatigue. Pt endorses taking pills to hurt self.

## 2020-08-27 NOTE — Consult Note (Signed)
[facility]   Reason for Consult:   Back in ER after one day discharge after ETOH intoxication withdrawal and detox.     Referring Physician:   ER MD  Patient Identification: Nathan [name] MRN:  [id number] Principal Diagnosis: <principal problem not specified> Diagnosis:  Active Problems:   * No active hospital problems. *   Total Time spent with patient: 40 min or so  Subjective:   Nathan [name] is a 34 y.o. male patient admitted with ETOH dependence, major depression, personality disorder   HPI:   Just discharged yesterday and is back after taking OD of tylenol.  He is vague on motives but felt depressed, hopeless, helpless has lack of energy concentration, motivation lack of sleep ---worthiness and appetite.   He has ongoing anxiety witih excessive worry, nervousness tension frustration doom gloom dread feelings,  Panic symptoms frozen and numb feelings  Active attempt and cannot contract for safety   Needs Inpatient Psych admission      Past Psychiatric History:  Multiple visits to ER and to rehab and detox just discharged yesterday   Risk to Self:  high  Risk to Others:  low  Prior Inpatient Therapy:  this past week Prior Outpatient Therapy:  none regular   Past Medical History:  Past Medical History:  Diagnosis Date  . Bipolar 1 disorder (HCC)   . Chronic headache   . Depression 03/30/2020  . ETOH abuse 03/30/2020  . 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 Horseshoe kidney   . Marijuana smoker 03/30/2020    Past Surgical History:  Procedure Laterality Date  . TONSILLECTOMY     Family History:  Family History  Problem Relation Age of Onset  . Hypertension Mother   . Diabetes Mother   . Hypertension Father   . Heart failure Father   . Diabetes Father    Family Psychiatric  History:  Recently recorded   Social History:  Social History   Substance and Sexual Activity  Alcohol Use Not Currently  . Alcohol/week: 6.0  standard drinks  . Types: 6 Cans of beer per week     Social History   Substance and Sexual Activity  Drug Use Not Currently  . Types: IV   Comment: heroine    Social History   Socioeconomic History  . Marital status: Single    Spouse name: Not on file  . Number of children: Not on file  . Years of education: Not on file  . Highest education level: Not on file  Occupational History  . Not on file  Tobacco Use  . Smoking status: Current Every Day Smoker    Packs/day: 1.50    Types: Cigarettes  . Smokeless tobacco: Current User    Types: Chew  Vaping Use  . Vaping Use: Never used  Substance and Sexual Activity  . Alcohol use: Not Currently    Alcohol/week: 6.0 standard drinks    Types: 6 Cans of beer per week  . Drug use: Not Currently    Types: IV    Comment: heroine  . Sexual activity: Yes  Other Topics Concern  . Not on file  Social History Narrative  . Not on file   Social Determinants of Health   Financial Resource Strain:   . Difficulty of Paying Living Expenses: Not on file  Food Insecurity:   . Worried About Programme researcher, broadcasting/film/videounning Out of Food in the Last Year: Not on file  . Ran Out of Food  in the Last Year: Not on file  Transportation Needs:   . Lack of Transportation (Medical): Not on file  . Lack of Transportation (Non-Medical): Not on file  Physical Activity:   . Days of Exercise per Week: Not on file  . Minutes of Exercise per Session: Not on file  Stress:   . Feeling of Stress : Not on file  Social Connections:   . Frequency of Communication with Friends and Family: Not on file  . Frequency of Social Gatherings with Friends and Family: Not on file  . Attends Religious Services: Not on file  . Active Member of Clubs or Organizations: Not on file  . Attends Banker Meetings: Not on file  . Marital Status: Not on file   Additional Social History:    Allergies:   Allergies  Allergen Reactions  . Geodon [Ziprasidone Hcl] Other (See Comments)     Seizures  . Onion Anaphylaxis  . Valproic Acid   . Levofloxacin Itching    Labs:  Results for orders placed or performed during the hospital encounter of 08/27/20 (from the past 48 hour(s))  Comprehensive metabolic panel     Status: Abnormal   Collection Time: 08/27/20 12:36 PM  Result Value Ref Range   Sodium 135 135 - 145 mmol/L   Potassium 4.3 3.5 - 5.1 mmol/L   Chloride 101 98 - 111 mmol/L   CO2 24 22 - 32 mmol/L   Glucose, Bld 92 70 - 99 mg/dL    Comment: Glucose reference range applies only to samples taken after fasting for at least 8 hours.   BUN 17 6 - 20 mg/dL   Creatinine, Ser 0.93 0.61 - 1.24 mg/dL   Calcium 9.4 8.9 - 23.5 mg/dL   Total Protein 8.5 (H) 6.5 - 8.1 g/dL   Albumin 4.4 3.5 - 5.0 g/dL   AST 45 (H) 15 - 41 U/L   ALT 47 (H) 0 - 44 U/L   Alkaline Phosphatase 95 38 - 126 U/L   Total Bilirubin 0.9 0.3 - 1.2 mg/dL   GFR calc non Af Amer >60 >60 mL/min   GFR calc Af Amer >60 >60 mL/min   Anion gap 10 5 - 15    Comment: Performed at [facility], [address] [place], [place] [address]  Ethanol     Status: None   Collection Time: 08/27/20 12:36 PM  Result Value Ref Range   Alcohol, Ethyl (B) <10 <10 mg/dL    Comment: (NOTE) Lowest detectable limit for serum alcohol is 10 mg/dL.  For medical purposes only. Performed at [facility], [address] [place], [place] [address]   Salicylate level     Status: Abnormal   Collection Time: 08/27/20 12:36 PM  Result Value Ref Range   Salicylate Lvl <7.0 (L) 7.0 - 30.0 mg/dL    Comment: Performed at [facility], [address] [place], [place] [address]  Acetaminophen level     Status: Abnormal   Collection Time: 08/27/20 12:36 PM  Result Value Ref Range   Acetaminophen (Tylenol), Serum <10 (L) 10 - 30 ug/mL    Comment: (NOTE) Therapeutic concentrations vary significantly. A range of 10-30 ug/mL  may be an effective concentration for many patients. However, some  are  best treated at concentrations outside of this range. Acetaminophen concentrations >150 ug/mL at 4 hours after ingestion  and >50 ug/mL at 12 hours after ingestion are often associated with  toxic reactions.  Performed at  [facility], [address] [place], [place] [address]   cbc     Status: Abnormal   Collection Time: 08/27/20 12:36 PM  Result Value Ref Range   WBC 10.6 (H) 4.0 - 10.5 K/uL   RBC 4.89 4.22 - 5.81 MIL/uL   Hemoglobin 16.0 13.0 - 17.0 g/dL   HCT 11.0 39 - 52 %   MCV 95.5 80.0 - 100.0 fL   MCH 32.7 26.0 - 34.0 pg   MCHC 34.3 30.0 - 36.0 g/dL   RDW 31.5 94.5 - 85.9 %   Platelets 269 150 - 400 K/uL   nRBC 0.0 0.0 - 0.2 %    Comment: Performed at [facility], [address] [place], [place] [address]  Urine Drug Screen, Qualitative     Status: Abnormal   Collection Time: 08/27/20 12:39 PM  Result Value Ref Range   Tricyclic, Ur Screen NONE DETECTED NONE DETECTED   Amphetamines, Ur Screen NONE DETECTED NONE DETECTED   MDMA (Ecstasy)Ur Screen NONE DETECTED NONE DETECTED   Cocaine Metabolite,Ur [place] NONE DETECTED NONE DETECTED   Opiate, Ur Screen NONE DETECTED NONE DETECTED   Phencyclidine (PCP) Ur S NONE DETECTED NONE DETECTED   Cannabinoid 50 Ng, Ur [place] POSITIVE (A) NONE DETECTED   Barbiturates, Ur Screen NONE DETECTED NONE DETECTED   Benzodiazepine, Ur Scrn NONE DETECTED NONE DETECTED   Methadone Scn, Ur NONE DETECTED NONE DETECTED    Comment: (NOTE) Tricyclics + metabolites, urine    Cutoff 1000 ng/mL Amphetamines + metabolites, urine  Cutoff 1000 ng/mL MDMA (Ecstasy), urine              Cutoff 500 ng/mL Cocaine Metabolite, urine          Cutoff 300 ng/mL Opiate + metabolites, urine        Cutoff 300 ng/mL Phencyclidine (PCP), urine         Cutoff 25 ng/mL Cannabinoid, urine                 Cutoff 50 ng/mL Barbiturates + metabolites, urine  Cutoff 200 ng/mL Benzodiazepine, urine              Cutoff 200 ng/mL Methadone, urine                    Cutoff 300 ng/mL  The urine drug screen provides only a preliminary, unconfirmed analytical test result and should not be used for non-medical purposes. Clinical consideration and professional judgment should be applied to any positive drug screen result due to possible interfering substances. A more specific alternate chemical method must be used in order to obtain a confirmed analytical result. Gas chromatography / mass spectrometry (GC/MS) is the preferred confirm atory method. Performed at [facility], [address] [place], [place] [address]   Lipase, blood     Status: None   Collection Time: 08/27/20  1:15 PM  Result Value Ref Range   Lipase 29 11 - 51 U/L    Comment: Performed at [facility], [address] [place], [place] [address]  Acetaminophen level     Status: Abnormal   Collection Time: 08/27/20  2:32 PM  Result Value Ref Range   Acetaminophen (Tylenol), Serum <10 (L) 10 - 30 ug/mL    Comment: (NOTE) Therapeutic concentrations vary significantly. A range of 10-30 ug/mL  may be an effective concentration for many patients. However, some  are best treated at concentrations outside of this range. Acetaminophen concentrations >150 ug/mL  at 4 hours after ingestion  and >50 ug/mL at 12 hours after ingestion are often associated with  toxic reactions.  Performed at [facility], [address] [place], [place] [address]   Acetaminophen level     Status: Abnormal   Collection Time: 08/27/20  5:28 PM  Result Value Ref Range   Acetaminophen (Tylenol), Serum <10 (L) 10 - 30 ug/mL    Comment: (NOTE) Therapeutic concentrations vary significantly. A range of 10-30 ug/mL  may be an effective concentration for many patients. However, some  are best treated at concentrations outside of this range. Acetaminophen concentrations >150 ug/mL at 4 hours after ingestion  and >50 ug/mL at 12 hours after ingestion are often associated with   toxic reactions.  Performed at [facility], [address] [place], [place] [address]     No current facility-administered medications for this encounter.   No current outpatient medications on file.    Musculoskeletal: Strength & Muscle Tone: normal  Gait & Station: normal  Patient leans: n/a   Psychiatric Specialty Exam: Physical Exam  Review of Systems  Blood pressure 113/77, pulse 74, temperature 98.4 F (36.9 C), resp. rate 20, height 5\' 9"  (1.753 m), weight 62.6 kg, SpO2 94 %.Body mass index is 20.38 kg/m.    Mental Status Appearance,  --unkept forlorn haggard thin  Strange and odd complains of RUQ pain being evaluated Post tylenol OD  Oriented times four Rapport and eye contact poor Many "IDK" answers  Vague paucity of explanations Mood depressed affect constricted anxious Focused on pain Consciousness not clouded or fluctuant Concentration and attention poor Cannot assess memory not cooperative Speech low tone volume  Fluency okay Movements no shakes tremors tics SI active with attempt HI none Abstraction poor Judgement insight reliability  Intelligence fund of knowledge all poor Thought process and content disorganized illogical  Somewhat fearful, no frank psychosis or mania                                                     Recall poor Cognition poor Akathisia none Handedness not known Aims not done  ADL's poor Sleep erratic and poor Language English   Vague today Assets not known at least sought help Psychomotor up and down    Treatment Plan Summary:  Post med clearance at higher risk of SI with attempt  Needs Psych admission here or to refer out   ON IVC   Disposition:  Needs admission  Psych meds started for depression, illogical disorganized though t  , MD 08/27/2020 7:05 PM

## 2020-08-27 NOTE — ED Notes (Signed)
Pt  PLACED  UNDER  IVC PAPERS  INFORMED  GREG  CHARGE NURSE  AND  STEPHAINE  RN

## 2020-08-27 NOTE — ED Provider Notes (Signed)
[facility] Emergency Department Provider Note  ____________________________________________  Time seen: Approximately 1:27 PM  I have reviewed the triage vital signs and the nursing notes.   HISTORY  Chief Complaint Drug Overdose    HPI Nathan [name] is a 34 y.o. male who reports taking about 30 pills of Tylenol at about 12:00 PM today.  He reports that he has been feeling suicidal for a while, and today he left work and just "snapped" and took the Tylenol.  There were no new stressors or changes in his life to provoke this other than his baseline chronic depression.  He complains of right upper quadrant abdominal pain that is started within the last 30 minutes.  Has nausea and reports vomiting once at home which looked like "paste."  Denies any prior Tylenol ingestion or other drug ingestion or aspirin or NSAID overuse.   Pain is nonradiating, constant, severe, no aggravating or alleviating factors.     Past Medical History:  Diagnosis Date  . Bipolar 1 disorder (HCC)   . Chronic headache   . Depression 03/30/2020  . ETOH abuse 03/30/2020  . 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 Horseshoe kidney   . Marijuana smoker 03/30/2020     Patient Active Problem List   Diagnosis Date Noted  . Schizoaffective disorder (HCC) 07/26/2020  . Chronic headache 06/26/2020  . Encounter to establish care 06/26/2020  . History of gastroesophageal reflux (GERD) 06/26/2020  . Non-suicidal self harm 04/05/2020  . Bipolar 1 disorder, depressed (HCC) 01/31/2018  . Bipolar 1 disorder, mixed, severe (HCC) 04/22/2016  . Alcohol abuse 04/22/2016  . Suicidal ideation 04/22/2016  . Tobacco use disorder 04/22/2016  . 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 TONSILLECTOMY       Prior to Admission medications   Not on File     Allergies Geodon [ziprasidone hcl], Onion, Valproic  acid, and Levofloxacin   Family History  Problem Relation Age of Onset  . Hypertension Mother   . Diabetes Mother   . Hypertension Father   . Heart failure Father   . Diabetes Father     Social History Social History   Tobacco Use  . Smoking status: Current Every Day Smoker    Packs/day: 1.50    Types: Cigarettes  . Smokeless tobacco: Current User    Types: Chew  Vaping Use  . Vaping Use: Never used  Substance Use Topics  . Alcohol use: Not Currently    Alcohol/week: 6.0 standard drinks    Types: 6 Cans of beer per week  . Drug use: Not Currently    Types: IV    Comment: heroine    Review of Systems  Constitutional:   No fever or chills.  ENT:   No sore throat. No rhinorrhea. Cardiovascular:   No chest pain or syncope. Respiratory:   No dyspnea or cough. Gastrointestinal:   Positive as above right upper quadrant pain and vomiting Musculoskeletal:   Negative for focal pain or swelling All other systems reviewed and are negative except as documented above in ROS and HPI.  ____________________________________________   PHYSICAL EXAM:  VITAL SIGNS: ED Triage Vitals  Enc Vitals Group     BP 08/27/20 1240 (!) 130/93     Pulse Rate 08/27/20 1240 98     Resp 08/27/20 1240 (!) 24     Temp 08/27/20 1240 98.4 F (36.9 C)  Temp src --      SpO2 08/27/20 1240 98 %     Weight 08/27/20 1236 138 lb (62.6 kg)     Height 08/27/20 1236 5\' 9"  (1.753 m)     Head Circumference --      Peak Flow --      Pain Score 08/27/20 1236 8     Pain Loc --      Pain Edu? --      Excl. in GC? --     Vital signs reviewed, nursing assessments reviewed.   Constitutional:   Alert and oriented. Non-toxic appearance. Eyes:   Conjunctivae are normal. EOMI. PERRL. ENT      Head:   Normocephalic and atraumatic.      Nose:   Wearing a mask.      Mouth/Throat:   Wearing a mask.      Neck:   No meningismus. Full ROM. Hematological/Lymphatic/Immunilogical:   No cervical  lymphadenopathy. Cardiovascular:   RRR. Symmetric bilateral radial and DP pulses.  No murmurs. Cap refill less than 2 seconds. Respiratory:   Normal respiratory effort without tachypnea/retractions. Breath sounds are clear and equal bilaterally. No wheezes/rales/rhonchi. Gastrointestinal:   Soft and nontender. Non distended. There is no CVA tenderness.  No rebound, rigidity, or guarding.  Musculoskeletal:   Normal range of motion in all extremities. No joint effusions.  No lower extremity tenderness.  No edema. Neurologic:   Normal speech and language.  Motor grossly intact. No acute focal neurologic deficits are appreciated.  Skin:    Skin is warm, dry and intact. No rash noted.  No petechiae, purpura, or bullae.  ____________________________________________    LABS (pertinent positives/negatives) (all labs ordered are listed, but only abnormal results are displayed) Labs Reviewed  COMPREHENSIVE METABOLIC PANEL - Abnormal; Notable for the following components:      Result Value   Total Protein 8.5 (*)    AST 45 (*)    ALT 47 (*)    All other components within normal limits  SALICYLATE LEVEL - Abnormal; Notable for the following components:   Salicylate Lvl <7.0 (*)    All other components within normal limits  ACETAMINOPHEN LEVEL - Abnormal; Notable for the following components:   Acetaminophen (Tylenol), Serum <10 (*)    All other components within normal limits  CBC - Abnormal; Notable for the following components:   WBC 10.6 (*)    All other components within normal limits  URINE DRUG SCREEN, QUALITATIVE ([facility] ONLY) - Abnormal; Notable for the following components:   Cannabinoid 50 Ng, Ur [place] POSITIVE (*)    All other components within normal limits  ETHANOL   ____________________________________________   EKG  Interpreted by me Normal sinus rhythm rate of 92, normal axis and intervals.  Poor R wave progression.  Normal ST segments and T  waves.  ____________________________________________    RADIOLOGY  No results found.  ____________________________________________   PROCEDURES Procedures  ____________________________________________    CLINICAL IMPRESSION / ASSESSMENT AND PLAN / ED COURSE  Medications ordered in the ED: Medications  sodium chloride 0.9 % bolus 1,000 mL (1,000 mLs Intravenous New Bag/Given 08/27/20 1254)  ondansetron (ZOFRAN) injection 4 mg (4 mg Intravenous Given 08/27/20 1254)  charcoal activated (NO SORBITOL) (ACTIDOSE-AQUA) suspension 50 g (50 g Oral Given 08/27/20 1255)    Pertinent labs & imaging results that were available during my care of the patient were reviewed by me and considered in my medical decision making (see chart for details).  10/27/20  [name] was evaluated in Emergency Department on 08/27/2020 for the symptoms described in the history of present illness. He was evaluated in the context of the global COVID-19 pandemic, which necessitated consideration that the patient might be at risk for infection with the SARS-CoV-2 virus that causes COVID-19. Institutional protocols and algorithms that pertain to the evaluation of patients at risk for COVID-19 are in a state of rapid change based on information released by regulatory bodies including the CDC and federal and state organizations. These policies and algorithms were followed during the patient's care in the ED.   Patient reports a recent Tylenol ingestion and suicide attempt.  His reported right upper quadrant abdominal pain does not make sense with the time course of ingestion, but will check labs, IVC for safety pending psychiatry evaluation and lab results, give charcoal which patient is amenable to.  IV fluids and Zofran for hydration and symptom relief.   The patient has been placed in psychiatric observation due to the need to provide a safe environment for the patient while obtaining psychiatric consultation and evaluation,  as well as ongoing medical and medication management to treat the patient's condition.  The patient has been placed under full IVC at this time.     ----------------------------------------- 1:31 PM on 08/27/2020 ----------------------------------------- Initial lab panel is normal including LFTs, Tylenol level, ethanol level.  I will add on lipase.  Will need repeat 4-hour Tylenol level.  If negative, presentation may be factitious.       ____________________________________________   FINAL CLINICAL IMPRESSION(S) / ED DIAGNOSES    Final diagnoses:  Intentional drug overdose, initial encounter [facility])     ED Discharge Orders    None      Portions of this note were generated with dragon dictation software. Dictation errors may occur despite best attempts at proofreading.   Sharman Cheek, MD 08/27/20 1331

## 2020-08-27 NOTE — ED Notes (Signed)
Pt dressed out in psych clothing:  1 gray hat 1 olive shirt 1 pair of denim shorts 1 pair of black shorts  1 pair of black boxers 1 pair of socks 1 pair of black sneakers  Bags labeled appropriately.

## 2020-08-28 ENCOUNTER — Inpatient Hospital Stay (HOSPITAL_COMMUNITY)
Admission: AD | Admit: 2020-08-28 | Discharge: 2020-08-31 | DRG: 885 | Disposition: A | Discharge status: Home / Self Care | Payer: No Typology Code available for payment source | Attending: Psychiatry | Admitting: Psychiatry

## 2020-08-28 ENCOUNTER — Encounter (HOSPITAL_COMMUNITY): Payer: Self-pay | Admitting: Psychiatry

## 2020-08-28 ENCOUNTER — Other Ambulatory Visit: Payer: Self-pay

## 2020-08-28 DIAGNOSIS — F3163 Bipolar disorder, current episode mixed, severe, without psychotic features: Principal | ICD-10-CM | POA: Diagnosis present

## 2020-08-28 DIAGNOSIS — R45851 Suicidal ideations: Secondary | ICD-10-CM

## 2020-08-28 DIAGNOSIS — F1020 Alcohol dependence, uncomplicated: Secondary | ICD-10-CM | POA: Diagnosis present

## 2020-08-28 DIAGNOSIS — R45850 Homicidal ideations: Secondary | ICD-10-CM | POA: Diagnosis present

## 2020-08-28 DIAGNOSIS — T50902A Poisoning by unspecified drugs, medicaments and biological substances, intentional self-harm, initial encounter: Secondary | ICD-10-CM | POA: Diagnosis present

## 2020-08-28 DIAGNOSIS — F419 Anxiety disorder, unspecified: Secondary | ICD-10-CM | POA: Diagnosis present

## 2020-08-28 DIAGNOSIS — F1220 Cannabis dependence, uncomplicated: Secondary | ICD-10-CM

## 2020-08-28 DIAGNOSIS — Z8719 Personal history of other diseases of the digestive system: Secondary | ICD-10-CM

## 2020-08-28 DIAGNOSIS — F1024 Alcohol dependence with alcohol-induced mood disorder: Secondary | ICD-10-CM | POA: Diagnosis not present

## 2020-08-28 DIAGNOSIS — F17210 Nicotine dependence, cigarettes, uncomplicated: Secondary | ICD-10-CM | POA: Diagnosis present

## 2020-08-28 DIAGNOSIS — R1011 Right upper quadrant pain: Secondary | ICD-10-CM | POA: Diagnosis not present

## 2020-08-28 DIAGNOSIS — G4700 Insomnia, unspecified: Secondary | ICD-10-CM | POA: Diagnosis present

## 2020-08-28 DIAGNOSIS — R109 Unspecified abdominal pain: Secondary | ICD-10-CM

## 2020-08-28 DIAGNOSIS — F319 Bipolar disorder, unspecified: Secondary | ICD-10-CM

## 2020-08-28 DIAGNOSIS — K219 Gastro-esophageal reflux disease without esophagitis: Secondary | ICD-10-CM | POA: Diagnosis present

## 2020-08-28 DIAGNOSIS — Z590 Homelessness: Secondary | ICD-10-CM | POA: Diagnosis not present

## 2020-08-28 DIAGNOSIS — F19980 Other psychoactive substance use, unspecified with psychoactive substance-induced anxiety disorder: Secondary | ICD-10-CM | POA: Diagnosis present

## 2020-08-28 DIAGNOSIS — F17200 Nicotine dependence, unspecified, uncomplicated: Secondary | ICD-10-CM | POA: Diagnosis not present

## 2020-08-28 DIAGNOSIS — Z20822 Contact with and (suspected) exposure to covid-19: Secondary | ICD-10-CM | POA: Diagnosis present

## 2020-08-28 DIAGNOSIS — F602 Antisocial personality disorder: Secondary | ICD-10-CM | POA: Diagnosis present

## 2020-08-28 HISTORY — DX: Anxiety disorder, unspecified: F41.9

## 2020-08-28 LAB — SARS CORONAVIRUS 2 BY RT PCR (HOSPITAL ORDER, PERFORMED IN ~~LOC~~ HOSPITAL LAB): SARS Coronavirus 2: NEGATIVE

## 2020-08-28 MED ORDER — ACETAMINOPHEN 325 MG PO TABS
650.0000 mg | ORAL_TABLET | Freq: Four times a day (QID) | ORAL | Status: DC | PRN
  Administered 2020-08-30: 650 mg via ORAL
  Filled 2020-08-28: qty 2

## 2020-08-28 MED ORDER — RISPERIDONE 2 MG PO TBDP
2.0000 mg | ORAL_TABLET | Freq: Every day | ORAL | Status: DC
  Administered 2020-08-28: 2 mg via ORAL
  Filled 2020-08-28 (×4): qty 1

## 2020-08-28 MED ORDER — OLANZAPINE 10 MG IM SOLR: 10.0000 mg | Freq: Four times a day (QID) | INTRAMUSCULAR | Status: DC | PRN

## 2020-08-28 MED ORDER — OLANZAPINE 10 MG PO TBDP: 10.0000 mg | ORAL_TABLET | Freq: Three times a day (TID) | ORAL | Status: DC | PRN

## 2020-08-28 MED ORDER — TRAZODONE HCL 50 MG PO TABS
50.0000 mg | ORAL_TABLET | Freq: Every evening | ORAL | Status: DC | PRN
  Filled 2020-08-28: qty 1

## 2020-08-28 MED ORDER — PANTOPRAZOLE SODIUM 40 MG PO TBEC
40.0000 mg | DELAYED_RELEASE_TABLET | Freq: Every day | ORAL | Status: DC
  Administered 2020-08-29 – 2020-08-31 (×3): 40 mg via ORAL
  Filled 2020-08-28 (×8): qty 1

## 2020-08-28 MED ORDER — ALUM & MAG HYDROXIDE-SIMETH 200-200-20 MG/5ML PO SUSP: 30.0000 mL | ORAL | Status: DC | PRN

## 2020-08-28 MED ORDER — MAGNESIUM HYDROXIDE 400 MG/5ML PO SUSP: 30.0000 mL | Freq: Every day | ORAL | Status: DC | PRN

## 2020-08-28 MED ORDER — ZIPRASIDONE MESYLATE 20 MG IM SOLR: 20.0000 mg | INTRAMUSCULAR | Status: DC | PRN

## 2020-08-28 MED ORDER — HYDROXYZINE HCL 25 MG PO TABS
25.0000 mg | ORAL_TABLET | Freq: Three times a day (TID) | ORAL | Status: DC | PRN
  Administered 2020-08-28: 25 mg via ORAL
  Filled 2020-08-28: qty 1
  Filled 2020-08-28: qty 10

## 2020-08-28 MED ORDER — NICOTINE POLACRILEX 2 MG MT GUM
2.0000 mg | CHEWING_GUM | OROMUCOSAL | Status: DC | PRN
  Administered 2020-08-28 – 2020-08-31 (×6): 2 mg via ORAL
  Filled 2020-08-28 (×3): qty 1
  Filled 2020-08-28: qty 9
  Filled 2020-08-28 (×2): qty 1

## 2020-08-28 MED ORDER — LORAZEPAM 1 MG PO TABS: 1.0000 mg | ORAL_TABLET | ORAL | Status: DC | PRN

## 2020-08-28 NOTE — ED Notes (Addendum)
Assumed care of patient. Patient SI with a sitter at bedside. Patient was sleeping when writer went in to room. Patient has been accepted to [facility], report to be called. Will be transferred after 10:00 this morning. Safety maintained.

## 2020-08-28 NOTE — ED Notes (Signed)
Pt is A&Ox4, calm and cooperative, states feeling depressed still but better after speaking with psychiatry and denies SI or HI or acting on any plans and verbalized this to this RN.  Will continue to round and monitor closely with patient.

## 2020-08-28 NOTE — H&P (Signed)
Psychiatric Admission Assessment Adult  Patient Identification: Nathan [name] MRN:  [id number] Date of Evaluation:  08/28/2020 Chief Complaint:  Intentional overdose of drug in tablet form (HCC) [T50.902A] Principal Diagnosis: <principal problem not specified> Diagnosis:  Active Problems:   Intentional overdose of drug in tablet form (HCC)  History of Present Illness: Patient is seen and examined.  Patient is a 34 year old male with a past psychiatric history significant for alcohol dependence who originally presented to the [facility] emergency department on 08/25/2020 with suicidal ideation.  He reported a history of bipolar disorder, alcohol abuse, polysubstance abuse and hepatitis C who presented to the emergency room for evaluation.  The patient stated "I have been doing so well then I messed up".  He stated he had been drinking the evening of admission was having thoughts of killing himself.  He would not elaborate on any plan.  He admitted to marijuana use but denied any other drug use.  His medications on admission at that time were ibuprofen and omeprazole.  His laboratories at that time showed a normal AST of 45 and an ALT of 48.  His acetaminophen level was less than 10.  His blood alcohol at that time was 203.  Drug screen was positive for cannabinoids.  Nursing notes on 08/25/2020 showed that he denied suicidal ideation.  He was having homicidal ideation but would not tell the writer who he was having these thoughts about.  He denied any auditory or visual hallucinations.  Somehow or another there was some admission of suicidal ideation, and he was placed under involuntary commitment by the emergency room doctor.  He had a behavioral health assessment and that he apparently additional information showed that the police had brought him in.  He was cursing in triage.  He was aggressive in triage.  He was taken in by officers.  The patient admitted at that time that he had  become upset after his uncle kicked him out of the home for no reason.  He had been quoted as saying I have been doing everything I am supposed to do in got put out on the streets for no reason".  He reported being homeless and in need of a place to go.  He admitted to homicidal ideation towards his uncle, but denied any current homicidal ideation.  He had recently started a job at [employer].  He stated today he had been working there for 2 weeks.  He stated he had tried local programs like [facility] and [facility] but they made him "worse not better".  He has had multiple diagnoses in the past including alcohol dependence, depression and personality disorder.  He stated today that he taken medications all the way from "Prozac to Zoloft".  Later on 08/26/2020 the patient wanted to go home post intoxication.  He contracted for safety and was not actively suicidal or homicidal.  He was discharged home.  On 08/27/2020 he returned to the emergency room and stated that he had overdosed on 30 Tylenol.  He was complaining of right upper quadrant pain, nausea and fatigue.  He stated that that time he was suicidal and it was a suicide attempt.  On 9/8 his AST was 45 and ALT was 47.  His acetaminophen level was less than 10.  He was seen again by psychiatry and stated that he had been overdosed on Tylenol.  His evaluation stated that he was vague on motives, but felt depressed, hopeless, helpless and his lack of energy.  He was transferred to our facility for evaluation and stabilization.  His main complaint today is right upper quadrant pain.  He stated he needed to get some rest.  He was irritable.  Associated Signs/Symptoms: Depression Symptoms:  anhedonia, insomnia, fatigue, suicidal thoughts with specific plan, disturbed sleep, (Hypo) Manic Symptoms:  Impulsivity, Irritable Mood, Labiality of Mood, Anxiety Symptoms:  Excessive Worry, Psychotic Symptoms:  denied PTSD Symptoms: Negative Total Time spent with patient: 30  minutes  Past Psychiatric History: Patient has a longstanding history of alcohol dependence as well as antisocial personality disorder.  It looks as though he has had 1 or 2 psychiatric admissions at [facility] in the past.  His most recent rehab facility stay was within the last 4 weeks or so.  He felt as though going to rehab was "made me worse not better".  He states he has been treated for "bipolar disorder".  He stated he has been treated with "everything from Prozac to Zoloft".  Review of the electronic medical record revealed no psychiatric medications from his multiple ER visits at [facility].  Is the patient at risk to self? No.  Has the patient been a risk to self in the past 6 months? No.  Has the patient been a risk to self within the distant past? No.  Is the patient a risk to others? Yes.    Has the patient been a risk to others in the past 6 months? Yes.    Has the patient been a risk to others within the distant past? Yes.     Prior Inpatient Therapy:   Prior Outpatient Therapy:    Alcohol Screening: 1. How often do you have a drink containing alcohol?: 4 or more times a week 2. How many drinks containing alcohol do you have on a typical day when you are drinking?: 5 or 6 3. How often do you have six or more drinks on one occasion?: Daily or almost daily AUDIT-C Score: 10 4. How often during the last year have you found that you were not able to stop drinking once you had started?: Weekly 5. How often during the last year have you failed to do what was normally expected from you because of drinking?: Weekly 6. How often during the last year have you needed a first drink in the morning to get yourself going after a heavy drinking session?: Daily or almost daily 7. How often during the last year have you had a feeling of guilt of remorse after drinking?: Never 8. How often during the last year have you been unable to remember what happened the night before  because you had been drinking?: Never 9. Have you or someone else been injured as a result of your drinking?: No 10. Has a relative or friend or a doctor or another health worker been concerned about your drinking or suggested you cut down?: No Alcohol Use Disorder Identification Test Final Score (AUDIT): 20 Alcohol Brief Interventions/Follow-up: Alcohol Education Substance Abuse History in the last 12 months:  Yes.   Consequences of Substance Abuse: Medical Consequences:  Hepatitis C Previous Psychotropic Medications: Yes  Psychological Evaluations: Yes  Past Medical History:  Past Medical History:  Diagnosis Date  . Anxiety   . Bipolar 1 disorder (HCC)   . Chronic headache   . Depression 03/30/2020  . ETOH abuse 03/30/2020  . 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 Horseshoe kidney   . Marijuana smoker 03/30/2020    Past  Surgical History:  Procedure Laterality Date  . TONSILLECTOMY     Family History:  Family History  Problem Relation Age of Onset  . Hypertension Mother   . Diabetes Mother   . Hypertension Father   . Heart failure Father   . Diabetes Father    Family Psychiatric  History: Denied Tobacco Screening: Have you used any form of tobacco in the last 30 days? (Cigarettes, Smokeless Tobacco, Cigars, and/or Pipes): Yes Tobacco use, Select all that apply: 5 or more cigarettes per day Are you interested in Tobacco Cessation Medications?: Yes, will notify MD for an order Counseled patient on smoking cessation including recognizing danger situations, developing coping skills and basic information about quitting provided: Yes Social History:  Social History   Substance and Sexual Activity  Alcohol Use Yes  . Alcohol/week: 24.0 standard drinks  . Types: 24 Cans of beer per week   Comment: 24 beers weekly     Social History   Substance and Sexual Activity  Drug Use Yes  . Types: IV, Marijuana   Comment: 2-3 joints weekly    Additional Social History:      Pain  Medications: see MAR Prescriptions: see MAR Over the Counter: see MAR History of alcohol / drug use?: Yes Longest period of sobriety (when/how long): unsure Negative Consequences of Use: Financial, Work / School Withdrawal Symptoms: Agitation, Irritability Name of Substance 1: alcohol 1 - Age of First Use: 34 yrs old Name of Substance 2: THC                Allergies:   Allergies  Allergen Reactions  . Geodon [Ziprasidone Hcl] Other (See Comments)    Seizures  . Onion Anaphylaxis  . Valproic Acid   . Levofloxacin Itching   Lab Results:  Results for orders placed or performed during the hospital encounter of 08/27/20 (from the past 48 hour(s))  Comprehensive metabolic panel     Status: Abnormal   Collection Time: 08/27/20 12:36 PM  Result Value Ref Range   Sodium 135 135 - 145 mmol/L   Potassium 4.3 3.5 - 5.1 mmol/L   Chloride 101 98 - 111 mmol/L   CO2 24 22 - 32 mmol/L   Glucose, Bld 92 70 - 99 mg/dL    Comment: Glucose reference range applies only to samples taken after fasting for at least 8 hours.   BUN 17 6 - 20 mg/dL   Creatinine, Ser 3.660.62 0.61 - 1.24 mg/dL   Calcium 9.4 8.9 - 44.010.3 mg/dL   Total Protein 8.5 (H) 6.5 - 8.1 g/dL   Albumin 4.4 3.5 - 5.0 g/dL   AST 45 (H) 15 - 41 U/L   ALT 47 (H) 0 - 44 U/L   Alkaline Phosphatase 95 38 - 126 U/L   Total Bilirubin 0.9 0.3 - 1.2 mg/dL   GFR calc non Af Amer >60 >60 mL/min   GFR calc Af Amer >60 >60 mL/min   Anion gap 10 5 - 15    Comment: Performed at [facility], [address] [place], [place] [address]  Ethanol     Status: None   Collection Time: 08/27/20 12:36 PM  Result Value Ref Range   Alcohol, Ethyl (B) <10 <10 mg/dL    Comment: (NOTE) Lowest detectable limit for serum alcohol is 10 mg/dL.  For medical purposes only. Performed at [facility], [address] [place], [place] [address]   Salicylate level     Status: Abnormal   Collection  Time: 08/27/20 12:36 PM  Result Value  Ref Range   Salicylate Lvl <7.0 (L) 7.0 - 30.0 mg/dL    Comment: Performed at [facility], [address] [place], [place] [address]  Acetaminophen level     Status: Abnormal   Collection Time: 08/27/20 12:36 PM  Result Value Ref Range   Acetaminophen (Tylenol), Serum <10 (L) 10 - 30 ug/mL    Comment: (NOTE) Therapeutic concentrations vary significantly. A range of 10-30 ug/mL  may be an effective concentration for many patients. However, some  are best treated at concentrations outside of this range. Acetaminophen concentrations >150 ug/mL at 4 hours after ingestion  and >50 ug/mL at 12 hours after ingestion are often associated with  toxic reactions.  Performed at [facility], [address] [place], [place] [address]   cbc     Status: Abnormal   Collection Time: 08/27/20 12:36 PM  Result Value Ref Range   WBC 10.6 (H) 4.0 - 10.5 K/uL   RBC 4.89 4.22 - 5.81 MIL/uL   Hemoglobin 16.0 13.0 - 17.0 g/dL   HCT 99.2 39 - 52 %   MCV 95.5 80.0 - 100.0 fL   MCH 32.7 26.0 - 34.0 pg   MCHC 34.3 30.0 - 36.0 g/dL   RDW 42.6 83.4 - 19.6 %   Platelets 269 150 - 400 K/uL   nRBC 0.0 0.0 - 0.2 %    Comment: Performed at [facility], [address], [place], [place] [address]  Urine Drug Screen, Qualitative     Status: Abnormal   Collection Time: 08/27/20 12:39 PM  Result Value Ref Range   Tricyclic, Ur Screen NONE DETECTED NONE DETECTED   Amphetamines, Ur Screen NONE DETECTED NONE DETECTED   MDMA (Ecstasy)Ur Screen NONE DETECTED NONE DETECTED   Cocaine Metabolite,Ur [place] NONE DETECTED NONE DETECTED   Opiate, Ur Screen NONE DETECTED NONE DETECTED   Phencyclidine (PCP) Ur S NONE DETECTED NONE DETECTED   Cannabinoid 50 Ng, Ur [place] POSITIVE (A) NONE DETECTED   Barbiturates, Ur Screen NONE DETECTED NONE DETECTED   Benzodiazepine, Ur Scrn NONE DETECTED NONE DETECTED   Methadone Scn, Ur NONE DETECTED NONE DETECTED    Comment: (NOTE) Tricyclics + metabolites, urine     Cutoff 1000 ng/mL Amphetamines + metabolites, urine  Cutoff 1000 ng/mL MDMA (Ecstasy), urine              Cutoff 500 ng/mL Cocaine Metabolite, urine          Cutoff 300 ng/mL Opiate + metabolites, urine        Cutoff 300 ng/mL Phencyclidine (PCP), urine         Cutoff 25 ng/mL Cannabinoid, urine                 Cutoff 50 ng/mL Barbiturates + metabolites, urine  Cutoff 200 ng/mL Benzodiazepine, urine              Cutoff 200 ng/mL Methadone, urine                   Cutoff 300 ng/mL  The urine drug screen provides only a preliminary, unconfirmed analytical test result and should not be used for non-medical purposes. Clinical consideration and professional judgment should be applied to any positive drug screen result due to possible interfering substances. A more specific alternate chemical method must be used in order to obtain a confirmed analytical result. Gas chromatography / mass spectrometry (GC/MS) is the preferred confirm atory method.  Performed at [facility], [address] [place], [place] [address]   Lipase, blood     Status: None   Collection Time: 08/27/20  1:15 PM  Result Value Ref Range   Lipase 29 11 - 51 U/L    Comment: Performed at [facility], [address] [place], [place] [address]  Acetaminophen level     Status: Abnormal   Collection Time: 08/27/20  2:32 PM  Result Value Ref Range   Acetaminophen (Tylenol), Serum <10 (L) 10 - 30 ug/mL    Comment: (NOTE) Therapeutic concentrations vary significantly. A range of 10-30 ug/mL  may be an effective concentration for many patients. However, some  are best treated at concentrations outside of this range. Acetaminophen concentrations >150 ug/mL at 4 hours after ingestion  and >50 ug/mL at 12 hours after ingestion are often associated with  toxic reactions.  Performed at [facility], [address] [place], [place] [address]   Acetaminophen level     Status: Abnormal    Collection Time: 08/27/20  5:28 PM  Result Value Ref Range   Acetaminophen (Tylenol), Serum <10 (L) 10 - 30 ug/mL    Comment: (NOTE) Therapeutic concentrations vary significantly. A range of 10-30 ug/mL  may be an effective concentration for many patients. However, some  are best treated at concentrations outside of this range. Acetaminophen concentrations >150 ug/mL at 4 hours after ingestion  and >50 ug/mL at 12 hours after ingestion are often associated with  toxic reactions.  Performed at [facility], [address] [place], [place] [address]   SARS Coronavirus 2 by RT PCR (hospital order, performed in [facility] hospital lab) Nasopharyngeal Nasopharyngeal Swab     Status: None   Collection Time: 08/28/20  4:45 AM   Specimen: Nasopharyngeal Swab  Result Value Ref Range   SARS Coronavirus 2 NEGATIVE NEGATIVE    Comment: (NOTE) SARS-CoV-2 target nucleic acids are NOT DETECTED.  The SARS-CoV-2 RNA is generally detectable in upper and lower respiratory specimens during the acute phase of infection. The lowest concentration of SARS-CoV-2 viral copies this assay can detect is 250 copies / mL. A negative result does not preclude SARS-CoV-2 infection and should not be used as the sole basis for treatment or other patient management decisions.  A negative result may occur with improper specimen collection / handling, submission of specimen other than nasopharyngeal swab, presence of viral mutation(s) within the areas targeted by this assay, and inadequate number of viral copies (<250 copies / mL). A negative result must be combined with clinical observations, patient history, and epidemiological information.  Fact Sheet for Patients:   BoilerBrush.com.cy  Fact Sheet for Healthcare Providers: https://pope.com/  This test is not yet approved or  cleared by the [place] FDA and has been authorized for detection and/or  diagnosis of SARS-CoV-2 by FDA under an Emergency Use Authorization (EUA).  This EUA will remain in effect (meaning this test can be used) for the duration of the COVID-19 declaration under Section 564(b)(1) of the Act, 21 U.S.C. section 360bbb-3(b)(1), unless the authorization is terminated or revoked sooner.  Performed at [facility], [address], [place], [place] [address]     Blood Alcohol level:  Lab Results  Component Value Date   [facility] <10 08/27/2020   ETH 203 (H) 08/25/2020    Metabolic Disorder Labs:  Lab Results  Component Value Date   HGBA1C 5.0 04/23/2016   Lab Results  Component Value Date  PROLACTIN 7.0 04/23/2016   Lab Results  Component Value Date   CHOL 193 04/23/2016   TRIG 94 04/23/2016   HDL 69 04/23/2016   CHOLHDL 2.8 04/23/2016   VLDL 19 04/23/2016   LDLCALC 105 (H) 04/23/2016    Current Medications: Current Facility-Administered Medications  Medication Dose Route Frequency Provider Last Rate Last Admin  . acetaminophen (TYLENOL) tablet 650 mg  650 mg Oral Q6H PRN Antonieta Pert, MD      . alum & mag hydroxide-simeth (MAALOX/MYLANTA) 200-200-20 MG/5ML suspension 30 mL  30 mL Oral Q4H PRN Antonieta Pert, MD      . hydrOXYzine (ATARAX/VISTARIL) tablet 25 mg  25 mg Oral TID PRN Antonieta Pert, MD      . OLANZapine zydis (ZYPREXA) disintegrating tablet 10 mg  10 mg Oral Q8H PRN Antonieta Pert, MD       And  . LORazepam (ATIVAN) tablet 1 mg  1 mg Oral PRN Antonieta Pert, MD      . magnesium hydroxide (MILK OF MAGNESIA) suspension 30 mL  30 mL Oral Daily PRN Antonieta Pert, MD      . OLANZapine [facility]) injection 10 mg  10 mg Intramuscular Q6H PRN Antonieta Pert, MD      . pantoprazole (PROTONIX) EC tablet 40 mg  40 mg Oral Daily Antonieta Pert, MD      . risperiDONE (RISPERDAL M-TABS) disintegrating tablet 2 mg  2 mg Oral QHS Antonieta Pert, MD      . traZODone (DESYREL) tablet 50 mg  50 mg Oral QHS  PRN Antonieta Pert, MD       PTA Medications: No medications prior to admission.    Musculoskeletal: Strength & Muscle Tone: within normal limits Gait & Station: normal Patient leans: N/A  Psychiatric Specialty Exam: Physical Exam Vitals and nursing note reviewed.  HENT:     Head: Normocephalic and atraumatic.  Pulmonary:     Effort: Pulmonary effort is normal.  Neurological:     General: No focal deficit present.     Mental Status: He is alert and oriented to person, place, and time.     Review of Systems  Blood pressure 114/83, pulse 61, temperature 97.7 F (36.5 C), temperature source Oral, height 5\' 9"  (1.753 m), weight 63 kg, SpO2 98 %.Body mass index is 20.53 kg/m.  General Appearance: Disheveled  Eye Contact:  Good  Speech:  Normal Rate  Volume:  Increased  Mood:  Dysphoric and Irritable  Affect:  Labile  Thought Process:  Coherent and Descriptions of Associations: Circumstantial  Orientation:  Full (Time, Place, and Person)  Thought Content:  Rumination  Suicidal Thoughts:  No  Homicidal Thoughts:  No  Memory:  Immediate;   Poor Recent;   Poor Remote;   Poor  Judgement:  Impaired  Insight:  Lacking  Psychomotor Activity:  Normal  Concentration:  Concentration: Fair and Attention Span: Fair  Recall:  [employer] of Knowledge:  Fair  Language:  Good  Akathisia:  Negative  Handed:  Right  AIMS (if indicated):     Assets:  Desire for Improvement Resilience  ADL's:  Intact  Cognition:  WNL  Sleep:       Treatment Plan Summary: Daily contact with patient to assess and evaluate symptoms and progress in treatment, Medication management and Plan : Patient is seen and examined.  Patient is a 34 year old male with a past psychiatric history significant for alcohol dependence and antisocial  personality disorder as well as hepatitis C who originally presented to the [facility] emergency department secondary to intoxication, was released,  and then return to the emergency department after what he reported to be an intentional overdose of Tylenol.  Despite the fact that there was no Tylenol detected in his system apparently was not truly recognize.  Review of the electronic medical record revealed that he had been kicked out of his uncle's place (most likely secondary to relapsing on alcohol).  His main complaint today was right upper quadrant pain.  When we discussed discharge given that he had no psychiatric complaints initially he then became quite angry and stated "I will cut my whole head off".  5 minutes earlier he stated he has been stressed out because of the custody battle.  I suspect that this hospitalization is secondary gain, and we will watch him for 24 hours.  Given the fact of his irritability we will place him on the agitation protocol with as needed Geodon if necessary.  There will also be available lorazepam 1 mg p.o. every 6 hours as needed a CIWA greater than 10.  Review of his admission laboratories revealed mildly elevated AST and ALT at 45 and 47.  This is actually unchanged from 3 days ago and slightly less than a month ago.  His white blood cell count was slightly elevated at 10.6.  His MCV was normal at 95.5.  Platelets were normal at 269,000.  He has had 3 acetaminophen serum levels done and all 3 were less than 10.  His blood alcohol on 9/8 was less than 10.  Salicylate was less than 7.  Drug screen was positive for cannabinoids.  He did admit to using delta 8 on a regular basis.  He is already stated he is not interested in going to substance rehabilitation, and apparently he just got out of there within the last several weeks.  His vital signs are stable, he is afebrile.  He has not had any imaging of his liver done, and does have a history of hepatitis C.  We will send him for a liver ultrasound today if at all possible.  Observation Level/Precautions:  15 minute checks  Laboratory:  Chemistry Profile  Psychotherapy:     Medications:    Consultations:    Discharge Concerns:    Estimated LOS:  Other:     Physician Treatment Plan for Primary Diagnosis: <principal problem not specified> Long Term Goal(s): Improvement in symptoms so as ready for discharge  Short Term Goals: Ability to identify changes in lifestyle to reduce recurrence of condition will improve, Ability to verbalize feelings will improve, Ability to disclose and discuss suicidal ideas, Ability to demonstrate self-control will improve, Ability to identify and develop effective coping behaviors will improve, Ability to maintain clinical measurements within normal limits will improve, Compliance with prescribed medications will improve and Ability to identify triggers associated with substance abuse/mental health issues will improve  Physician Treatment Plan for Secondary Diagnosis: Active Problems:   Intentional overdose of drug in tablet form (HCC)  Long Term Goal(s): Improvement in symptoms so as ready for discharge  Short Term Goals: Ability to identify changes in lifestyle to reduce recurrence of condition will improve, Ability to verbalize feelings will improve, Ability to disclose and discuss suicidal ideas, Ability to demonstrate self-control will improve, Ability to identify and develop effective coping behaviors will improve, Ability to maintain clinical measurements within normal limits will improve, Compliance with prescribed medications will improve  and Ability to identify triggers associated with substance abuse/mental health issues will improve  I certify that inpatient services furnished can reasonably be expected to improve the patient's condition.    Antonieta Pert, MD 9/9/20213:47 PM

## 2020-08-28 NOTE — BHH Counselor (Signed)
Adult Comprehensive Assessment  Patient ID: Nathan [name], male   DOB: 1986/06/14, 34 y.o.   MRN: [id number]  Information Source: Information source: Patient  Current Stressors:  Patient states their primary concerns and needs for treatment are:: "My side hurts" Patient states their goals for this hospitilization and ongoing recovery are:: No goal identified Educational / Learning stressors: None reported Employment / Job issues: Concentration issues while working Family Relationships: No family Social worker / Lack of resources (include bankruptcy): None reported Housing / Lack of housing: Pt has housing but stated that he hates living alone Physical health (include injuries & life threatening diseases): No stress reported Social relationships: Issues in relationship with the mother of his son; current pending custody hearing Substance abuse: UDS positve for [facility]; Pt recently discharged from hospital on 08/26/2020 for issue related to alcohol withdrawl Bereavement / Loss: No stress reported  Living/Environment/Situation:  Living Arrangements: Alone Living conditions (as described by patient or guardian): Did not disclose Who else lives in the home?: Pt resides alone How long has patient lived in current situation?: Did not disclose 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  Childhood History:  By whom was/is the patient raised?: Mother Additional childhood history information: Pt was guarded and did not diclose further information Description of patient's relationship  with caregiver when they were a child: Pt did not disclose Patient's description of current relationship with people who raised him/her: Pt stated that he and mther have not spoken in a year; when CSW requested elaboration Pt declined How were you disciplined when you got in trouble as a child/adolescent?: Pt did not disclose Does patient have siblings?: Yes Number of Siblings: 2 Description of patient's current relationship with siblings: Pt has one oldere and one younger brother, whom has minimal to no contact with Did patient suffer any verbal/emotional/physical/sexual abuse as a child?:  (Pt did not disclose to this evaluator.) Did patient suffer from severe childhood neglect?:  (Pt did not disclose to this evaluator) Has patient ever been sexually abused/assaulted/raped as an adolescent or adult?:  (Pt did not disclose) Was the patient ever a victim of a crime or a disaster?: No Witnessed domestic violence?:  (Pt did not disclose to this Architect) Has patient been affected by domestic violence as an adult?: No  Education:  Highest grade of school patient has completed: 28 Currently a student?: No Learning disability?: No  Employment/Work Situation:   Patient's job has been impacted by current illness: Yes Describe how patient's job has been impacted: Pt reports concentration issues due to racing thoughts of external stress while on the job What is the longest time patient has a held a job?: 6 years Where was the patient employed at that time?: "[employer]" Has patient ever been in the [employer]?: No  Financial Resources:      Alcohol/Substance Abuse:   What has been your use of drugs/alcohol within the last 12 months?: Pt uses marijuan regularly, pt denies recent use of any other illicit drugs. Pt stated that he drinks 18-24 beers weekly. Pt stated that this is an improvement, as he used to drink  18-24 beers daily If attempted suicide, did drugs/alcohol play a role in this?:  Yes Alcohol/Substance Abuse Treatment Hx: Past detox If yes, describe treatment: Pt reports being hospitalized twice for alcohol detox; he denies seizure history or history of DTs Has alcohol/substance abuse ever caused legal problems?: Yes (Pt reports that he is no currently involved with the legal system but has a history of probation and Court involvement)  Social Support System:   Patient's Community Support System: None Describe Community Support System: None reported Type of faith/religion: Non-religious How does patient's faith help to cope with current illness?: n/a  Leisure/Recreation:   Do You Have Hobbies?: Yes Leisure and Hobbies: "Fishing" "spending time with my son when I can"  Strengths/Needs:   What is the patient's perception of their strengths?: Improvement / decrease in severity of alochol use Patient states they can use these personal strengths during their treatment to contribute to their recovery: Pt ambivalent regarding recovery needs post discharge Patient states these barriers may affect/interfere with their treatment: Pt remains stressed regarding pending custody hearing Patient states these barriers may affect their return to the community: [place] issues with return to community Other important information patient would like considered in planning for their treatment: None reported  Discharge Plan:   Currently receiving community mental health services: No Patient states concerns and preferences for aftercare planning are: Pt is not insured Patient states they will know when they are safe and ready for discharge when: Pt is unsure at this time Does patient have access to transportation?: No Does patient have financial barriers related to discharge medications?: Yes Patient description of barriers related to discharge medications: Pt is not insured Plan for no access to transportation at discharge: CSW to investigate further Will patient be returning to same  living situation after discharge?: Yes  Summary/Recommendations:   Summary and Recommendations (to be completed by the evaluator): CSW met with Pt for PSA completion. Pt was linear in speech, however he was guarded and presented with some irritability. Nathan [name] is a 34 year old white male admitted involuntarily due to SI and recent drug overdose on OTC medication (Tylenol). Pt reports stress related to a "custody battle" between he and ex-girlfriend. Pt denies HI, AVH or thoughts of self-harm Pt reports excessive alcohol use, having upwards of 83-41 alcoholic beverages in a week. Pt was recently discharged from medical center after being admitted for symptoms related to alcohol detox. Pt does not currently receive any community-based services and is ambivalent regarding follow-up arrangements at discharge. Pt does have history of medication management, however he stated he has not been on a psychotropic medication in "years". Pt declined family contact, as he lacks healthy supports. He did not identify any other support system that can aid in further safety and stabilization post discharge. Patient will benefit from crisis stabilization, medication evaluation, group therapy and psychoeducation, in addition to case management for discharge planning. At discharge it is recommended that Patient adhere to the established discharge plan and continue in treatment.  Jacklynn Lewis . 08/28/2020

## 2020-08-28 NOTE — ED Notes (Signed)
Attempted to call [facility] at [phone] multiple times with no answer. Transport here for patient transfer to [facility]. Belongings given to sheriff.

## 2020-08-28 NOTE — Plan of Care (Signed)
Nurse discussed anxiety, depression and coping skills with patient.  

## 2020-08-28 NOTE — ED Notes (Signed)
Called [facility] for ACSheriff's Transport to [employer]. [facility] [place], to be picked up after [phone]

## 2020-08-28 NOTE — BHH Suicide Risk Assessment (Signed)
[facility] Admission Suicide Risk Assessment   Nursing information obtained from:    Demographic factors:    Current Mental Status:    Loss Factors:    Historical Factors:    Risk Reduction Factors:     Total Time spent with patient: 30 minutes Principal Problem: <principal problem not specified> Diagnosis:  Active Problems:   Intentional overdose of drug in tablet form (HCC)  Subjective Data: Patient is seen and examined.  Patient is a 34 year old male with a past psychiatric history significant for alcohol dependence who originally presented to the [facility] emergency department on 08/25/2020 with suicidal ideation.  He reported a history of bipolar disorder, alcohol abuse, polysubstance abuse and hepatitis C who presented to the emergency room for evaluation.  The patient stated "I have been doing so well then I messed up".  He stated he had been drinking the evening of admission was having thoughts of killing himself.  He would not elaborate on any plan.  He admitted to marijuana use but denied any other drug use.  His medications on admission at that time were ibuprofen and omeprazole.  His laboratories at that time showed a normal AST of 45 and an ALT of 48.  His acetaminophen level was less than 10.  His blood alcohol at that time was 203.  Drug screen was positive for cannabinoids.  Nursing notes on 08/25/2020 showed that he denied suicidal ideation.  He was having homicidal ideation but would not tell the writer who he was having these thoughts about.  He denied any auditory or visual hallucinations.  Somehow or another there was some admission of suicidal ideation, and he was placed under involuntary commitment by the emergency room doctor.  He had a behavioral health assessment and that he apparently additional information showed that the police had brought him in.  He was cursing in triage.  He was aggressive in triage.  He was taken in by officers.  The patient admitted at  that time that he had become upset after his uncle kicked him out of the home for no reason.  He had been quoted as saying I have been doing everything I am supposed to do in got put out on the streets for no reason".  He reported being homeless and in need of a place to go.  He admitted to homicidal ideation towards his uncle, but denied any current homicidal ideation.  He had recently started a job at [employer].  He stated today he had been working there for 2 weeks.  He stated he had tried local programs like [facility] and [facility] but they made him "worse not better".  He has had multiple diagnoses in the past including alcohol dependence, depression and personality disorder.  He stated today that he taken medications all the way from "Prozac to Zoloft".  Later on 08/26/2020 the patient wanted to go home post intoxication.  He contracted for safety and was not actively suicidal or homicidal.  He was discharged home.  On 08/27/2020 he returned to the emergency room and stated that he had overdosed on 30 Tylenol.  He was complaining of right upper quadrant pain, nausea and fatigue.  He stated that that time he was suicidal and it was a suicide attempt.  On 9/8 his AST was 45 and ALT was 47.  His acetaminophen level was less than 10.  He was seen again by psychiatry and stated that he had been overdosed on Tylenol.  His evaluation stated  that he was vague on motives, but felt depressed, hopeless, helpless and his lack of energy.  He was transferred to our facility for evaluation and stabilization.  His main complaint today is right upper quadrant pain.  He stated he needed to get some rest.  He was irritable.  Continued Clinical Symptoms:    The "Alcohol Use Disorders Identification Test", Guidelines for Use in Primary Care, Second Edition.  World Science writer [facility]). Score between 0-7:  no or low risk or alcohol related problems. Score between 8-15:  moderate risk of alcohol related problems. Score between 16-19:   high risk of alcohol related problems. Score 20 or above:  warrants further diagnostic evaluation for alcohol dependence and treatment.   CLINICAL FACTORS:   Alcohol/Substance Abuse/Dependencies   Musculoskeletal: Strength & Muscle Tone: within normal limits Gait & Station: normal Patient leans: N/A  Psychiatric Specialty Exam: Physical Exam Vitals and nursing note reviewed.  HENT:     Head: Normocephalic and atraumatic.  Pulmonary:     Effort: Pulmonary effort is normal.  Neurological:     General: No focal deficit present.     Mental Status: He is oriented to person, place, and time.     Review of Systems  There were no vitals taken for this visit.There is no height or weight on file to calculate BMI.  General Appearance: Disheveled  Eye Contact:  Fair  Speech:  Normal Rate  Volume:  Increased  Mood:  Irritable  Affect:  Labile  Thought Process:  Goal Directed and Descriptions of Associations: Intact  Orientation:  Full (Time, Place, and Person)  Thought Content:  Logical  Suicidal Thoughts:  Yes.  without intent/plan  Homicidal Thoughts:  No  Memory:  Immediate;   Poor Recent;   Poor Remote;   Poor  Judgement:  Impaired  Insight:  Lacking  Psychomotor Activity:  Increased  Concentration:  Concentration: Good and Attention Span: Good  Recall:  Fair  Fund of Knowledge:  Fair  Language:  Good  Akathisia:  Negative  Handed:  Right  AIMS (if indicated):     Assets:  Desire for Improvement Resilience  ADL's:  Intact  Cognition:  WNL  Sleep:         COGNITIVE FEATURES THAT CONTRIBUTE TO RISK:  Thought constriction (tunnel vision)    SUICIDE RISK:   Minimal: No identifiable suicidal ideation.  Patients presenting with no risk factors but with morbid ruminations; may be classified as minimal risk based on the severity of the depressive symptoms  PLAN OF CARE: Patient is seen and examined.  Patient is a 34 year old male with a past psychiatric history  significant for alcohol dependence and antisocial personality disorder as well as hepatitis C who originally presented to the [facility] emergency department secondary to intoxication, was released, and then return to the emergency department after what he reported to be an intentional overdose of Tylenol.  Despite the fact that there was no Tylenol detected in his system apparently was not truly recognize.  Review of the electronic medical record revealed that he had been kicked out of his uncle's place (most likely secondary to relapsing on alcohol).  His main complaint today was right upper quadrant pain.  When we discussed discharge given that he had no psychiatric complaints initially he then became quite angry and stated "I will cut my whole head off".  5 minutes earlier he stated he has been stressed out because of the custody battle.  I suspect  that this hospitalization is secondary gain, and we will watch him for 24 hours.  Given the fact of his irritability we will place him on the agitation protocol with as needed Geodon if necessary.  There will also be available lorazepam 1 mg p.o. every 6 hours as needed a CIWA greater than 10.  Review of his admission laboratories revealed mildly elevated AST and ALT at 45 and 47.  This is actually unchanged from 3 days ago and slightly less than a month ago.  His white blood cell count was slightly elevated at 10.6.  His MCV was normal at 95.5.  Platelets were normal at 269,000.  He has had 3 acetaminophen serum levels done and all 3 were less than 10.  His blood alcohol on 9/8 was less than 10.  Salicylate was less than 7.  Drug screen was positive for cannabinoids.  He did admit to using delta 8 on a regular basis.  He is already stated he is not interested in going to substance rehabilitation, and apparently he just got out of there within the last several weeks.  His vital signs are stable, he is afebrile.  He has not had any imaging of his  liver done, and does have a history of hepatitis C.  We will send him for a liver ultrasound today if at all possible.  I certify that inpatient services furnished can reasonably be expected to improve the patient's condition.   Antonieta Pert, MD 08/28/2020, 1:47 PM

## 2020-08-28 NOTE — BH Assessment (Signed)
Referral information for Psychiatric Hospitalization faxed to;   [facility] ([phone]), No current adult beds available   [facility] [phone]),   [address] [phone]),    [facility] [phone]- [phone]),    [facility] [phone])   [facility] ([phone]--[phone])

## 2020-08-28 NOTE — BHH Suicide Risk Assessment (Signed)
BHH INPATIENT:  Family/Significant Other Suicide Prevention Education  Suicide Prevention Education:  Patient Refusal for Family/Significant Other Suicide Prevention Education: The patient Nathan [name] has refused to provide written consent for family/significant other to be provided Family/Significant Other Suicide Prevention Education during admission and/or prior to discharge.  Physician notified.  Pt lacks healthy supports, therefore no family or significant other was identified by patient for completion of SPE. CSW completed SPE with Pt. SPI pamphlet provided to Pt and Pt was encouraged to shared information with future identified support system, ask related questions and share any concerns related to SPE information. In addition, Pt has been provided contact information for mobile crisis.   Jacinta Shoe, MSW, LCSW 08/28/2020, 4:39 PM

## 2020-08-28 NOTE — ED Notes (Signed)
Patient off unit to [place] via sheriff escort.

## 2020-08-28 NOTE — BH Assessment (Signed)
Per [facility], Patient PENDING transfer to [facility] on 08/28/20 after Negative Covid Results are re-checked and Updated Vital Signs are completed. Accepting information will be given once those are completed.   Communicated this to: EDP Dr. Don Perking ED Secretary Rivka Barbara ED RN Viviann Spare

## 2020-08-28 NOTE — Tx Team (Signed)
Initial Treatment Plan 08/28/2020 4:11 PM Merwyn Katos HEN:[id number]    PATIENT STRESSORS: Financial difficulties Marital or family conflict Occupational concerns Substance abuse   PATIENT STRENGTHS: Ability for insight Average or above average intelligence Capable of independent living Communication skills Motivation for treatment/growth Supportive family/friends Work skills   PATIENT IDENTIFIED PROBLEMS: "suicide thoughts"  "substance abuse"  "depression"  "anxiety"               DISCHARGE CRITERIA:  Ability to meet basic life and health needs Adequate post-discharge living arrangements Improved stabilization in mood, thinking, and/or behavior Medical problems require only outpatient monitoring Motivation to continue treatment in a less acute level of care Need for constant or close observation no longer present Reduction of life-threatening or endangering symptoms to within safe limits Safe-care adequate arrangements made Verbal commitment to aftercare and medication compliance Withdrawal symptoms are absent or subacute and managed without 24-hour nursing intervention  PRELIMINARY DISCHARGE PLAN: Attend aftercare/continuing care group Attend PHP/IOP Attend 12-step recovery group Outpatient therapy Return to previous living arrangement Return to previous work or school arrangements  PATIENT/FAMILY INVOLVEMENT: This treatment plan has been presented to and reviewed with the patient, Nathan [name].  The patient and family have been given the opportunity to ask questions and make suggestions.  Quintella Reichert [place], RN 08/28/2020, 4:11 PM

## 2020-08-28 NOTE — BH Assessment (Signed)
PATIENT BED AVAILABLE AT 10AM  Patient has been accepted to [facility].  Patient assigned to room 504 Bed 2 Accepting physician is Psyc NP Nira Conn.  Call report to [phone].  Representative was [facility].   ER Staff is aware of it:  [facility] ER Secretary  Dr. Don Perking, ER MD  Viviann Spare Patient's Nurse
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
## 2020-08-29 ENCOUNTER — Inpatient Hospital Stay (HOSPITAL_COMMUNITY): Payer: No Typology Code available for payment source

## 2020-08-29 DIAGNOSIS — F19980 Other psychoactive substance use, unspecified with psychoactive substance-induced anxiety disorder: Secondary | ICD-10-CM

## 2020-08-29 DIAGNOSIS — F17200 Nicotine dependence, unspecified, uncomplicated: Secondary | ICD-10-CM

## 2020-08-29 DIAGNOSIS — T50902A Poisoning by unspecified drugs, medicaments and biological substances, intentional self-harm, initial encounter: Secondary | ICD-10-CM

## 2020-08-29 DIAGNOSIS — F1020 Alcohol dependence, uncomplicated: Secondary | ICD-10-CM | POA: Diagnosis present

## 2020-08-29 DIAGNOSIS — Z8719 Personal history of other diseases of the digestive system: Secondary | ICD-10-CM

## 2020-08-29 DIAGNOSIS — F3163 Bipolar disorder, current episode mixed, severe, without psychotic features: Principal | ICD-10-CM

## 2020-08-29 MED ORDER — HYDROXYZINE HCL 50 MG PO TABS
50.0000 mg | ORAL_TABLET | Freq: Every evening | ORAL | Status: DC | PRN
  Administered 2020-08-29: 50 mg via ORAL
  Filled 2020-08-29: qty 1

## 2020-08-29 MED ORDER — RISPERIDONE 2 MG PO TBDP
3.0000 mg | ORAL_TABLET | Freq: Every day | ORAL | Status: DC
  Administered 2020-08-29: 3 mg via ORAL
  Filled 2020-08-29 (×4): qty 1
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
## 2020-08-29 NOTE — Progress Notes (Signed)
Recreation Therapy Notes  INPATIENT RECREATION THERAPY ASSESSMENT  Patient Details Name: Nathan [name] MRN: [id number] DOB: 11-19-1986 Today's Date: 08/29/2020       Information Obtained From: Patient  Able to Participate in Assessment/Interview: Yes  Patient Presentation: Alert  Reason for Admission (Per Patient): Suicide Attempt  Patient Stressors: Other (Comment) (Custody battle)  Coping Skills:   Isolation, Self-Injury, TV, Arguments, Aggression, Impulsivity, Avoidance  Leisure Interests (2+):  Nature - Ambulance person of Recreation/Participation: Other (Comment) (Doesn't go anymore)  Awareness of Community Resources:  Yes  Community Resources:  Park, Public affairs consultant  Current Use: Yes  If no, Barriers?:    Expressed Interest in [employer] Information: No  [employer] of Residence:  Film/video editor  Patient Main Form of Transportation: Walk  Patient Strengths:  "I don't know"  Patient Identified Areas of Improvement:  Communication; Anger  Patient Goal for Hospitalization:  "try to get head back right"  Current SI (including self-harm):  Yes (Rated a 3; Contracts for safety)  Current HI:  No  Current AVH: No  Staff Intervention Plan: Group Attendance, Collaborate with Interdisciplinary Treatment Team  Consent to Intern Participation: N/A   Caroll Rancher, LRT/CTRS  Caroll Rancher A 08/29/2020, 1:28 PM

## 2020-08-29 NOTE — Tx Team (Signed)
Interdisciplinary Treatment and Diagnostic Plan Update  08/29/2020 Time of Session: 10:45AM Nathan [name] MRN: [id number]  Principal Diagnosis: <principal problem not specified>  Secondary Diagnoses: Active Problems:   Intentional overdose of drug in tablet form ([place])   Current Medications:  Current Facility-Administered Medications  Medication Dose Route Frequency Provider Last Rate Last Admin  . acetaminophen (TYLENOL) tablet 650 mg  650 mg Oral Q6H PRN Sharma Covert, MD      . alum & mag hydroxide-simeth (MAALOX/MYLANTA) 200-200-20 MG/5ML suspension 30 mL  30 mL Oral Q4H PRN Sharma Covert, MD      . hydrOXYzine (ATARAX/VISTARIL) tablet 25 mg  25 mg Oral TID PRN Sharma Covert, MD   25 mg at 08/28/20 2109  . OLANZapine zydis (ZYPREXA) disintegrating tablet 10 mg  10 mg Oral Q8H PRN Sharma Covert, MD       And  . LORazepam (ATIVAN) tablet 1 mg  1 mg Oral PRN Sharma Covert, MD      . magnesium hydroxide (MILK OF MAGNESIA) suspension 30 mL  30 mL Oral Daily PRN Sharma Covert, MD      . nicotine polacrilex (NICORETTE) gum 2 mg  2 mg Oral PRN Sharma Covert, MD   2 mg at 08/28/20 2151  . OLANZapine (ZYPREXA) injection 10 mg  10 mg Intramuscular Q6H PRN Sharma Covert, MD      . pantoprazole (PROTONIX) EC tablet 40 mg  40 mg Oral Daily Sharma Covert, MD   40 mg at 08/29/20 0807  . risperiDONE (RISPERDAL M-TABS) disintegrating tablet 2 mg  2 mg Oral QHS Sharma Covert, MD   2 mg at 08/28/20 2109  . traZODone (DESYREL) tablet 50 mg  50 mg Oral QHS PRN Sharma Covert, MD       PTA Medications: No medications prior to admission.    Patient Stressors: Financial difficulties Marital or family conflict Occupational concerns Substance abuse  Patient Strengths: Ability for insight Average or above average intelligence Capable of independent living Agricultural engineer for treatment/growth Supportive family/friends Work  skills  Treatment Modalities: Medication Management, Group therapy, Case management,  1 to 1 session with clinician, Psychoeducation, Recreational therapy.   Physician Treatment Plan for Primary Diagnosis: <principal problem not specified> Long Term Goal(s): Improvement in symptoms so as ready for discharge Improvement in symptoms so as ready for discharge   Short Term Goals: Ability to identify changes in lifestyle to reduce recurrence of condition will improve Ability to verbalize feelings will improve Ability to disclose and discuss suicidal ideas Ability to demonstrate self-control will improve Ability to identify and develop effective coping behaviors will improve Ability to maintain clinical measurements within normal limits will improve Compliance with prescribed medications will improve Ability to identify triggers associated with substance abuse/mental health issues will improve Ability to identify changes in lifestyle to reduce recurrence of condition will improve Ability to verbalize feelings will improve Ability to disclose and discuss suicidal ideas Ability to demonstrate self-control will improve Ability to identify and develop effective coping behaviors will improve Ability to maintain clinical measurements within normal limits will improve Compliance with prescribed medications will improve Ability to identify triggers associated with substance abuse/mental health issues will improve  Medication Management: Evaluate patient's response, side effects, and tolerance of medication regimen.  Therapeutic Interventions: 1 to 1 sessions, Unit Group sessions and Medication administration.  Evaluation of Outcomes: Not Met   Physician Treatment Plan for Secondary Diagnosis: Active Problems:  Intentional overdose of drug in tablet form ([place])  Long Term Goal(s): Improvement in symptoms so as ready for discharge Improvement in symptoms so as ready for discharge   Short Term  Goals: Ability to identify changes in lifestyle to reduce recurrence of condition will improve Ability to verbalize feelings will improve Ability to disclose and discuss suicidal ideas Ability to demonstrate self-control will improve Ability to identify and develop effective coping behaviors will improve Ability to maintain clinical measurements within normal limits will improve Compliance with prescribed medications will improve Ability to identify triggers associated with substance abuse/mental health issues will improve Ability to identify changes in lifestyle to reduce recurrence of condition will improve Ability to verbalize feelings will improve Ability to disclose and discuss suicidal ideas Ability to demonstrate self-control will improve Ability to identify and develop effective coping behaviors will improve Ability to maintain clinical measurements within normal limits will improve Compliance with prescribed medications will improve Ability to identify triggers associated with substance abuse/mental health issues will improve     Medication Management: Evaluate patient's response, side effects, and tolerance of medication regimen.  Therapeutic Interventions: 1 to 1 sessions, Unit Group sessions and Medication administration.  Evaluation of Outcomes: Not Met   RN Treatment Plan for Primary Diagnosis: <principal problem not specified> Long Term Goal(s): Knowledge of disease and therapeutic regimen to maintain health will improve  Short Term Goals: Ability to verbalize frustration and anger appropriately will improve, Ability to demonstrate self-control and Ability to identify and develop effective coping behaviors will improve  Medication Management: RN will administer medications as ordered by provider, will assess and evaluate patient's response and provide education to patient for prescribed medication. RN will report any adverse and/or side effects to prescribing provider.    Therapeutic Interventions: 1 on 1 counseling sessions, Psychoeducation, Medication administration, Evaluate responses to treatment, Monitor vital signs and CBGs as ordered, Perform/monitor CIWA, COWS, AIMS and Fall Risk screenings as ordered, Perform wound care treatments as ordered.  Evaluation of Outcomes: Not Met   LCSW Treatment Plan for Primary Diagnosis: <principal problem not specified> Long Term Goal(s): Safe transition to appropriate next level of care at discharge, Engage patient in therapeutic group addressing interpersonal concerns.  Short Term Goals: Engage patient in aftercare planning with referrals and resources, Increase social support and Identify triggers associated with mental health/substance abuse issues  Therapeutic Interventions: Assess for all discharge needs, 1 to 1 time with Social worker, Explore available resources and support systems, Assess for adequacy in community support network, Educate family and significant other(s) on suicide prevention, Complete Psychosocial Assessment, Interpersonal group therapy.  Evaluation of Outcomes: Not Met   Progress in Treatment: Attending groups: Yes. Participating in groups: Yes. Taking medication as prescribed: Yes. Toleration medication: Yes. Family/Significant other contact made: No, Pt declined consents as he lacks healthy social supports Patient understands diagnosis: Yes. Discussing patient identified problems/goals with staff: Yes. Medical problems stabilized or resolved: No. Denies suicidal/homicidal ideation: Yes. Issues/concerns per patient self-inventory: No.   New problem(s) identified: No, Describe:  None  New Short Term/Long Term Goal(s): develop medication regimine, maintain medication regimen post discharge, receive CSW support via referral for medication management follow-up, practice coping skills daily  Patient Goals:  "I need to get my head right. I want the medication to help slow my mind  down."  Discharge Plan or Barriers: Pt is not insured and requires sliding scale services, Pt does not have own transportation, however he will be referred for medication management. CSW will continue  to assess for additional needs for discharge plans.  Reason for Continuation of Hospitalization: Anxiety Depression Medication stabilization  Estimated Length of Stay: 3 - 5 days  Attendees: Patient: Nathan [name] 08/29/2020 10:54 AM  Physician: Dr. Melba Coon 08/29/2020 10:54 AM  Nursing:  08/29/2020 10:54 AM  RN Care Manager: 08/29/2020 10:54 AM  Social Worker: Freddi Che, LCSW 08/29/2020 10:54 AM  Recreational Therapist:  08/29/2020 10:54 AM  Other: Dr. Demaris Callander, Pysch. Resident 08/29/2020 10:54 AM  Other: Dr. Meredith Staggers, Psych. Resident 08/29/2020 10:54 AM  Other: Darletta Moll, LCSW 08/29/2020 10:54 AM    Scribe for Treatment Team: Freddi Che, LCSW 08/29/2020 10:54 AM

## 2020-08-29 NOTE — Progress Notes (Signed)
[facility] MD Progress Note  08/29/2020 12:41 PM Nathan [name]  MRN:  [id number] ]  Subjective: "I have a lot of side pain." Principal Problem: Bipolar 1 disorder, mixed, severe (HCC) Diagnosis: Principal Problem:   Bipolar 1 disorder, mixed, severe (HCC) Active Problems:   Substance or medication-induced anxiety disorder (HCC)   Tobacco use disorder   History of gastroesophageal reflux (GERD)   Intentional overdose of drug in tablet form (HCC)   Moderate alcohol use disorder (HCC)  Total Time spent with patient: 35 minutes   History of Present Illness on intake: Patient is seen and examined. Patient is a 34 year old male with a past psychiatric history significant for alcohol dependence who originally presented to the [facility] emergency department on 08/25/2020 with suicidal ideation. He reported a history of bipolar disorder, alcohol abuse, polysubstance abuse and hepatitis C who presented to the emergency room for evaluation. The patient stated "I have been doing so well then I messed up". He stated he had been drinking the evening of admission was having thoughts of killing himself. He would not elaborate on any plan. He admitted to marijuana use but denied any other drug use. His medications on admission at that time were ibuprofen and omeprazole. His laboratories at that time showed a normal AST of 45 and an ALT of 48. His acetaminophen level was less than 10. His blood alcohol at that time was 203. Drug screen was positive for cannabinoids. Nursing notes on 08/25/2020 showed that he denied suicidal ideation. He was having homicidal ideation but would not tell the writer who he was having these thoughts about. He denied any auditory or visual hallucinations. Somehow or another there was some admission of suicidal ideation, and he was placed under involuntary commitment by the emergency room doctor. He had a behavioral health assessment and that he apparently  additional information showed that the police had brought him in. He was cursing in triage. He was aggressive in triage. He was taken in by officers. The patient admitted at that time that he had become upset after his uncle kicked him out of the home for no reason. He had been quoted as saying I have been doing everything I am supposed to do in got put out on the streets for no reason". He reported being homeless and in need of a place to go. He admitted to homicidal ideation towards his uncle, but denied any current homicidal ideation. He had recently started a job at [employer]. He stated today he had been working there for 2 weeks. He stated he had tried local programs like [facility] and [facility] but they made him "worse not better". He has had multiple diagnoses in the past including alcohol dependence, depression and personality disorder. He stated today that he taken medications all the way from "Prozac to Zoloft". Later on 08/26/2020 the patient wanted to go home post intoxication. He contracted for safety and was not actively suicidal or homicidal. He was discharged home. On 08/27/2020 he returned to the emergency room and stated that he had overdosed on 30 Tylenol. He was complaining of right upper quadrant pain, nausea and fatigue. He stated that that time he was suicidal and it was a suicide attempt. On 9/8 his AST was 45 and ALT was 47. His acetaminophen level was less than 10. He was seen again by psychiatry and stated that he had been overdosed on Tylenol. His evaluation stated that he was vague on motives, but felt depressed, hopeless, helpless  and his lack of energy. He was transferred to our facility for evaluation and stabilization. His main complaint today is right upper quadrant pain. He stated he needed to get some rest. He was irritable.  Associated Signs/Symptoms: Depression Symptoms:  anhedonia, insomnia, fatigue, suicidal thoughts with specific plan, disturbed  sleep, (Hypo) Manic Symptoms:  Impulsivity, Irritable Mood, Lability of Mood, Anxiety Symptoms:  Excessive Worry, Psychotic Symptoms:  denied PTSD Symptoms: Negative  Past Psychiatric History: Patient has a longstanding history of alcohol dependence as well as antisocial personality disorder.  It looks as though he has had 1 or 2 psychiatric admissions at [facility] in the past.  His most recent rehab facility stay was within the last 4 weeks or so.  He felt as though going to rehab was "made me worse not better".  He states he has been treated for "bipolar disorder".  He stated he has been treated with "everything from Prozac to Zoloft".  Review of the electronic medical record revealed no psychiatric medications from his multiple ER visits at [facility].   08/29/2020: Patient has been isolating to her room.  He has been urged to attend groups, but does not come out with prompting .  He states however, that his medication is making him too sedated.  He also has complaints of right upper quadrant abdominal pain that he feels has not been addressed.  He states that he initially went to the emergency department for evaluation of this pain, however he made a comment that the pain was so severe that he would rather be dead.  He continues to endorse suicidal ideation, however he also states that he is also in a custody battle to ensure that he is able to continue seeing his 72-year-old son.  He does not specify plan or intent of suicide at this time.  Patient states his goal for hospitalization is to get back on medicine, keep himself safe, and slow tapering down.  Patient denies any homicidal ideation.  He states that he lives alone.  He is denying any auditory or visual hallucinations today.  He states that he does have a history of bipolar disorder, but has not been on medications.  Patient has been compliant with medications while hospitalized, however reports oversedation.  We will  continue to monitor.  Patient had an abdominal ultrasound completed on 08/29/2020: Findings revealed a contracted gallbladder with the right upper quadrant transducer tenderness but no shadowing suspicion for gallbladder stones.  There is borderline wall thickening of the gallbladder. Liver normal appearing, without any lesions, and normal blood flow.   Past Medical History:  Past Medical History:  Diagnosis Date  . Anxiety   . Bipolar 1 disorder (HCC)   . Chronic headache   . Depression 03/30/2020  . ETOH abuse 03/30/2020  . 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 Horseshoe kidney   . Marijuana smoker 03/30/2020    Past Surgical History:  Procedure Laterality Date  . TONSILLECTOMY     Family History:  Family History  Problem Relation Age of Onset  . Hypertension Mother   . Diabetes Mother   . Hypertension Father   . Heart failure Father   . Diabetes Father    Family Psychiatric  History: Denied  Social History:  Social History   Substance and Sexual Activity  Alcohol Use Yes  . Alcohol/week: 24.0 standard drinks  . Types: 24 Cans of beer per week   Comment: 24 beers weekly  Social History   Substance and Sexual Activity  Drug Use Yes  . Types: IV, Marijuana   Comment: 2-3 joints weekly    Social History   Socioeconomic History  . Marital status: Single    Spouse name: Not on file  . Number of children: 0  . Years of education: 1  . Highest education level: Not on file  Occupational History  . Not on file  Tobacco Use  . Smoking status: Current Every Day Smoker    Packs/day: 1.00    Types: Cigarettes  . Smokeless tobacco: Current User    Types: Chew  Vaping Use  . Vaping Use: Never used  Substance and Sexual Activity  . Alcohol use: Yes    Alcohol/week: 24.0 standard drinks    Types: 24 Cans of beer per week    Comment: 24 beers weekly  . Drug use: Yes    Types: IV, Marijuana    Comment: 2-3 joints weekly  . Sexual activity: Not Currently   Other Topics Concern  . Not on file  Social History Narrative  . Not on file   Social Determinants of Health   Financial Resource Strain:   . Difficulty of Paying Living Expenses: Not on file  Food Insecurity:   . Worried About Programme researcher, broadcasting/film/video in the Last Year: Not on file  . Ran Out of Food in the Last Year: Not on file  Transportation Needs:   . Lack of Transportation (Medical): Not on file  . Lack of Transportation (Non-Medical): Not on file  Physical Activity:   . Days of Exercise per Week: Not on file  . Minutes of Exercise per Session: Not on file  Stress:   . Feeling of Stress : Not on file  Social Connections:   . Frequency of Communication with Friends and Family: Not on file  . Frequency of Social Gatherings with Friends and Family: Not on file  . Attends Religious Services: Not on file  . Active Member of Clubs or Organizations: Not on file  . Attends Banker Meetings: Not on file  . Marital Status: Not on file   Additional Social History:    Pain Medications: see MAR Prescriptions: see MAR Over the Counter: see MAR History of alcohol / drug use?: Yes Longest period of sobriety (when/how long): unsure Negative Consequences of Use: Financial, Work / School Withdrawal Symptoms: Agitation, Irritability Name of Substance 1: alcohol 1 - Age of First Use: 34 yrs old Name of Substance 2: THC      Patient states he lives alone          Sleep: Increase daytime sedation  Appetite:  Fair  Current Medications: Current Facility-Administered Medications  Medication Dose Route Frequency Provider Last Rate Last Admin  . acetaminophen (TYLENOL) tablet 650 mg  650 mg Oral Q6H PRN Antonieta Pert, MD      . alum & mag hydroxide-simeth (MAALOX/MYLANTA) 200-200-20 MG/5ML suspension 30 mL  30 mL Oral Q4H PRN Antonieta Pert, MD      . hydrOXYzine (ATARAX/VISTARIL) tablet 25 mg  25 mg Oral TID PRN Antonieta Pert, MD   25 mg at 08/28/20 2109   . OLANZapine zydis (ZYPREXA) disintegrating tablet 10 mg  10 mg Oral Q8H PRN Antonieta Pert, MD       And  . LORazepam (ATIVAN) tablet 1 mg  1 mg Oral PRN Antonieta Pert, MD      . magnesium hydroxide (MILK OF  MAGNESIA) suspension 30 mL  30 mL Oral Daily PRN Antonieta Pert, MD      . nicotine polacrilex (NICORETTE) gum 2 mg  2 mg Oral PRN Antonieta Pert, MD   2 mg at 08/28/20 2151  . OLANZapine (ZYPREXA) injection 10 mg  10 mg Intramuscular Q6H PRN Antonieta Pert, MD      . pantoprazole (PROTONIX) EC tablet 40 mg  40 mg Oral Daily Antonieta Pert, MD   40 mg at 08/29/20 0807  . risperiDONE (RISPERDAL M-TABS) disintegrating tablet 2 mg  2 mg Oral QHS Antonieta Pert, MD   2 mg at 08/28/20 2109  . traZODone (DESYREL) tablet 50 mg  50 mg Oral QHS PRN Antonieta Pert, MD        Lab Results:  Results for orders placed or performed during the hospital encounter of 08/27/20 (from the past 48 hour(s))  Lipase, blood     Status: None   Collection Time: 08/27/20  1:15 PM  Result Value Ref Range   Lipase 29 11 - 51 U/L    Comment: Performed at [facility], [address] [place], [place] [address]  Acetaminophen level     Status: Abnormal   Collection Time: 08/27/20  2:32 PM  Result Value Ref Range   Acetaminophen (Tylenol), Serum <10 (L) 10 - 30 ug/mL    Comment: (NOTE) Therapeutic concentrations vary significantly. A range of 10-30 ug/mL  may be an effective concentration for many patients. However, some  are best treated at concentrations outside of this range. Acetaminophen concentrations >150 ug/mL at 4 hours after ingestion  and >50 ug/mL at 12 hours after ingestion are often associated with  toxic reactions.  Performed at [facility], [address] [place], [place] [address]   Acetaminophen level     Status: Abnormal   Collection Time: 08/27/20  5:28 PM  Result Value Ref Range   Acetaminophen (Tylenol), Serum <10 (L) 10 - 30  ug/mL    Comment: (NOTE) Therapeutic concentrations vary significantly. A range of 10-30 ug/mL  may be an effective concentration for many patients. However, some  are best treated at concentrations outside of this range. Acetaminophen concentrations >150 ug/mL at 4 hours after ingestion  and >50 ug/mL at 12 hours after ingestion are often associated with  toxic reactions.  Performed at [facility], [address] [place], [place] [address]   SARS Coronavirus 2 by RT PCR (hospital order, performed in [facility] hospital lab) Nasopharyngeal Nasopharyngeal Swab     Status: None   Collection Time: 08/28/20  4:45 AM   Specimen: Nasopharyngeal Swab  Result Value Ref Range   SARS Coronavirus 2 NEGATIVE NEGATIVE    Comment: (NOTE) SARS-CoV-2 target nucleic acids are NOT DETECTED.  The SARS-CoV-2 RNA is generally detectable in upper and lower respiratory specimens during the acute phase of infection. The lowest concentration of SARS-CoV-2 viral copies this assay can detect is 250 copies / mL. A negative result does not preclude SARS-CoV-2 infection and should not be used as the sole basis for treatment or other patient management decisions.  A negative result may occur with improper specimen collection / handling, submission of specimen other than nasopharyngeal swab, presence of viral mutation(s) within the areas targeted by this assay, and inadequate number of viral copies (<250 copies / mL). A negative result must be combined with clinical observations, patient history, and epidemiological information.  Fact Sheet for Patients:   BoilerBrush.com.cy  Fact Sheet for Healthcare Providers: https://pope.com/https://www.fda.gov/media/136313/download  This test is not yet approved or  cleared by the [place] FDA and has been authorized for detection and/or diagnosis of SARS-CoV-2 by FDA under an Emergency Use Authorization (EUA).  This EUA will remain in effect  (meaning this test can be used) for the duration of the COVID-19 declaration under Section 564(b)(1) of the Act, 21 U.S.C. section 360bbb-3(b)(1), unless the authorization is terminated or revoked sooner.  Performed at [facility], [address] [place], [place] [address]     Blood Alcohol level:  Lab Results  Component Value Date   ETH <10 08/27/2020   ETH 203 (H) 08/25/2020    Metabolic Disorder Labs: Lab Results  Component Value Date   HGBA1C 5.0 04/23/2016   Lab Results  Component Value Date   PROLACTIN 7.0 04/23/2016   Lab Results  Component Value Date   CHOL 193 04/23/2016   TRIG 94 04/23/2016   HDL 69 04/23/2016   CHOLHDL 2.8 04/23/2016   VLDL 19 04/23/2016   LDLCALC 105 (H) 04/23/2016    Physical Findings: AIMS: Facial and Oral Movements Muscles of Facial Expression: None, normal Lips and Perioral Area: None, normal Jaw: None, normal Tongue: None, normal,Extremity Movements Upper (arms, wrists, hands, fingers): None, normal Lower (legs, knees, ankles, toes): None, normal, Trunk Movements Neck, shoulders, hips: None, normal, Overall Severity Severity of abnormal movements (highest score from questions above): None, normal Incapacitation due to abnormal movements: None, normal Patient's awareness of abnormal movements (rate only patient's report): No Awareness, Dental Status Current problems with teeth and/or dentures?: Yes Does patient usually wear dentures?: No  CIWA:  CIWA-Ar Total: 11 COWS:  COWS Total Score: 2  Musculoskeletal: Strength & Muscle Tone: within normal limits Gait & Station: ataxic Patient leans: N/A  Psychiatric Specialty Exam: Physical Exam Constitutional:      General: He is not in acute distress. HENT:     Head: Normocephalic and atraumatic.  Cardiovascular:     Rate and Rhythm: Normal rate.  Pulmonary:     Effort: Pulmonary effort is normal. No respiratory distress.  Abdominal:     Tenderness: There is  abdominal tenderness (RUQ).  Musculoskeletal:        General: Normal range of motion.  Neurological:     General: No focal deficit present.     Mental Status: He is oriented to person, place, and time.     Review of Systems  Constitutional: Positive for activity change and appetite change.  Respiratory: Negative.   Cardiovascular: Negative.   Gastrointestinal: Positive for abdominal pain.  Psychiatric/Behavioral: Positive for agitation, dysphoric mood, sleep disturbance (excessive) and suicidal ideas. Negative for behavioral problems, confusion, decreased concentration, hallucinations and self-injury. The patient is not nervous/anxious and is not hyperactive.     Blood pressure 123/70, pulse 69, temperature 97.9 F (36.6 C), temperature source Oral, resp. rate 16, height 5\' 9"  (1.753 m), weight 63 kg, SpO2 98 %.Body mass index is 20.53 kg/m.  General Appearance: Disheveled  Eye Contact:  Poor  Speech:  Pressured  Volume:  Normal  Mood:  Anxious, Dysphoric and Irritable  Affect:  Congruent and Constricted  Thought Process:  Goal Directed and Descriptions of Associations: Intact  Orientation:  Full (Time, Place, and Person)  Thought Content:  Hallucinations: None  Suicidal Thoughts:  No  Homicidal Thoughts:  No  Memory:  Immediate;   Fair Recent;   Fair Remote;   Fair  Judgement:  Poor  Insight:  Shallow  Psychomotor Activity:  Psychomotor Retardation  Concentration:  Concentration: Fair and Attention Span: Fair  Recall:  [employer] of Knowledge:  Fair  Language:  Good  Akathisia:  No  Handed:  Right  AIMS (if indicated):     Assets:  Resilience  ADL's:  Intact  Cognition:  WNL  Sleep:       Patient has been sleeping all day, will reevaluate in the morning.  Treatment Plan Summary: Daily contact with patient to assess and evaluate symptoms and progress in treatment and Medication management   Continue Risperdal M 2 mg at bedtime for mood stabilization. Continue  Tylenol every 6 hours as needed for pain Continue Protonix 40 mg daily for reflux Continue nicotine gum to prevent nicotine withdrawal Continue Maalox and milk of magnesia for indigestion or constipation Agitation protocol in place. Continue trazodone 50 mg as needed at bedtime for sleep  Appreciate social work support and obtaining collateral for discharge planning.  Patient will likely need a work note.  Will set referral for outpatient surgical consultation regarding findings on abdominal ultrasound.   Mariel Craft, MD 08/29/2020, 12:41 PM

## 2020-08-29 NOTE — Progress Notes (Signed)
   08/29/20 1300  Psych Admission Type (Psych Patients Only)  Admission Status Involuntary  Psychosocial Assessment  Patient Complaints Depression  Eye Contact Brief  Facial Expression Anxious;Sad;Worried  Affect Sad;Anxious;Apprehensive;Depressed;Irritable  Speech Argumentative  Interaction Assertive  Motor Activity Other (Comment) (appropriate)  Appearance/Hygiene Disheveled  Behavior Characteristics Appropriate to situation  Mood Depressed  Thought Process  Coherency WDL  Content WDL  Delusions None reported or observed  Perception WDL  Hallucination None reported or observed  Judgment Impaired  Confusion None  Danger to Self  Current suicidal ideation? Denies  Self-Injurious Behavior No self-injurious ideation or behavior indicators observed or expressed   Agreement Not to Harm Self Yes  Description of Agreement contracts for safety verbal  Danger to Others  Danger to Others None reported or observed  Dar Note: Patient presents with a flat affect and depressed mood.  Denies suicidal thoughts, auditory and visual hallucinations.  Routine safety checks maintained every 15 minutes.  Remained in bed sleeping for majority of the day.  Patient off the unit with staff to [facility] for RUQ abdominal ultrasound.

## 2020-08-29 NOTE — Progress Notes (Signed)
Pt a little angry on the unit , but appropriate. Pt making progress with his anger. Per Dr Viviano Simas pt Risperdal changed to 3 mg nightly and Vistaril 50 mg HS with repeat for sleep due to pt issues with Trazodone.     08/29/20 2100  Psych Admission Type (Psych Patients Only)  Admission Status Involuntary  Psychosocial Assessment  Patient Complaints Depression;Anxiety  Eye Contact Brief  Facial Expression Anxious;Sad;Worried  Affect Sad;Anxious;Apprehensive;Depressed;Irritable  Speech Argumentative  Interaction Assertive  Motor Activity Other (Comment) (appropriate)  Appearance/Hygiene Disheveled  Behavior Characteristics Anxious  Mood Depressed  Thought Process  Coherency WDL  Content WDL  Delusions None reported or observed  Perception WDL  Hallucination None reported or observed  Judgment Impaired  Confusion None  Danger to Self  Current suicidal ideation? Denies  Self-Injurious Behavior No self-injurious ideation or behavior indicators observed or expressed   Agreement Not to Harm Self Yes  Description of Agreement contracts for safety verbal  Danger to Others  Danger to Others None reported or observed

## 2020-08-29 NOTE — Progress Notes (Signed)
Cooperative with treatment. He spent most of the evening in the dayroom talking with peers. He endorses depression, but denies AH/VH, or any suicidal ideation. He remains anxious on approach He had no behavioral issues to report on shift at this time. He was compliant with medications on shift. He is currently in bed resting quietly.

## 2020-08-30 MED ORDER — RISPERIDONE 3 MG PO TABS
3.0000 mg | ORAL_TABLET | Freq: Every day | ORAL | Status: DC
  Administered 2020-08-30: 3 mg via ORAL
  Filled 2020-08-30 (×3): qty 1

## 2020-08-30 MED ORDER — HYDROXYZINE HCL 50 MG PO TABS
50.0000 mg | ORAL_TABLET | Freq: Every evening | ORAL | Status: DC | PRN
  Administered 2020-08-30: 50 mg via ORAL
  Filled 2020-08-30 (×6): qty 1

## 2020-08-30 NOTE — [facility] Group Notes (Signed)
LCSW Group Therapy Note  08/30/2020   10:00-11:00am   Type of Therapy and Topic:  Group Therapy: Anger Cues and Responses  Participation Level:  Active   Description of Group:   In this group, patients learned how to recognize the physical, cognitive, emotional, and behavioral responses they have to anger-provoking situations.  They identified a recent time they became angry and how they reacted.  They analyzed how their reaction was possibly beneficial and how it was possibly unhelpful.  The group discussed a variety of healthier coping skills that could help with such a situation in the future.  Focus was placed on how helpful it is to recognize the underlying emotions to our anger, because working on those can lead to a more permanent solution as well as our ability to focus on the important rather than the urgent.  Therapeutic Goals: 1. Patients will remember their last incident of anger and how they felt emotionally and physically, what their thoughts were at the time, and how they behaved. 2. Patients will identify how their behavior at that time worked for them, as well as how it worked against them. 3. Patients will explore possible new behaviors to use in future anger situations. 4. Patients will learn that anger itself is normal and cannot be eliminated, and that healthier reactions can assist with resolving conflict rather than worsening situations.  . Summary of Patient Progress:  The patient recalled how his inability to effectively manage his anger has caused him great problems.These include jail, prison and hospitalizations.  The patient was provided with the following information:  . That anger is a natural part of human life.  . That people can acquire effective coping skills and work toward having positive outcomes.  . The patient now understands that there emotional and physical cues associated with anger and that these can be used as warning signs alert them to step-back,  regroup and use a coping skill.  . Patient was encouraged to work on managing anger more effectively.   Therapeutic Modalities:   Cognitive Behavioral Therapy  Evorn Gong

## 2020-08-30 NOTE — [facility] Group Notes (Signed)
.  Psychoeducational Group Note    Date: 08-30-20 Time: 0900    Goal Setting and Orientation Group  Purpose of Group: To be able to set a goal that is measurable and that can be accomplished in one day. Pt will also be oriented to the unit rules and to the schedule of the day.  Participation Level:  Active  Participation Quality:  Appropriate  Affect:  Appropriate  Cognitive:  Appropriate  Insight:  Improving  Engagement in Group:  Engaged  Additional Comments:  Pt rates his energy at a 2/10. States his goal today is to "hold my anger".  Dione Housekeeper

## 2020-08-30 NOTE — Progress Notes (Signed)
Adult Psychoeducational Group Note  Date:  08/30/2020 Time:  5:39 AM  Group Topic/Focus:  Wrap-Up Group:   The focus of this group is to help patients review their daily goal of treatment and discuss progress on daily workbooks.  Participation Level:  Minimal  Participation Quality:  Appropriate  Affect:  Anxious, Appropriate and Excited  Cognitive:  Disorganized  Insight: Limited  Engagement in Group:  Limited  Modes of Intervention:  Discussion  Additional Comments:  Pt stated his goal for today was to focus on his treatment plan. Pt stated he accomplished his goal today. Pt stated he was able to talk with his doctor and social worker about his care today. Pt rated his overall day a 5 out of 10. Pt stated the reason his day was a 5 was he was sent over to the [facility] to have his Liver check out by a doctor and he had to wait over 5 hours to be seen by a doctor. Pt stated his relationship with his family and support system needs to be improved. Pt stated been able to contact his son improved his overall day. Pt stated he was not able to attend all meals because he is on unit restriction but staff brought back all meals for him. Pt stated he took all medications provided today. Pt stated he felt better about himself today.  Pt stated his appetite had was pretty good  today. Pt rated sleep last night was pretty good. Pt stated he was having some physical pain today. Writer asked pt what was his pain level and where? Pt stated his pain level was a 7 and he was having pain in his liver. Pt nurse was made aware of the situation. Pt deny auditory or visual hallucinations. Pt denies thoughts of harming himself or others. Pt stated he would alert staff if anything changes.  Felipa Furnace 08/30/2020, 5:39 AM

## 2020-08-30 NOTE — Progress Notes (Signed)
DAR NOTE: Patient presents with calm affect and pleasant mood.  Denies pain, auditory and visual hallucinations.  Rates depression at 9, hopelessness at 3, and anxiety at 10.  Maintained on routine safety checks.  Medications given as prescribed.  Support and encouragement offered as needed.  Attended group and participated.  States goal for today is 10.  Patient observed socializing with peers in the dayroom.  Patient is safe on and off the unit.

## 2020-08-30 NOTE — Progress Notes (Signed)
[facility] Progress [facility]  08/30/2020 1:11 PM Nathan [name] Nathan [name]  MRN:  [id number] ]  Subjective: "The medicine that I am taking here seems to really be helping."  Principal Problem: Bipolar 1 disorder, mixed, severe (HCC) Diagnosis: Principal Problem:   Bipolar 1 disorder, mixed, severe (HCC) Active Problems:   Substance or medication-induced anxiety disorder (HCC)   Tobacco use disorder   History of gastroesophageal reflux (GERD)   Intentional overdose of drug in tablet form (HCC)   Moderate alcohol use disorder (HCC)  Total Time spent with patient: 35 minutes   History of Present Illness on intake: Patient is seen and examined. Patient is a 34 year old male with a past psychiatric history significant for alcohol dependence who originally presented to the [facility] emergency department on 08/25/2020 with suicidal ideation. He reported a history of bipolar disorder, alcohol abuse, polysubstance abuse and hepatitis C who presented to the emergency room for evaluation. The patient stated "I have been doing so well then I messed up". He stated he had been drinking the evening of admission was having thoughts of killing himself. He would not elaborate on any plan. He admitted to marijuana use but denied any other drug use. His medications on admission at that time were ibuprofen and omeprazole. His laboratories at that time showed a normal AST of 45 and an ALT of 48. His acetaminophen level was less than 10. His blood alcohol at that time was 203. Drug screen was positive for cannabinoids. Nursing notes on 08/25/2020 showed that he denied suicidal ideation. He was having homicidal ideation but would not tell the writer who he was having these thoughts about. He denied any auditory or visual hallucinations. Somehow or another there was some admission of suicidal ideation, and he was placed under involuntary commitment by the emergency room doctor. He had a behavioral  health assessment and that he apparently additional information showed that the police had brought him in. He was cursing in triage. He was aggressive in triage. He was taken in by officers. The patient admitted at that time that he had become upset after his uncle kicked him out of the home for no reason. He had been quoted as saying I have been doing everything I am supposed to do in got put out on the streets for no reason". He reported being homeless and in need of a place to go. He admitted to homicidal ideation towards his uncle, but denied any current homicidal ideation. He had recently started a job at [employer]. He stated today he had been working there for 2 weeks. He stated he had tried local programs like [facility] and [facility] but they made him "worse not better". He has had multiple diagnoses in the past including alcohol dependence, depression and personality disorder. He stated today that he taken medications all the way from "Prozac to Zoloft". Later on 08/26/2020 the patient wanted to go home post intoxication. He contracted for safety and was not actively suicidal or homicidal. He was discharged home. On 08/27/2020 he returned to the emergency room and stated that he had overdosed on 30 Tylenol. He was complaining of right upper quadrant pain, nausea and fatigue. He stated that that time he was suicidal and it was a suicide attempt. On 9/8 his AST was 45 and ALT was 47. His acetaminophen level was less than 10. He was seen again by psychiatry and stated that he had been overdosed on Tylenol. His evaluation stated that he was vague on  motives, but felt depressed, hopeless, helpless and his lack of energy. He was transferred to our facility for evaluation and stabilization. His main complaint today is right upper quadrant pain. He stated he needed to get some rest. He was irritable.  Associated Signs/Symptoms: Depression Symptoms:  anhedonia, insomnia, fatigue, suicidal thoughts  with specific plan, disturbed sleep, (Hypo) Manic Symptoms:  Impulsivity, Irritable Mood, Lability of Mood, Anxiety Symptoms:  Excessive Worry, Psychotic Symptoms:  denied PTSD Symptoms: Negative  Past Psychiatric History: Patient has a longstanding history of alcohol dependence as well as antisocial personality disorder.  It looks as though he has had 1 or 2 psychiatric admissions at [facility] in the past.  His most recent rehab facility stay was within the last 4 weeks or so.  He felt as though going to rehab was "made me worse not better".  He states he has been treated for "bipolar disorder".  He stated he has been treated with "everything from Prozac to Zoloft".  Review of the electronic medical record revealed no psychiatric medications from his multiple ER visits at [facility].   08/29/2020: Patient has been isolating to her room.  He has been urged to attend groups, but does not come out with prompting .  He states however, that his medication is making him too sedated.  He also has complaints of right upper quadrant abdominal pain that he feels has not been addressed.  He states that he initially went to the emergency department for evaluation of this pain, however he made a comment that the pain was so severe that he would rather be dead.  He continues to endorse suicidal ideation, however he also states that he is also in a custody battle to ensure that he is able to continue seeing his 33-year-old son.  He does not specify plan or intent of suicide at this time.  Patient states his goal for hospitalization is to get back on medicine, keep himself safe, and slow tapering down.  Patient denies any homicidal ideation.  He states that he lives alone.  He is denying any auditory or visual hallucinations today.  He states that he does have a history of bipolar disorder, but has not been on medications.  Patient has been compliant with medications while hospitalized, however  reports oversedation.  We will continue to monitor.  Patient had an abdominal ultrasound completed on 08/29/2020: Findings revealed a contracted gallbladder with the right upper quadrant transducer tenderness but no shadowing suspicion for gallbladder stones.  There is borderline wall thickening of the gallbladder. Liver normal appearing, without any lesions, and normal blood flow.  08/30/2020: Patient is seen out and active through the milieu during the day.  He has been interactive in groups.  His mood is pleasant, and he states good effect from medication without side effects.  He states that he sleeps well and his appetite is good.  He states that he is able to have reasonable conversations with his ex.  Reviewed results of ultrasound as above.  Patient is relieved that his liver was normal in appearance.  Reviewed findings on gallbladder and encouraged him to follow-up with outpatient primary care or surgeon.  Patient is not complaining of abdominal pain today.  Reviewed Risperdal as antipsychotic medication which can help in alleviating anxiety and insomnia.  Patient states he would like to continue this medication.  Reviewed how cigarette smoking can decrease the effectiveness of antipsychotics.  Patient does not wish to quit smoking at this time,  but is made aware that should the Risperdal seem less effective, he should discuss this with his outpatient psychiatrist.  He asks about getting insurance as well as outpatient follow-up.  Patient does not have a vehicle, but can get transportation to [facility] for mental health care.  He is advised to go to the [facility] office on Monday to discuss eligibility.  Patient is denying SI/HI/AVH today.  He is looking forward to returning to work, and requests a work note to excuse him while he has been in the hospital.  He continues to be hopeful that he can have a relationship with his 5-year-old son, despite being in a contentious custody hearing  currently.    Past Medical History:  Past Medical History:  Diagnosis Date  . Anxiety   . Bipolar 1 disorder (HCC)   . Chronic headache   . Depression 03/30/2020  . ETOH abuse 03/30/2020  . 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 Horseshoe kidney   . Marijuana smoker 03/30/2020    Past Surgical History:  Procedure Laterality Date  . TONSILLECTOMY     Family History:  Family History  Problem Relation Age of Onset  . Hypertension Mother   . Diabetes Mother   . Hypertension Father   . Heart failure Father   . Diabetes Father    Family Psychiatric  History: Denied  Social History:  Social History   Substance and Sexual Activity  Alcohol Use Yes  . Alcohol/week: 24.0 standard drinks  . Types: 24 Cans of beer per week   Comment: 24 beers weekly     Social History   Substance and Sexual Activity  Drug Use Yes  . Types: IV, Marijuana   Comment: 2-3 joints weekly    Social History   Socioeconomic History  . Marital status: Single    Spouse name: Not on file  . Number of children: 0  . Years of education: 40  . Highest education level: Not on file  Occupational History  . Not on file  Tobacco Use  . Smoking status: Current Every Day Smoker    Packs/day: 1.00    Types: Cigarettes  . Smokeless tobacco: Current User    Types: Chew  Vaping Use  . Vaping Use: Never used  Substance and Sexual Activity  . Alcohol use: Yes    Alcohol/week: 24.0 standard drinks    Types: 24 Cans of beer per week    Comment: 24 beers weekly  . Drug use: Yes    Types: IV, Marijuana    Comment: 2-3 joints weekly  . Sexual activity: Not Currently  Other Topics Concern  . Not on file  Social History Narrative  . Not on file   Social Determinants of Health   Financial Resource Strain:   . Difficulty of Paying Living Expenses: Not on file  Food Insecurity:   . Worried About Programme researcher, broadcasting/film/video in the Last Year: Not on file  . Ran Out of Food in the Last Year: Not on file   Transportation Needs:   . Lack of Transportation (Medical): Not on file  . Lack of Transportation (Non-Medical): Not on file  Physical Activity:   . Days of Exercise per Week: Not on file  . Minutes of Exercise per Session: Not on file  Stress:   . Feeling of Stress : Not on file  Social Connections:   . Frequency of Communication with Friends and Family: Not on file  . Frequency of Social  Gatherings with Friends and Family: Not on file  . Attends Religious Services: Not on file  . Active Member of Clubs or Organizations: Not on file  . Attends Banker Meetings: Not on file  . Marital Status: Not on file   Additional Social History:    Pain Medications: see MAR Prescriptions: see MAR Over the Counter: see MAR History of alcohol / drug use?: Yes Longest period of sobriety (when/how long): unsure Negative Consequences of Use: Financial, Work / School Withdrawal Symptoms: Agitation, Irritability Name of Substance 1: alcohol 1 - Age of First Use: 34 yrs old Name of Substance 2: THC      Patient states he lives alone          Sleep: Increase daytime sedation  Appetite:  Fair  Current Medications: Current Facility-Administered Medications  Medication Dose Route Frequency Provider Last Rate Last Admin  . acetaminophen (TYLENOL) tablet 650 mg  650 mg Oral Q6H PRN Antonieta Pert, MD      . alum & mag hydroxide-simeth (MAALOX/MYLANTA) 200-200-20 MG/5ML suspension 30 mL  30 mL Oral Q4H PRN Antonieta Pert, MD      . hydrOXYzine (ATARAX/VISTARIL) tablet 25 mg  25 mg Oral TID PRN Antonieta Pert, MD   25 mg at 08/28/20 2109  . hydrOXYzine (ATARAX/VISTARIL) tablet 50 mg  50 mg Oral QHS,MR X 1 Mariel Craft, MD      . magnesium hydroxide (MILK OF MAGNESIA) suspension 30 mL  30 mL Oral Daily PRN Antonieta Pert, MD      . nicotine polacrilex (NICORETTE) gum 2 mg  2 mg Oral PRN Antonieta Pert, MD   2 mg at 08/30/20 1208  . OLANZapine (ZYPREXA)  injection 10 mg  10 mg Intramuscular Q6H PRN Antonieta Pert, MD      . OLANZapine zydis [facility]) disintegrating tablet 10 mg  10 mg Oral Q8H PRN Antonieta Pert, MD      . pantoprazole (PROTONIX) EC tablet 40 mg  40 mg Oral Daily Antonieta Pert, MD   40 mg at 08/30/20 0629  . risperiDONE (RISPERDAL) tablet 3 mg  3 mg Oral QHS Mariel Craft, MD        Lab Results:  No results found for this or any previous visit (from the past 48 hour(s)).  Blood Alcohol level:  Lab Results  Component Value Date   ETH <10 08/27/2020   ETH 203 (H) 08/25/2020    Metabolic Disorder Labs: Lab Results  Component Value Date   HGBA1C 5.0 04/23/2016   Lab Results  Component Value Date   PROLACTIN 7.0 04/23/2016   Lab Results  Component Value Date   CHOL 193 04/23/2016   TRIG 94 04/23/2016   HDL 69 04/23/2016   CHOLHDL 2.8 04/23/2016   VLDL 19 04/23/2016   LDLCALC 105 (H) 04/23/2016    Physical Findings: AIMS: Facial and Oral Movements Muscles of Facial Expression: None, normal Lips and Perioral Area: None, normal Jaw: None, normal Tongue: None, normal,Extremity Movements Upper (arms, wrists, hands, fingers): None, normal Lower (legs, knees, ankles, toes): None, normal, Trunk Movements Neck, shoulders, hips: None, normal, Overall Severity Severity of abnormal movements (highest score from questions above): None, normal Incapacitation due to abnormal movements: None, normal Patient's awareness of abnormal movements (rate only patient's report): No Awareness, Dental Status Current problems with teeth and/or dentures?: Yes Does patient usually wear dentures?: No  CIWA:  CIWA-Ar Total: 11 COWS:  COWS Total Score:  2  Musculoskeletal: Strength & Muscle Tone: within normal limits Gait & Station: ataxic Patient leans: N/A  Psychiatric Specialty Exam: Physical Exam Constitutional:      General: He is not in acute distress. HENT:     Head: Normocephalic and atraumatic.   Cardiovascular:     Rate and Rhythm: Normal rate.  Pulmonary:     Effort: Pulmonary effort is normal. No respiratory distress.  Abdominal:     Tenderness: There is no abdominal tenderness (RUQ).  Musculoskeletal:        General: Normal range of motion.  Neurological:     General: No focal deficit present.     Mental Status: He is oriented to person, place, and time.     Review of Systems  Constitutional: Negative.   HENT: Negative.   Respiratory: Negative.   Cardiovascular: Negative.   Gastrointestinal: Negative for abdominal pain.  Musculoskeletal: Negative.   Psychiatric/Behavioral: Negative for agitation, behavioral problems, confusion, decreased concentration, dysphoric mood, hallucinations, self-injury, sleep disturbance and suicidal ideas. The patient is not nervous/anxious and is not hyperactive.     Blood pressure 114/83, pulse 99, temperature 97.9 F (36.6 C), temperature source Oral, resp. rate 18, height 5\' 9"  (1.753 m), weight 63 kg, SpO2 98 %.Body mass index is 20.53 kg/m.  General Appearance: Neat  Eye Contact:  Good  Speech:  Clear and Coherent and Normal Rate  Volume:  Normal  Mood:  Euthymic  Affect:  Appropriate and Congruent  Thought Process:  Goal Directed and Descriptions of Associations: Intact  Orientation:  Full (Time, Place, and Person)  Thought Content:  Hallucinations: None  Suicidal Thoughts:  No  Homicidal Thoughts:  No  Memory:  Immediate;   Fair Recent;   Fair Remote;   Fair  Judgement:  Fair  Insight:  Fair and Shallow  Psychomotor Activity:  Normal  Concentration:  Concentration: Fair and Attention Span: Fair  Recall:  of Knowledge:  Fair  Language:  Good  Akathisia:  No  Handed:  Right  AIMS (if indicated):     Assets:  Communication Skills Desire for Improvement Financial Resources/Insurance Housing Resilience Vocational/Educational  ADL's:  Intact  Cognition:  WNL  Sleep:  Number of Hours: 6.5    Patient has  been sleeping all day, will reevaluate in the morning.  Treatment Plan Summary: Daily contact with patient to assess and evaluate symptoms and progress in treatment and Medication management    Change Risperdal M tab to Risperdal 2 mg at bedtime for mood stabilization.  (Patient to evaluate if Risperdal M tab is more effective for him) Continue Tylenol every 6 hours as needed for pain Continue Protonix 40 mg daily for reflux Continue nicotine gum to prevent nicotine withdrawal Continue Maalox and milk of magnesia for indigestion or constipation Agitation protocol in place. Discontinue trazodone 50 mg as needed at bedtime for sleep, as patient does not wish to be oversedated.  Appreciate social work support and obtaining collateral for discharge planning.   Patient requests a work note.  Recommend referral for outpatient surgical consultation regarding findings on abdominal ultrasound.   [employer], MD 08/30/2020, 1:11 PM

## 2020-08-31 DIAGNOSIS — F1220 Cannabis dependence, uncomplicated: Secondary | ICD-10-CM

## 2020-08-31 DIAGNOSIS — R45851 Suicidal ideations: Secondary | ICD-10-CM

## 2020-08-31 DIAGNOSIS — R109 Unspecified abdominal pain: Secondary | ICD-10-CM

## 2020-08-31 DIAGNOSIS — R1011 Right upper quadrant pain: Secondary | ICD-10-CM

## 2020-08-31 MED ORDER — HYDROXYZINE HCL 50 MG PO TABS
50.0000 mg | ORAL_TABLET | Freq: Every day | ORAL | Status: DC
  Filled 2020-08-31: qty 7

## 2020-08-31 MED ORDER — PANTOPRAZOLE SODIUM 40 MG PO TBEC
40.0000 mg | DELAYED_RELEASE_TABLET | Freq: Every day | ORAL | 0 refills | Status: DC
Start: 2020-09-01 — End: 2021-02-09

## 2020-08-31 MED ORDER — NICOTINE POLACRILEX 2 MG MT GUM
2.0000 mg | CHEWING_GUM | OROMUCOSAL | 0 refills | Status: DC | PRN
Start: 2020-08-31 — End: 2021-02-09

## 2020-08-31 MED ORDER — HYDROXYZINE HCL 50 MG PO TABS
50.0000 mg | ORAL_TABLET | Freq: Every evening | ORAL | 0 refills | Status: DC
Start: 2020-08-31 — End: 2021-02-09

## 2020-08-31 MED ORDER — RISPERIDONE 3 MG PO TABS
3.0000 mg | ORAL_TABLET | Freq: Every day | ORAL | 0 refills | Status: DC
Start: 2020-08-31 — End: 2021-02-09

## 2020-08-31 MED ORDER — HYDROXYZINE HCL 25 MG PO TABS
25.0000 mg | ORAL_TABLET | Freq: Three times a day (TID) | ORAL | 0 refills | Status: DC | PRN
Start: 2020-08-31 — End: 2021-02-09

## 2020-08-31 NOTE — Progress Notes (Signed)
DAR NOTE: Patient presents with bright affect and pleasant mood.  Denies pain, auditory and visual hallucinations. Maintained on routine safety checks.  Medications given as prescribed.  Support and encouragement offered as needed.  Will continue to monitor.

## 2020-08-31 NOTE — [facility] Suicide Risk Assessment (Signed)
[facility] Discharge Suicide Risk Assessment   Principal Problem: Bipolar 1 disorder, mixed, severe (HCC) Discharge Diagnoses: Principal Problem:   Bipolar 1 disorder, mixed, severe (HCC) Active Problems:   Substance or medication-induced anxiety disorder (HCC)   Tobacco use disorder   History of gastroesophageal reflux (GERD)   Moderate alcohol use disorder (HCC)   Total Time spent with patient: 35 min  Musculoskeletal: Strength & Muscle Tone: within normal limits Gait & Station: normal Patient leans: Right  Psychiatric Specialty Exam: Review of Systems  Constitutional: Negative.   HENT: Negative.   Respiratory: Negative.   Cardiovascular: Negative.   Gastrointestinal: Negative.   Musculoskeletal: Negative.   Neurological: Negative.   Psychiatric/Behavioral: Negative for agitation, behavioral problems, confusion, decreased concentration, dysphoric mood, hallucinations, self-injury, sleep disturbance and suicidal ideas. The patient is not nervous/anxious and is not hyperactive.     Blood pressure 116/87, pulse 98, temperature 97.7 F (36.5 C), temperature source Oral, resp. rate 18, height 5\' 9"  (1.753 m), weight 63 kg, SpO2 98 %.Body mass index is 20.53 kg/m.  General Appearance: Casual and Neat  Eye Contact::  Good  Speech:  Clear and Coherent and Normal Rate409  Volume:  Normal  Mood:  Euthymic  Affect:  Appropriate, Congruent and Full Range  Thought Process:  Coherent, Goal Directed, Linear and Descriptions of Associations: Intact  Orientation:  Full (Time, Place, and Person)  Thought Content:  Logical and Hallucinations: None  Suicidal Thoughts:  No  Homicidal Thoughts:  No  Memory:  Immediate;   Good Recent;   Good Remote;   Good  Judgement:  Fair  Insight:  Fair  Psychomotor Activity:  Normal  Concentration:  Good  Recall:  Good  Fund of Knowledge:Good  Language: Good  Akathisia:  No  Handed:  Right  AIMS (if indicated):     Assets:  Communication  Skills Desire for Improvement Financial Resources/Insurance Housing Physical Health Resilience Vocational/Educational  Sleep:  Number of Hours: 6.75  Cognition: WNL  ADL's:  Intact   Mental Status Per Nursing Assessment::   On Admission:  Suicidal ideation indicated by patient, Self-harm behaviors  Demographic Factors:  Male, Caucasian and Living alone  Loss Factors: in custody dispute for 76 year old son  Historical Factors: Impulsivity  Risk Reduction Factors:   Responsible for children under 20 years of age, Sense of responsibility to family, Employed, Positive social support and Positive coping skills or problem solving skills  Continued Clinical Symptoms:  Bipolar Disorder Alcohol/Substance Abuse/Dependencies  Cognitive Features That Contribute To Risk:  None    Suicide Risk:  Minimal: No identifiable suicidal ideation.  Patients presenting with no risk factors but with morbid ruminations; may be classified as minimal risk based on the severity of the depressive symptoms    Plan Of Care/Follow-up recommendations:  Activity:  ad lib Diet:  as tolerated Other:  discharge instructions as per AVS   On day of discharge following sustained improvement in the affect of this patient, continued report of euthymic mood, repeated denial of suicidal, homicidal, and other violent ideation, adequate interaction with peers, active participation in groups while on the unit, and denial of adverse reactions from medications, the treatment team decided [facility] was stable for discharge home with scheduled mental health treatment as noted below.  He was able to engage in safety planning including plan to return to [facility] or contact emergency services if he feels unable to maintain his own safety or the safety of others. Patient had no further questions,  comments, or concerns. Discharge into care of self.  Patient aware to return to nearest crisis center, ED or to call 911 for  worsening symptoms of depression, suicidal or homicidal thoughts or AVH.    Mariel Craft, MD 08/31/2020, 12:24 PM

## 2020-08-31 NOTE — Plan of Care (Signed)
Discharge note  Patient verbalizes readiness for discharge. Follow up plan explained, AVS, Transition record and SRA given. Prescriptions and teaching provided. Belongings returned and signed for. Suicide safety plan completed and signed. Patient verbalizes understanding. Patient denies SI/HI and assures this Clinical research associate they will seek assistance should that change. Patient discharged to lobby to wait for safe transport.  Problem: Education: Goal: Knowledge of [facility] Education information/materials will improve Outcome: Adequate for Discharge Goal: Emotional status will improve Outcome: Adequate for Discharge Goal: Mental status will improve Outcome: Adequate for Discharge Goal: Verbalization of understanding the information provided will improve Outcome: Adequate for Discharge   Problem: Education: Goal: Utilization of techniques to improve thought processes will improve Outcome: Adequate for Discharge Goal: Knowledge of the prescribed therapeutic regimen will improve Outcome: Adequate for Discharge   Problem: Education: Goal: Ability to state activities that reduce stress will improve Outcome: Adequate for Discharge   Problem: Education: Goal: Knowledge of disease or condition will improve Outcome: Adequate for Discharge Goal: Understanding of discharge needs will improve Outcome: Adequate for Discharge   Problem: Education: Goal: Ability to make informed decisions regarding treatment will improve Outcome: Adequate for Discharge

## 2020-08-31 NOTE — Discharge Summary (Signed)
Physician Discharge Summary Note  Patient:  Nathan [name] is an 34 y.o., male MRN:  [id number] DOB:  [birth year] Patient phone:  [phone] (home)  Patient address:   [address] [place] [address],  Total Time spent with patient: 35 minutes  Date of Admission:  08/28/2020 Date of Discharge: 08/31/2020  Reason for Admission: Patient is a 34 year old male with a past psychiatric history significant for alcohol dependence who originally presented to the [facility] emergency department on 08/25/2020 with suicidal ideation. He reported a history of bipolar disorder, alcohol abuse, polysubstance abuse and hepatitis C who presented to the emergency room for evaluation. The patient stated "I have been doing so well then I messed up". He stated he had been drinking the evening of admission was having thoughts of killing himself. He would not elaborate on any plan. He admitted to marijuana use but denied any other drug use. He was admitted in unit for stabilization and further evaluation.  Principal Problem: Bipolar 1 disorder, mixed, severe (HCC) Discharge Diagnoses: Principal Problem:   Bipolar 1 disorder, mixed, severe (HCC) Active Problems:   Substance or medication-induced anxiety disorder (HCC)   Tobacco use disorder   History of gastroesophageal reflux (GERD)   Moderate alcohol use disorder (HCC)   Past Psychiatric History: Patient has a longstanding history of alcohol dependence as well as antisocial personality disorder.  It looks as though he has had 1 or 2 psychiatric admissions at [facility] in the past.  His most recent rehab facility stay was within the last 4 weeks or so.  He felt as though going to rehab was "made me worse not better".  He states he has been treated for "bipolar disorder".  He stated he has been treated with "everything from Prozac to Zoloft".  Review of the electronic medical record revealed no  psychiatric medications from his multiple ER visits at [facility].  Past Medical History:  Past Medical History:  Diagnosis Date  . Anxiety   . Bipolar 1 disorder (HCC)   . Chronic headache   . Depression 03/30/2020  . ETOH abuse 03/30/2020  . 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 Horseshoe kidney   . Marijuana smoker 03/30/2020    Past Surgical History:  Procedure Laterality Date  . TONSILLECTOMY     Family History:  Family History  Problem Relation Age of Onset  . Hypertension Mother   . Diabetes Mother   . Hypertension Father   . Heart failure Father   . Diabetes Father    Family Psychiatric  History: Denied Social History:  Social History   Substance and Sexual Activity  Alcohol Use Yes  . Alcohol/week: 24.0 standard drinks  . Types: 24 Cans of beer per week   Comment: 24 beers weekly     Social History   Substance and Sexual Activity  Drug Use Yes  . Types: IV, Marijuana   Comment: 2-3 joints weekly    Social History   Socioeconomic History  . Marital status: Single    Spouse name: Not on file  . Number of children: 0  . Years of education: 71  . Highest education level: Not on file  Occupational History  . Not on file  Tobacco Use  . Smoking status: Current Every Day Smoker    Packs/day: 1.00    Types: Cigarettes  . Smokeless tobacco: Current User    Types: Chew  Vaping Use  . Vaping Use: Never used  Substance and Sexual Activity  . Alcohol use: Yes    Alcohol/week: 24.0 standard drinks    Types: 24 Cans of beer per week    Comment: 24 beers weekly  . Drug use: Yes    Types: IV, Marijuana    Comment: 2-3 joints weekly  . Sexual activity: Not Currently  Other Topics Concern  . Not on file  Social History Narrative  . Not on file   Social Determinants of Health   Financial Resource Strain:   . Difficulty of Paying Living Expenses: Not on file  Food Insecurity:   . Worried About Programme researcher, broadcasting/film/videounning Out of Food in the Last Year: Not on file  . Ran Out  of Food in the Last Year: Not on file  Transportation Needs:   . Lack of Transportation (Medical): Not on file  . Lack of Transportation (Non-Medical): Not on file  Physical Activity:   . Days of Exercise per Week: Not on file  . Minutes of Exercise per Session: Not on file  Stress:   . Feeling of Stress : Not on file  Social Connections:   . Frequency of Communication with Friends and Family: Not on file  . Frequency of Social Gatherings with Friends and Family: Not on file  . Attends Religious Services: Not on file  . Active Member of Clubs or Organizations: Not on file  . Attends BankerClub or Organization Meetings: Not on file  . Marital Status: Not on file    Hospital Course: Patient was started on Risperdal M 2 mg at bedtime for mood stabilization, Tylenol every 6 hours as needed for pain, Protonix 40 mg daily for reflux, nicotine gum to prevent nicotine withdrawal, Maalox and milk of magnesia for indigestion or constipation, trazodone 50 mg as needed at bedtime for sleep Agitation protocol in place.  Later on trazodone 50 mg was discontinued as patient felt oversedated and Risperdal was increased to 3 mg.  On the day of discharge patient denies suicidal ideations, homicidal ideations, auditory or visual hallucinations. He was extremely thankful to the provider and states he is going to have good relationship with his son and thanks for taking good care of him. He is leaving the unit in good spirits. He was provided with printed prescription and 7 days of sample medications from [facility] pharmacy and social workers arranged for his transport to home.  Discharge medications:  1. Vistaril 25 mg 2.Nicotine 2 mg gum 3.Protonix 40 mg 4.Risperdal 3 mg  Physical Findings: AIMS: Facial and Oral Movements Muscles of Facial Expression: None, normal Lips and Perioral Area: None, normal Jaw: None, normal Tongue: None, normal,Extremity Movements Upper (arms, wrists, hands, fingers): None,  normal Lower (legs, knees, ankles, toes): None, normal, Trunk Movements Neck, shoulders, hips: None, normal, Overall Severity Severity of abnormal movements (highest score from questions above): None, normal Incapacitation due to abnormal movements: None, normal Patient's awareness of abnormal movements (rate only patient's report): No Awareness, Dental Status Current problems with teeth and/or dentures?: Yes Does patient usually wear dentures?: No  CIWA:  CIWA-Ar Total: 11 COWS:  COWS Total Score: 2  Musculoskeletal: Strength & Muscle Tone: within normal limits Gait & Station: normal Patient leans: N/A  Psychiatric Specialty Exam: Physical Exam Constitutional:      General: He is not in acute distress. HENT:     Head: Normocephalic and atraumatic.  Cardiovascular:     Rate and Rhythm: Normal rate.  Pulmonary:     Effort: Pulmonary effort is normal. No respiratory  distress.  Abdominal:     Tenderness: There is no abdominal tenderness (RUQ).  Musculoskeletal:        General: Normal range of motion.  Neurological:     General: No focal deficit present.     Mental Status: He is oriented to person, place, and time.     Review of Systems  Constitutional: Negative.   HENT: Negative.   Respiratory: Negative.   Cardiovascular: Negative.   Gastrointestinal: Negative for abdominal pain.  Musculoskeletal: Negative.   Psychiatric/Behavioral: Negative for agitation, behavioral problems, confusion, decreased concentration, dysphoric mood, hallucinations, self-injury, sleep disturbance and suicidal ideas. The patient is not nervous/anxious and is not hyperactive.     Blood pressure 116/87, pulse 98, temperature 97.7 F (36.5 C), temperature source Oral, resp. rate 18, height 5\' 9"  (1.753 m), weight 63 kg, SpO2 98 %.Body mass index is 20.53 kg/m.  General Appearance: Casual  Eye Contact:  Good  Speech:  Clear and Coherent  Volume:  Normal  Mood:  Euthymic  Affect:  Appropriate   Thought Process:  Coherent, Linear and Descriptions of Associations: Intact  Orientation:  Full (Time, Place, and Person)  Thought Content:  Logical  Suicidal Thoughts:  No  Homicidal Thoughts:  No  Memory:  Immediate;   Good Recent;   Good Remote;   Good  Judgement:  Fair  Insight:  Fair  Psychomotor Activity:  Normal  Concentration:  Concentration: Good and Attention Span: Good  Recall:  Good  Fund of Knowledge:  Good  Language:  Good  Akathisia:  Negative  Handed:  Right  AIMS (if indicated):     Assets:  Communication Skills Desire for Improvement Financial Resources/Insurance Housing Physical Health Resilience Vocational/Educational  ADL's:  Intact  Cognition:  WNL  Sleep:  Number of Hours: 6.75     Have you used any form of tobacco in the last 30 days? (Cigarettes, Smokeless Tobacco, Cigars, and/or Pipes): Yes  Has this patient used any form of tobacco in the last 30 days? (Cigarettes, Smokeless Tobacco, Cigars, and/or Pipes) Yes, N/A  Blood Alcohol level:  Lab Results  Component Value Date   ETH <10 08/27/2020   ETH 203 (H) 08/25/2020    Metabolic Disorder Labs:  Lab Results  Component Value Date   HGBA1C 5.0 04/23/2016   Lab Results  Component Value Date   PROLACTIN 7.0 04/23/2016   Lab Results  Component Value Date   CHOL 193 04/23/2016   TRIG 94 04/23/2016   HDL 69 04/23/2016   CHOLHDL 2.8 04/23/2016   VLDL 19 04/23/2016   LDLCALC 105 (H) 04/23/2016    See Psychiatric Specialty Exam and Suicide Risk Assessment completed by Attending Physician prior to discharge.  Discharge destination:  Home  Is patient on multiple antipsychotic therapies at discharge:  No   Has Patient had three or more failed trials of antipsychotic monotherapy by history:  No  Recommended Plan for Multiple Antipsychotic Therapies: NA  Discharge Instructions    [facility] pharmacy consult for medication samples   Complete by: As directed    For 7 days   Discharge  patient   Complete by: As directed    Discharge disposition: 01-Home or Self Care   Discharge patient date: 08/31/2020     Allergies as of 08/31/2020      Reactions   Geodon [ziprasidone Hcl] Other (See Comments)   Seizures   Onion Anaphylaxis   Valproic Acid    Levofloxacin Itching      Medication List  TAKE these medications     Indication  hydrOXYzine 25 MG tablet Commonly known as: ATARAX/VISTARIL Take 1 tablet (25 mg total) by mouth 3 (three) times daily as needed for anxiety.  Indication: Feeling Anxious   hydrOXYzine 50 MG tablet Commonly known as: ATARAX/VISTARIL Take 1 tablet (50 mg total) by mouth at bedtime.  Indication: sleep   nicotine polacrilex 2 MG gum Commonly known as: NICORETTE Take 1 each (2 mg total) by mouth as needed for smoking cessation.  Indication: Nicotine Addiction   pantoprazole 40 MG tablet Commonly known as: PROTONIX Take 1 tablet (40 mg total) by mouth daily. Start taking on: September 01, 2020  Indication: Heartburn   risperiDONE 3 MG tablet Commonly known as: RISPERDAL Take 1 tablet (3 mg total) by mouth at bedtime.  Indication: Manic Phase of Manic-Depression        Follow-up recommendations:  Activity:  As tolerated Diet:  Normal  Comments:  Prescriptions given at discharge.Patient agreeable to plan. Given opportunity to ask questions. Appears to feel comfortable with discharge denies any current suicidal or homicidal thought. Patient is also instructed prior to discharge to: Take all medications as prescribed by his/her mental healthcare provider. Report any adverse effects and or reactions from the medicines to her outpatient provider promptly. Patient has been instructed & cautioned: To not engage in alcohol and or illegal drug use while on prescription medicines. In the event of worsening symptoms, patient is instructed to call the crisis hotline, 911 and or go to the nearest ED for appropriate evaluation and treatment  of symptoms. To follow-up with her primary care provider for your other medical issues, concerns and or health care needs.  Signed: Arnoldo Lenis, MD 08/31/2020, 7:29 PM

## 2020-08-31 NOTE — Progress Notes (Signed)
  [facility] Adult Case Management Discharge Plan :  Will you be returning to the same living situation after discharge:  Yes,  Patient is returning to At discharge, do you have transportation home?: Yes,  CSW team facilited transportation home.[address] [place]. Do you have the ability to pay for your medications: Yes,  Patient has means to get his medication.  Release of information consent forms completed and in the chart;  Patient's signature needed at discharge.  Patient to Follow up at: [facility]  [phone] [address] [place], [place] [address]   Next level of care provider has access to [facility] Link:yes  Safety Planning and Suicide Prevention discussed: Yes,  With patient.  Have you used any form of tobacco in the last 30 days? (Cigarettes, Smokeless Tobacco, Cigars, and/or Pipes): Yes  Has patient been referred to the Quitline?: Patient refused referral  Patient has been referred for addiction treatment: N/A  Evorn Gong, LCSW 08/31/2020, 4:16 PM

## 2020-08-31 NOTE — Care Management (Signed)
Trasnportation home set up through [employer]. Checked for liability waiver,needs to be signed. Call back number for transport is [phone], they will call when on the way.

## 2021-01-27 ENCOUNTER — Telehealth: Payer: Self-pay | Admitting: Pharmacy Technician

## 2021-01-27 NOTE — Telephone Encounter (Signed)
Patient no longer at [facility].  Failed to provide poi for current residence.  Patient made inactive.  Medication assistance will resume once poi is provided.  Anselma Herbel J. Syona Wroblewski Care Manager [facility] 

## 2021-02-03 ENCOUNTER — Other Ambulatory Visit: Payer: Self-pay

## 2021-02-03 ENCOUNTER — Emergency Department
Admission: EM | Admit: 2021-02-03 | Discharge: 2021-02-05 | Disposition: A | Discharge status: Home / Self Care | Payer: Self-pay | Attending: Emergency Medicine | Admitting: Emergency Medicine

## 2021-02-03 DIAGNOSIS — F10930 Alcohol use, unspecified with withdrawal, uncomplicated: Secondary | ICD-10-CM

## 2021-02-03 DIAGNOSIS — F17210 Nicotine dependence, cigarettes, uncomplicated: Secondary | ICD-10-CM | POA: Insufficient documentation

## 2021-02-03 DIAGNOSIS — F10239 Alcohol dependence with withdrawal, unspecified: Secondary | ICD-10-CM | POA: Insufficient documentation

## 2021-02-03 DIAGNOSIS — Z20822 Contact with and (suspected) exposure to covid-19: Secondary | ICD-10-CM | POA: Insufficient documentation

## 2021-02-03 DIAGNOSIS — F3160 Bipolar disorder, current episode mixed, unspecified: Secondary | ICD-10-CM | POA: Insufficient documentation

## 2021-02-03 DIAGNOSIS — F3163 Bipolar disorder, current episode mixed, severe, without psychotic features: Secondary | ICD-10-CM | POA: Diagnosis present

## 2021-02-03 DIAGNOSIS — R45851 Suicidal ideations: Secondary | ICD-10-CM | POA: Insufficient documentation

## 2021-02-03 DIAGNOSIS — F10230 Alcohol dependence with withdrawal, uncomplicated: Secondary | ICD-10-CM

## 2021-02-03 DIAGNOSIS — F1010 Alcohol abuse, uncomplicated: Secondary | ICD-10-CM | POA: Diagnosis present

## 2021-02-03 LAB — CBC
HCT: 43.5 % (ref 39.0–52.0)
Hemoglobin: 15.1 g/dL (ref 13.0–17.0)
MCH: 32.3 pg (ref 26.0–34.0)
MCHC: 34.7 g/dL (ref 30.0–36.0)
MCV: 93.1 fL (ref 80.0–100.0)
Platelets: 224 10*3/uL (ref 150–400)
RBC: 4.67 MIL/uL (ref 4.22–5.81)
RDW: 15.7 % — ABNORMAL HIGH (ref 11.5–15.5)
WBC: 10.2 10*3/uL (ref 4.0–10.5)
nRBC: 0 % (ref 0.0–0.2)

## 2021-02-03 LAB — ACETAMINOPHEN LEVEL: Acetaminophen (Tylenol), Serum: <10 ug/mL — ABNORMAL LOW (ref 10–30)

## 2021-02-03 LAB — COMPREHENSIVE METABOLIC PANEL
ALT: 54 U/L — ABNORMAL HIGH (ref 0–44)
AST: 70 U/L — ABNORMAL HIGH (ref 15–41)
Albumin: 4.4 g/dL (ref 3.5–5.0)
Alkaline Phosphatase: 74 U/L (ref 38–126)
Anion gap: 16 — ABNORMAL HIGH (ref 5–15)
BUN: 8 mg/dL (ref 6–20)
CO2: 21 mmol/L — ABNORMAL LOW (ref 22–32)
Calcium: 9.8 mg/dL (ref 8.9–10.3)
Chloride: 97 mmol/L — ABNORMAL LOW (ref 98–111)
Creatinine, Ser: 0.66 mg/dL (ref 0.61–1.24)
GFR, Estimated: >60 mL/min (ref >60)
Glucose, Bld: 166 mg/dL — ABNORMAL HIGH (ref 70–99)
Potassium: 3.8 mmol/L (ref 3.5–5.1)
Sodium: 134 mmol/L — ABNORMAL LOW (ref 135–145)
Total Bilirubin: 1 mg/dL (ref 0.3–1.2)
Total Protein: 7.9 g/dL (ref 6.5–8.1)

## 2021-02-03 LAB — LIPASE, BLOOD: Lipase: 31 U/L (ref 11–51)

## 2021-02-03 LAB — URINE DRUG SCREEN, QUALITATIVE (ARMC ONLY)
Amphetamines, Ur Screen: NOT DETECTED
Barbiturates, Ur Screen: NOT DETECTED
Benzodiazepine, Ur Scrn: NOT DETECTED
Cannabinoid 50 Ng, Ur ~~LOC~~: POSITIVE — AB
Cocaine Metabolite,Ur ~~LOC~~: NOT DETECTED
MDMA (Ecstasy)Ur Screen: NOT DETECTED
Methadone Scn, Ur: NOT DETECTED
Opiate, Ur Screen: NOT DETECTED
Phencyclidine (PCP) Ur S: NOT DETECTED
Tricyclic, Ur Screen: NOT DETECTED

## 2021-02-03 LAB — RESP PANEL BY RT-PCR (FLU A&B, COVID) ARPGX2
Influenza A by PCR: NEGATIVE
Influenza B by PCR: NEGATIVE
SARS Coronavirus 2 by RT PCR: NEGATIVE

## 2021-02-03 LAB — SALICYLATE LEVEL: Salicylate Lvl: <7 mg/dL — ABNORMAL LOW (ref 7.0–30.0)

## 2021-02-03 LAB — ETHANOL: Alcohol, Ethyl (B): <10 mg/dL (ref <10)

## 2021-02-03 MED ORDER — THIAMINE HCL 100 MG PO TABS
100.0000 mg | ORAL_TABLET | Freq: Every day | ORAL | Status: DC
  Administered 2021-02-03 – 2021-02-05 (×3): 100 mg via ORAL
  Filled 2021-02-03 (×3): qty 1

## 2021-02-03 MED ORDER — LORAZEPAM 2 MG/ML IJ SOLN
2.0000 mg | Freq: Once | INTRAMUSCULAR | Status: AC
  Administered 2021-02-03: 2 mg via INTRAVENOUS
  Filled 2021-02-03: qty 1

## 2021-02-03 MED ORDER — LACTATED RINGERS IV BOLUS
1000.0000 mL | Freq: Once | INTRAVENOUS | Status: AC
  Administered 2021-02-03: 1000 mL via INTRAVENOUS

## 2021-02-03 MED ORDER — THIAMINE HCL 100 MG/ML IJ SOLN
100.0000 mg | Freq: Every day | INTRAMUSCULAR | Status: DC
  Filled 2021-02-03: qty 2

## 2021-02-03 MED ORDER — LORAZEPAM 2 MG PO TABS
0.0000 mg | ORAL_TABLET | Freq: Four times a day (QID) | ORAL | Status: DC
  Administered 2021-02-03 – 2021-02-04 (×2): 2 mg via ORAL
  Administered 2021-02-04: 1 mg via ORAL
  Administered 2021-02-04 – 2021-02-05 (×3): 2 mg via ORAL
  Filled 2021-02-03 (×6): qty 1

## 2021-02-03 MED ORDER — LORAZEPAM 2 MG/ML IJ SOLN: 0.0000 mg | Freq: Two times a day (BID) | INTRAMUSCULAR | Status: DC

## 2021-02-03 MED ORDER — LORAZEPAM 2 MG/ML IJ SOLN: 0.0000 mg | Freq: Four times a day (QID) | INTRAMUSCULAR | Status: DC

## 2021-02-03 MED ORDER — LORAZEPAM 2 MG PO TABS: 0.0000 mg | ORAL_TABLET | Freq: Two times a day (BID) | ORAL | Status: DC

## 2021-02-03 MED ORDER — CHLORDIAZEPOXIDE HCL 25 MG PO CAPS
50.0000 mg | ORAL_CAPSULE | Freq: Once | ORAL | Status: AC
  Administered 2021-02-03: 50 mg via ORAL
  Filled 2021-02-03: qty 2

## 2021-02-03 NOTE — Consult Note (Signed)
[facility] Face-to-Face Psychiatry Consult   Reason for Consult: Consult for 35 year old man who came in reporting suicidal ideation and alcohol withdrawal Referring Physician: Su Hoff Patient Identification: Nathan [name] MRN:  [id number] Principal Diagnosis: Alcohol abuse Diagnosis:  Principal Problem:   Alcohol abuse Active Problems:   Bipolar 1 disorder, mixed, severe (HCC)   Total Time spent with patient: 1 hour  Subjective:   Nathan [name] is a 35 y.o. male patient admitted with "I need to detox from alcohol.  HPI: Patient seen chart reviewed.  35 year old man with a long history of alcohol abuse and mood symptoms came to the emergency room saying that he needed to detox from alcohol and also claiming to have suicidal thoughts.  Patient says his last drink was last night.  He says up until that point he was drinking "gallons" a day.  This is a self evidently absurd thing to claim but he sticks by it.  Blood alcohol level at the moment is negative.  He denies recent drug abuse.  He says that he just in the last day realized that he was suicidal with thoughts of wanting to die.  He has not been taking care of his physical or mental health.  Not following up with primary care doctor or psychiatric doctor.  Not taking any psychiatric medicine.  Patient reports some visual hallucinations today.  Denies auditory hallucinations.  Past Psychiatric History: Long history of presentation to the emergency room multiple prior hospitalizations.  I believe he has had some complicated withdrawal at times in the past.  Not every time.  Past history of other substance abuse as well.  Been diagnosed variously with bipolar disorder and depression in the past although he rarely follows up with recommended outpatient treatment so it is hard to know given that he overeats presents after substance abuse  Risk to Self:   Risk to Others:   Prior Inpatient Therapy:   Prior Outpatient Therapy:    Past Medical  History:  Past Medical History:  Diagnosis Date  . Anxiety   . Bipolar 1 disorder (HCC)   . Chronic headache   . Depression 03/30/2020  . ETOH abuse 03/30/2020  . 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 Horseshoe kidney   . Marijuana smoker 03/30/2020    Past Surgical History:  Procedure Laterality Date  . TONSILLECTOMY     Family History:  Family History  Problem Relation Age of Onset  . Hypertension Mother   . Diabetes Mother   . Hypertension Father   . Heart failure Father   . Diabetes Father    Family Psychiatric  History: None reported Social History:  Social History   Substance and Sexual Activity  Alcohol Use Yes  . Alcohol/week: 24.0 standard drinks  . Types: 24 Cans of beer per week   Comment: 4 gallons a day     Social History   Substance and Sexual Activity  Drug Use Yes  . Types: Marijuana   Comment: 2-3 joints weekly    Social History   Socioeconomic History  . Marital status: Single    Spouse name: Not on file  . Number of children: 0  . Years of education: 55  . Highest education level: Not on file  Occupational History  . Not on file  Tobacco Use  . Smoking status: Current Every Day Smoker    Packs/day: 1.00    Types: Cigarettes  . Smokeless tobacco: Current User    Types: Chew  Vaping Use  . Vaping Use: Never used  Substance and Sexual Activity  . Alcohol use: Yes    Alcohol/week: 24.0 standard drinks    Types: 24 Cans of beer per week    Comment: 4 gallons a day  . Drug use: Yes    Types: Marijuana    Comment: 2-3 joints weekly  . Sexual activity: Not Currently  Other Topics Concern  . Not on file  Social History Narrative  . Not on file   Social Determinants of Health   Financial Resource Strain: Not on file  Food Insecurity: Not on file  Transportation Needs: Not on file  Physical Activity: Not on file  Stress: Not on file  Social Connections: Not on file   Additional Social History:    Allergies:   Allergies   Allergen Reactions  . Geodon [Ziprasidone Hcl] Other (See Comments)    Seizures  . Onion Anaphylaxis  . Valproic Acid   . Levofloxacin Itching    Labs:  Results for orders placed or performed during the hospital encounter of 02/03/21 (from the past 48 hour(s))  Comprehensive metabolic panel     Status: Abnormal   Collection Time: 02/03/21  3:33 PM  Result Value Ref Range   Sodium 134 (L) 135 - 145 mmol/L   Potassium 3.8 3.5 - 5.1 mmol/L   Chloride 97 (L) 98 - 111 mmol/L   CO2 21 (L) 22 - 32 mmol/L   Glucose, Bld 166 (H) 70 - 99 mg/dL    Comment: Glucose reference range applies only to samples taken after fasting for at least 8 hours.   BUN 8 6 - 20 mg/dL   Creatinine, Ser 3.15 0.61 - 1.24 mg/dL   Calcium 9.8 8.9 - 17.6 mg/dL   Total Protein 7.9 6.5 - 8.1 g/dL   Albumin 4.4 3.5 - 5.0 g/dL   AST 70 (H) 15 - 41 U/L   ALT 54 (H) 0 - 44 U/L   Alkaline Phosphatase 74 38 - 126 U/L   Total Bilirubin 1.0 0.3 - 1.2 mg/dL   GFR, Estimated >16 >07 mL/min    Comment: (NOTE) Calculated using the CKD-EPI Creatinine Equation (2021)    Anion gap 16 (H) 5 - 15    Comment: Performed at [facility], [address] [place], [place] [address]  Ethanol     Status: None   Collection Time: 02/03/21  3:33 PM  Result Value Ref Range   Alcohol, Ethyl (B) <10 <10 mg/dL    Comment: (NOTE) Lowest detectable limit for serum alcohol is 10 mg/dL.  For medical purposes only. Performed at [facility], [address] [place], [place] [address]   Salicylate level     Status: Abnormal   Collection Time: 02/03/21  3:33 PM  Result Value Ref Range   Salicylate Lvl <7.0 (L) 7.0 - 30.0 mg/dL    Comment: Performed at [facility], [address] [place], [place] [address]  Acetaminophen level     Status: Abnormal   Collection Time: 02/03/21  3:33 PM  Result Value Ref Range   Acetaminophen (Tylenol), Serum <10 (L) 10 - 30 ug/mL    Comment: (NOTE) Therapeutic  concentrations vary significantly. A range of 10-30 ug/mL  may be an effective concentration for many patients. However, some  are best treated at concentrations outside of this range. Acetaminophen concentrations >150 ug/mL at 4 hours after ingestion  and >50 ug/mL at 12 hours after ingestion are often associated with  toxic reactions.  Performed at [facility], [address] [place], [place] [address]   cbc     Status: Abnormal   Collection Time: 02/03/21  3:33 PM  Result Value Ref Range   WBC 10.2 4.0 - 10.5 K/uL   RBC 4.67 4.22 - 5.81 MIL/uL   Hemoglobin 15.1 13.0 - 17.0 g/dL   HCT 51.7 61.6 - 07.3 %   MCV 93.1 80.0 - 100.0 fL   MCH 32.3 26.0 - 34.0 pg   MCHC 34.7 30.0 - 36.0 g/dL   RDW 71.0 (H) 62.6 - 94.8 %   Platelets 224 150 - 400 K/uL   nRBC 0.0 0.0 - 0.2 %    Comment: Performed at [facility], [address] [place], [place] [address]  Urine Drug Screen, Qualitative     Status: Abnormal   Collection Time: 02/03/21  3:39 PM  Result Value Ref Range   Tricyclic, Ur Screen NONE DETECTED NONE DETECTED   Amphetamines, Ur Screen NONE DETECTED NONE DETECTED   MDMA (Ecstasy)Ur Screen NONE DETECTED NONE DETECTED   Cocaine Metabolite,Ur [place] NONE DETECTED NONE DETECTED   Opiate, Ur Screen NONE DETECTED NONE DETECTED   Phencyclidine (PCP) Ur S NONE DETECTED NONE DETECTED   Cannabinoid 50 Ng, Ur [place] POSITIVE (A) NONE DETECTED   Barbiturates, Ur Screen NONE DETECTED NONE DETECTED   Benzodiazepine, Ur Scrn NONE DETECTED NONE DETECTED   Methadone Scn, Ur NONE DETECTED NONE DETECTED    Comment: (NOTE) Tricyclics + metabolites, urine    Cutoff 1000 ng/mL Amphetamines + metabolites, urine  Cutoff 1000 ng/mL MDMA (Ecstasy), urine              Cutoff 500 ng/mL Cocaine Metabolite, urine          Cutoff 300 ng/mL Opiate + metabolites, urine        Cutoff 300 ng/mL Phencyclidine (PCP), urine         Cutoff 25 ng/mL Cannabinoid, urine                 Cutoff 50  ng/mL Barbiturates + metabolites, urine  Cutoff 200 ng/mL Benzodiazepine, urine              Cutoff 200 ng/mL Methadone, urine                   Cutoff 300 ng/mL  The urine drug screen provides only a preliminary, unconfirmed analytical test result and should not be used for non-medical purposes. Clinical consideration and professional judgment should be applied to any positive drug screen result due to possible interfering substances. A more specific alternate chemical method must be used in order to obtain a confirmed analytical result. Gas chromatography / mass spectrometry (GC/MS) is the preferred confirm atory method. Performed at [facility], [address] [place], [place] [address]   Lipase, blood     Status: None   Collection Time: 02/03/21  3:48 PM  Result Value Ref Range   Lipase 31 11 - 51 U/L    Comment: Performed at [facility], [address] [place], [place] [address]  Resp Panel by RT-PCR (Flu A&B, Covid) Urine, Clean Catch     Status: None   Collection Time: 02/03/21  4:16 PM   Specimen: Urine, Clean Catch; Nasopharyngeal(NP) swabs in vial transport medium  Result Value Ref Range   SARS Coronavirus 2 by RT PCR NEGATIVE NEGATIVE    Comment: (NOTE) SARS-CoV-2 target nucleic acids are NOT DETECTED.  The SARS-CoV-2 RNA is generally detectable in upper respiratory specimens during the acute phase of infection. The lowest concentration of SARS-CoV-2 viral copies this assay can detect is 138 copies/mL. A negative result does not preclude SARS-Cov-2 infection and should not be used as the sole basis for treatment or other patient management decisions. A negative result may occur with  improper specimen collection/handling, submission of specimen other than nasopharyngeal swab, presence of viral mutation(s) within the areas targeted by this assay, and inadequate number of viral copies(<138 copies/mL). A negative result must be combined  with clinical observations, patient history, and epidemiological information. The expected result is Negative.  Fact Sheet for Patients:  BloggerCourse.comhttps://www.fda.gov/media/152166/download  Fact Sheet for Healthcare Providers:  SeriousBroker.ithttps://www.fda.gov/media/152162/download  This test is no t yet approved or cleared by the [place] FDA and  has been authorized for detection and/or diagnosis of SARS-CoV-2 by FDA under an Emergency Use Authorization (EUA). This EUA will remain  in effect (meaning this test can be used) for the duration of the COVID-19 declaration under Section 564(b)(1) of the Act, 21 U.S.C.section 360bbb-3(b)(1), unless the authorization is terminated  or revoked sooner.       Influenza A by PCR NEGATIVE NEGATIVE   Influenza B by PCR NEGATIVE NEGATIVE    Comment: (NOTE) The Xpert Xpress SARS-CoV-2/FLU/RSV plus assay is intended as an aid in the diagnosis of influenza from Nasopharyngeal swab specimens and should not be used as a sole basis for treatment. Nasal washings and aspirates are unacceptable for Xpert Xpress SARS-CoV-2/FLU/RSV testing.  Fact Sheet for Patients: BloggerCourse.comhttps://www.fda.gov/media/152166/download  Fact Sheet for Healthcare Providers: SeriousBroker.ithttps://www.fda.gov/media/152162/download  This test is not yet approved or cleared by the [place] FDA and has been authorized for detection and/or diagnosis of SARS-CoV-2 by FDA under an Emergency Use Authorization (EUA). This EUA will remain in effect (meaning this test can be used) for the duration of the COVID-19 declaration under Section 564(b)(1) of the Act, 21 U.S.C. section 360bbb-3(b)(1), unless the authorization is terminated or revoked.  Performed at [facility], [address] [place], [place] [id number]     Current Facility-Administered Medications  Medication Dose Route Frequency Provider Last Rate Last Admin  . LORazepam (ATIVAN) injection 0-4 mg  0-4 mg Intravenous Q6H Chesley NoonJessup,  Charles, MD       Or  . LORazepam (ATIVAN) tablet 0-4 mg  0-4 mg Oral Q6H Chesley NoonJessup, Charles, MD      . Melene Muller[START ON 02/06/2021] LORazepam (ATIVAN) injection 0-4 mg  0-4 mg Intravenous Q12H Chesley NoonJessup, Charles, MD       Or  . Melene Muller[START ON 02/06/2021] LORazepam (ATIVAN) tablet 0-4 mg  0-4 mg Oral Q12H Chesley NoonJessup, Charles, MD      . thiamine tablet 100 mg  100 mg Oral Daily Chesley NoonJessup, Charles, MD   100 mg at 02/03/21 1612   Or  . thiamine (B-1) injection 100 mg  100 mg Intravenous Daily Chesley NoonJessup, Charles, MD       Current Outpatient Medications  Medication Sig Dispense Refill  . hydrOXYzine (ATARAX/VISTARIL) 25 MG tablet Take 1 tablet (25 mg total) by mouth 3 (three) times daily as needed for anxiety. 30 tablet 0  . hydrOXYzine (ATARAX/VISTARIL) 50 MG tablet Take 1 tablet (50 mg total) by mouth at bedtime. 30 tablet 0  . nicotine polacrilex (NICORETTE) 2 MG gum Take 1 each (2 mg total) by mouth as needed for smoking cessation. 100 tablet 0  . pantoprazole (PROTONIX) 40 MG tablet Take 1 tablet (40 mg total) by  mouth daily. 30 tablet 0  . risperiDONE (RISPERDAL) 3 MG tablet Take 1 tablet (3 mg total) by mouth at bedtime. 30 tablet 0    Musculoskeletal: Strength & Muscle Tone: within normal limits Gait & Station: normal Patient leans: N/A  Psychiatric Specialty Exam: Physical Exam Constitutional:      Appearance: He is well-developed and well-nourished.  HENT:     Head: Normocephalic and atraumatic.  Eyes:     Conjunctiva/sclera: Conjunctivae normal.     Pupils: Pupils are equal, round, and reactive to light.  Cardiovascular:     Heart sounds: Normal heart sounds.  Pulmonary:     Effort: Pulmonary effort is normal.  Abdominal:     Palpations: Abdomen is soft.  Musculoskeletal:        General: Normal range of motion.     Cervical back: Normal range of motion.  Skin:    General: Skin is warm and dry.  Neurological:     General: No focal deficit present.     Mental Status: He is alert.  Psychiatric:         Attention and Perception: He is inattentive.        Mood and Affect: Mood is depressed. Affect is blunt.        Speech: Speech is delayed.        Behavior: Behavior is slowed.        Thought Content: Thought content includes suicidal ideation. Thought content does not include suicidal plan.        Cognition and Memory: Cognition is impaired.        Judgment: Judgment is impulsive.     Review of Systems  Constitutional: Negative.   HENT: Negative.   Eyes: Negative.   Respiratory: Negative.   Cardiovascular: Negative.   Gastrointestinal: Negative.   Musculoskeletal: Negative.   Skin: Negative.   Neurological: Negative.   Psychiatric/Behavioral: Positive for confusion and sleep disturbance. The patient is nervous/anxious.     Blood pressure 138/87, pulse 94, temperature 98 F (36.7 C), resp. rate 17, height  (1.727 m), weight 54.4 kg, SpO2 93 %.Body mass index is 18.25 kg/m.  General Appearance: Disheveled  Eye Contact:  Minimal  Speech:  Slow  Volume:  Decreased  Mood:  Depressed and Dysphoric  Affect:  Congruent  Thought Process:  Disorganized  Orientation:  Full (Time, Place, and Person)  Thought Content:  Rumination  Suicidal Thoughts:  Yes.  without intent/plan  Homicidal Thoughts:  No  Memory:  Immediate;   Fair Recent;   Fair Remote;   Fair  Judgement:  Impaired  Insight:  Shallow  Psychomotor Activity:  Decreased  Concentration:  Concentration: Poor  Recall:  Poor  Fund of Knowledge:  Fair  Language:  Fair  Akathisia:  No  Handed:  Right  AIMS (if indicated):     Assets:  Desire for Improvement  ADL's:  Impaired  Cognition:  Impaired,  Mild  Sleep:        Treatment Plan Summary: Medication management and Plan He is being treated with appropriate detox protocol.  Labs have been evaluated.  Vitals are stabilizing.  Patient is eating.  We have no beds to admit him to tonight.  Will recommend to TTS that if he continues to endorse suicidal ideation  tomorrow we can refer him to appropriate inpatient treatment.  Disposition: Recommend psychiatric Inpatient admission when medically cleared.  Mordecai Rasmussen, MD 02/03/2021 6:54 PM

## 2021-02-03 NOTE — ED Notes (Signed)
IVC pending consult   

## 2021-02-03 NOTE — ED Notes (Signed)
Black winter coat Chain wallet Lighter Cell phone  Black boots  Tan pants Black thermal pants  Black hat  Orange tshirt  Black belt  Pt has silver ring on right ring finger. Pt can not get ring off  Glasses remain on pt.

## 2021-02-03 NOTE — ED Notes (Signed)
INVOLUNTARY with all papers on chart/awaiting TTS/PSYCH consult ?

## 2021-02-03 NOTE — ED Provider Notes (Signed)
[facility] Emergency Department Provider Note   ____________________________________________   Event Date/Time   First MD Initiated Contact with Patient 02/03/21 1556     (approximate)  I have reviewed the triage vital signs and the nursing notes.   HISTORY  Chief Complaint SI and Alcohol Intoxication    HPI Nathan [name] is a 35 y.o. male with past medical history of bipolar disorder, schizoaffective disorder, alcohol abuse, and hepatitis C, who presents to the ED for alcohol abuse and suicidal ideation.  Patient reports that he has been drinking heavily for a long time, typically drinks multiple gallons of vodka per day from the time he wakes up to the time he goes to bed.  His last drink came last night and now he feels like he is developing withdrawal symptoms.  He has had thoughts of suicide throughout the day today with a plan to cut himself.  He denies any auditory or visual hallucinations, denies any thoughts of hurting others.  He denies any drug abuse.  He does complain of some pain in his upper abdomen along with nausea and vomiting for the past couple of days.        Past Medical History:  Diagnosis Date  . Anxiety   . Bipolar 1 disorder (HCC)   . Chronic headache   . Depression 03/30/2020  . ETOH abuse 03/30/2020  . 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 Horseshoe kidney   . Marijuana smoker 03/30/2020    Patient Active Problem List   Diagnosis Date Noted  . Abdominal pain   . Moderate alcohol use disorder (HCC) 08/29/2020  . Intentional overdose of drug in tablet form (HCC) 08/28/2020  . Schizoaffective disorder (HCC) 07/26/2020  . 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 Chronic headache 06/26/2020  . Encounter to establish care 06/26/2020  . History of gastroesophageal reflux (GERD) 06/26/2020  . Cannabis use disorder, moderate, in controlled environment (HCC) 04/10/2020  . Cocaine use disorder (HCC) 04/10/2020  . Alcohol use  disorder, severe, dependence (HCC) 04/09/2020  . Stress reaction, acute 04/09/2020  . Non-suicidal self harm 04/05/2020  . Bipolar 1 disorder, depressed (HCC) 01/31/2018  . Intercostal pain 09/07/2016  . Bipolar 1 disorder, mixed, severe (HCC) 04/22/2016  . Alcohol abuse 04/22/2016  . Suicidal ideation 04/22/2016  . Tobacco use disorder 04/22/2016  . 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 Cocaine abuse in remission (HCC) 04/26/2012    Past Surgical History:  Procedure Laterality Date  . TONSILLECTOMY      Prior to Admission medications   Medication Sig Start Date End Date Taking? Authorizing Provider  hydrOXYzine (ATARAX/VISTARIL) 25 MG tablet Take 1 tablet (25 mg total) by mouth 3 (three) times daily as needed for anxiety. 08/31/20   Mariel Craft, MD  hydrOXYzine (ATARAX/VISTARIL) 50 MG tablet Take 1 tablet (50 mg total) by mouth at bedtime. 08/31/20   Mariel Craft, MD  nicotine polacrilex (NICORETTE) 2 MG gum Take 1 each (2 mg total) by mouth as needed for smoking cessation. 08/31/20   Mariel Craft, MD  pantoprazole (PROTONIX) 40 MG tablet Take 1 tablet (40 mg total) by mouth daily. 09/01/20   Mariel Craft, MD  risperiDONE (RISPERDAL) 3 MG tablet Take 1 tablet (3 mg total) by mouth at bedtime. 08/31/20   Mariel Craft, MD    Allergies Geodon [ziprasidone hcl], Onion, Valproic acid, and Levofloxacin  Family History  Problem Relation Age of Onset  .  Hypertension Mother   . Diabetes Mother   . Hypertension Father   . Heart failure Father   . Diabetes Father     Social History Social History   Tobacco Use  . Smoking status: Current Every Day Smoker    Packs/day: 1.00    Types: Cigarettes  . Smokeless tobacco: Current User    Types: Chew  Vaping Use  . Vaping Use: Never used  Substance Use Topics  . Alcohol use: Yes    Alcohol/week: 24.0 standard drinks    Types: 24 Cans of beer per week    Comment: 4 gallons a day  .  Drug use: Yes    Types: Marijuana    Comment: 2-3 joints weekly    Review of Systems  Constitutional: No fever/chills Eyes: No visual changes. ENT: No sore throat. Cardiovascular: Denies chest pain. Respiratory: Denies shortness of breath. Gastrointestinal: Positive for abdominal pain, nausea, and vomiting.  No diarrhea.  No constipation. Genitourinary: Negative for dysuria. Musculoskeletal: Negative for back pain. Skin: Negative for rash. Neurological: Negative for headaches, focal weakness or numbness.  Positive for tremors and alcohol withdrawal.  ____________________________________________   PHYSICAL EXAM:  VITAL SIGNS: ED Triage Vitals [02/03/21 1531]  Enc Vitals Group     BP (!) 176/99     Pulse Rate (!) 121     Resp 17     Temp 98 F (36.7 C)     Temp src      SpO2 99 %     Weight 120 lb (54.4 kg)     Height 5\' 8"  (1.727 m)     Head Circumference      Peak Flow      Pain Score 10     Pain Loc      Pain Edu?      Excl. in GC?     Constitutional: Alert and oriented. Eyes: Conjunctivae are normal. Head: Atraumatic. Nose: No congestion/rhinnorhea. Mouth/Throat: Mucous membranes are moist. Neck: Normal ROM Cardiovascular: Tachycardic, regular rhythm. Grossly normal heart sounds. Respiratory: Normal respiratory effort.  No retractions. Lungs CTAB. Gastrointestinal: Soft and tender to palpation in the epigastrium with no rebound or guarding. No distention. Genitourinary: deferred Musculoskeletal: No lower extremity tenderness nor edema. Neurologic:  Normal speech and language. No gross focal neurologic deficits are appreciated.  Tremulous with tongue fasciculations. Skin:  Skin is warm, dry and intact. No rash noted. Psychiatric: Mood and affect are normal. Speech and behavior are normal.  ____________________________________________   LABS (all labs ordered are listed, but only abnormal results are displayed)  Labs Reviewed  COMPREHENSIVE METABOLIC  PANEL - Abnormal; Notable for the following components:      Result Value   Sodium 134 (*)    Chloride 97 (*)    CO2 21 (*)    Glucose, Bld 166 (*)    AST 70 (*)    ALT 54 (*)    Anion gap 16 (*)    All other components within normal limits  SALICYLATE LEVEL - Abnormal; Notable for the following components:   Salicylate Lvl <7.0 (*)    All other components within normal limits  ACETAMINOPHEN LEVEL - Abnormal; Notable for the following components:   Acetaminophen (Tylenol), Serum <10 (*)    All other components within normal limits  CBC - Abnormal; Notable for the following components:   RDW 15.7 (*)    All other components within normal limits  URINE DRUG SCREEN, QUALITATIVE ([facility] ONLY) - Abnormal; Notable for the following  components:   Cannabinoid 50 Ng, Ur [place] POSITIVE (*)    All other components within normal limits  RESP PANEL BY RT-PCR (FLU A&B, COVID) ARPGX2  ETHANOL  LIPASE, BLOOD   ____________________________________________  EKG  ED ECG REPORT I, Chesley Noon, the attending physician, personally viewed and interpreted this ECG.   Date: 02/03/2021  EKG Time: 16:37  Rate: 104  Rhythm: sinus tachycardia  Axis: Normal  Intervals:none  ST&T Change: None   PROCEDURES  Procedure(s) performed (including Critical Care):  Procedures   ____________________________________________   INITIAL IMPRESSION / ASSESSMENT AND PLAN / ED COURSE       35 year old male with past medical history of bipolar disorder, schizoaffective disorder, alcohol abuse, and hepatitis C who presents to the ED with symptoms of alcohol withdrawal and suicidal ideation.  Patient endorses specific plan of cutting himself and was placed under IVC.  He does have symptoms of alcohol withdrawal with associated tachycardia and hypertension.  We will treat with dose of Librium as well as Ativan, start patient on CIWA protocol.  We will have him evaluated by psychiatry and TTS, but if his withdrawal  symptoms worsen, he may benefit from medical admission for management of his alcohol withdrawal.  Patient reports alcohol withdrawal symptoms much improved following dose of Librium and Ativan, tachycardia and hypertension have also improved.  Lab work is reassuring at this time and patient may be medically cleared for psychiatric evaluation, we will maintain him on CIWA protocol.      ____________________________________________   FINAL CLINICAL IMPRESSION(S) / ED DIAGNOSES  Final diagnoses:  Alcohol withdrawal syndrome without complication (HCC)  Suicidal ideation     ED Discharge Orders    None       Note:  This document was prepared using Dragon voice recognition software and may include unintentional dictation errors.   Chesley Noon, MD 02/03/21 2112

## 2021-02-03 NOTE — BH Assessment (Signed)
Comprehensive Clinical Assessment (CCA) Screening, Triage and Referral Note  02/03/2021 [facility] [id number]   Per psych assessment HPI: Patient seen chart reviewed.  35 year old man with a long history of alcohol abuse and mood symptoms came to the emergency room saying that he needed to detox from alcohol and also claiming to have suicidal thoughts.  Patient says his last drink was last night.  He says up until that point he was drinking "gallons" a day.  This is a self evidently absurd thing to claim but he sticks by it.  Blood alcohol level at the moment is negative.  He denies recent drug abuse.  He says that he just in the last day realized that he was suicidal with thoughts of wanting to die.  He has not been taking care of his physical or mental health.  Not following up with primary care doctor or psychiatric doctor.  Not taking any psychiatric medicine.  Patient reports some visual hallucinations today.  Denies auditory hallucinations.   Chief Complaint:  Chief Complaint  Patient presents with  . SI  . Alcohol Intoxication   Visit Diagnosis: Alcohol Abuse   Patient Reported Information How did you hear about [place]? Self   Referral name: No data recorded  Referral phone number: No data recorded Whom do you see for routine medical problems? I don't have a doctor   Practice/Facility Name: No data recorded  Practice/Facility Phone Number: No data recorded  Name of Contact: No data recorded  Contact Number: No data recorded  Contact Fax Number: No data recorded  Prescriber Name: No data recorded  Prescriber Address (if known): No data recorded What Is the Reason for Your Visit/Call Today? SI & ETOH Detox  How Long Has This Been Causing You Problems? > than 6 months  Have You Recently Been in Any Inpatient Treatment (Hospital/Detox/Crisis Center/28-Day Program)? No   Name/Location of Program/Hospital:No data recorded  How Long Were You There? No data recorded  When  Were You Discharged? No data recorded Have You Ever Received Services From [facility] Before? Yes   Who Do You See at [facility]? ED & INPT  Have You Recently Had Any Thoughts About Hurting Yourself? Yes   Are You Planning to Commit Suicide/Harm Yourself At This time?  Yes  Have you Recently Had Thoughts About Hurting Someone Karolee Ohs? No   Explanation: No data recorded Have You Used Any Alcohol or Drugs in the Past 24 Hours? Yes   How Long Ago Did You Use Drugs or Alcohol?  No data recorded  What Did You Use and How Much? ETOH (Daily 3-4 gallons vodka/moonshine) & Marijuana (last use over a week ago, only smokes sometimes)  What Do You Feel Would Help You the Most Today? Assessment Only  Do You Currently Have a Therapist/Psychiatrist? No   Name of Therapist/Psychiatrist: No data recorded  Have You Been Recently Discharged From Any Office Practice or Programs? No   Explanation of Discharge From Practice/Program:  No data recorded    CCA Screening Triage Referral Assessment Type of Contact: Face-to-Face   Is this Initial or Reassessment? No data recorded  Date Telepsych consult ordered in [facility]:  08/27/2020   Time Telepsych consult ordered in [facility]:  1259  Patient Reported Information Reviewed? Yes   Patient Left Without Being Seen? No data recorded  Reason for Not Completing Assessment: No data recorded Collateral Involvement: Toniann Fail (Ex-girlfriend) [phone]  Does Patient Have a Court Appointed Legal Guardian? No data recorded  Name  and Contact of Legal Guardian:  Self  If Minor and Not Living with Parent(s), Who has Custody? n/a  Is CPS involved or ever been involved? Never  Is APS involved or ever been involved? Never  Patient Determined To Be At Risk for Harm To Self or Others Based on Review of Patient Reported Information or Presenting Complaint? No   Method: No data recorded  Availability of Means: No data recorded  Intent: No data recorded  Notification  Required: No data recorded  Additional Information for Danger to Others Potential:  No data recorded  Additional Comments for Danger to Others Potential:  No data recorded  Are There Guns or Other Weapons in Your Home?  No data recorded   Types of Guns/Weapons: No data recorded   Are These Weapons Safely Secured?                              No data recorded   Who Could Verify You Are Able To Have These Secured:    No data recorded Do You Have any Outstanding Charges, Pending Court Dates, Parole/Probation? No data recorded Contacted To Inform of Risk of Harm To Self or Others: No data recorded Location of Assessment: [facility] ED  Does Patient Present under Involuntary Commitment? No   IVC Papers Initial File Date: No data recorded  [place] of Residence: [place]  Patient Currently Receiving the Following Services: Not Receiving Services   Determination of Need: Emergent (2 hours)   Options For Referral: Intensive Outpatient Therapy; Medication Management   Opal Sidles, LCSWA

## 2021-02-03 NOTE — ED Triage Notes (Addendum)
Pt comes with c/o SI and alcohol withdrawal. Pt states he normal drinks 4 gallons of vodka and moonshine for years. Pt states last drink was last night. Pt states it was gallons.  Pt states SI that started today. Pt states he felt terrible this am and just felt to himself why even try. Pt tearful in triage.  Pt states little marijuana use.  Pt states hx of cutting and pills to hurt himself.  Pt shaking, nauseated and little headache. Pt vomiting in triage.

## 2021-02-03 NOTE — ED Notes (Signed)
Dinner tray given

## 2021-02-03 NOTE — ED Notes (Signed)
Report off to Davisha Linthicum t rn

## 2021-02-04 MED ORDER — ONDANSETRON 4 MG PO TBDP
4.0000 mg | ORAL_TABLET | Freq: Once | ORAL | Status: AC
  Administered 2021-02-04: 4 mg via ORAL
  Filled 2021-02-04: qty 1

## 2021-02-04 MED ORDER — CHLORDIAZEPOXIDE HCL 25 MG PO CAPS
25.0000 mg | ORAL_CAPSULE | Freq: Three times a day (TID) | ORAL | Status: DC
  Administered 2021-02-04 – 2021-02-05 (×3): 25 mg via ORAL
  Filled 2021-02-04 (×3): qty 1

## 2021-02-04 NOTE — ED Notes (Signed)
Hourly rounding reveals patient in room. No complaints, stable, in no acute distress. Q15 minute rounds and monitoring via Security Cameras to continue. 

## 2021-02-04 NOTE — ED Notes (Signed)
Patient requesting shower, clothes drenched in sweat. Clean dry clothes and oral hygiene supplies provided. Patient showered and went back into room to change his bed sheets. No laying in bed comfortably watching tv. Will continue to monitor.

## 2021-02-04 NOTE — ED Notes (Signed)
Pt. To BHU from ED ambulatory without difficulty, to room  BHU 4. Report from [facility] Pt. Is alert and oriented, warm and dry in no distress. Pt. Denies SI, HI, and AVH. Pt. Calm and cooperative. Pt. Made aware of security cameras and Q15 minute rounds. Pt. Encouraged to let Nursing staff know of any concerns or needs.   ENVIRONMENTAL ASSESSMENT Potentially harmful objects out of patient reach: Yes.   Personal belongings secured: Yes.   Patient dressed in hospital provided attire only: Yes.   Plastic bags out of patient reach: Yes.   Patient care equipment (cords, cables, call bells, lines, and drains) shortened, removed, or accounted for: Yes.   Equipment and supplies removed from bottom of stretcher: Yes.   Potentially toxic materials out of patient reach: Yes.   Sharps container removed or out of patient reach: Yes.

## 2021-02-04 NOTE — ED Notes (Signed)
Pt is asleep. Vs will be assess when pt is awake. No other needs found at this moment. 

## 2021-02-04 NOTE — ED Notes (Signed)
Snack and beverage given. 

## 2021-02-04 NOTE — BH Assessment (Signed)
Patient to be reviewed tomorrow 02/05/21 with [facility] BMU, no beds available tonight

## 2021-02-04 NOTE — ED Provider Notes (Signed)
Emergency Medicine Observation Re-evaluation Note  Dean Goldner is a 35 y.o. male, seen on rounds today.  Pt initially presented to the ED for complaints of SI and Alcohol Intoxication Currently, the patient is resting comfortably.  Physical Exam  BP 138/87   Pulse 94   Temp 98 F (36.7 C)   Resp 17   Ht 5\' 8"  (1.727 m)   Wt 54.4 kg   SpO2 93%   BMI 18.25 kg/m  Physical Exam Gen: No acute distress  Resp: Normal rise and fall of chest Neuro: Moving all four extremities Psych: Resting currently, calm and cooperative when awake    ED Course / MDM  EKG:    I have reviewed the labs performed to date as well as medications administered while in observation.  Recent changes in the last 24 hours include no acute events overnight.  Plan  Current plan is for inpatient psychiatric treatment. Patient is under full IVC at this time.   Ward, , DO 02/04/21 [phone]

## 2021-02-04 NOTE — ED Notes (Signed)
Pt sleeping, will defer vitals until pt is awake.  

## 2021-02-04 NOTE — ED Notes (Signed)
Report received from [facility]. Patient to be transferred to [facility] 4.

## 2021-02-04 NOTE — ED Notes (Signed)
Breakfast tray given. VS obtained. Shower has been offered. 

## 2021-02-04 NOTE — ED Notes (Signed)
Report to include Situation, Background, Assessment, and Recommendations received from Jeannette RN. Patient alert and oriented, warm and dry, in no acute distress. Patient denies SI, HI, AVH and pain. Patient made aware of Q15 minute rounds and security cameras for their safety. Patient instructed to come to me with needs or concerns.  

## 2021-02-04 NOTE — ED Notes (Signed)
Shower and linen supplies given to pt. Pt is currently in the restroom.

## 2021-02-04 NOTE — ED Notes (Signed)
Shower offered patient declined continues to sleep.  

## 2021-02-04 NOTE — Consult Note (Signed)
[facility] Face-to-Face Psychiatry Consult   Reason for Consult: Follow-up consult 35 year old man with depression and alcohol abuse Referring Physician: Siadecki Patient Identification: Nathan [name] MRN:  [id number] Principal Diagnosis: Alcohol abuse Diagnosis:  Principal Problem:   Alcohol abuse Active Problems:   Bipolar 1 disorder, mixed, severe (HCC)   Total Time spent with patient: 30 minutes  Subjective:   Nathan [name] is a 35 y.o. male patient admitted with "I feel terrible".  HPI: Follow-up on yesterday's note.  Patient has been on detox protocol.  No seizures identified no evidence of delirium.  He tells me that he is feeling terrible today.  Feels achy all over feels sick to his stomach.  Patient tells me once again that he was not using any other drugs except alcohol although his drug screen is positive for cannabis.  I was curious as to whether he might be having opiate withdrawal but he swears he is not.  He continues to voice passive suicidal thoughts and symptoms of depression  Past Psychiatric History: Past history of longstanding alcohol abuse and depression  Risk to Self:   Risk to Others:   Prior Inpatient Therapy:   Prior Outpatient Therapy:    Past Medical History:  Past Medical History:  Diagnosis Date  . Anxiety   . Bipolar 1 disorder (HCC)   . Chronic headache   . Depression 03/30/2020  . ETOH abuse 03/30/2020  . 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 Horseshoe kidney   . Marijuana smoker 03/30/2020    Past Surgical History:  Procedure Laterality Date  . TONSILLECTOMY     Family History:  Family History  Problem Relation Age of Onset  . Hypertension Mother   . Diabetes Mother   . Hypertension Father   . Heart failure Father   . Diabetes Father    Family Psychiatric  History: See previous Social History:  Social History   Substance and Sexual Activity  Alcohol Use Yes  . Alcohol/week: 24.0 standard drinks  . Types: 24 Cans of beer  per week   Comment: 4 gallons a day     Social History   Substance and Sexual Activity  Drug Use Yes  . Types: Marijuana   Comment: 2-3 joints weekly    Social History   Socioeconomic History  . Marital status: Single    Spouse name: Not on file  . Number of children: 0  . Years of education: 56  . Highest education level: Not on file  Occupational History  . Not on file  Tobacco Use  . Smoking status: Current Every Day Smoker    Packs/day: 1.00    Types: Cigarettes  . Smokeless tobacco: Current User    Types: Chew  Vaping Use  . Vaping Use: Never used  Substance and Sexual Activity  . Alcohol use: Yes    Alcohol/week: 24.0 standard drinks    Types: 24 Cans of beer per week    Comment: 4 gallons a day  . Drug use: Yes    Types: Marijuana    Comment: 2-3 joints weekly  . Sexual activity: Not Currently  Other Topics Concern  . Not on file  Social History Narrative  . Not on file   Social Determinants of Health   Financial Resource Strain: Not on file  Food Insecurity: Not on file  Transportation Needs: Not on file  Physical Activity: Not on file  Stress: Not on file  Social Connections: Not on file  Additional Social History:    Allergies:   Allergies  Allergen Reactions  . Geodon [Ziprasidone Hcl] Other (See Comments)    Seizures  . Onion Anaphylaxis  . Valproic Acid   . Levofloxacin Itching    Labs:  Results for orders placed or performed during the hospital encounter of 02/03/21 (from the past 48 hour(s))  Comprehensive metabolic panel     Status: Abnormal   Collection Time: 02/03/21  3:33 PM  Result Value Ref Range   Sodium 134 (L) 135 - 145 mmol/L   Potassium 3.8 3.5 - 5.1 mmol/L   Chloride 97 (L) 98 - 111 mmol/L   CO2 21 (L) 22 - 32 mmol/L   Glucose, Bld 166 (H) 70 - 99 mg/dL    Comment: Glucose reference range applies only to samples taken after fasting for at least 8 hours.   BUN 8 6 - 20 mg/dL   Creatinine, Ser 4.780.66 0.61 - 1.24 mg/dL    Calcium 9.8 8.9 - 29.510.3 mg/dL   Total Protein 7.9 6.5 - 8.1 g/dL   Albumin 4.4 3.5 - 5.0 g/dL   AST 70 (H) 15 - 41 U/L   ALT 54 (H) 0 - 44 U/L   Alkaline Phosphatase 74 38 - 126 U/L   Total Bilirubin 1.0 0.3 - 1.2 mg/dL   GFR, Estimated >62>60 >13>60 mL/min    Comment: (NOTE) Calculated using the CKD-EPI Creatinine Equation (2021)    Anion gap 16 (H) 5 - 15    Comment: Performed at [facility], [address] [place], [place] [address]  Ethanol     Status: None   Collection Time: 02/03/21  3:33 PM  Result Value Ref Range   Alcohol, Ethyl (B) <10 <10 mg/dL    Comment: (NOTE) Lowest detectable limit for serum alcohol is 10 mg/dL.  For medical purposes only. Performed at [facility], [address] [place], [place] [address]   Salicylate level     Status: Abnormal   Collection Time: 02/03/21  3:33 PM  Result Value Ref Range   Salicylate Lvl <7.0 (L) 7.0 - 30.0 mg/dL    Comment: Performed at [facility], [address] [place], [place] [address]  Acetaminophen level     Status: Abnormal   Collection Time: 02/03/21  3:33 PM  Result Value Ref Range   Acetaminophen (Tylenol), Serum <10 (L) 10 - 30 ug/mL    Comment: (NOTE) Therapeutic concentrations vary significantly. A range of 10-30 ug/mL  may be an effective concentration for many patients. However, some  are best treated at concentrations outside of this range. Acetaminophen concentrations >150 ug/mL at 4 hours after ingestion  and >50 ug/mL at 12 hours after ingestion are often associated with  toxic reactions.  Performed at [facility], [address] [place], [place] [address]   cbc     Status: Abnormal   Collection Time: 02/03/21  3:33 PM  Result Value Ref Range   WBC 10.2 4.0 - 10.5 K/uL   RBC 4.67 4.22 - 5.81 MIL/uL   Hemoglobin 15.1 13.0 - 17.0 g/dL   HCT 40.143.5 02.739.0 - 25.352.0 %   MCV 93.1 80.0 - 100.0 fL   MCH 32.3 26.0 - 34.0 pg   MCHC 34.7 30.0 - 36.0 g/dL   RDW 66.415.7  (H) 40.311.5 - 15.5 %   Platelets 224 150 - 400 K/uL   nRBC 0.0 0.0 - 0.2 %    Comment: Performed at [facility], [address] [place], [place] [address]  Urine Drug Screen, Qualitative     Status: Abnormal   Collection Time: 02/03/21  3:39 PM  Result Value Ref Range   Tricyclic, Ur Screen NONE DETECTED NONE DETECTED   Amphetamines, Ur Screen NONE DETECTED NONE DETECTED   MDMA (Ecstasy)Ur Screen NONE DETECTED NONE DETECTED   Cocaine Metabolite,Ur Lillie NONE DETECTED NONE DETECTED   Opiate, Ur Screen NONE DETECTED NONE DETECTED   Phencyclidine (PCP) Ur S NONE DETECTED NONE DETECTED   Cannabinoid 50 Ng, Ur [place] POSITIVE (A) NONE DETECTED   Barbiturates, Ur Screen NONE DETECTED NONE DETECTED   Benzodiazepine, Ur Scrn NONE DETECTED NONE DETECTED   Methadone Scn, Ur NONE DETECTED NONE DETECTED    Comment: (NOTE) Tricyclics + metabolites, urine    Cutoff 1000 ng/mL Amphetamines + metabolites, urine  Cutoff 1000 ng/mL MDMA (Ecstasy), urine              Cutoff 500 ng/mL Cocaine Metabolite, urine          Cutoff 300 ng/mL Opiate + metabolites, urine        Cutoff 300 ng/mL Phencyclidine (PCP), urine         Cutoff 25 ng/mL Cannabinoid, urine                 Cutoff 50 ng/mL Barbiturates + metabolites, urine  Cutoff 200 ng/mL Benzodiazepine, urine              Cutoff 200 ng/mL Methadone, urine                   Cutoff 300 ng/mL  The urine drug screen provides only a preliminary, unconfirmed analytical test result and should not be used for non-medical purposes. Clinical consideration and professional judgment should be applied to any positive drug screen result due to possible interfering substances. A more specific alternate chemical method must be used in order to obtain a confirmed analytical result. Gas chromatography / mass spectrometry (GC/MS) is the preferred confirm atory method. Performed at [facility], [address] [place], [place] [address]   Lipase, blood      Status: None   Collection Time: 02/03/21  3:48 PM  Result Value Ref Range   Lipase 31 11 - 51 U/L    Comment: Performed at [facility], [address] [place], [place] [address]  Resp Panel by RT-PCR (Flu A&B, Covid) Urine, Clean Catch     Status: None   Collection Time: 02/03/21  4:16 PM   Specimen: Urine, Clean Catch; Nasopharyngeal(NP) swabs in vial transport medium  Result Value Ref Range   SARS Coronavirus 2 by RT PCR NEGATIVE NEGATIVE    Comment: (NOTE) SARS-CoV-2 target nucleic acids are NOT DETECTED.  The SARS-CoV-2 RNA is generally detectable in upper respiratory specimens during the acute phase of infection. The lowest concentration of SARS-CoV-2 viral copies this assay can detect is 138 copies/mL. A negative result does not preclude SARS-Cov-2 infection and should not be used as the sole basis for treatment or other patient management decisions. A negative result may occur with  improper specimen collection/handling, submission of specimen other than nasopharyngeal swab, presence of viral mutation(s) within the areas targeted by this assay, and inadequate number of viral copies(<138 copies/mL). A negative result must be combined with clinical observations, patient history, and epidemiological information. The expected result is Negative.  Fact Sheet for Patients:  BloggerCourse.com  Fact Sheet for Healthcare Providers:  SeriousBroker.it  This test is no  t yet approved or cleared by the [place] and  has been authorized for detection and/or diagnosis of SARS-CoV-2 by FDA under an Emergency Use Authorization (EUA). This EUA will remain  in effect (meaning this test can be used) for the duration of the COVID-19 declaration under Section 564(b)(1) of the Act, 21 U.S.C.section 360bbb-3(b)(1), unless the authorization is terminated  or revoked sooner.       Influenza A by PCR NEGATIVE NEGATIVE    Influenza B by PCR NEGATIVE NEGATIVE    Comment: (NOTE) The Xpert Xpress SARS-CoV-2/FLU/RSV plus assay is intended as an aid in the diagnosis of influenza from Nasopharyngeal swab specimens and should not be used as a sole basis for treatment. Nasal washings and aspirates are unacceptable for Xpert Xpress SARS-CoV-2/FLU/RSV testing.  Fact Sheet for Patients: BloggerCourse.com  Fact Sheet for Healthcare Providers: SeriousBroker.it  This test is not yet approved or cleared by the [place] FDA and has been authorized for detection and/or diagnosis of SARS-CoV-2 by FDA under an Emergency Use Authorization (EUA). This EUA will remain in effect (meaning this test can be used) for the duration of the COVID-19 declaration under Section 564(b)(1) of the Act, 21 U.S.C. section 360bbb-3(b)(1), unless the authorization is terminated or revoked.  Performed at [facility], [address], [place], [place] [address]     Current Facility-Administered Medications  Medication Dose Route Frequency Provider Last Rate Last Admin  . chlordiazePOXIDE (LIBRIUM) capsule 25 mg  25 mg Oral TID Clapacs, John T, MD      . LORazepam (ATIVAN) injection 0-4 mg  0-4 mg Intravenous Q6H Chesley Noon, MD       Or  . LORazepam (ATIVAN) tablet 0-4 mg  0-4 mg Oral Q6H Chesley Noon, MD   2 mg at 02/04/21 0940  . [START ON 02/06/2021] LORazepam (ATIVAN) injection 0-4 mg  0-4 mg Intravenous Q12H Chesley Noon, MD       Or  . Melene Muller ON 02/06/2021] LORazepam (ATIVAN) tablet 0-4 mg  0-4 mg Oral Q12H Chesley Noon, MD      . thiamine tablet 100 mg  100 mg Oral Daily Chesley Noon, MD   100 mg at 02/04/21 6045   Or  . thiamine (B-1) injection 100 mg  100 mg Intravenous Daily Chesley Noon, MD       Current Outpatient Medications  Medication Sig Dispense Refill  . hydrOXYzine (ATARAX/VISTARIL) 25 MG tablet Take 1 tablet (25 mg total) by mouth 3  (three) times daily as needed for anxiety. (Patient not taking: Reported on 02/03/2021) 30 tablet 0  . hydrOXYzine (ATARAX/VISTARIL) 50 MG tablet Take 1 tablet (50 mg total) by mouth at bedtime. (Patient not taking: Reported on 02/03/2021) 30 tablet 0  . nicotine polacrilex (NICORETTE) 2 MG gum Take 1 each (2 mg total) by mouth as needed for smoking cessation. (Patient not taking: Reported on 02/03/2021) 100 tablet 0  . pantoprazole (PROTONIX) 40 MG tablet Take 1 tablet (40 mg total) by mouth daily. (Patient not taking: Reported on 02/03/2021) 30 tablet 0  . risperiDONE (RISPERDAL) 3 MG tablet Take 1 tablet (3 mg total) by mouth at bedtime. (Patient not taking: Reported on 02/03/2021) 30 tablet 0    Musculoskeletal: Strength & Muscle Tone: decreased Gait & Station: unable to stand Patient leans: N/A  Psychiatric Specialty Exam: Physical Exam Vitals and nursing note reviewed.  Constitutional:      Appearance: He is well-developed and well-nourished.  HENT:     Head: Normocephalic  and atraumatic.  Eyes:     Conjunctiva/sclera: Conjunctivae normal.     Pupils: Pupils are equal, round, and reactive to light.  Cardiovascular:     Heart sounds: Normal heart sounds.  Pulmonary:     Effort: Pulmonary effort is normal.  Abdominal:     Palpations: Abdomen is soft.  Musculoskeletal:        General: Normal range of motion.     Cervical back: Normal range of motion.  Skin:    General: Skin is warm and dry.  Neurological:     General: No focal deficit present.     Mental Status: He is alert.  Psychiatric:        Attention and Perception: Attention normal.        Mood and Affect: Affect is blunt.        Speech: Speech is delayed.        Behavior: Behavior is slowed.        Thought Content: Thought content includes suicidal ideation. Thought content does not include suicidal plan.        Cognition and Memory: Cognition is impaired. Memory is impaired.     Review of Systems  Constitutional:  Positive for fatigue.  HENT: Negative.   Eyes: Negative.   Respiratory: Negative.   Cardiovascular: Negative.   Gastrointestinal: Negative.   Musculoskeletal: Positive for arthralgias and myalgias.  Skin: Negative.   Neurological: Negative.   Psychiatric/Behavioral: Positive for dysphoric mood and suicidal ideas.    Blood pressure 131/84, pulse (!) 119, temperature 98.1 F (36.7 C), temperature source Oral, resp. rate 16, height 5\' 8"  (1.727 m), weight 54.4 kg, SpO2 98 %.Body mass index is 18.25 kg/m.  General Appearance: Disheveled  Eye Contact:  Minimal  Speech:  Slow  Volume:  Decreased  Mood:  Depressed  Affect:  Congruent  Thought Process:  Disorganized  Orientation:  Full (Time, Place, and Person)  Thought Content:  Logical  Suicidal Thoughts:  Yes.  without intent/plan  Homicidal Thoughts:  No  Memory:  Immediate;   Fair Recent;   Poor Remote;   Fair  Judgement:  Impaired  Insight:  Shallow  Psychomotor Activity:  Decreased  Concentration:  Concentration: Poor  Recall:  of Knowledge:  Fair  Language:  Fair  Akathisia:  No  Handed:  Right  AIMS (if indicated):     Assets:  Desire for Improvement  ADL's:  Impaired  Cognition:  Impaired,  Mild  Sleep:        Treatment Plan Summary: Plan Homeless patient with alcohol withdrawal and depression.  Given his complaints I will give him a small dose of standing Librium for the next 2 days.  We continue to keep him in mind for admission when a bed becomes available.  Disposition: Recommend psychiatric Inpatient admission when medically cleared. Supportive therapy provided about ongoing stressors. Discussed crisis plan, support from social network, calling 911, coming to the Emergency Department, and calling Suicide Hotline.  [employer], MD 02/04/2021 2:27 PM

## 2021-02-05 ENCOUNTER — Other Ambulatory Visit: Payer: Self-pay

## 2021-02-05 ENCOUNTER — Encounter: Payer: Self-pay | Admitting: Psychiatry

## 2021-02-05 ENCOUNTER — Inpatient Hospital Stay
Admission: AD | Admit: 2021-02-05 | Discharge: 2021-02-09 | DRG: 885 | Disposition: A | Discharge status: Home / Self Care | Payer: 59 | Source: Intra-hospital | Attending: Behavioral Health | Admitting: Behavioral Health

## 2021-02-05 DIAGNOSIS — Q631 Lobulated, fused and horseshoe kidney: Secondary | ICD-10-CM | POA: Diagnosis not present

## 2021-02-05 DIAGNOSIS — Z9151 Personal history of suicidal behavior: Secondary | ICD-10-CM | POA: Diagnosis not present

## 2021-02-05 DIAGNOSIS — F332 Major depressive disorder, recurrent severe without psychotic features: Secondary | ICD-10-CM | POA: Insufficient documentation

## 2021-02-05 DIAGNOSIS — Y900 Blood alcohol level of less than 20 mg/100 ml: Secondary | ICD-10-CM | POA: Diagnosis present

## 2021-02-05 DIAGNOSIS — Z888 Allergy status to other drugs, medicaments and biological substances status: Secondary | ICD-10-CM | POA: Diagnosis not present

## 2021-02-05 DIAGNOSIS — F10239 Alcohol dependence with withdrawal, unspecified: Secondary | ICD-10-CM | POA: Diagnosis present

## 2021-02-05 DIAGNOSIS — Z818 Family history of other mental and behavioral disorders: Secondary | ICD-10-CM

## 2021-02-05 DIAGNOSIS — F3163 Bipolar disorder, current episode mixed, severe, without psychotic features: Principal | ICD-10-CM | POA: Diagnosis present

## 2021-02-05 DIAGNOSIS — G4700 Insomnia, unspecified: Secondary | ICD-10-CM | POA: Diagnosis present

## 2021-02-05 DIAGNOSIS — R45851 Suicidal ideations: Secondary | ICD-10-CM | POA: Diagnosis present

## 2021-02-05 DIAGNOSIS — F1020 Alcohol dependence, uncomplicated: Secondary | ICD-10-CM | POA: Diagnosis present

## 2021-02-05 DIAGNOSIS — F17200 Nicotine dependence, unspecified, uncomplicated: Secondary | ICD-10-CM | POA: Diagnosis present

## 2021-02-05 DIAGNOSIS — F419 Anxiety disorder, unspecified: Secondary | ICD-10-CM | POA: Diagnosis present

## 2021-02-05 DIAGNOSIS — Z881 Allergy status to other antibiotic agents status: Secondary | ICD-10-CM | POA: Diagnosis not present

## 2021-02-05 DIAGNOSIS — Z20822 Contact with and (suspected) exposure to covid-19: Secondary | ICD-10-CM | POA: Diagnosis present

## 2021-02-05 DIAGNOSIS — B182 Chronic viral hepatitis C: Secondary | ICD-10-CM | POA: Diagnosis present

## 2021-02-05 DIAGNOSIS — F17210 Nicotine dependence, cigarettes, uncomplicated: Secondary | ICD-10-CM | POA: Diagnosis present

## 2021-02-05 DIAGNOSIS — F1220 Cannabis dependence, uncomplicated: Secondary | ICD-10-CM | POA: Diagnosis present

## 2021-02-05 DIAGNOSIS — Z91018 Allergy to other foods: Secondary | ICD-10-CM | POA: Diagnosis not present

## 2021-02-05 MED ORDER — LORAZEPAM 2 MG/ML IJ SOLN: 0.0000 mg | Freq: Two times a day (BID) | INTRAMUSCULAR | Status: DC

## 2021-02-05 MED ORDER — CHLORDIAZEPOXIDE HCL 25 MG PO CAPS
25.0000 mg | ORAL_CAPSULE | Freq: Three times a day (TID) | ORAL | Status: AC
  Administered 2021-02-05 – 2021-02-06 (×3): 25 mg via ORAL
  Filled 2021-02-05 (×3): qty 1

## 2021-02-05 MED ORDER — NICOTINE POLACRILEX 2 MG MT GUM
2.0000 mg | CHEWING_GUM | OROMUCOSAL | Status: DC | PRN
  Administered 2021-02-06 – 2021-02-09 (×10): 2 mg via ORAL
  Filled 2021-02-05 (×11): qty 1

## 2021-02-05 MED ORDER — THIAMINE HCL 100 MG PO TABS
100.0000 mg | ORAL_TABLET | Freq: Every day | ORAL | Status: DC
  Administered 2021-02-06 – 2021-02-08 (×3): 100 mg via ORAL
  Filled 2021-02-05 (×3): qty 1

## 2021-02-05 MED ORDER — LORAZEPAM 2 MG/ML IJ SOLN: 0.0000 mg | Freq: Two times a day (BID) | INTRAMUSCULAR | Status: AC

## 2021-02-05 MED ORDER — LORAZEPAM 2 MG PO TABS: 0.0000 mg | ORAL_TABLET | Freq: Two times a day (BID) | ORAL | Status: DC

## 2021-02-05 MED ORDER — ACETAMINOPHEN 325 MG PO TABS
650.0000 mg | ORAL_TABLET | Freq: Four times a day (QID) | ORAL | Status: DC | PRN
  Administered 2021-02-05 – 2021-02-08 (×6): 650 mg via ORAL
  Filled 2021-02-05 (×6): qty 2

## 2021-02-05 MED ORDER — HYDROXYZINE HCL 50 MG PO TABS
50.0000 mg | ORAL_TABLET | Freq: Three times a day (TID) | ORAL | Status: DC | PRN
  Administered 2021-02-05 – 2021-02-08 (×4): 50 mg via ORAL
  Filled 2021-02-05 (×4): qty 1

## 2021-02-05 MED ORDER — LURASIDONE HCL 40 MG PO TABS
20.0000 mg | ORAL_TABLET | Freq: Every day | ORAL | Status: DC
  Administered 2021-02-05: 20 mg via ORAL
  Filled 2021-02-05 (×2): qty 1

## 2021-02-05 MED ORDER — LORAZEPAM 2 MG PO TABS
0.0000 mg | ORAL_TABLET | Freq: Two times a day (BID) | ORAL | Status: AC
  Administered 2021-02-06 – 2021-02-07 (×4): 2 mg via ORAL
  Filled 2021-02-05 (×4): qty 1

## 2021-02-05 MED ORDER — LORAZEPAM 2 MG PO TABS
2.0000 mg | ORAL_TABLET | Freq: Once | ORAL | Status: AC
  Administered 2021-02-05: 2 mg via ORAL
  Filled 2021-02-05: qty 1

## 2021-02-05 MED ORDER — THIAMINE HCL 100 MG/ML IJ SOLN: 100.0000 mg | Freq: Every day | INTRAMUSCULAR | Status: DC

## 2021-02-05 MED ORDER — ALUM & MAG HYDROXIDE-SIMETH 200-200-20 MG/5ML PO SUSP: 30.0000 mL | ORAL | Status: DC | PRN

## 2021-02-05 MED ORDER — MAGNESIUM HYDROXIDE 400 MG/5ML PO SUSP: 30.0000 mL | Freq: Every day | ORAL | Status: DC | PRN

## 2021-02-05 MED ORDER — TRAZODONE HCL 100 MG PO TABS
100.0000 mg | ORAL_TABLET | Freq: Every evening | ORAL | Status: DC | PRN
  Administered 2021-02-06 – 2021-02-07 (×2): 100 mg via ORAL
  Filled 2021-02-05 (×3): qty 1

## 2021-02-05 NOTE — Plan of Care (Signed)
New admission. Problem: Education: Goal: Knowledge of [facility] Education information/materials will improve Outcome: Not Progressing Goal: Emotional status will improve Outcome: Not Progressing Goal: Mental status will improve Outcome: Not Progressing Goal: Verbalization of understanding the information provided will improve Outcome: Not Progressing   Problem: Activity: Goal: Interest or engagement in activities will improve Outcome: Not Progressing Goal: Sleeping patterns will improve Outcome: Not Progressing   Problem: Coping: Goal: Ability to verbalize frustrations and anger appropriately will improve Outcome: Not Progressing Goal: Ability to demonstrate self-control will improve Outcome: Not Progressing   Problem: Health Behavior/Discharge Planning: Goal: Identification of resources available to assist in meeting health care needs will improve Outcome: Not Progressing Goal: Compliance with treatment plan for underlying cause of condition will improve Outcome: Not Progressing   Problem: Physical Regulation: Goal: Ability to maintain clinical measurements within normal limits will improve Outcome: Not Progressing   Problem: Safety: Goal: Periods of time without injury will increase Outcome: Not Progressing   Problem: Education: Goal: Ability to state activities that reduce stress will improve Outcome: Not Progressing   Problem: Coping: Goal: Ability to identify and develop effective coping behavior will improve Outcome: Not Progressing   Problem: Self-Concept: Goal: Ability to identify factors that promote anxiety will improve Outcome: Not Progressing Goal: Level of anxiety will decrease Outcome: Not Progressing Goal: Ability to modify response to factors that promote anxiety will improve Outcome: Not Progressing   Problem: Education: Goal: Utilization of techniques to improve thought processes will improve Outcome: Not Progressing Goal: Knowledge  of the prescribed therapeutic regimen will improve Outcome: Not Progressing   Problem: Activity: Goal: Interest or engagement in leisure activities will improve Outcome: Not Progressing Goal: Imbalance in normal sleep/wake cycle will improve Outcome: Not Progressing   Problem: Coping: Goal: Coping ability will improve Outcome: Not Progressing Goal: Will verbalize feelings Outcome: Not Progressing   Problem: Health Behavior/Discharge Planning: Goal: Ability to make decisions will improve Outcome: Not Progressing Goal: Compliance with therapeutic regimen will improve Outcome: Not Progressing   Problem: Role Relationship: Goal: Will demonstrate positive changes in social behaviors and relationships Outcome: Not Progressing   Problem: Safety: Goal: Ability to disclose and discuss suicidal ideas will improve Outcome: Not Progressing Goal: Ability to identify and utilize support systems that promote safety will improve Outcome: Not Progressing   Problem: Self-Concept: Goal: Will verbalize positive feelings about self Outcome: Not Progressing Goal: Level of anxiety will decrease Outcome: Not Progressing

## 2021-02-05 NOTE — Tx Team (Signed)
Initial Treatment Plan 02/05/2021 3:22 PM Nathan [name] [id number]   PATIENT STRESSORS: Financial difficulties Substance abuse   PATIENT STRENGTHS: Supportive family/friends   PATIENT IDENTIFIED PROBLEMS: Substance abuse  Homelessness and financial difficulties                   DISCHARGE CRITERIA:  Ability to meet basic life and health needs Adequate post-discharge living arrangements Improved stabilization in mood, thinking, and/or behavior  PRELIMINARY DISCHARGE PLAN: Placement in alternative living arrangements  PATIENT/FAMILY INVOLVEMENT: This treatment plan has been presented to and reviewed with the patient, Nathan [name]. The patient has been given the opportunity to ask questions and make suggestions.  Celene Kras, RN 02/05/2021, 3:22 PM

## 2021-02-05 NOTE — ED Provider Notes (Signed)
Emergency Medicine Observation Re-evaluation Note  Nathan [name] is a 35 y.o. male, seen on rounds today.  Pt initially presented to the ED for complaints of SI and Alcohol Intoxication Currently, the patient is currently resting.  Physical Exam  BP 122/73 (BP Location: Right Arm)   Pulse 71   Temp 98.2 F (36.8 C) (Oral)   Resp 18   Ht 5\' 8"  (1.727 m)   Wt 54.4 kg   SpO2 96%   BMI 18.25 kg/m  Physical Exam General: NAD Cardiac: well perfused Lungs: even and unlabored Psych: currently calm  ED Course / MDM  EKG:EKG Interpretation  Date/Time:  Tuesday February 03 2021 16:37:14 EST Ventricular Rate:  104 PR Interval:    QRS Duration: 87 QT Interval:  348 QTC Calculation: 458 R Axis:   57 Text Interpretation: Sinus tachycardia ST elev, probable normal early repol pattern Confirmed by UNCONFIRMED, DOCTOR (01-12-1988), editor [id number], Tammy [phone]) on 02/04/2021 9:37:57 AM    I have reviewed the labs performed to date as well as medications administered while in observation.  Recent changes in the last 24 hours include none.  Plan  Current plan is for psych dispo. Patient is under full IVC at this time.   02/06/2021, MD 02/05/21 [phone]

## 2021-02-05 NOTE — Consult Note (Signed)
[facility] Face-to-Face Psychiatry Consult   Reason for Consult: Follow-up consult for 35 year old man with alcohol abuse and depression Referring Physician: Roxan Hockey Patient Identification: Nathan [name] MRN:  [id number] Principal Diagnosis: Alcohol abuse Diagnosis:  Principal Problem:   Alcohol abuse Active Problems:   Bipolar 1 disorder, mixed, severe (HCC)   Total Time spent with patient: 30 minutes  Subjective:   Nathan [name] is a 35 y.o. male patient admitted with "I am a little better than yesterday".  HPI: 35 year old man with history of major depression and suicidal thinking and alcohol abuse.  Patient had been recommended for inpatient treatment but no bed had been available until today.  On interview today the patient says he is still feeling depressed still has passive suicidal thoughts but feels slightly more hopeful he than yesterday.  He is less irritable than yesterday and more cooperative.  Vital signs are currently normal.  Not shaky.  Looks a little sedated.  Tells me he likes how the Librium makes him feel.  Past Psychiatric History: Past history of recurrent severe depression and alcohol abuse  Risk to Self:   Risk to Others:   Prior Inpatient Therapy:   Prior Outpatient Therapy:    Past Medical History:  Past Medical History:  Diagnosis Date  . Anxiety   . Bipolar 1 disorder (HCC)   . Chronic headache   . Depression 03/30/2020  . ETOH abuse 03/30/2020  . 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 Horseshoe kidney   . Marijuana smoker 03/30/2020    Past Surgical History:  Procedure Laterality Date  . TONSILLECTOMY     Family History:  Family History  Problem Relation Age of Onset  . Hypertension Mother   . Diabetes Mother   . Hypertension Father   . Heart failure Father   . Diabetes Father    Family Psychiatric  History: See previous Social History:  Social History   Substance and Sexual Activity  Alcohol Use Yes  . Alcohol/week: 24.0  standard drinks  . Types: 24 Cans of beer per week   Comment: 4 gallons a day     Social History   Substance and Sexual Activity  Drug Use Yes  . Types: Marijuana   Comment: 2-3 joints weekly    Social History   Socioeconomic History  . Marital status: Single    Spouse name: Not on file  . Number of children: 0  . Years of education: 49  . Highest education level: Not on file  Occupational History  . Not on file  Tobacco Use  . Smoking status: Current Every Day Smoker    Packs/day: 1.00    Types: Cigarettes  . Smokeless tobacco: Current User    Types: Chew  Vaping Use  . Vaping Use: Never used  Substance and Sexual Activity  . Alcohol use: Yes    Alcohol/week: 24.0 standard drinks    Types: 24 Cans of beer per week    Comment: 4 gallons a day  . Drug use: Yes    Types: Marijuana    Comment: 2-3 joints weekly  . Sexual activity: Not Currently  Other Topics Concern  . Not on file  Social History Narrative  . Not on file   Social Determinants of Health   Financial Resource Strain: Not on file  Food Insecurity: Not on file  Transportation Needs: Not on file  Physical Activity: Not on file  Stress: Not on file  Social Connections: Not on file  Additional Social History:    Allergies:   Allergies  Allergen Reactions  . Geodon [Ziprasidone Hcl] Other (See Comments)    Seizures  . Onion Anaphylaxis  . Valproic Acid   . Levofloxacin Itching    Labs:  Results for orders placed or performed during the hospital encounter of 02/03/21 (from the past 48 hour(s))  Comprehensive metabolic panel     Status: Abnormal   Collection Time: 02/03/21  3:33 PM  Result Value Ref Range   Sodium 134 (L) 135 - 145 mmol/L   Potassium 3.8 3.5 - 5.1 mmol/L   Chloride 97 (L) 98 - 111 mmol/L   CO2 21 (L) 22 - 32 mmol/L   Glucose, Bld 166 (H) 70 - 99 mg/dL    Comment: Glucose reference range applies only to samples taken after fasting for at least 8 hours.   BUN 8 6 - 20  mg/dL   Creatinine, Ser 1.610.66 0.61 - 1.24 mg/dL   Calcium 9.8 8.9 - 09.610.3 mg/dL   Total Protein 7.9 6.5 - 8.1 g/dL   Albumin 4.4 3.5 - 5.0 g/dL   AST 70 (H) 15 - 41 U/L   ALT 54 (H) 0 - 44 U/L   Alkaline Phosphatase 74 38 - 126 U/L   Total Bilirubin 1.0 0.3 - 1.2 mg/dL   GFR, Estimated >04>60 >54>60 mL/min    Comment: (NOTE) Calculated using the CKD-EPI Creatinine Equation (2021)    Anion gap 16 (H) 5 - 15    Comment: Performed at [facility], [address] [place], [place] [address]  Ethanol     Status: None   Collection Time: 02/03/21  3:33 PM  Result Value Ref Range   Alcohol, Ethyl (B) <10 <10 mg/dL    Comment: (NOTE) Lowest detectable limit for serum alcohol is 10 mg/dL.  For medical purposes only. Performed at [facility], [address] [place], [place] [address]   Salicylate level     Status: Abnormal   Collection Time: 02/03/21  3:33 PM  Result Value Ref Range   Salicylate Lvl <7.0 (L) 7.0 - 30.0 mg/dL    Comment: Performed at [facility], [address] [place], [place] [address]  Acetaminophen level     Status: Abnormal   Collection Time: 02/03/21  3:33 PM  Result Value Ref Range   Acetaminophen (Tylenol), Serum <10 (L) 10 - 30 ug/mL    Comment: (NOTE) Therapeutic concentrations vary significantly. A range of 10-30 ug/mL  may be an effective concentration for many patients. However, some  are best treated at concentrations outside of this range. Acetaminophen concentrations >150 ug/mL at 4 hours after ingestion  and >50 ug/mL at 12 hours after ingestion are often associated with  toxic reactions.  Performed at [facility], [address] [place], [place] [address]   cbc     Status: Abnormal   Collection Time: 02/03/21  3:33 PM  Result Value Ref Range   WBC 10.2 4.0 - 10.5 K/uL   RBC 4.67 4.22 - 5.81 MIL/uL   Hemoglobin 15.1 13.0 - 17.0 g/dL   HCT 57.843.5 46.939.0 - 62.952.0 %   MCV 93.1 80.0 - 100.0 fL   MCH 32.3 26.0 -  34.0 pg   MCHC 34.7 30.0 - 36.0 g/dL   RDW 52.815.7 (H) 41.311.5 - 24.415.5 %   Platelets 224 150 - 400 K/uL   nRBC 0.0 0.0 - 0.2 %    Comment: Performed at [facility], [address] [place], [place] [address]  Urine Drug Screen, Qualitative     Status: Abnormal   Collection Time: 02/03/21  3:39 PM  Result Value Ref Range   Tricyclic, Ur Screen NONE DETECTED NONE DETECTED   Amphetamines, Ur Screen NONE DETECTED NONE DETECTED   MDMA (Ecstasy)Ur Screen NONE DETECTED NONE DETECTED   Cocaine Metabolite,Ur [place] NONE DETECTED NONE DETECTED   Opiate, Ur Screen NONE DETECTED NONE DETECTED   Phencyclidine (PCP) Ur S NONE DETECTED NONE DETECTED   Cannabinoid 50 Ng, Ur [place] POSITIVE (A) NONE DETECTED   Barbiturates, Ur Screen NONE DETECTED NONE DETECTED   Benzodiazepine, Ur Scrn NONE DETECTED NONE DETECTED   Methadone Scn, Ur NONE DETECTED NONE DETECTED    Comment: (NOTE) Tricyclics + metabolites, urine    Cutoff 1000 ng/mL Amphetamines + metabolites, urine  Cutoff 1000 ng/mL MDMA (Ecstasy), urine              Cutoff 500 ng/mL Cocaine Metabolite, urine          Cutoff 300 ng/mL Opiate + metabolites, urine        Cutoff 300 ng/mL Phencyclidine (PCP), urine         Cutoff 25 ng/mL Cannabinoid, urine                 Cutoff 50 ng/mL Barbiturates + metabolites, urine  Cutoff 200 ng/mL Benzodiazepine, urine              Cutoff 200 ng/mL Methadone, urine                   Cutoff 300 ng/mL  The urine drug screen provides only a preliminary, unconfirmed analytical test result and should not be used for non-medical purposes. Clinical consideration and professional judgment should be applied to any positive drug screen result due to possible interfering substances. A more specific alternate chemical method must be used in order to obtain a confirmed analytical result. Gas chromatography / mass spectrometry (GC/MS) is the preferred confirm atory method. Performed at [facility], [address] [place], [place] [address]   Lipase, blood     Status: None   Collection Time: 02/03/21  3:48 PM  Result Value Ref Range   Lipase 31 11 - 51 U/L    Comment: Performed at [facility], [address] [place], [place] [address]  Resp Panel by RT-PCR (Flu A&B, Covid) Urine, Clean Catch     Status: None   Collection Time: 02/03/21  4:16 PM   Specimen: Urine, Clean Catch; Nasopharyngeal(NP) swabs in vial transport medium  Result Value Ref Range   SARS Coronavirus 2 by RT PCR NEGATIVE NEGATIVE    Comment: (NOTE) SARS-CoV-2 target nucleic acids are NOT DETECTED.  The SARS-CoV-2 RNA is generally detectable in upper respiratory specimens during the acute phase of infection. The lowest concentration of SARS-CoV-2 viral copies this assay can detect is 138 copies/mL. A negative result does not preclude SARS-Cov-2 infection and should not be used as the sole basis for treatment or other patient management decisions. A negative result may occur with  improper specimen collection/handling, submission of specimen other than nasopharyngeal swab, presence of viral mutation(s) within the areas targeted by this assay, and inadequate number of viral copies(<138 copies/mL). A negative result must be combined with clinical observations, patient history, and epidemiological information. The expected result is Negative.  Fact Sheet for Patients:  BloggerCourse.com  Fact Sheet for Healthcare Providers:  SeriousBroker.it  This test is no  t yet approved or cleared by the [place] and  has been authorized for detection and/or diagnosis of SARS-CoV-2 by FDA under an Emergency Use Authorization (EUA). This EUA will remain  in effect (meaning this test can be used) for the duration of the COVID-19 declaration under Section 564(b)(1) of the Act, 21 U.S.C.section 360bbb-3(b)(1), unless the authorization is terminated  or revoked sooner.        Influenza A by PCR NEGATIVE NEGATIVE   Influenza B by PCR NEGATIVE NEGATIVE    Comment: (NOTE) The Xpert Xpress SARS-CoV-2/FLU/RSV plus assay is intended as an aid in the diagnosis of influenza from Nasopharyngeal swab specimens and should not be used as a sole basis for treatment. Nasal washings and aspirates are unacceptable for Xpert Xpress SARS-CoV-2/FLU/RSV testing.  Fact Sheet for Patients: BloggerCourse.com  Fact Sheet for Healthcare Providers: SeriousBroker.it  This test is not yet approved or cleared by the [place] FDA and has been authorized for detection and/or diagnosis of SARS-CoV-2 by FDA under an Emergency Use Authorization (EUA). This EUA will remain in effect (meaning this test can be used) for the duration of the COVID-19 declaration under Section 564(b)(1) of the Act, 21 U.S.C. section 360bbb-3(b)(1), unless the authorization is terminated or revoked.  Performed at [facility], [address], [place], [place] [address]     Current Facility-Administered Medications  Medication Dose Route Frequency Provider Last Rate Last Admin  . chlordiazePOXIDE (LIBRIUM) capsule 25 mg  25 mg Oral TID Eiman Maret, Jackquline Denmark, MD   25 mg at 02/05/21 1009  . LORazepam (ATIVAN) injection 0-4 mg  0-4 mg Intravenous Q6H Chesley Noon, MD       Or  . LORazepam (ATIVAN) tablet 0-4 mg  0-4 mg Oral Q6H Chesley Noon, MD   2 mg at 02/04/21 2134  . [START ON 02/06/2021] LORazepam (ATIVAN) injection 0-4 mg  0-4 mg Intravenous Q12H Chesley Noon, MD       Or  . Melene Muller ON 02/06/2021] LORazepam (ATIVAN) tablet 0-4 mg  0-4 mg Oral Q12H Chesley Noon, MD      . thiamine tablet 100 mg  100 mg Oral Daily Chesley Noon, MD   100 mg at 02/05/21 1008   Or  . thiamine (B-1) injection 100 mg  100 mg Intravenous Daily Chesley Noon, MD       Current Outpatient Medications  Medication Sig Dispense Refill  . hydrOXYzine  (ATARAX/VISTARIL) 25 MG tablet Take 1 tablet (25 mg total) by mouth 3 (three) times daily as needed for anxiety. (Patient not taking: Reported on 02/03/2021) 30 tablet 0  . hydrOXYzine (ATARAX/VISTARIL) 50 MG tablet Take 1 tablet (50 mg total) by mouth at bedtime. (Patient not taking: Reported on 02/03/2021) 30 tablet 0  . nicotine polacrilex (NICORETTE) 2 MG gum Take 1 each (2 mg total) by mouth as needed for smoking cessation. (Patient not taking: Reported on 02/03/2021) 100 tablet 0  . pantoprazole (PROTONIX) 40 MG tablet Take 1 tablet (40 mg total) by mouth daily. (Patient not taking: Reported on 02/03/2021) 30 tablet 0  . risperiDONE (RISPERDAL) 3 MG tablet Take 1 tablet (3 mg total) by mouth at bedtime. (Patient not taking: Reported on 02/03/2021) 30 tablet 0    Musculoskeletal: Strength & Muscle Tone: within normal limits Gait & Station: normal Patient leans: N/A  Psychiatric Specialty Exam: Physical Exam Vitals and nursing note reviewed.  Constitutional:      Appearance: He is well-developed and well-nourished.  HENT:  Head: Normocephalic and atraumatic.  Eyes:     Conjunctiva/sclera: Conjunctivae normal.     Pupils: Pupils are equal, round, and reactive to light.  Cardiovascular:     Heart sounds: Normal heart sounds.  Pulmonary:     Effort: Pulmonary effort is normal.  Abdominal:     Palpations: Abdomen is soft.  Musculoskeletal:        General: Normal range of motion.     Cervical back: Normal range of motion.  Skin:    General: Skin is warm and dry.  Neurological:     General: No focal deficit present.     Mental Status: He is alert.  Psychiatric:        Attention and Perception: He is inattentive.        Mood and Affect: Mood is depressed.        Speech: Speech is slurred.        Behavior: Behavior is slowed.        Thought Content: Thought content includes suicidal ideation. Thought content does not include suicidal plan.        Cognition and Memory: Cognition is  impaired.     Review of Systems  Constitutional: Negative.   HENT: Negative.   Eyes: Negative.   Respiratory: Negative.   Cardiovascular: Negative.   Gastrointestinal: Negative.   Musculoskeletal: Negative.   Skin: Negative.   Neurological: Negative.   Psychiatric/Behavioral: Positive for dysphoric mood and suicidal ideas. The patient is nervous/anxious.     Blood pressure 125/76, pulse 72, temperature 98 F (36.7 C), temperature source Oral, resp. rate 16, height 5\' 8"  (1.727 m), weight 54.4 kg, SpO2 98 %.Body mass index is 18.25 kg/m.  General Appearance: Casual  Eye Contact:  Minimal  Speech:  Slow  Volume:  Decreased  Mood:  Depressed  Affect:  Congruent  Thought Process:  Coherent  Orientation:  Full (Time, Place, and Person)  Thought Content:  Logical  Suicidal Thoughts:  Yes.  without intent/plan  Homicidal Thoughts:  No  Memory:  Immediate;   Fair Recent;   Fair Remote;   Fair  Judgement:  Fair  Insight:  Fair  Psychomotor Activity:  Decreased  Concentration:  Concentration: Fair  Recall:  of Knowledge:  Fair  Language:  Fair  Akathisia:  No  Handed:  Right  AIMS (if indicated):     Assets:  Desire for Improvement Resilience  ADL's:  Impaired  Cognition:  Impaired,  Mild  Sleep:        Treatment Plan Summary: Medication management and Plan Patient appears to be stabilizing.  We have a bed available today and he is still meeting criteria for admission.  Plans will be for admission to the psychiatric ward today once a space is available.  Case reviewed with ER physician and TTS.  Orders will be completed.  Patient agrees to plan  Disposition: Recommend psychiatric Inpatient admission when medically cleared.  [employer], MD 02/05/2021 10:59 AM

## 2021-02-05 NOTE — ED Notes (Signed)
VS not taken, Patient asleep. 

## 2021-02-05 NOTE — ED Notes (Signed)
Hourly rounding reveals patient in room. No complaints, stable, in no acute distress. Q15 minute rounds and monitoring via Security Cameras to continue. 

## 2021-02-05 NOTE — ED Notes (Signed)
This RN attempted to call report. Receiving nurse Aundra Millet, RN at lunch at this time.

## 2021-02-05 NOTE — BH Assessment (Signed)
Patient is to be admitted to [facility] by Dr. Toni Amend.  Attending Physician will be Dr. Neale Burly.   Patient has been assigned to room 306, by [facility] Charge Nurse Megan.   ER staff is aware of the admission:  Nitchia, ER Secretary    Dr. Roxan Hockey, ER MD   Tobi Bastos, Patient's Nurse   Barkley [employer], Patient Access.

## 2021-02-05 NOTE — Progress Notes (Signed)
Pt rates anxiety 10/10 and depression 5/10. Pt denies SI, HI and AVH. Pt's stressors are being homeless, finances and his substance abuse. He does still have contact with his family. He says that he smokes four packs of cigarettes a day and "three-four gallons of liquor". He says that he is "short tempered". He is calm and cooperative with a sad, anxious affect. He also cannot read. He says that "all the letters run together." Pt was oriented to the unit and educated on care plan.  Torrie Mayers RN

## 2021-02-05 NOTE — ED Notes (Signed)
No vitals patient sleeping at this time
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
## 2021-02-05 NOTE — ED Notes (Signed)
Pt extremely agitated, stating "yall don't want to see me get bad. I've been locked up in a cell for 2 years. I can tear this whole mother fucker down". This RN deescalated situation and MD Clappacs gave the verbal to administer PO ativan per chart orders. Patient given meal tray and informed he was waiting on BMU bed. Pt informed we would get patient to unit as soon as we could. Patient deescalated and now apologetic for yelling at staff.

## 2021-02-05 NOTE — ED Notes (Signed)
Hourly rounding reveals patient in room. No complaints, stable, in no acute distress. Q15 minute rounds and monitoring via Rover and Officer to continue.   

## 2021-02-05 NOTE — H&P (Signed)
Psychiatric Admission Assessment Adult  Patient Identification: Nathan [name] MRN:  [id number] Date of Evaluation:  02/05/2021 Chief Complaint:  Severe recurrent major depression without psychotic features (HCC) [F33.2] Principal Diagnosis: Bipolar 1 disorder, mixed, severe (HCC) Diagnosis:  Principal Problem:   Bipolar 1 disorder, mixed, severe (HCC) Active Problems:   Tobacco use disorder   Alcohol use disorder, severe, dependence (HCC)   Cannabis use disorder, moderate, in controlled environment (HCC)  CC: "I need to get straight." History of Present Illness: 35 year old male presenting for suicidal ideations and alcohol withdrawals. He reports poor sleep, poor appetite, 30 pound weight loss, hopelessness, depressed mood, and suicidal ideations. He states he was drinking 3-4 gallons of alcohol daily, and has been drinking since he was a teenager. He reports he was been diagnosed with schizophrenia and intermittent explosive disorder in the past, and most of the time medications make him worse. He states his goals are to get sober, get a job, find a place to live, and get back into his children's life. He endorses anxiety, but no other alcohol withdrawal symptoms at this time. He denies homicidal ideations, visual hallucinations, or auditory hallucinations.  Associated Signs/Symptoms: Depression Symptoms:  depressed mood, anhedonia, insomnia, hopelessness, recurrent thoughts of death, suicidal thoughts with specific plan, weight loss, decreased appetite, Duration of Depression Symptoms: Greater than two weeks  (Hypo) Manic Symptoms:  Impulsivity, Irritable Mood, Anxiety Symptoms:  Excessive Worry, Psychotic Symptoms:  Denies Duration of Psychotic Symptoms: No data recorded PTSD Symptoms: Negative Total Time spent with patient: 1 hour  Past Psychiatric History: Patient has multiple presentations to the medical room as well as prior hospitlizations. He was been diagnosed with  major depressive disorder and bipolar disorder in the past. Per patient he states he was diagnosed with intermittent explosive disorder and schizophrenia in the past. He has been tried on Geodon, Prozac, depakote, Vistaril, Seroquel, and Zoloft he felt made his symptoms worse. He did note that Thorazine seemed to be somewhat helpful, but was too sedating during the day. He has a history of multiple suicide attempts.   Is the patient at risk to self? Yes.    Has the patient been a risk to self in the past 6 months? Yes.    Has the patient been a risk to self within the distant past? Yes.    Is the patient a risk to others? No.  Has the patient been a risk to others in the past 6 months? No.  Has the patient been a risk to others within the distant past? No.   Prior Inpatient Therapy:   Prior Outpatient Therapy:     Alcohol Screening: 1. How often do you have a drink containing alcohol?: 4 or more times a week 2. How many drinks containing alcohol do you have on a typical day when you are drinking?: 10 or more 3. How often do you have six or more drinks on one occasion?: Daily or almost daily AUDIT-C Score: 12 4. How often during the last year have you found that you were not able to stop drinking once you had started?: Daily or almost daily 5. How often during the last year have you failed to do what was normally expected from you because of drinking?: Daily or almost daily 6. How often during the last year have you needed a first drink in the morning to get yourself going after a heavy drinking session?: Daily or almost daily 7. How often during the last year have you had  a feeling of guilt of remorse after drinking?: Daily or almost daily 8. How often during the last year have you been unable to remember what happened the night before because you had been drinking?: Daily or almost daily 9. Have you or someone else been injured as a result of your drinking?: Yes, during the last year 10. Has a  relative or friend or a doctor or another health worker been concerned about your drinking or suggested you cut down?: Yes, during the last year Alcohol Use Disorder Identification Test Final Score (AUDIT): 40 Alcohol Brief Interventions/Follow-up: Alcohol Education Substance Abuse History in the last 12 months:  Yes.   Consequences of Substance Abuse: Medical Consequences:  elevated liver enzymes Legal Consequences:  history of DUI Family Consequences:  divorced, and no longer able to live with his children Withdrawal Symptoms:   Nausea Tremors Previous Psychotropic Medications: Yes  Psychological Evaluations: Yes  Past Medical History:  Past Medical History:  Diagnosis Date  . Anxiety   . Bipolar 1 disorder (HCC)   . Chronic headache   . Depression 03/30/2020  . ETOH abuse 03/30/2020  . 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 Horseshoe kidney   . Marijuana smoker 03/30/2020    Past Surgical History:  Procedure Laterality Date  . TONSILLECTOMY     Family History:  Family History  Problem Relation Age of Onset  . Hypertension Mother   . Diabetes Mother   . Hypertension Father   . Heart failure Father   . Diabetes Father    Family Psychiatric  History: Notes that his father has schizophrenia, and his mother has multiple suicide attempts "everyone" with substance use Tobacco Screening: Have you used any form of tobacco in the last 30 days? (Cigarettes, Smokeless Tobacco, Cigars, and/or Pipes): Yes Tobacco use, Select all that apply: 5 or more cigarettes per day Are you interested in Tobacco Cessation Medications?: Yes, will notify MD for an order Counseled patient on smoking cessation including recognizing danger situations, developing coping skills and basic information about quitting provided: Yes Social History:  Social History   Substance and Sexual Activity  Alcohol Use Yes  . Alcohol/week: 24.0 standard drinks  . Types: 24 Cans of beer per week   Comment: 4 gallons a  day     Social History   Substance and Sexual Activity  Drug Use Yes  . Types: Marijuana   Comment: 2-3 joints weekly    Additional Social History:                           Allergies:   Allergies  Allergen Reactions  . Geodon [Ziprasidone Hcl] Other (See Comments)    Seizures  . Onion Anaphylaxis  . Valproic Acid   . Levofloxacin Itching   Lab Results:  Results for orders placed or performed during the hospital encounter of 02/03/21 (from the past 48 hour(s))  Resp Panel by RT-PCR (Flu A&B, Covid) Urine, Clean Catch     Status: None   Collection Time: 02/03/21  4:16 PM   Specimen: Urine, Clean Catch; Nasopharyngeal(NP) swabs in vial transport medium  Result Value Ref Range   SARS Coronavirus 2 by RT PCR NEGATIVE NEGATIVE    Comment: (NOTE) SARS-CoV-2 target nucleic acids are NOT DETECTED.  The SARS-CoV-2 RNA is generally detectable in upper respiratory specimens during the acute phase of infection. The lowest concentration of SARS-CoV-2 viral copies this assay can detect is 138 copies/mL. A  negative result does not preclude SARS-Cov-2 infection and should not be used as the sole basis for treatment or other patient management decisions. A negative result may occur with  improper specimen collection/handling, submission of specimen other than nasopharyngeal swab, presence of viral mutation(s) within the areas targeted by this assay, and inadequate number of viral copies(<138 copies/mL). A negative result must be combined with clinical observations, patient history, and epidemiological information. The expected result is Negative.  Fact Sheet for Patients:  BloggerCourse.com  Fact Sheet for Healthcare Providers:  SeriousBroker.it  This test is no t yet approved or cleared by the [place] FDA and  has been authorized for detection and/or diagnosis of SARS-CoV-2 by FDA under an Emergency Use  Authorization (EUA). This EUA will remain  in effect (meaning this test can be used) for the duration of the COVID-19 declaration under Section 564(b)(1) of the Act, 21 U.S.C.section 360bbb-3(b)(1), unless the authorization is terminated  or revoked sooner.       Influenza A by PCR NEGATIVE NEGATIVE   Influenza B by PCR NEGATIVE NEGATIVE    Comment: (NOTE) The Xpert Xpress SARS-CoV-2/FLU/RSV plus assay is intended as an aid in the diagnosis of influenza from Nasopharyngeal swab specimens and should not be used as a sole basis for treatment. Nasal washings and aspirates are unacceptable for Xpert Xpress SARS-CoV-2/FLU/RSV testing.  Fact Sheet for Patients: BloggerCourse.com  Fact Sheet for Healthcare Providers: SeriousBroker.it  This test is not yet approved or cleared by the [place] FDA and has been authorized for detection and/or diagnosis of SARS-CoV-2 by FDA under an Emergency Use Authorization (EUA). This EUA will remain in effect (meaning this test can be used) for the duration of the COVID-19 declaration under Section 564(b)(1) of the Act, 21 U.S.C. section 360bbb-3(b)(1), unless the authorization is terminated or revoked.  Performed at [facility], [address] [place], [place] [address]     Blood Alcohol level:  Lab Results  Component Value Date   [facility] <10 02/03/2021   ETH <10 08/27/2020    Metabolic Disorder Labs:  Lab Results  Component Value Date   HGBA1C 5.0 04/23/2016   Lab Results  Component Value Date   PROLACTIN 7.0 04/23/2016   Lab Results  Component Value Date   CHOL 193 04/23/2016   TRIG 94 04/23/2016   HDL 69 04/23/2016   CHOLHDL 2.8 04/23/2016   VLDL 19 04/23/2016   LDLCALC 105 (H) 04/23/2016    Current Medications: Current Facility-Administered Medications  Medication Dose Route Frequency Provider Last Rate Last Admin  . acetaminophen (TYLENOL) tablet 650 mg  650  mg Oral Q6H PRN Clapacs, John T, MD      . alum & mag hydroxide-simeth (MAALOX/MYLANTA) 200-200-20 MG/5ML suspension 30 mL  30 mL Oral Q4H PRN Clapacs, John T, MD      . chlordiazePOXIDE (LIBRIUM) capsule 25 mg  25 mg Oral TID Clapacs, John T, MD      . hydrOXYzine (ATARAX/VISTARIL) tablet 50 mg  50 mg Oral TID PRN Clapacs, Jackquline Denmark, MD      . Melene Muller ON 02/06/2021] LORazepam (ATIVAN) injection 0-4 mg  0-4 mg Intramuscular Q12H Jesse Sans, MD       Or  . Melene Muller ON 02/06/2021] LORazepam (ATIVAN) tablet 0-4 mg  0-4 mg Oral Q12H Jesse Sans, MD      . lurasidone (LATUDA) tablet 20 mg  20 mg Oral Q supper Jesse Sans, MD      . magnesium  hydroxide (MILK OF MAGNESIA) suspension 30 mL  30 mL Oral Daily PRN Clapacs, John T, MD      . nicotine polacrilex (NICORETTE) gum 2 mg  2 mg Oral Q4H PRN Jesse Sans, MD      . Melene Muller ON 02/06/2021] thiamine tablet 100 mg  100 mg Oral Daily Clapacs, Jackquline Denmark, MD       Or  . Melene Muller ON 02/06/2021] thiamine (B-1) injection 100 mg  100 mg Intravenous Daily Clapacs, John T, MD      . traZODone (DESYREL) tablet 100 mg  100 mg Oral QHS PRN Clapacs, Jackquline Denmark, MD       PTA Medications: Medications Prior to Admission  Medication Sig Dispense Refill Last Dose  . hydrOXYzine (ATARAX/VISTARIL) 25 MG tablet Take 1 tablet (25 mg total) by mouth 3 (three) times daily as needed for anxiety. (Patient not taking: Reported on 02/03/2021) 30 tablet 0   . hydrOXYzine (ATARAX/VISTARIL) 50 MG tablet Take 1 tablet (50 mg total) by mouth at bedtime. (Patient not taking: Reported on 02/03/2021) 30 tablet 0   . nicotine polacrilex (NICORETTE) 2 MG gum Take 1 each (2 mg total) by mouth as needed for smoking cessation. (Patient not taking: Reported on 02/03/2021) 100 tablet 0   . pantoprazole (PROTONIX) 40 MG tablet Take 1 tablet (40 mg total) by mouth daily. (Patient not taking: Reported on 02/03/2021) 30 tablet 0   . risperiDONE (RISPERDAL) 3 MG tablet Take 1 tablet (3 mg total) by  mouth at bedtime. (Patient not taking: Reported on 02/03/2021) 30 tablet 0     Musculoskeletal: Strength & Muscle Tone: within normal limits Gait & Station: normal Patient leans: N/A  Psychiatric Specialty Exam: Physical Exam Vitals and nursing note reviewed.  Constitutional:      Appearance: Normal appearance.  HENT:     Head: Normocephalic and atraumatic.     Right Ear: External ear normal.     Left Ear: External ear normal.     Nose: Nose normal.     Mouth/Throat:     Mouth: Mucous membranes are moist.     Pharynx: Oropharynx is clear.  Eyes:     Extraocular Movements: Extraocular movements intact.     Conjunctiva/sclera: Conjunctivae normal.     Pupils: Pupils are equal, round, and reactive to light.  Cardiovascular:     Rate and Rhythm: Normal rate and regular rhythm.     Pulses: Normal pulses.     Heart sounds: Normal heart sounds.  Pulmonary:     Effort: Pulmonary effort is normal.     Breath sounds: Normal breath sounds.  Abdominal:     General: Abdomen is flat.     Palpations: Abdomen is soft.  Musculoskeletal:        General: No swelling. Normal range of motion.     Cervical back: Normal range of motion and neck supple.  Skin:    General: Skin is warm and dry.     Comments: Multiple tattoos visible on face, neck, chest, and bilateral arms. Old scar to left forearm  Neurological:     General: No focal deficit present.     Mental Status: He is alert and oriented to person, place, and time.  Psychiatric:        Attention and Perception: Attention and perception normal.        Mood and Affect: Affect normal. Mood is anxious.        Speech: Speech is rapid and pressured.  Behavior: Behavior is cooperative.        Thought Content: Thought content includes suicidal ideation. Thought content does not include homicidal ideation. Thought content includes suicidal plan.        Cognition and Memory: Cognition and memory normal.        Judgment: Judgment is  impulsive.     Review of Systems  Constitutional: Positive for appetite change and fatigue.  HENT: Negative for rhinorrhea and sore throat.   Eyes: Negative for photophobia and visual disturbance.  Respiratory: Negative for cough and shortness of breath.   Cardiovascular: Negative for chest pain and palpitations.  Gastrointestinal: Negative for constipation, diarrhea, nausea and vomiting.  Endocrine: Negative for cold intolerance and heat intolerance.  Genitourinary: Negative for difficulty urinating and dysuria.  Musculoskeletal: Negative for arthralgias and myalgias.  Skin: Negative for rash and wound.  Allergic/Immunologic: Positive for environmental allergies and food allergies.  Neurological: Negative for dizziness and headaches.  Hematological: Negative for adenopathy. Does not bruise/bleed easily.  Psychiatric/Behavioral: Positive for agitation, dysphoric mood, sleep disturbance and suicidal ideas.    Blood pressure 125/85, pulse 83, temperature 98.9 F (37.2 C), temperature source Oral, resp. rate 18, height 5\' 9"  (1.753 m), weight 59 kg, SpO2 95 %.Body mass index is 19.2 kg/m.  General Appearance: Disheveled  Eye Contact:  Fair  Speech:  Pressured  Volume:  Normal  Mood:  Anxious  Affect:  Congruent  Thought Process:  Coherent  Orientation:  Full (Time, Place, and Person)  Thought Content:  Logical  Suicidal Thoughts:  Yes.  with intent/plan  Homicidal Thoughts:  No  Memory:  Immediate;   Fair Recent;   Fair Remote;   Fair  Judgement:  Intact  Insight:  Present  Psychomotor Activity:  Normal  Concentration:  Concentration: Fair  Recall:  [employer]Fair  Fund of Knowledge:  Fair  Language:  Fair  Akathisia:  Negative  Handed:  Right  AIMS (if indicated):     Assets:  Communication Skills Desire for Improvement Physical Health Resilience Social Support  ADL's:  Intact  Cognition:  WNL  Sleep:       Treatment Plan Summary: Daily contact with patient to assess and  evaluate symptoms and progress in treatment and Medication management   1)Bipolar I Disorder, mixed, severe vs MDD, recurrent, severe- established problem, unstable - Patient reporting suicidal ideations with plan to drink himself to death. Feels his symptoms worsened with Geodon, Prozac, Depakote, Vistaril, Seroquel, Abilify, and Zoloft  - Start Latuda 20 mg with supper and titrate as tolerated - Will order lipid panel and hemoglobin A1c  2) Alcohol Use Disorder, severe- established problem, unstable - Librium taper, thiamine replacement - CIWA protocol - Patient requesting detox resources   3) Tobacco Use Disorder- established problem - Nicorette gum   4)Cannabis Use Disorder- established problem - Cessation counseling  Observation Level/Precautions:  Detox  Laboratory:  lipid panel, hemoglobin a1c  Psychotherapy:    Medications:    Consultations:    Discharge Concerns:    Estimated LOS:  Other:     Physician Treatment Plan for Primary Diagnosis: Bipolar 1 disorder, mixed, severe (HCC) Long Term Goal(s): Improvement in symptoms so as ready for discharge  Short Term Goals: Ability to identify changes in lifestyle to reduce recurrence of condition will improve, Ability to verbalize feelings will improve, Ability to disclose and discuss suicidal ideas, Ability to demonstrate self-control will improve, Ability to identify and develop effective coping behaviors will improve, Compliance with prescribed medications will improve  and Ability to identify triggers associated with substance abuse/mental health issues will improve  Physician Treatment Plan for Secondary Diagnosis: Principal Problem:   Bipolar 1 disorder, mixed, severe (HCC) Active Problems:   Tobacco use disorder   Alcohol use disorder, severe, dependence (HCC)   Cannabis use disorder, moderate, in controlled environment (HCC)  Long Term Goal(s): Improvement in symptoms so as ready for discharge  Short Term Goals:  Ability to identify changes in lifestyle to reduce recurrence of condition will improve, Ability to verbalize feelings will improve, Ability to disclose and discuss suicidal ideas, Ability to demonstrate self-control will improve, Ability to identify and develop effective coping behaviors will improve, Compliance with prescribed medications will improve and Ability to identify triggers associated with substance abuse/mental health issues will improve  I certify that inpatient services furnished can reasonably be expected to improve the patient's condition.    Jesse Sans, MD 2/17/20224:00 PM

## 2021-02-06 LAB — LIPID PANEL
Cholesterol: 139 mg/dL (ref 0–200)
HDL: 55 mg/dL (ref >40)
LDL Cholesterol: 73 mg/dL (ref 0–99)
Total CHOL/HDL Ratio: 2.5 RATIO
Triglycerides: 55 mg/dL (ref <150)
VLDL: 11 mg/dL (ref 0–40)

## 2021-02-06 MED ORDER — ADULT MULTIVITAMIN W/MINERALS CH
1.0000 | ORAL_TABLET | Freq: Every day | ORAL | Status: DC
  Administered 2021-02-07 – 2021-02-09 (×3): 1 via ORAL
  Filled 2021-02-06 (×3): qty 1

## 2021-02-06 MED ORDER — ENSURE ENLIVE PO LIQD
237.0000 mL | Freq: Three times a day (TID) | ORAL | Status: DC
  Administered 2021-02-06 – 2021-02-08 (×9): 237 mL via ORAL

## 2021-02-06 MED ORDER — FOLIC ACID 1 MG PO TABS
1.0000 mg | ORAL_TABLET | Freq: Every day | ORAL | Status: DC
  Administered 2021-02-07 – 2021-02-08 (×2): 1 mg via ORAL
  Filled 2021-02-06 (×2): qty 1

## 2021-02-06 NOTE — Progress Notes (Signed)
Patient alert and oriented x 4, affect is blunted thoughts are organized and coherent, he denies SI/HI/AVH he was visible in the dayroom interacting appropriately with peers and staff, he's currently on CIWA  Protocol no S/S of withdrawals noted, he was encouraged to drink fluids. 15 minutes safety checks maintained will continue to monitor.

## 2021-02-06 NOTE — Progress Notes (Signed)
Initial Nutrition Assessment  DOCUMENTATION CODES:   Not applicable  INTERVENTION:   Ensure Enlive po TID, each supplement provides 350 kcal and 20 grams of protein  MVI po daily   Thiamine and folic acid daily in setting of etoh abuse  Pt at high refeed risk; recommend monitor potassium, magnesium and phosphorus labs daily until stable  NUTRITION DIAGNOSIS:   Inadequate oral intake related to social / environmental circumstances (substance abuse, depression) as evidenced by per patient/family report.  GOAL:   Patient will meet greater than or equal to 90% of their needs  MONITOR:   PO intake,Supplement acceptance  REASON FOR ASSESSMENT:   Malnutrition Screening Tool    ASSESSMENT:   35 y/o male with h/o bipolar I and substance abuse who is admitted with suicidal ideations and alcohol withdrawal  Per chart review, pt reports poor appetite and oral intake pta along with a 30lb recent weight loss. RD suspects pt with poor appetite and oral intake at baseline secondary to substance abuse. Pt is currently eating 100% of meals in hospital. Patient is receiving thiamine. RD will add supplements and MVI to help pt meet his estimated needs. Pt is likely at refeed risk. Per chart, pt is down 12lbs(8%) since July; this is not significant.   Medications reviewed and include: thiamine   Labs reviewed:   Diet Order:   Diet Order            Diet regular Room service appropriate? Yes; Fluid consistency: Thin  Diet effective now                EDUCATION NEEDS:   No education needs have been identified at this time  Skin:  Skin Assessment: Reviewed RN Assessment  Last BM:  unknown  Height:   Ht Readings from Last 1 Encounters:  02/05/21 5\' 9"  (1.753 m)    Weight:   Wt Readings from Last 1 Encounters:  02/05/21 59 kg    BMI:  Body mass index is 19.2 kg/m.  Estimated Nutritional Needs:   Kcal:  2000-2300kcal/day  Protein:  100-115g/day  Fluid:   1.8-2.1L/day  02/07/21 MS, RD, LDN Please refer to [facility] for RD and/or RD on-call/weekend/after hours pager

## 2021-02-06 NOTE — Progress Notes (Signed)
Patient medicated for CIWA score of 5 he was noted anxious, and restless and he also c/o headache. Patient was given lorazepam 2 mg and tylenol 650 mg will continue to monitor

## 2021-02-06 NOTE — Progress Notes (Signed)
[facility]  02/06/2021 2:20 PM Nathan KatosHarmon [name] [name]  MRN:  [id number]   CC "Take me off this medication"  Subjective:  35 year old male presenting for suicidal ideations and alcohol withdrawals. Yesterday afternoon patient with CIWA 14 requiring PRN Ativan, also required Ativan overnight for CIWA of 5. Patient is extremely irritable on exam today. He insists that his poor mood is [facility] as opposed to alcohol withdrawals. He requests to leave stating that he is not feeling suicidal today. He also denies homicidal ideations, visual hallucinations, and auditory hallucinations. He is minimally receptive to rationale of continuing admission due to active alcohol detox that is still occurring. Additionally, patient was suicidal as of yesterday, and continues to be a moderate suicide risk. Will continue admission at this time.   Principal Problem: Bipolar 1 disorder, mixed, severe (HCC) Diagnosis: Principal Problem:   Bipolar 1 disorder, mixed, severe (HCC) Active Problems:   Tobacco use disorder   Alcohol use disorder, severe, dependence (HCC)   Cannabis use disorder, moderate, in controlled environment (HCC)  Total Time spent with patient: 30 minutes  Past Psychiatric History: See H&P  Past Medical History:  Past Medical History:  Diagnosis Date  . Anxiety   . Bipolar 1 disorder (HCC)   . Chronic headache   . Depression 03/30/2020  . ETOH abuse 03/30/2020  . 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 Horseshoe kidney   . Marijuana smoker 03/30/2020    Past Surgical History:  Procedure Laterality Date  . TONSILLECTOMY     Family History:  Family History  Problem Relation Age of Onset  . Hypertension Mother   . Diabetes Mother   . Hypertension Father   . Heart failure Father   . Diabetes Father    Family Psychiatric  History: See H&P Social History:  Social History   Substance and Sexual Activity  Alcohol Use Yes  . Alcohol/week: 24.0 standard drinks  .  Types: 24 Cans of beer per week   Comment: 4 gallons a day     Social History   Substance and Sexual Activity  Drug Use Yes  . Types: Marijuana   Comment: 2-3 joints weekly    Social History   Socioeconomic History  . Marital status: Single    Spouse name: Not on file  . Number of children: 0  . Years of education: 2411  . Highest education level: Not on file  Occupational History  . Not on file  Tobacco Use  . Smoking status: Current Every Day Smoker    Packs/day: 1.00    Types: Cigarettes  . Smokeless tobacco: Current User    Types: Chew  Vaping Use  . Vaping Use: Never used  Substance and Sexual Activity  . Alcohol use: Yes    Alcohol/week: 24.0 standard drinks    Types: 24 Cans of beer per week    Comment: 4 gallons a day  . Drug use: Yes    Types: Marijuana    Comment: 2-3 joints weekly  . Sexual activity: Not Currently  Other Topics Concern  . Not on file  Social History Narrative  . Not on file   Social Determinants of Health   Financial Resource Strain: Not on file  Food Insecurity: Not on file  Transportation Needs: Not on file  Physical Activity: Not on file  Stress: Not on file  Social Connections: Not on file   Additional Social History:  Sleep: Poor  Appetite:  Fair  Current Medications: Current Facility-Administered Medications  Medication Dose Route Frequency Provider Last Rate Last Admin  . acetaminophen (TYLENOL) tablet 650 mg  650 mg Oral Q6H PRN Clapacs, Jackquline Denmark, MD   650 mg at 02/06/21 9233  . alum & mag hydroxide-simeth (MAALOX/MYLANTA) 200-200-20 MG/5ML suspension 30 mL  30 mL Oral Q4H PRN Clapacs, John T, MD      . feeding supplement (ENSURE ENLIVE / ENSURE PLUS) liquid 237 mL  237 mL Oral TID BM Jesse Sans, MD   237 mL at 02/06/21 1335  . [START ON 02/07/2021] folic acid (FOLVITE) tablet 1 mg  1 mg Oral Daily Jesse Sans, MD      . hydrOXYzine (ATARAX/VISTARIL) tablet 50 mg  50 mg Oral TID  PRN Clapacs, Jackquline Denmark, MD   50 mg at 02/05/21 2123  . LORazepam (ATIVAN) injection 0-4 mg  0-4 mg Intramuscular Q12H Jesse Sans, MD       Or  . LORazepam (ATIVAN) tablet 0-4 mg  0-4 mg Oral Q12H Jesse Sans, MD   2 mg at 02/06/21 1315  . magnesium hydroxide (MILK OF MAGNESIA) suspension 30 mL  30 mL Oral Daily PRN Clapacs, Jackquline Denmark, MD      . Melene Muller ON 02/07/2021] multivitamin with minerals tablet 1 tablet  1 tablet Oral Daily Jesse Sans, MD      . nicotine polacrilex (NICORETTE) gum 2 mg  2 mg Oral Q4H PRN Jesse Sans, MD   2 mg at 02/06/21 0818  . thiamine tablet 100 mg  100 mg Oral Daily Clapacs, Jackquline Denmark, MD   100 mg at 02/06/21 0818   Or  . thiamine (B-1) injection 100 mg  100 mg Intravenous Daily Clapacs, John T, MD      . traZODone (DESYREL) tablet 100 mg  100 mg Oral QHS PRN Clapacs, Jackquline Denmark, MD        Lab Results:  Results for orders placed or performed during the hospital encounter of 02/05/21 (from the past 48 hour(s))  Lipid panel     Status: None   Collection Time: 02/06/21  7:23 AM  Result Value Ref Range   Cholesterol 139 0 - 200 mg/dL   Triglycerides 55 <007 mg/dL   HDL 55 >62 mg/dL   Total CHOL/HDL Ratio 2.5 RATIO   VLDL 11 0 - 40 mg/dL   LDL Cholesterol 73 0 - 99 mg/dL    Comment:        Total Cholesterol/HDL:CHD Risk Coronary Heart Disease Risk Table                     Men   Women  1/2 Average Risk   3.4   3.3  Average Risk       5.0   4.4  2 X Average Risk   9.6   7.1  3 X Average Risk  23.4   11.0        Use the calculated Patient Ratio above and the CHD Risk Table to determine the patient's CHD Risk.        ATP III CLASSIFICATION (LDL):  <100     mg/dL   Optimal  263-335  mg/dL   Near or Above                    Optimal  130-159  mg/dL   Borderline  456-256  mg/dL   High  >  190     mg/dL   Very High Performed at [facility], [address] [place], [place] [address]     Blood Alcohol level:  Lab Results  Component Value  Date   [facility] <10 02/03/2021   ETH <10 08/27/2020    Metabolic Disorder Labs: Lab Results  Component Value Date   HGBA1C 5.0 04/23/2016   Lab Results  Component Value Date   PROLACTIN 7.0 04/23/2016   Lab Results  Component Value Date   CHOL 139 02/06/2021   TRIG 55 02/06/2021   HDL 55 02/06/2021   CHOLHDL 2.5 02/06/2021   VLDL 11 02/06/2021   LDLCALC 73 02/06/2021   LDLCALC 105 (H) 04/23/2016    Physical Findings: AIMS:  , ,  ,  ,    CIWA:  CIWA-Ar Total: 5 COWS:     Musculoskeletal: Strength & Muscle Tone: within normal limits Gait & Station: normal Patient leans: N/A  Psychiatric Specialty Exam: Physical Exam Vitals and nursing note reviewed.  Constitutional:      Appearance: Normal appearance.  HENT:     Head: Normocephalic and atraumatic.     Right Ear: External ear normal.     Left Ear: External ear normal.     Nose: Nose normal.     Mouth/Throat:     Mouth: Mucous membranes are moist.     Pharynx: Oropharynx is clear.  Eyes:     Extraocular Movements: Extraocular movements intact.     Conjunctiva/sclera: Conjunctivae normal.     Pupils: Pupils are equal, round, and reactive to light.  Cardiovascular:     Rate and Rhythm: Normal rate.     Pulses: Normal pulses.  Pulmonary:     Effort: Pulmonary effort is normal.  Abdominal:     General: Abdomen is flat.     Palpations: Abdomen is soft.  Musculoskeletal:        General: No swelling. Normal range of motion.     Cervical back: Normal range of motion and neck supple.  Skin:    General: Skin is warm and dry.  Neurological:     General: No focal deficit present.     Mental Status: He is alert and oriented to person, place, and time.  Psychiatric:        Attention and Perception: Attention and perception normal.        Mood and Affect: Mood is anxious.        Speech: Speech is rapid and pressured.        Behavior: Behavior is uncooperative and agitated.        Thought Content: Thought content is  paranoid. Thought content does not include homicidal or suicidal ideation.        Cognition and Memory: Cognition normal. Memory is impaired.        Judgment: Judgment is impulsive.     Review of Systems  Constitutional: Negative for chills and fatigue.  HENT: Negative for rhinorrhea and sore throat.   Eyes: Negative for photophobia and visual disturbance.  Respiratory: Negative for cough and shortness of breath.   Cardiovascular: Negative for chest pain and palpitations.  Gastrointestinal: Negative for constipation, diarrhea, nausea and vomiting.  Endocrine: Negative for cold intolerance and heat intolerance.  Genitourinary: Negative for difficulty urinating and dysuria.  Musculoskeletal: Negative for arthralgias and myalgias.  Skin: Negative for rash and wound.  Allergic/Immunologic: Positive for environmental allergies and food allergies.  Neurological: Positive for headaches. Negative for dizziness.  Hematological: Negative for adenopathy.  Does not bruise/bleed easily.  Psychiatric/Behavioral: Positive for agitation, behavioral problems and sleep disturbance. Negative for dysphoric mood and suicidal ideas. The patient is not nervous/anxious.     Blood pressure (!) 128/95, pulse 77, temperature 97.7 F (36.5 C), temperature source Oral, resp. rate 17, height 5\' 9"  (1.753 m), weight 59 kg, SpO2 99 %.Body mass index is 19.2 kg/m.  General Appearance: Disheveled  Eye Contact:  glaring  Speech:  Pressured  Volume:  Normal  Mood:  Irritable  Affect:  Congruent  Thought Process:  Linear  Orientation:  Full (Time, Place, and Person)  Thought Content:  Rumination  Suicidal Thoughts:  No  Homicidal Thoughts:  No  Memory:  Immediate;   Fair  Judgement:  Impaired  Insight:  Shallow  Psychomotor Activity:  Increased  Concentration:  Concentration: Poor  Recall:  of Knowledge:  Fair  Language:  Fair  Akathisia:  Negative  Handed:  Right  AIMS (if indicated):     Assets:   Desire for Improvement Physical Health  ADL's:  Intact  Cognition:  WNL  Sleep:  Number of Hours: 7     Treatment Plan Summary: Daily contact with patient to assess and evaluate symptoms and progress in treatment and Medication management  1) Bipolar I Disorder, mixed, severe vs MDD, recurrent, severe- established problem, unstable, vs. Substance induced mood disorder - Patient denying suicidal ideations today.  Feels his symptoms worsened with Geodon, Prozac, Depakote, Vistaril, Seroquel, Abilify, and Zoloft  - Discontinue Latuda 20 mg due to patient reported side effects and worsening moood - Patient declining to start an alternate medication at this time - Lipid panel within normal limits  2) Alcohol Use Disorder, severe- established problem, unstable - CIWA protocol in place with PRN Ativan - Folic acid, multivitamin, and thiamine replacement - Patient requesting detox resources   3) Tobacco Use Disorder- established problem - Nicorette gum   4)Cannabis Use Disorder- established problem - Cessation counseling  [employer], MD 02/06/2021, 2:20 PM

## 2021-02-06 NOTE — Progress Notes (Signed)
Recreation Therapy Notes  Date: 02/06/2021  Time: 9:30 am   Location: Craft room     Behavioral response: N/A   Intervention Topic: Leisure    Discussion/Intervention: Patient did not attend group.   Clinical Observations/Feedback:  Patient did not attend group.   Cosette Prindle LRT/CTRS         Inioluwa Boulay 02/06/2021 10:45 AM

## 2021-02-06 NOTE — [facility] Suicide Risk Assessment (Signed)
[facility] INPATIENT:  Family/Significant Other Suicide Prevention Education  Suicide Prevention Education:  Education Completed; Marylene Land Oleksy/mother [phone]), has been identified by the patient as the family member/significant other with whom the patient will be residing, and identified as the person(s) who will aid the patient in the event of a mental health crisis (suicidal ideations/suicide attempt).  With written consent from the patient, the family member/significant other has been provided the following suicide prevention education, prior to the and/or following the discharge of the patient.  The suicide prevention education provided includes the following:  Suicide risk factors  Suicide prevention and interventions  National Suicide Hotline telephone number  [facility] assessment telephone number  [facility] Emergency Assistance 911  [facility] and/or Residential Mobile Crisis Unit telephone number  Request made of family/significant other to:  Remove weapons (e.g., guns, rifles, knives), all items previously/currently identified as safety concern.    Remove drugs/medications (over-the-counter, prescriptions, illicit drugs), all items previously/currently identified as a safety concern.  The family member/significant other verbalizes understanding of the suicide prevention education information provided.  The family member/significant other agrees to remove the items of safety concern listed above.  ADewaine Conger stated that pt came to hospital due to "him being an alcoholic, constantly drinking, getting him down." She stated that pt wanted help. She denied him being a danger to himself or anyone else or having access to any weapons. [place] stated plans to pick pt up on Monday, 02/09/21 and stated that she hoped it was around 8am as she had an appointment later in the day. CSW stated that she could come pick pt up after her appointment and that discharge would not  happen at 8:00am on Monday. She agreed. No other concerns expressed. Contact ended without incident.   Glenis Smoker 02/06/2021, 4:21 PM
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
## 2021-02-06 NOTE — [facility] Group Notes (Signed)
LCSW Group Therapy Note     02/06/2021 2:37 PM     Type of Therapy and Topic:  Group Therapy:  Feelings around Relapse and Recovery     Participation Level:  None     Description of Group:    Patients in this group will discuss emotions they experience before and after a relapse. They will process how experiencing these feelings, or avoidance of experiencing them, relates to having a relapse. Facilitator will guide patients to explore emotions they have related to recovery. Patients will be encouraged to process which emotions are more powerful. They will be guided to discuss the emotional reaction significant others in their lives may have to their relapse or recovery. Patients will be assisted in exploring ways to respond to the emotions of others without this contributing to a relapse.     Therapeutic Goals:  1.    Patient will identify two or more emotions that lead to a relapse for them  2.    Patient will identify two emotions that result when they relapse  3.    Patient will identify two emotions related to recovery  4.    Patient will demonstrate ability to communicate their needs through discussion and/or role plays        Summary of Patient Progress: Pt arrived at group with a few minutes left and did not discuss anything during the remaining time.        Therapeutic Modalities:   Cognitive Behavioral Therapy  Solution-Focused Therapy  Assertiveness Training  Relapse Prevention Therapy        Yeison Sippel [place], MSW, LCSW-A  02/06/2021 2:37 PM

## 2021-02-06 NOTE — Tx Team (Addendum)
Interdisciplinary Treatment and Diagnostic Plan Update  02/06/2021 Time of Session: 09:00 Nathan [name] MRN: [id number]  Principal Diagnosis: Bipolar 1 disorder, mixed, severe ([place])  Secondary Diagnoses: Principal Problem:   Bipolar 1 disorder, mixed, severe ([place]) Active Problems:   Tobacco use disorder   Alcohol use disorder, severe, dependence ([place])   Cannabis use disorder, moderate, in controlled environment ([place])   Current Medications:  Current Facility-Administered Medications  Medication Dose Route Frequency Provider Last Rate Last Admin  . acetaminophen (TYLENOL) tablet 650 mg  650 mg Oral Q6H PRN Clapacs, Madie Reno, MD   650 mg at 02/06/21 9798  . alum & mag hydroxide-simeth (MAALOX/MYLANTA) 200-200-20 MG/5ML suspension 30 mL  30 mL Oral Q4H PRN Clapacs, John T, MD      . chlordiazePOXIDE (LIBRIUM) capsule 25 mg  25 mg Oral TID Clapacs, John T, MD   25 mg at 02/06/21 0818  . hydrOXYzine (ATARAX/VISTARIL) tablet 50 mg  50 mg Oral TID PRN Clapacs, Madie Reno, MD   50 mg at 02/05/21 2123  . LORazepam (ATIVAN) injection 0-4 mg  0-4 mg Intramuscular Q12H Salley Scarlet, MD       Or  . LORazepam (ATIVAN) tablet 0-4 mg  0-4 mg Oral Q12H Salley Scarlet, MD   2 mg at 02/06/21 0600  . lurasidone (LATUDA) tablet 20 mg  20 mg Oral Q supper Salley Scarlet, MD   20 mg at 02/05/21 1658  . magnesium hydroxide (MILK OF MAGNESIA) suspension 30 mL  30 mL Oral Daily PRN Clapacs, John T, MD      . nicotine polacrilex (NICORETTE) gum 2 mg  2 mg Oral Q4H PRN Salley Scarlet, MD   2 mg at 02/06/21 0818  . thiamine tablet 100 mg  100 mg Oral Daily Clapacs, Madie Reno, MD   100 mg at 02/06/21 0818   Or  . thiamine (B-1) injection 100 mg  100 mg Intravenous Daily Clapacs, John T, MD      . traZODone (DESYREL) tablet 100 mg  100 mg Oral QHS PRN Clapacs, Madie Reno, MD       PTA Medications: Medications Prior to Admission  Medication Sig Dispense Refill Last Dose  . hydrOXYzine (ATARAX/VISTARIL) 25 MG  tablet Take 1 tablet (25 mg total) by mouth 3 (three) times daily as needed for anxiety. (Patient not taking: Reported on 02/03/2021) 30 tablet 0   . hydrOXYzine (ATARAX/VISTARIL) 50 MG tablet Take 1 tablet (50 mg total) by mouth at bedtime. (Patient not taking: Reported on 02/03/2021) 30 tablet 0   . nicotine polacrilex (NICORETTE) 2 MG gum Take 1 each (2 mg total) by mouth as needed for smoking cessation. (Patient not taking: Reported on 02/03/2021) 100 tablet 0   . pantoprazole (PROTONIX) 40 MG tablet Take 1 tablet (40 mg total) by mouth daily. (Patient not taking: Reported on 02/03/2021) 30 tablet 0   . risperiDONE (RISPERDAL) 3 MG tablet Take 1 tablet (3 mg total) by mouth at bedtime. (Patient not taking: Reported on 02/03/2021) 30 tablet 0     Patient Stressors: Financial difficulties Substance abuse  Patient Strengths: Supportive family/friends  Treatment Modalities: Medication Management, Group therapy, Case management,  1 to 1 session with clinician, Psychoeducation, Recreational therapy.   Physician Treatment Plan for Primary Diagnosis: Bipolar 1 disorder, mixed, severe ([place]) Long Term Goal(s): Improvement in symptoms so as ready for discharge Improvement in symptoms so as ready for discharge   Short Term Goals: Ability to identify changes in  lifestyle to reduce recurrence of condition will improve Ability to verbalize feelings will improve Ability to disclose and discuss suicidal ideas Ability to demonstrate self-control will improve Ability to identify and develop effective coping behaviors will improve Compliance with prescribed medications will improve Ability to identify triggers associated with substance abuse/mental health issues will improve Ability to identify changes in lifestyle to reduce recurrence of condition will improve Ability to verbalize feelings will improve Ability to disclose and discuss suicidal ideas Ability to demonstrate self-control will improve Ability  to identify and develop effective coping behaviors will improve Compliance with prescribed medications will improve Ability to identify triggers associated with substance abuse/mental health issues will improve  Medication Management: Evaluate patient's response, side effects, and tolerance of medication regimen.  Therapeutic Interventions: 1 to 1 sessions, Unit Group sessions and Medication administration.  Evaluation of Outcomes: Not Met  Physician Treatment Plan for Secondary Diagnosis: Principal Problem:   Bipolar 1 disorder, mixed, severe ([place]) Active Problems:   Tobacco use disorder   Alcohol use disorder, severe, dependence ([place])   Cannabis use disorder, moderate, in controlled environment ([place])  Long Term Goal(s): Improvement in symptoms so as ready for discharge Improvement in symptoms so as ready for discharge   Short Term Goals: Ability to identify changes in lifestyle to reduce recurrence of condition will improve Ability to verbalize feelings will improve Ability to disclose and discuss suicidal ideas Ability to demonstrate self-control will improve Ability to identify and develop effective coping behaviors will improve Compliance with prescribed medications will improve Ability to identify triggers associated with substance abuse/mental health issues will improve Ability to identify changes in lifestyle to reduce recurrence of condition will improve Ability to verbalize feelings will improve Ability to disclose and discuss suicidal ideas Ability to demonstrate self-control will improve Ability to identify and develop effective coping behaviors will improve Compliance with prescribed medications will improve Ability to identify triggers associated with substance abuse/mental health issues will improve     Medication Management: Evaluate patient's response, side effects, and tolerance of medication regimen.  Therapeutic Interventions: 1 to 1 sessions, Unit Group  sessions and Medication administration.  Evaluation of Outcomes: Not Met   RN Treatment Plan for Primary Diagnosis: Bipolar 1 disorder, mixed, severe ([place]) Long Term Goal(s): Knowledge of disease and therapeutic regimen to maintain health will improve  Short Term Goals: Ability to remain free from injury will improve, Ability to demonstrate self-control, Ability to participate in decision making will improve, Ability to verbalize feelings will improve, Ability to identify and develop effective coping behaviors will improve and Compliance with prescribed medications will improve  Medication Management: RN will administer medications as ordered by provider, will assess and evaluate patient's response and provide education to patient for prescribed medication. RN will report any adverse and/or side effects to prescribing provider.  Therapeutic Interventions: 1 on 1 counseling sessions, Psychoeducation, Medication administration, Evaluate responses to treatment, Monitor vital signs and CBGs as ordered, Perform/monitor CIWA, COWS, AIMS and Fall Risk screenings as ordered, Perform wound care treatments as ordered.  Evaluation of Outcomes: Not Met   LCSW Treatment Plan for Primary Diagnosis: Bipolar 1 disorder, mixed, severe ([place]) Long Term Goal(s): Safe transition to appropriate next level of care at discharge, Engage patient in therapeutic group addressing interpersonal concerns.  Short Term Goals: Engage patient in aftercare planning with referrals and resources, Increase social support, Increase ability to appropriately verbalize feelings, Increase emotional regulation, Facilitate acceptance of mental health diagnosis and concerns, Facilitate patient progression through stages of  change regarding substance use diagnoses and concerns, Identify triggers associated with mental health/substance abuse issues and Increase skills for wellness and recovery  Therapeutic Interventions: Assess for all  discharge needs, 1 to 1 time with Social worker, Explore available resources and support systems, Assess for adequacy in community support network, Educate family and significant other(s) on suicide prevention, Complete Psychosocial Assessment, Interpersonal group therapy.  Evaluation of Outcomes: Not Met   Progress in Treatment: Attending groups: No. Participating in groups: No. Taking medication as prescribed: Yes. Toleration medication: Yes. Family/Significant other contact made: No, will contact:  when given permission. Patient understands diagnosis: Yes. Discussing patient identified problems/goals with staff: No. Medical problems stabilized or resolved: Yes. Denies suicidal/homicidal ideation: No. Issues/concerns per patient self-inventory: No. Other: None  New problem(s) identified: No, Describe:  None.  New Short Term/Long Term Goal(s): detox, medication management for mood stabilization; elimination of SI thoughts; development of comprehensive mental wellness/sobriety plan.  Patient Goals: Invitation was extended for treatment team by nurses but pt declined to participate.    Discharge Plan or Barriers: CSW will assist pt with development of appropriate discharge/aftercare plan.   Reason for Continuation of Hospitalization: Depression Medication stabilization Suicidal ideation Withdrawal symptoms  Estimated Length of Stay: 1-7 days   Recreational Therapy: Patient Stressors: N/A Patient Goal: Patient will engage in groups without prompting or encouragement from LRT x3 group sessions within 5 recreation therapy group sessions.  Attendees: Patient:  02/06/2021 9:18 AM  Physician: Selina Cooley, MD 02/06/2021 9:18 AM  Nursing: West Pugh, RN 02/06/2021 9:18 AM  RN Care Manager: 02/06/2021 9:18 AM  Social Worker: Chalmers Guest. Guerry Bruin, MSW, [place], [place] 02/06/2021 9:18 AM  Recreational Therapist: Devin Going, LRT  02/06/2021 9:18 AM  Other:  02/06/2021 9:18 AM  Other:   02/06/2021 9:18 AM  Other: 02/06/2021 9:18 AM    Scribe for Treatment Team: Shirl Harris, LCSW 02/06/2021 9:18 AM

## 2021-02-07 LAB — HEMOGLOBIN A1C
Hgb A1c MFr Bld: 5.2 % (ref 4.8–5.6)
Mean Plasma Glucose: 103 mg/dL

## 2021-02-07 NOTE — Progress Notes (Signed)
Patient alert and oriented x 4, affect is flat but he brightens upon approach,  thoughts are organized and coherent, he denies SI/HI/AVH he was visible in the milieu interacting appropriately with peers and staff, he's currently on CIWA  Protocol no S/S of withdrawals noted, he was encouraged to drink fluids. 15 minutes safety checks maintained will continue to monitor.

## 2021-02-07 NOTE — Progress Notes (Signed)
[facility]  02/07/2021 12:41 PM Nathan [name]  MRN:  [id number]   CC "I'm fine now."  Subjective:  Pt is s been seen for the first time today. He reviewed events that have transpired prior to his admission. Says he had been drinking heavily and was suicidal as a consequence of this. He has been on many psychiatric medications with poor benefit. The only things that help him are Ativan and cannabis. Occasional use of Delta 8 edibles/gummys.  Reports anger to be problematic for him and hopes to get further care of [place] behavioral care, and possibly anger management. Specifically points out that he is not asking for Xanax which makes him very mean.  No symptoms of the alcohol withdrawal.  Says that he has lost many things:job house and girlfriend.   He is glad to be discharging with his mom on Monday.  Denies depression and anxiety moderate to severe. Denies psychosis. Sleeping good. Appetite has improved  Principal Problem: Bipolar 1 disorder, mixed, severe ([facility]) Diagnosis: Principal Problem:   Bipolar 1 disorder, mixed, severe ([facility]) Active Problems:   Tobacco use disorder   Alcohol use disorder, severe, dependence ([facility])   Cannabis use disorder, moderate, in controlled environment ([facility])  Total Time spent with patient: 30 minutes  Past Psychiatric History: See H&P  Past Medical History:  Past Medical History:  Diagnosis Date  . Anxiety   . Bipolar 1 disorder ([facility])   . Chronic headache   . Depression 03/30/2020  . ETOH abuse 03/30/2020  . 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 Horseshoe kidney   . Marijuana smoker 03/30/2020    Past Surgical History:  Procedure Laterality Date  . TONSILLECTOMY     Family History:  Family History  Problem Relation Age of Onset  . Hypertension Mother   . Diabetes Mother   . Hypertension Father   . Heart failure Father   . Diabetes Father    Family Psychiatric  History: See H&P Social History:  Social History    Substance and Sexual Activity  Alcohol Use Yes  . Alcohol/week: 24.0 standard drinks  . Types: 24 Cans of beer per week   Comment: 4 gallons a day     Social History   Substance and Sexual Activity  Drug Use Yes  . Types: Marijuana   Comment: 2-3 joints weekly    Social History   Socioeconomic History  . Marital status: Single    Spouse name: Not on file  . Number of children: 0  . Years of education: 8311  . Highest education level: Not on file  Occupational History  . Not on file  Tobacco Use  . Smoking status: Current Every Day Smoker    Packs/day: 1.00    Types: Cigarettes  . Smokeless tobacco: Current User    Types: Chew  Vaping Use  . Vaping Use: Never used  Substance and Sexual Activity  . Alcohol use: Yes    Alcohol/week: 24.0 standard drinks    Types: 24 Cans of beer per week    Comment: 4 gallons a day  . Drug use: Yes    Types: Marijuana    Comment: 2-3 joints weekly  . Sexual activity: Not Currently  Other Topics Concern  . Not on file  Social History Narrative  . Not on file   Social Determinants of Health   Financial Resource Strain: Not on file  Food Insecurity: Not on file  Transportation Needs: Not on file  Physical Activity: Not on file  Stress: Not on file  Social Connections: Not on file   Additional Social History:                         Sleep: Poor  Appetite:  Fair  Current Medications: Current Facility-Administered Medications  Medication Dose Route Frequency Provider Last Rate Last Admin  . acetaminophen (TYLENOL) tablet 650 mg  650 mg Oral Q6H PRN Clapacs, Jackquline Denmark, MD   650 mg at 02/07/21 0843  . alum & mag hydroxide-simeth (MAALOX/MYLANTA) 200-200-20 MG/5ML suspension 30 mL  30 mL Oral Q4H PRN Clapacs, John T, MD      . feeding supplement (ENSURE ENLIVE / ENSURE PLUS) liquid 237 mL  237 mL Oral TID BM Jesse Sans, MD   237 mL at 02/07/21 1000  . folic acid (FOLVITE) tablet 1 mg  1 mg Oral Daily Jesse Sans, MD   1 mg at 02/07/21 1660  . hydrOXYzine (ATARAX/VISTARIL) tablet 50 mg  50 mg Oral TID PRN Clapacs, Jackquline Denmark, MD   50 mg at 02/06/21 1938  . magnesium hydroxide (MILK OF MAGNESIA) suspension 30 mL  30 mL Oral Daily PRN Clapacs, John T, MD      . multivitamin with minerals tablet 1 tablet  1 tablet Oral Daily Jesse Sans, MD   1 tablet at 02/07/21 [phone]  . nicotine polacrilex (NICORETTE) gum 2 mg  2 mg Oral Q4H PRN Jesse Sans, MD   2 mg at 02/07/21 0843  . thiamine tablet 100 mg  100 mg Oral Daily Clapacs, Jackquline Denmark, MD   100 mg at 02/07/21 6010   Or  . thiamine (B-1) injection 100 mg  100 mg Intravenous Daily Clapacs, Jackquline Denmark, MD      . traZODone (DESYREL) tablet 100 mg  100 mg Oral QHS PRN Clapacs, Jackquline Denmark, MD   100 mg at 02/06/21 2102    Lab Results:  Results for orders placed or performed during the hospital encounter of 02/05/21 (from the past 48 hour(s))  Lipid panel     Status: None   Collection Time: 02/06/21  7:23 AM  Result Value Ref Range   Cholesterol 139 0 - 200 mg/dL   Triglycerides 55 <932 mg/dL   HDL 55 >35 mg/dL   Total CHOL/HDL Ratio 2.5 RATIO   VLDL 11 0 - 40 mg/dL   LDL Cholesterol 73 0 - 99 mg/dL    Comment:        Total Cholesterol/HDL:CHD Risk Coronary Heart Disease Risk Table                     Men   Women  1/2 Average Risk   3.4   3.3  Average Risk       5.0   4.4  2 X Average Risk   9.6   7.1  3 X Average Risk  23.4   11.0        Use the calculated Patient Ratio above and the CHD Risk Table to determine the patient's CHD Risk.        ATP III CLASSIFICATION (LDL):  <100     mg/dL   Optimal  573-220  mg/dL   Near or Above                    Optimal  130-159  mg/dL   Borderline  254-270  mg/dL  High  >190     mg/dL   Very High Performed at [facility], [address] [place], [place] [address]   Hemoglobin A1c     Status: None   Collection Time: 02/06/21  7:23 AM  Result Value Ref Range   Hgb A1c MFr Bld 5.2 4.8 - 5.6 %     Comment: (NOTE)         Prediabetes: 5.7 - 6.4         Diabetes: >6.4         Glycemic control for adults with diabetes: <7.0    Mean Plasma Glucose 103 mg/dL    Comment: (NOTE) Performed At: [facility] [address] [place], [place] [id number] Jolene Schimke MD VE:[id number]     Blood Alcohol level:  Lab Results  Component Value Date   [facility] <10 02/03/2021   ETH <10 08/27/2020    Metabolic Disorder Labs: Lab Results  Component Value Date   HGBA1C 5.2 02/06/2021   MPG 103 02/06/2021   Lab Results  Component Value Date   PROLACTIN 7.0 04/23/2016   Lab Results  Component Value Date   CHOL 139 02/06/2021   TRIG 55 02/06/2021   HDL 55 02/06/2021   CHOLHDL 2.5 02/06/2021   VLDL 11 02/06/2021   LDLCALC 73 02/06/2021   LDLCALC 105 (H) 04/23/2016    Physical Findings: AIMS:  , ,  ,  ,    CIWA:  CIWA-Ar Total: 2 COWS:     Musculoskeletal: Strength & Muscle Tone: within normal limits Gait & Station: normal Patient leans: N/A  Psychiatric Specialty Exam:     Blood pressure (!) 133/93, pulse 68, temperature 97.8 F (36.6 C), temperature source Oral, resp. rate 18, height 5\' 9"  (1.753 m), weight 59 kg, SpO2 100 %.Body mass index is 19.2 kg/m.  General Appearance: Disheveled  Eye Contact:  wnl  Speech:  Pressured  Volume:  Normal  Mood:  fine  Affect:  Congruent  Thought Process:  Linear  Orientation:  Full (Time, Place, and Person)  Thought Content:  Rumination  Suicidal Thoughts:  No  Homicidal Thoughts:  No  Memory:  Immediate;   Fair  Judgement:  Impaired  Insight:  Shallow  Psychomotor Activity:  Increased  Concentration:  Concentration: Poor  Recall:  of Knowledge:  Fair  Language:  Fair  Akathisia:  Negative  Handed:  Right  AIMS (if indicated):     Assets:  Desire for Improvement Physical Health  ADL's:  Intact  Cognition:  WNL  Sleep:  Number of Hours: 4.75     Treatment Plan Summary: Daily contact with patient  to assess and evaluate symptoms and progress in treatment and Medication management  1) Bipolar I Disorder, mixed, severe vs MDD, recurrent, severe- established problem, unstable, vs. Substance induced mood disorder - Patient denying suicidal ideations today.  Feels his symptoms worsened with Geodon, Prozac, Depakote, Vistaril, Seroquel, Abilify, and Zoloft  - Discontinue Latuda 20 mg due to patient reported side effects and worsening moood - Patient declining to start an alternate medication at this time - Lipid panel within normal limits  2) Alcohol Use Disorder, severe- established problem, unstable - CIWA protocol in place with PRN Ativan - Folic acid, multivitamin, and thiamine replacement - Patient requesting detox resources   3) Tobacco Use Disorder- established problem - Nicorette gum   4)Cannabis Use Disorder- established problem - Cessation counseling  2/19 Will consider d/c of CIWA tomorrow  CPT: [phone]  Reggie Pile, MD 02/07/2021, 12:41 PM

## 2021-02-07 NOTE — Plan of Care (Signed)
Patient is out in the milieu, cooperative and compliant with unit expectations. Alert and oriented. Denying thoughts of self-harm. Focused on upcoming discharge: reports that he will be discharged Monday and will go to a treatment facility with his mother who also has a substance use problem. Patient complained of moderate headache and  received Tylenol. Had no additional concerns. Encouragements provided and safety maintained.

## 2021-02-07 NOTE — [facility] Counselor (Signed)
CSW provided patient with information on how to apply for Medicaid.

## 2021-02-07 NOTE — [facility] Group Notes (Signed)
  [facility] LCSW Group Therapy Note  Date/Time:  02/07/2021 1:44 PM- 2:57 PM   Type of Therapy and Topic:  Group Therapy:  Feelings About Hospitalization  Participation Level:  Active   Description of Group This process group involved patients discussing their feelings related to being hospitalized, as well as the benefits they see to being in the hospital.  These feelings and benefits were itemized.  The group then brainstormed specific ways in which they could seek those same benefits when they discharge and return home.  Therapeutic Goals 1. Patient will identify and describe positive and negative feelings related to hospitalization 2. Patient will verbalize benefits of hospitalization to themselves personally 3. Patients will brainstorm together ways they can obtain similar benefits in the outpatient setting, identify barriers to wellness and possible solutions  Summary of Patient Progress: Patient spoke about being sober is a positive for him and being discharged on Monday. Patient stated that sometimes patients are ignored as a negative. Patient stated his plan is to get health insurance when he is discharged.   Therapeutic Modalities Cognitive Behavioral Therapy Motivational Interviewing    Susa Simmonds, Theresia Majors 02/07/2021  4:33 PM

## 2021-02-08 MED ORDER — CALCIUM CARBONATE ANTACID 500 MG PO CHEW
1.0000 | CHEWABLE_TABLET | ORAL | Status: DC | PRN
  Administered 2021-02-08: 200 mg via ORAL
  Filled 2021-02-08: qty 1

## 2021-02-08 NOTE — [facility] Group Notes (Signed)
LCSW Group Therapy Note  02/08/2021   1:00 - 2:00 PM  Type of Therapy and Topic:  Group Therapy: Anger Cues and Responses  Participation Level:  Active   Description of Group:   In this group, patients learned how to recognize the physical, cognitive, emotional, and behavioral responses they have to anger-provoking situations.  They identified a recent time they became angry and how they reacted.  They analyzed how their reaction was possibly beneficial and how it was possibly unhelpful.  The group discussed a variety of healthier coping skills that could help with such a situation in the future.  Focus was placed on how helpful it is to recognize the underlying emotions to our anger, because working on those can lead to a more permanent solution as well as our ability to focus on the important rather than the urgent.  Therapeutic Goals: 1. Patients will remember their last incident of anger and how they felt emotionally and physically, what their thoughts were at the time, and how they behaved. 2. Patients will identify how their behavior at that time worked for them, as well as how it worked against them. 3. Patients will explore possible new behaviors to use in future anger situations. 4. Patients will learn that anger itself is normal and cannot be eliminated, and that healthier reactions can assist with resolving conflict rather than worsening situations.  Summary of Patient Progress:  The patient hit the table when CSW announced today's topic was anger. Patient stated he was right where he needed to be and it was something that he is working on as he has a bad temper. Patient shared the last time he was angry was today when he put the menus down and requested a Gatorade and was told to drink water. Patient reports I know I can drink water but why did they have to be snarky. Patient reports this one staff member picks at others and with his discharge tomorrow he thought about getting out and  drinking. Pt reports his peers helped calm him down and get through the situation. Patient talked about how anger can eat away at a person and can actually make a person sick or kill them. Patient reports deep breathing does not work for him it makes him more mad. Patient appeared to be upset when given a handout, stating he cannot read and would just share with someone. Patient did not share anything new he plans to try from this group.   Therapeutic Modalities:   Cognitive Behavioral Therapy  Shellia Cleverly

## 2021-02-08 NOTE — [facility] Group Notes (Signed)
[facility] Group Notes:  (Nursing/MHT/Case Management/Adjunct)  Date:  02/08/2021  Time:  9:04 PM  Type of Therapy:  Group Therapy  Participation Level:  Active  Participation Quality:  Appropriate  Affect:  Appropriate  Cognitive:  Alert  Insight:  Good  Engagement in Group:  Engaged and talk about being discharging tomorrow.  Modes of Intervention:  Support  Summary of Progress/Problems:  Nathan [name] 02/08/2021, 9:04 PM

## 2021-02-08 NOTE — Progress Notes (Signed)
[facility] MD Progress Note  02/08/2021 9:54 AM Nathan [name]  MRN:  [id number]   CC "I'm good!"  Subjective:  2/20 Early this morning, patient complained that he did not have any Ativan yesterday evening. He was informed that he was not scoring on the alcohol withdrawal protocol and no clinical signs of withdrawals were appreciated. Discussed this with the nurse yesterday as well.He thought this would be tapered off and we reviewed that it is on a scored basis. Surprisingly, he is very much accepting of this.   Says that he is feeling very well, "I'm good!," and is looking forward to seeing his six-year-old son and take him to [employer]. As well as spending time with his mothe. 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 He wants a script of trazodone upon discharge. Trying to get on government-based insurance. We talked about trazodone being on the four dollars list at [employer] through good http://www.cox-reed.biz/. Continues to deny tremors, diaphoresis, headaches dizziness blurred vision hallucinations or Ataxia. No s/i, hj/i.   2/19 Pt is s been seen for the first time today. He reviewed events that have transpired prior to his admission. Says he had been drinking heavily and was suicidal as a consequence of this. He has been on many psychiatric medications with poor benefit. The only things that help him are Ativan and cannabis. Occasional use of Delta 8 edibles/gummys.  Reports anger to be problematic for him and hopes to get further care of [place] [facility], and possibly anger management. Specifically points out that he is not asking for Xanax which makes him very mean.  No symptoms of the alcohol withdrawal.  Says that he has lost many things:job house and girlfriend.   He is glad to be discharging with his mom on Monday.  Denies depression and anxiety moderate to severe. Denies psychosis. Sleeping good. Appetite has improved  Principal Problem: Bipolar 1  disorder, mixed, severe (HCC) Diagnosis: Principal Problem:   Bipolar 1 disorder, mixed, severe (HCC) Active Problems:   Tobacco use disorder   Alcohol use disorder, severe, dependence (HCC)   Cannabis use disorder, moderate, in controlled environment (HCC)  Total Time spent with patient: 30 minutes  Past Psychiatric History: See H&P  Past Medical History:  Past Medical History:  Diagnosis Date  . Anxiety   . Bipolar 1 disorder (HCC)   . Chronic headache   . Depression 03/30/2020  . ETOH abuse 03/30/2020  . 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 Horseshoe kidney   . Marijuana smoker 03/30/2020    Past Surgical History:  Procedure Laterality Date  . TONSILLECTOMY     Family History:  Family History  Problem Relation Age of Onset  . Hypertension Mother   . Diabetes Mother   . Hypertension Father   . Heart failure Father   . Diabetes Father    Family Psychiatric  History: See H&P Social History:  Social History   Substance and Sexual Activity  Alcohol Use Yes  . Alcohol/week: 24.0 standard drinks  . Types: 24 Cans of beer per week   Comment: 4 gallons a day     Social History   Substance and Sexual Activity  Drug Use Yes  . Types: Marijuana   Comment: 2-3 joints weekly    Social History   Socioeconomic History  . Marital status: Single    Spouse name: Not on file  . Number of children: 0  . Years of  education: 36  . Highest education level: Not on file  Occupational History  . Not on file  Tobacco Use  . Smoking status: Current Every Day Smoker    Packs/day: 1.00    Types: Cigarettes  . Smokeless tobacco: Current User    Types: Chew  Vaping Use  . Vaping Use: Never used  Substance and Sexual Activity  . Alcohol use: Yes    Alcohol/week: 24.0 standard drinks    Types: 24 Cans of beer per week    Comment: 4 gallons a day  . Drug use: Yes    Types: Marijuana    Comment: 2-3 joints weekly  . Sexual activity: Not Currently  Other Topics Concern  .  Not on file  Social History Narrative  . Not on file   Social Determinants of Health   Financial Resource Strain: Not on file  Food Insecurity: Not on file  Transportation Needs: Not on file  Physical Activity: Not on file  Stress: Not on file  Social Connections: Not on file   Additional Social History:                         Sleep: Poor  Appetite:  Fair  Current Medications: Current Facility-Administered Medications  Medication Dose Route Frequency Provider Last Rate Last Admin  . acetaminophen (TYLENOL) tablet 650 mg  650 mg Oral Q6H PRN Clapacs, Jackquline Denmark, MD   650 mg at 02/08/21 0813  . alum & mag hydroxide-simeth (MAALOX/MYLANTA) 200-200-20 MG/5ML suspension 30 mL  30 mL Oral Q4H PRN Clapacs, John T, MD      . calcium carbonate (TUMS - dosed in mg elemental calcium) chewable tablet 200 mg of elemental calcium  1 tablet Oral Q4H PRN Reggie Pile, MD      . feeding supplement (ENSURE ENLIVE / ENSURE PLUS) liquid 237 mL  237 mL Oral TID BM Jesse Sans, MD   237 mL at 02/08/21 0932  . hydrOXYzine (ATARAX/VISTARIL) tablet 50 mg  50 mg Oral TID PRN Clapacs, Jackquline Denmark, MD   50 mg at 02/07/21 2103  . magnesium hydroxide (MILK OF MAGNESIA) suspension 30 mL  30 mL Oral Daily PRN Clapacs, John T, MD      . multivitamin with minerals tablet 1 tablet  1 tablet Oral Daily Jesse Sans, MD   1 tablet at 02/08/21 [phone]  . nicotine polacrilex (NICORETTE) gum 2 mg  2 mg Oral Q4H PRN Jesse Sans, MD   2 mg at 02/08/21 0941  . traZODone (DESYREL) tablet 100 mg  100 mg Oral QHS PRN Clapacs, Jackquline Denmark, MD   100 mg at 02/07/21 2103    Lab Results:  No results found for this or any previous visit (from the past 48 hour(s)).  Blood Alcohol level:  Lab Results  Component Value Date   ETH <10 02/03/2021   ETH <10 08/27/2020    Metabolic Disorder Labs: Lab Results  Component Value Date   HGBA1C 5.2 02/06/2021   MPG 103 02/06/2021   Lab Results  Component Value Date    PROLACTIN 7.0 04/23/2016   Lab Results  Component Value Date   CHOL 139 02/06/2021   TRIG 55 02/06/2021   HDL 55 02/06/2021   CHOLHDL 2.5 02/06/2021   VLDL 11 02/06/2021   LDLCALC 73 02/06/2021   LDLCALC 105 (H) 04/23/2016    Physical Findings: AIMS:  , ,  ,  ,    CIWA:  CIWA-Ar Total: 2 COWS:     Musculoskeletal: Strength & Muscle Tone: within normal limits Gait & Station: normal Patient leans: N/A  Psychiatric Specialty Exam:     Blood pressure (!) 130/95, pulse 77, temperature 97.8 F (36.6 C), temperature source Oral, resp. rate 18, height 5\' 9"  (1.753 m), weight 59 kg, SpO2 100 %.Body mass index is 19.2 kg/m.  General Appearance: Good grooming  Eye Contact:  wnl  Speech:  wnl  Volume:  Normal  Mood:  fine  Affect:  Bright  Thought Process:  Linear  Orientation:  Full (Time, Place, and Person)  Thought Content:  Rumination  Suicidal Thoughts:  No  Homicidal Thoughts:  No  Memory:  Immediate;   Fair  Judgement:  Impaired  Insight:  Shallow  Psychomotor Activity:  Increased  Concentration:  Concentration: Poor  Recall:  of Knowledge:  Fair  Language:  Fair  Akathisia:  Negative  Handed:  Right  AIMS (if indicated):     Assets:  Desire for Improvement Physical Health  ADL's:  Intact  Cognition:  WNL  Sleep:  Number of Hours: 6.45     Treatment Plan Summary: Daily contact with patient to assess and evaluate symptoms and progress in treatment and Medication management  1) Bipolar I Disorder, mixed, severe vs MDD, recurrent, severe- established problem, unstable, vs. Substance induced mood disorder - Patient denying suicidal ideations today.  Feels his symptoms worsened with Geodon, Prozac, Depakote, Vistaril, Seroquel, Abilify, and Zoloft  - Discontinue Latuda 20 mg due to patient reported side effects and worsening moood - Patient declining to start an alternate medication at this time - Lipid panel within normal limits  2) Alcohol Use  Disorder, severe- established problem, unstable - CIWA protocol in place with PRN Ativan - Folic acid, multivitamin, and thiamine replacement - Patient requesting detox resources   3) Tobacco Use Disorder- established problem - Nicorette gum   4)Cannabis Use Disorder- established problem - Cessation counseling  2/19 Will consider d/c of CIWA tomorrow Update: CIWA was discontinued today  2/20 Add PRN TUMS for indigestion, per pt request.    CPT: 3/20   [id number], MD 02/08/2021, 9:54 AM

## 2021-02-08 NOTE — Progress Notes (Signed)
Patient pleasant and cooperative. Denies any SI, HI, AVH. Appropriate with staff and peers. Prn given for anxiety. Was looking for 6hour dose of Ativan. Order discontinued, patient verbalized understanding.  Patient noted in dayroom socializing with peers. In no distress. No s/s of withdrawal. Encouragement and support provided. Safety checks maintained. Medications given as prescribed. Pt receptive and remains safe on unit with q 15 min checks.

## 2021-02-08 NOTE — Plan of Care (Signed)
  Problem: Education: Goal: Knowledge of [facility] Education information/materials will improve Outcome: Progressing Goal: Emotional status will improve Outcome: Progressing Goal: Mental status will improve Outcome: Progressing Goal: Verbalization of understanding the information provided will improve Outcome: Progressing   Problem: Activity: Goal: Interest or engagement in activities will improve Outcome: Progressing   Problem: Coping: Goal: Ability to verbalize frustrations and anger appropriately will improve Outcome: Progressing Goal: Ability to demonstrate self-control will improve Outcome: Progressing   Problem: Safety: Goal: Periods of time without injury will increase Outcome: Progressing

## 2021-02-08 NOTE — Progress Notes (Signed)
Patient states he became very anxious in group because they were discussing anger management, and he feels this is an area he needs to work on. The anxiety made him nauseous. Hydroxyzine was given.

## 2021-02-08 NOTE — Progress Notes (Addendum)
Patient came up to this writer stating, "I'm not doing well today. I could not sleep all night and I had sweats". He is upset about his Ativan being discontinued. After explanation from the doctor, he does not bring up getting Ativan again. Pt mood is labile. At first, he was excited to be going home. When told he would have to wait for his paperwork tomorrow morning, he is upset and raises his voice. However, he calms down a few minutes later and is pleasant and cooperative. Patient is interacting with peers in the dayroom and is present in the milieu. He denies SI, HI, and AVH.  Medications were given per MD orders. Patient remains safe on the unit at this time and q15 minute safety checks are maintained.

## 2021-02-09 MED ORDER — TRAZODONE HCL 100 MG PO TABS: 100.0000 mg | ORAL_TABLET | Freq: Every evening | ORAL | 1 refills | Status: DC | PRN

## 2021-02-09 NOTE — Progress Notes (Signed)
Recreation Therapy Notes   Date: 02/09/2021  Time: 9:30 am   Location: Craft room   Behavioral response: Appropriate  Intervention Topic: Time management   Discussion/Intervention:  Group content today was focused on time management. The group defined time management and identified healthy ways to manage time. Individuals expressed how much of the 24 hours they use in a day. Patients expressed how much time they use just for themselves personally. The group expressed how they have managed their time in the past. Individuals participated in the intervention "Managing Life" where they had a chance to see how much of the 24 hours they use and where it goes. Clinical Observations/Feedback: Patient came to group and defined time management as managing time. He stated that he normally doesn't manages his time well, he drinks a lot and looses time. Participant expressed that time management is important to get things done. Individual was social with staff while participating in the intervention. Kween Bacorn LRT/CTRS         Maliya Marich 02/09/2021 1:10 PM

## 2021-02-09 NOTE — Progress Notes (Signed)
  [facility] Adult Case Management Discharge Plan :  Will you be returning to the same living situation after discharge:  No. At discharge, do you have transportation home?: Yes,  pt mother to provide transportation home. Do you have the ability to pay for your medications: No.  Release of information consent forms completed and in the chart;  Patient's signature needed at discharge.  Patient to Follow up at:  Follow-up Information    [facility] Follow up.   Why: Your appointment is tomorrow, 02/10/21 at 12noon with Lorella Nimrod via Zoom. Thanks! Contact information: [address] [place] [address] [phone]               Next level of care provider has access to [facility] Link:no  Safety Planning and Suicide Prevention discussed: Yes,  SPE completed with Arleta Creek, mother.  Have you used any form of tobacco in the last 30 days? (Cigarettes, Smokeless Tobacco, Cigars, and/or Pipes): Yes  Has patient been referred to the Quitline?: Patient refused referral  Patient has been referred for addiction treatment: Pt. refused referral  Glenis Smoker, LCSW 02/09/2021, 9:18 AM

## 2021-02-09 NOTE — Discharge Summary (Signed)
Physician Discharge Summary Note  Patient:  Nathan [name] is an 35 y.o., male MRN:  [id number] DOB:  [birth year] Patient phone:  [phone] (home)  Patient address:   [address] [place] [address],  Total Time spent with patient: 35 minutes- 25 minutes face-to-face contact with patient, 10 minutes documentation, coordination of care, scripts     Date of Admission:  02/05/2021 Date of Discharge: 02/09/2021  Reason for Admission:  35 year old male presenting for suicidal ideations and alcohol withdrawals.   Principal Problem: Bipolar 1 disorder, mixed, severe (HCC) Discharge Diagnoses: Principal Problem:   Bipolar 1 disorder, mixed, severe (HCC) Active Problems:   Tobacco use disorder   Alcohol use disorder, severe, dependence (HCC)   Cannabis use disorder, moderate, in controlled environment [facility])   Past Psychiatric History: Patient has multiple presentations to the medical room as well as prior hospitlizations. He was been diagnosed with major depressive disorder and bipolar disorder in the past. Per patient he states he was diagnosed with intermittent explosive disorder and schizophrenia in the past. He has been tried on Geodon, Prozac, depakote, Vistaril, Seroquel, and Zoloft he felt made his symptoms worse. He did note that Thorazine seemed to be somewhat helpful, but was too sedating during the day. He has a history of multiple suicide attempts.   Past Medical History:  Past Medical History:  Diagnosis Date  . Anxiety   . Bipolar 1 disorder (HCC)   . Chronic headache   . Depression 03/30/2020  . ETOH abuse 03/30/2020  . 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 Horseshoe kidney   . Marijuana smoker 03/30/2020    Past Surgical History:  Procedure Laterality Date  . TONSILLECTOMY     Family History:  Family History  Problem Relation Age of Onset  . Hypertension Mother   . Diabetes Mother   . Hypertension Father   . Heart failure Father   . Diabetes Father     Family Psychiatric  History: Notes that his father has schizophrenia, and his mother has multiple suicide attempts, "everyone" with substance use Social History:  Social History   Substance and Sexual Activity  Alcohol Use Yes  . Alcohol/week: 24.0 standard drinks  . Types: 24 Cans of beer per week   Comment: 4 gallons a day     Social History   Substance and Sexual Activity  Drug Use Yes  . Types: Marijuana   Comment: 2-3 joints weekly    Social History   Socioeconomic History  . Marital status: Single    Spouse name: Not on file  . Number of children: 0  . Years of education: 49  . Highest education level: Not on file  Occupational History  . Not on file  Tobacco Use  . Smoking status: Current Every Day Smoker    Packs/day: 1.00    Types: Cigarettes  . Smokeless tobacco: Current User    Types: Chew  Vaping Use  . Vaping Use: Never used  Substance and Sexual Activity  . Alcohol use: Yes    Alcohol/week: 24.0 standard drinks    Types: 24 Cans of beer per week    Comment: 4 gallons a day  . Drug use: Yes    Types: Marijuana    Comment: 2-3 joints weekly  . Sexual activity: Not Currently  Other Topics Concern  . Not on file  Social History Narrative  . Not on file   Social Determinants of Health   Financial Resource Strain: Not  on file  Food Insecurity: Not on file  Transportation Needs: Not on file  Physical Activity: Not on file  Stress: Not on file  Social Connections: Not on file    Hospital Course:  35 year old male presenting for suicidal ideations and alcohol withdrawals. He completed a 3-day librium taper, and also required additional Ativan per CIWA protocol. However, acute alcohol detox completed without any complications. He was started on Latuda 20 with supper for mood, however requested to discontinue after one dose as he felt this made his anger worse. He declined to try an alternate medication during this hospital stay aside from Trazodone  PRN for insomnia. He participated in groups, and interacted well with peers and staff. At time of discharge he denied suicidal ideations, homicidal ideations, visual hallucinations, and auditory hallucinations. He planned to stay with his mother and follow-up with [facility] for substance abuse treatment.   Physical Findings: AIMS:  , ,  ,  ,    CIWA:  CIWA-Ar Total: 2 COWS:     Musculoskeletal: Strength & Muscle Tone: within normal limits Gait & Station: normal Patient leans: N/A  Psychiatric Specialty Exam: Physical Exam Vitals and nursing note reviewed.  Constitutional:      Appearance: Normal appearance.  HENT:     Head: Normocephalic and atraumatic.     Right Ear: External ear normal.     Left Ear: External ear normal.     Nose: Nose normal.     Mouth/Throat:     Mouth: Mucous membranes are moist.     Pharynx: Oropharynx is clear.  Eyes:     Extraocular Movements: Extraocular movements intact.     Conjunctiva/sclera: Conjunctivae normal.     Pupils: Pupils are equal, round, and reactive to light.  Cardiovascular:     Rate and Rhythm: Normal rate.     Pulses: Normal pulses.  Pulmonary:     Effort: Pulmonary effort is normal.     Breath sounds: Normal breath sounds.  Abdominal:     General: Abdomen is flat.     Palpations: Abdomen is soft.  Musculoskeletal:        General: No swelling. Normal range of motion.     Cervical back: Normal range of motion and neck supple.  Skin:    General: Skin is warm and dry.     Comments: Multiple tattoos visible on face, neck, chest, and bilateral arms  Neurological:     General: No focal deficit present.     Mental Status: He is alert and oriented to person, place, and time.  Psychiatric:        Mood and Affect: Mood normal.        Behavior: Behavior normal.        Thought Content: Thought content normal.        Judgment: Judgment normal.     Review of Systems  Constitutional: Negative for appetite change and fatigue.  HENT:  Negative for rhinorrhea and sore throat.   Eyes: Negative for photophobia and visual disturbance.  Respiratory: Negative for cough and shortness of breath.   Cardiovascular: Negative for chest pain and palpitations.  Gastrointestinal: Negative for constipation, diarrhea, nausea and vomiting.  Endocrine: Negative for cold intolerance and heat intolerance.  Genitourinary: Negative for difficulty urinating and dysuria.  Musculoskeletal: Negative for arthralgias and myalgias.  Skin: Negative for rash and wound.  Allergic/Immunologic: Positive for environmental allergies and food allergies.  Neurological: Negative for dizziness and headaches.  Hematological: Negative for adenopathy. Does not bruise/bleed  easily.  Psychiatric/Behavioral: Negative for agitation, behavioral problems, hallucinations and suicidal ideas.    Blood pressure 124/78, pulse 68, temperature 98.1 F (36.7 C), temperature source Oral, resp. rate 18, height 5\' 9"  (1.753 m), weight 59 kg, SpO2 100 %.Body mass index is 19.2 kg/m.  General Appearance: Casual  Eye Contact::  Good  Speech:  Clear and Coherent and Normal Rate  Volume:  Normal  Mood:  Euthymic  Affect:  Congruent  Thought Process:  Coherent and Linear  Orientation:  Full (Time, Place, and Person)  Thought Content:  Logical  Suicidal Thoughts:  No  Homicidal Thoughts:  No  Memory:  Immediate;   Fair Recent;   Fair Remote;   Fair  Judgement:  Intact  Insight:  Shallow  Psychomotor Activity:  Normal  Concentration:  Fair  Recall:  Fair  Fund of Knowledge:Fair  Language: Fair  Akathisia:  Negative  Handed:  Right  AIMS (if indicated):     Assets:  Communication Skills Desire for Improvement Physical Health Resilience Social Support  Sleep:  Number of Hours: 5  Cognition: WNL  ADL's:  Intact        Have you used any form of tobacco in the last 30 days? (Cigarettes, Smokeless Tobacco, Cigars, and/or Pipes): Yes  Has this patient used any form  of tobacco in the last 30 days? (Cigarettes, Smokeless Tobacco, Cigars, and/or Pipes)  Yes, A prescription for an FDA-approved tobacco cessation medication was offered at discharge and the patient refused  Blood Alcohol level:  Lab Results  Component Value Date   [facility]   ETH <10 08/27/2020    Metabolic Disorder Labs:  Lab Results  Component Value Date   HGBA1C 5.2 02/06/2021   MPG 103 02/06/2021   Lab Results  Component Value Date   PROLACTIN 7.0 04/23/2016   Lab Results  Component Value Date   CHOL 139 02/06/2021   TRIG 55 02/06/2021   HDL 55 02/06/2021   CHOLHDL 2.5 02/06/2021   VLDL 11 02/06/2021   LDLCALC 73 02/06/2021   LDLCALC 105 (H) 04/23/2016    See Psychiatric Specialty Exam and Suicide Risk Assessment completed by Attending Physician prior to discharge.  Discharge destination:  Home  Is patient on multiple antipsychotic therapies at discharge:  No   Has Patient had three or more failed trials of antipsychotic monotherapy by history:  No  Recommended Plan for Multiple Antipsychotic Therapies: NA  Discharge Instructions    Diet general   Complete by: As directed    Increase activity slowly   Complete by: As directed      Allergies as of 02/09/2021      Reactions   Geodon [ziprasidone Hcl] Other (See Comments)   Seizures   Onion Anaphylaxis   Valproic Acid    Levofloxacin Itching      Medication List    STOP taking these medications   hydrOXYzine 25 MG tablet Commonly known as: ATARAX/VISTARIL   hydrOXYzine 50 MG tablet Commonly known as: ATARAX/VISTARIL   nicotine polacrilex 2 MG gum Commonly known as: NICORETTE   pantoprazole 40 MG tablet Commonly known as: PROTONIX   risperiDONE 3 MG tablet Commonly known as: RISPERDAL     TAKE these medications     Indication  traZODone 100 MG tablet Commonly known as: DESYREL Take 1 tablet (100 mg total) by mouth at bedtime as needed for sleep.  Indication: Trouble Sleeping         Follow-up recommendations:  Activity:  as  tolerated Diet:  regular diet  Comments:  30-day script with 1 refill for Trazodone sent to [facility] in [place], [place] per patient request  Signed: Jesse Sans, MD 02/09/2021, 9:14 AM

## 2021-02-09 NOTE — Plan of Care (Signed)
  Problem: Education: Goal: Knowledge of [facility] Education information/materials will improve Outcome: Adequate for Discharge Goal: Emotional status will improve Outcome: Adequate for Discharge Goal: Mental status will improve Outcome: Adequate for Discharge Goal: Verbalization of understanding the information provided will improve Outcome: Adequate for Discharge   Problem: Activity: Goal: Interest or engagement in activities will improve Outcome: Adequate for Discharge Goal: Sleeping patterns will improve Outcome: Adequate for Discharge   Problem: Coping: Goal: Ability to verbalize frustrations and anger appropriately will improve Outcome: Adequate for Discharge Goal: Ability to demonstrate self-control will improve Outcome: Adequate for Discharge   Problem: Health Behavior/Discharge Planning: Goal: Identification of resources available to assist in meeting health care needs will improve Outcome: Adequate for Discharge Goal: Compliance with treatment plan for underlying cause of condition will improve Outcome: Adequate for Discharge   Problem: Physical Regulation: Goal: Ability to maintain clinical measurements within normal limits will improve Outcome: Adequate for Discharge   Problem: Safety: Goal: Periods of time without injury will increase Outcome: Adequate for Discharge   Problem: Education: Goal: Ability to state activities that reduce stress will improve Outcome: Adequate for Discharge   Problem: Coping: Goal: Ability to identify and develop effective coping behavior will improve Outcome: Adequate for Discharge   Problem: Self-Concept: Goal: Ability to identify factors that promote anxiety will improve Outcome: Adequate for Discharge Goal: Level of anxiety will decrease Outcome: Adequate for Discharge Goal: Ability to modify response to factors that promote anxiety will improve Outcome: Adequate for Discharge   Problem: Education: Goal:  Utilization of techniques to improve thought processes will improve Outcome: Adequate for Discharge Goal: Knowledge of the prescribed therapeutic regimen will improve Outcome: Adequate for Discharge   Problem: Activity: Goal: Interest or engagement in leisure activities will improve Outcome: Adequate for Discharge Goal: Imbalance in normal sleep/wake cycle will improve Outcome: Adequate for Discharge   Problem: Coping: Goal: Coping ability will improve Outcome: Adequate for Discharge Goal: Will verbalize feelings Outcome: Adequate for Discharge   Problem: Health Behavior/Discharge Planning: Goal: Ability to make decisions will improve Outcome: Adequate for Discharge Goal: Compliance with therapeutic regimen will improve Outcome: Adequate for Discharge   Problem: Role Relationship: Goal: Will demonstrate positive changes in social behaviors and relationships Outcome: Adequate for Discharge   Problem: Safety: Goal: Ability to disclose and discuss suicidal ideas will improve Outcome: Adequate for Discharge Goal: Ability to identify and utilize support systems that promote safety will improve Outcome: Adequate for Discharge   Problem: Self-Concept: Goal: Will verbalize positive feelings about self Outcome: Adequate for Discharge Goal: Level of anxiety will decrease Outcome: Adequate for Discharge

## 2021-02-09 NOTE — Progress Notes (Signed)
Patient has been pleasant and cooperative. Denies SI, HI and AVH 

## 2021-02-09 NOTE — Progress Notes (Signed)
Pt was educated on prescriptions and follow up care. Pt questions were answered and pt verbalized understanding and did not voice any concerns. Pt's belongings were returned. [facility] ° °

## 2021-02-09 NOTE — Progress Notes (Signed)
Patient is in a pleasant mood this morning. He expresses he is excited to be leaving, but is a little nervous. Patient denies SI, HI, and AVH. He is not in any pain and does not express any physical concerns. Patient remains safe on the unit at this time and q15 minute safety checks are maintained.

## 2021-02-09 NOTE — BHH Suicide Risk Assessment (Signed)
[facility] Discharge Suicide Risk Assessment   Principal Problem: Bipolar 1 disorder, mixed, severe (HCC) Discharge Diagnoses: Principal Problem:   Bipolar 1 disorder, mixed, severe (HCC) Active Problems:   Tobacco use disorder   Alcohol use disorder, severe, dependence (HCC)   Cannabis use disorder, moderate, in controlled environment (HCC)   Total Time spent with patient: 35 minutes- 25 minutes face-to-face contact with patient, 10 minutes documentation, coordination of care, scripts   Musculoskeletal: Strength & Muscle Tone: within normal limits Gait & Station: normal Patient leans: N/A  Psychiatric Specialty Exam: Review of Systems  Constitutional: Negative for appetite change and fatigue.  HENT: Negative for rhinorrhea and sore throat.   Eyes: Negative for photophobia and visual disturbance.  Respiratory: Negative for cough and shortness of breath.   Cardiovascular: Negative for chest pain and palpitations.  Gastrointestinal: Negative for constipation, diarrhea, nausea and vomiting.  Endocrine: Negative for cold intolerance and heat intolerance.  Genitourinary: Negative for difficulty urinating and dysuria.  Musculoskeletal: Negative for arthralgias and myalgias.  Skin: Negative for rash and wound.  Allergic/Immunologic: Positive for environmental allergies and food allergies.  Neurological: Negative for dizziness and headaches.  Hematological: Negative for adenopathy. Does not bruise/bleed easily.  Psychiatric/Behavioral: Negative for agitation, behavioral problems, hallucinations and suicidal ideas.    Blood pressure 124/78, pulse 68, temperature 98.1 F (36.7 C), temperature source Oral, resp. rate 18, height 5\' 9"  (1.753 m), weight 59 kg, SpO2 100 %.Body mass index is 19.2 kg/m.  General Appearance: Casual  Eye Contact::  Good  Speech:  Clear and Coherent and Normal Rate  Volume:  Normal  Mood:  Euthymic  Affect:  Congruent  Thought Process:  Coherent and Linear   Orientation:  Full (Time, Place, and Person)  Thought Content:  Logical  Suicidal Thoughts:  No  Homicidal Thoughts:  No  Memory:  Immediate;   Fair Recent;   Fair Remote;   Fair  Judgement:  Intact  Insight:  Shallow  Psychomotor Activity:  Normal  Concentration:  Fair  Recall:  [phone] of Knowledge:Fair  Language: Fair  Akathisia:  Negative  Handed:  Right  AIMS (if indicated):     Assets:  Communication Skills Desire for Improvement Physical Health Resilience Social Support  Sleep:  Number of Hours: 5  Cognition: WNL  ADL's:  Intact   Mental Status Per Nursing Assessment::   On Admission:  NA  Demographic Factors:  Male and Caucasian  Loss Factors: NA  Historical Factors: Prior suicide attempts and Impulsivity  Risk Reduction Factors:   Responsible for children under 5 years of age, Sense of responsibility to family, Living with another person, especially a relative, Positive social support, Positive therapeutic relationship and Positive coping skills or problem solving skills  Continued Clinical Symptoms:  Bipolar Disorder:   Depressive phase Alcohol/Substance Abuse/Dependencies Previous Psychiatric Diagnoses and Treatments  Cognitive Features That Contribute To Risk:  None    Suicide Risk:  Mild:  Suicidal ideation of limited frequency, intensity, duration, and specificity.  There are no identifiable plans, no associated intent, mild dysphoria and related symptoms, good self-control (both objective and subjective assessment), few other risk factors, and identifiable protective factors, including available and accessible social support.    Plan Of Care/Follow-up recommendations:  Activity:  as tolerated Diet:  regular diet  15, MD 02/09/2021, 9:04 AM

## 2021-02-20 ENCOUNTER — Other Ambulatory Visit: Payer: Self-pay

## 2021-02-20 ENCOUNTER — Emergency Department (HOSPITAL_COMMUNITY)
Admission: EM | Admit: 2021-02-20 | Discharge: 2021-02-20 | Disposition: A | Discharge status: Home / Self Care | Payer: Self-pay | Attending: Emergency Medicine | Admitting: Emergency Medicine

## 2021-02-20 ENCOUNTER — Encounter (HOSPITAL_COMMUNITY): Payer: Self-pay | Admitting: Emergency Medicine

## 2021-02-20 ENCOUNTER — Emergency Department (HOSPITAL_COMMUNITY): Payer: Self-pay

## 2021-02-20 DIAGNOSIS — R000 Tachycardia, unspecified: Secondary | ICD-10-CM | POA: Insufficient documentation

## 2021-02-20 DIAGNOSIS — R0789 Other chest pain: Secondary | ICD-10-CM | POA: Insufficient documentation

## 2021-02-20 DIAGNOSIS — F17210 Nicotine dependence, cigarettes, uncomplicated: Secondary | ICD-10-CM | POA: Insufficient documentation

## 2021-02-20 HISTORY — DX: Opioid dependence, uncomplicated: F11.20

## 2021-02-20 LAB — CBC
HCT: 44.3 % (ref 39.0–52.0)
Hemoglobin: 15.8 g/dL (ref 13.0–17.0)
MCH: 33.4 pg (ref 26.0–34.0)
MCHC: 35.7 g/dL (ref 30.0–36.0)
MCV: 93.7 fL (ref 80.0–100.0)
Platelets: 320 10*3/uL (ref 150–400)
RBC: 4.73 MIL/uL (ref 4.22–5.81)
RDW: 15.4 % (ref 11.5–15.5)
WBC: 7.9 10*3/uL (ref 4.0–10.5)
nRBC: 0 % (ref 0.0–0.2)

## 2021-02-20 LAB — BASIC METABOLIC PANEL
Anion gap: 14 (ref 5–15)
BUN: 7 mg/dL (ref 6–20)
CO2: 24 mmol/L (ref 22–32)
Calcium: 9.2 mg/dL (ref 8.9–10.3)
Chloride: 97 mmol/L — ABNORMAL LOW (ref 98–111)
Creatinine, Ser: 0.59 mg/dL — ABNORMAL LOW (ref 0.61–1.24)
GFR, Estimated: >60 mL/min (ref >60)
Glucose, Bld: 93 mg/dL (ref 70–99)
Potassium: 3.7 mmol/L (ref 3.5–5.1)
Sodium: 135 mmol/L (ref 135–145)

## 2021-02-20 LAB — TROPONIN I (HIGH SENSITIVITY)
Troponin I (High Sensitivity): 14 ng/L (ref <18)
Troponin I (High Sensitivity): 13 ng/L (ref <18)

## 2021-02-20 MED ORDER — NALOXONE HCL 4 MG/0.1ML NA LIQD
1.0000 | Freq: Once | NASAL | Status: AC
  Administered 2021-02-20: 1 via NASAL
  Filled 2021-02-20: qty 4

## 2021-02-20 MED ORDER — SODIUM CHLORIDE 0.9 % IV BOLUS
1000.0000 mL | Freq: Once | INTRAVENOUS | Status: AC
  Administered 2021-02-20: 1000 mL via INTRAVENOUS

## 2021-02-20 MED ORDER — KETOROLAC TROMETHAMINE 15 MG/ML IJ SOLN
15.0000 mg | Freq: Once | INTRAMUSCULAR | Status: AC
  Administered 2021-02-20: 15 mg via INTRAVENOUS
  Filled 2021-02-20: qty 1

## 2021-02-20 NOTE — ED Triage Notes (Signed)
Patient reports central chest pain this morning , denies SOB , no emesis or diaphoresis , patient added Heroin use last night .

## 2021-02-20 NOTE — ED Provider Notes (Signed)
[name] EMERGENCY DEPARTMENT Provider Note   CSN: [id number] Arrival date & time: 02/20/21  0453     History Chief Complaint  Patient presents with  . Chest Pain    Nathan [name] is a 35 y.o. male.  HPI 35 year old male presents with a chief complaint of chest pain. He states that he accidentally overdosed on heroin sometime in the middle of the night. His girlfriend both did CPR and gave him Narcan. He regained mental status and declined EMS intervention at that time. He then went and took a nap. At some point early this morning he woke up with sharp chest pain in the middle of his chest. It is constant and nothing makes it better or worse. No shortness of breath. No recent illness or current illness. He did not try to hurt himself with this overdose.   Past Medical History:  Diagnosis Date  . Anxiety   . Bipolar 1 disorder (HCC)   . Chronic headache   . Depression 03/30/2020  . ETOH abuse 03/30/2020  . 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 Heroin addiction (HCC)   . Horseshoe kidney   . Marijuana smoker 03/30/2020    Patient Active Problem List   Diagnosis Date Noted  . Severe recurrent major depression without psychotic features (HCC) 02/05/2021  . Abdominal pain   . Moderate alcohol use disorder (HCC) 08/29/2020  . Intentional overdose of drug in tablet form (HCC) 08/28/2020  . Schizoaffective disorder (HCC) 07/26/2020  . 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 Chronic headache 06/26/2020  . Encounter to establish care 06/26/2020  . History of gastroesophageal reflux (GERD) 06/26/2020  . Cannabis use disorder, moderate, in controlled environment (HCC) 04/10/2020  . Cocaine use disorder (HCC) 04/10/2020  . Alcohol use disorder, severe, dependence (HCC) 04/09/2020  . Stress reaction, acute 04/09/2020  . Non-suicidal self harm 04/05/2020  . Bipolar 1 disorder, depressed (HCC) 01/31/2018  . Intercostal pain 09/07/2016  . Bipolar 1 disorder,  mixed, severe (HCC) 04/22/2016  . Alcohol abuse 04/22/2016  . Suicidal ideation 04/22/2016  . Tobacco use disorder 04/22/2016  . 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 Cocaine abuse in remission (HCC) 04/26/2012    Past Surgical History:  Procedure Laterality Date  . TONSILLECTOMY         Family History  Problem Relation Age of Onset  . Hypertension Mother   . Diabetes Mother   . Hypertension Father   . Heart failure Father   . Diabetes Father     Social History   Tobacco Use  . Smoking status: Current Every Day Smoker    Packs/day: 1.00    Types: Cigarettes  . Smokeless tobacco: Current User    Types: Chew  Vaping Use  . Vaping Use: Never used  Substance Use Topics  . Alcohol use: Yes    Alcohol/week: 24.0 standard drinks    Types: 24 Cans of beer per week    Comment: 4 gallons a day  . Drug use: Yes    Types: Marijuana    Comment: Heroin addiction     Home Medications Prior to Admission medications   Medication Sig Start Date End Date Taking? Authorizing Provider  traZODone (DESYREL) 100 MG tablet Take 1 tablet (100 mg total) by mouth at bedtime as needed for sleep. 02/09/21   Jesse Sans, MD    Allergies    Geodon [ziprasidone hcl], Onion, Valproic acid, and Levofloxacin  Review of  Systems   Review of Systems  Constitutional: Negative for fever.  Respiratory: Negative for cough and shortness of breath.   Cardiovascular: Positive for chest pain.  All other systems reviewed and are negative.   Physical Exam Updated Vital Signs BP (!) 147/111   Pulse 95   Temp 98 F (36.7 C) (Oral)   Resp (!) 25   Ht 5\' 9"  (1.753 m)   Wt 75 kg   SpO2 98%   BMI 24.42 kg/m   Physical Exam Vitals and nursing note reviewed.  Constitutional:      Appearance: He is well-developed and well-nourished.  HENT:     Head: Normocephalic and atraumatic.     Right Ear: External ear normal.     Left Ear: External ear normal.     Nose:  Nose normal.  Eyes:     General:        Right eye: No discharge.        Left eye: No discharge.  Cardiovascular:     Rate and Rhythm: Regular rhythm. Tachycardia present.     Heart sounds: Normal heart sounds. No murmur heard.     Comments: HR~100 Pulmonary:     Effort: Pulmonary effort is normal.     Breath sounds: Normal breath sounds.  Chest:     Chest wall: Tenderness (over inferior sternum) present.  Abdominal:     Palpations: Abdomen is soft.     Tenderness: There is no abdominal tenderness.  Musculoskeletal:        General: No edema.     Cervical back: Neck supple.  Skin:    General: Skin is warm and dry.  Neurological:     Mental Status: He is alert.  Psychiatric:        Mood and Affect: Mood is not anxious.     ED Results / Procedures / Treatments   Labs (all labs ordered are listed, but only abnormal results are displayed) Labs Reviewed  BASIC METABOLIC PANEL - Abnormal; Notable for the following components:      Result Value   Chloride 97 (*)    Creatinine, Ser 0.59 (*)    All other components within normal limits  CBC  TROPONIN I (HIGH SENSITIVITY)  TROPONIN I (HIGH SENSITIVITY)    EKG EKG Interpretation  Date/Time:  Friday February 20 2021 04:57:09 EST Ventricular Rate:  104 PR Interval:  144 QRS Duration: 88 QT Interval:  350 QTC Calculation: 460 R Axis:   78 Text Interpretation: Sinus tachycardia no acute ST/T changes similar to Feb 2022 Confirmed by Mar 2022 [phone]) on 02/20/2021 6:57:25 AM   Radiology DG Chest 2 View  Result Date: 02/20/2021 CLINICAL DATA:  Chest pain EXAM: CHEST - 2 VIEW COMPARISON:  07/17/2019 FINDINGS: Cardiac shadows within normal limits. The lungs are mildly hyperinflated without focal infiltrate or sizable effusion. No acute bony abnormality is noted. IMPRESSION: No acute abnormality seen. Electronically Signed   By: 07/19/2019 M.D.   On: 02/20/2021 05:25    Procedures Procedures   Medications Ordered in  ED Medications  ketorolac (TORADOL) 15 MG/ML injection 15 mg (15 mg Intravenous Given 02/20/21 0720)  sodium chloride 0.9 % bolus 1,000 mL (1,000 mLs Intravenous New Bag/Given 02/20/21 0720)    ED Course  I have reviewed the triage vital signs and the nursing notes.  Pertinent labs & imaging results that were available during my care of the patient were reviewed by me and considered in my medical decision making (see chart  for details).    MDM Rules/Calculators/A&P                          Patient's history and presentation are consistent with chest wall pain from the brief CPR given by significant other.  His troponins are negative x2.  Was initial ECG was a little tachycardic, there are no ischemic changes.  However given the situation, this seems highly unlikely that he had a PE.  I do not think further testing/work-up is needed.  He is feeling better after some Toradol.  Appears stable for discharge home with return precautions. Final Clinical Impression(s) / ED Diagnoses Final diagnoses:  Chest wall pain    Rx / DC Orders ED Discharge Orders    None       Pricilla Loveless, MD 02/20/21 [phone]

## 2021-02-20 NOTE — Discharge Instructions (Signed)
If you develop recurrent, continued, or worsening chest pain, shortness of breath, fever, vomiting, abdominal or back pain, or any other new/concerning symptoms then return to the ER for evaluation.  

## 2021-03-24 ENCOUNTER — Other Ambulatory Visit: Payer: Self-pay

## 2021-03-24 ENCOUNTER — Emergency Department
Admission: EM | Admit: 2021-03-24 | Discharge: 2021-03-25 | Disposition: A | Discharge status: Other Acute Inpatient Hospital | Payer: Self-pay | Attending: Emergency Medicine | Admitting: Emergency Medicine

## 2021-03-24 DIAGNOSIS — F1010 Alcohol abuse, uncomplicated: Secondary | ICD-10-CM

## 2021-03-24 DIAGNOSIS — F319 Bipolar disorder, unspecified: Secondary | ICD-10-CM | POA: Diagnosis present

## 2021-03-24 DIAGNOSIS — F332 Major depressive disorder, recurrent severe without psychotic features: Secondary | ICD-10-CM | POA: Insufficient documentation

## 2021-03-24 DIAGNOSIS — F1420 Cocaine dependence, uncomplicated: Secondary | ICD-10-CM | POA: Insufficient documentation

## 2021-03-24 DIAGNOSIS — F1290 Cannabis use, unspecified, uncomplicated: Secondary | ICD-10-CM | POA: Insufficient documentation

## 2021-03-24 DIAGNOSIS — F10939 Alcohol use, unspecified with withdrawal, unspecified: Secondary | ICD-10-CM

## 2021-03-24 DIAGNOSIS — Z20822 Contact with and (suspected) exposure to covid-19: Secondary | ICD-10-CM | POA: Insufficient documentation

## 2021-03-24 DIAGNOSIS — F1020 Alcohol dependence, uncomplicated: Secondary | ICD-10-CM | POA: Insufficient documentation

## 2021-03-24 DIAGNOSIS — F17210 Nicotine dependence, cigarettes, uncomplicated: Secondary | ICD-10-CM | POA: Insufficient documentation

## 2021-03-24 DIAGNOSIS — F10239 Alcohol dependence with withdrawal, unspecified: Secondary | ICD-10-CM

## 2021-03-24 DIAGNOSIS — F1410 Cocaine abuse, uncomplicated: Secondary | ICD-10-CM

## 2021-03-24 DIAGNOSIS — R45851 Suicidal ideations: Secondary | ICD-10-CM | POA: Insufficient documentation

## 2021-03-24 LAB — COMPREHENSIVE METABOLIC PANEL
ALT: 56 U/L — ABNORMAL HIGH (ref 0–44)
AST: 73 U/L — ABNORMAL HIGH (ref 15–41)
Albumin: 4.6 g/dL (ref 3.5–5.0)
Alkaline Phosphatase: 92 U/L (ref 38–126)
Anion gap: 15 (ref 5–15)
BUN: 7 mg/dL (ref 6–20)
CO2: 22 mmol/L (ref 22–32)
Calcium: 9.3 mg/dL (ref 8.9–10.3)
Chloride: 94 mmol/L — ABNORMAL LOW (ref 98–111)
Creatinine, Ser: 0.53 mg/dL — ABNORMAL LOW (ref 0.61–1.24)
GFR, Estimated: >60 mL/min (ref >60)
Glucose, Bld: 162 mg/dL — ABNORMAL HIGH (ref 70–99)
Potassium: 3.3 mmol/L — ABNORMAL LOW (ref 3.5–5.1)
Sodium: 131 mmol/L — ABNORMAL LOW (ref 135–145)
Total Bilirubin: 1.2 mg/dL (ref 0.3–1.2)
Total Protein: 8.2 g/dL — ABNORMAL HIGH (ref 6.5–8.1)

## 2021-03-24 LAB — CBC
HCT: 45.9 % (ref 39.0–52.0)
Hemoglobin: 16.3 g/dL (ref 13.0–17.0)
MCH: 33.3 pg (ref 26.0–34.0)
MCHC: 35.5 g/dL (ref 30.0–36.0)
MCV: 93.9 fL (ref 80.0–100.0)
Platelets: 246 10*3/uL (ref 150–400)
RBC: 4.89 MIL/uL (ref 4.22–5.81)
RDW: 14.5 % (ref 11.5–15.5)
WBC: 11.9 10*3/uL — ABNORMAL HIGH (ref 4.0–10.5)
nRBC: 0 % (ref 0.0–0.2)

## 2021-03-24 LAB — URINE DRUG SCREEN, QUALITATIVE (ARMC ONLY)
Amphetamines, Ur Screen: NOT DETECTED
Barbiturates, Ur Screen: NOT DETECTED
Benzodiazepine, Ur Scrn: NOT DETECTED
Cannabinoid 50 Ng, Ur ~~LOC~~: POSITIVE — AB
Cocaine Metabolite,Ur ~~LOC~~: POSITIVE — AB
MDMA (Ecstasy)Ur Screen: NOT DETECTED
Methadone Scn, Ur: NOT DETECTED
Opiate, Ur Screen: NOT DETECTED
Phencyclidine (PCP) Ur S: NOT DETECTED
Tricyclic, Ur Screen: NOT DETECTED

## 2021-03-24 LAB — ETHANOL: Alcohol, Ethyl (B): 221 mg/dL — ABNORMAL HIGH (ref <10)

## 2021-03-24 MED ORDER — IBUPROFEN 600 MG PO TABS
600.0000 mg | ORAL_TABLET | Freq: Once | ORAL | Status: AC
  Administered 2021-03-25: 600 mg via ORAL
  Filled 2021-03-24: qty 1

## 2021-03-24 MED ORDER — CHLORDIAZEPOXIDE HCL 25 MG PO CAPS
50.0000 mg | ORAL_CAPSULE | Freq: Three times a day (TID) | ORAL | Status: DC
  Administered 2021-03-25 (×3): 50 mg via ORAL
  Filled 2021-03-24 (×3): qty 2

## 2021-03-24 MED ORDER — ONDANSETRON 4 MG PO TBDP
4.0000 mg | ORAL_TABLET | Freq: Once | ORAL | Status: AC
  Administered 2021-03-25: 4 mg via ORAL
  Filled 2021-03-24: qty 1

## 2021-03-24 NOTE — ED Notes (Addendum)
Patient changed into paper scrubs at this time. Patient pants, shoes, socks, underwear, shirt, and belt placed in pt belonging bag. Ring on R hand unable to be removed. Two silver bracelet placed in bag.

## 2021-03-24 NOTE — ED Triage Notes (Signed)
Pt presents to the ED with a cc of suicidal ideation with a plan to shoot himself in the head with a gun tonight. Pt also reports heavy ETOH use daily. Pt tearful in triage and states that he didn't kill himself because of his 34 year old son. Not currently taking any medications.

## 2021-03-25 ENCOUNTER — Inpatient Hospital Stay
Admission: RE | Admit: 2021-03-25 | Discharge: 2021-03-30 | DRG: 885 | Disposition: A | Discharge status: Home / Self Care | Payer: 59 | Source: Intra-hospital | Attending: Behavioral Health | Admitting: Behavioral Health

## 2021-03-25 ENCOUNTER — Encounter: Payer: Self-pay | Admitting: Psychiatry

## 2021-03-25 ENCOUNTER — Inpatient Hospital Stay: Payer: 59

## 2021-03-25 DIAGNOSIS — Z20822 Contact with and (suspected) exposure to covid-19: Secondary | ICD-10-CM | POA: Diagnosis present

## 2021-03-25 DIAGNOSIS — T1490XA Injury, unspecified, initial encounter: Secondary | ICD-10-CM

## 2021-03-25 DIAGNOSIS — F319 Bipolar disorder, unspecified: Secondary | ICD-10-CM | POA: Diagnosis not present

## 2021-03-25 DIAGNOSIS — Z9151 Personal history of suicidal behavior: Secondary | ICD-10-CM | POA: Diagnosis not present

## 2021-03-25 DIAGNOSIS — Z79899 Other long term (current) drug therapy: Secondary | ICD-10-CM

## 2021-03-25 DIAGNOSIS — R45851 Suicidal ideations: Secondary | ICD-10-CM | POA: Diagnosis present

## 2021-03-25 DIAGNOSIS — F1020 Alcohol dependence, uncomplicated: Secondary | ICD-10-CM | POA: Diagnosis present

## 2021-03-25 DIAGNOSIS — F10239 Alcohol dependence with withdrawal, unspecified: Secondary | ICD-10-CM

## 2021-03-25 DIAGNOSIS — I252 Old myocardial infarction: Secondary | ICD-10-CM

## 2021-03-25 DIAGNOSIS — F17210 Nicotine dependence, cigarettes, uncomplicated: Secondary | ICD-10-CM | POA: Diagnosis present

## 2021-03-25 DIAGNOSIS — F3130 Bipolar disorder, current episode depressed, mild or moderate severity, unspecified: Principal | ICD-10-CM | POA: Diagnosis present

## 2021-03-25 DIAGNOSIS — Z818 Family history of other mental and behavioral disorders: Secondary | ICD-10-CM

## 2021-03-25 DIAGNOSIS — F1220 Cannabis dependence, uncomplicated: Secondary | ICD-10-CM | POA: Diagnosis present

## 2021-03-25 DIAGNOSIS — F17200 Nicotine dependence, unspecified, uncomplicated: Secondary | ICD-10-CM | POA: Diagnosis present

## 2021-03-25 DIAGNOSIS — F19980 Other psychoactive substance use, unspecified with psychoactive substance-induced anxiety disorder: Secondary | ICD-10-CM | POA: Diagnosis present

## 2021-03-25 DIAGNOSIS — F10939 Alcohol use, unspecified with withdrawal, unspecified: Secondary | ICD-10-CM

## 2021-03-25 DIAGNOSIS — M79672 Pain in left foot: Secondary | ICD-10-CM | POA: Diagnosis present

## 2021-03-25 LAB — RESP PANEL BY RT-PCR (FLU A&B, COVID) ARPGX2
Influenza A by PCR: NEGATIVE
Influenza B by PCR: NEGATIVE
SARS Coronavirus 2 by RT PCR: NEGATIVE

## 2021-03-25 MED ORDER — HYDROXYZINE HCL 50 MG PO TABS
50.0000 mg | ORAL_TABLET | Freq: Three times a day (TID) | ORAL | Status: DC | PRN
  Administered 2021-03-25: 50 mg via ORAL
  Filled 2021-03-25: qty 1

## 2021-03-25 MED ORDER — ALUM & MAG HYDROXIDE-SIMETH 200-200-20 MG/5ML PO SUSP: 30.0000 mL | ORAL | Status: DC | PRN

## 2021-03-25 MED ORDER — NICOTINE POLACRILEX 2 MG MT GUM
2.0000 mg | CHEWING_GUM | OROMUCOSAL | Status: DC | PRN
  Administered 2021-03-25 – 2021-03-30 (×11): 2 mg via ORAL
  Filled 2021-03-25 (×13): qty 1

## 2021-03-25 MED ORDER — TRAZODONE HCL 100 MG PO TABS
100.0000 mg | ORAL_TABLET | Freq: Every evening | ORAL | Status: DC | PRN
Start: 2021-03-25 — End: 2021-03-30
  Administered 2021-03-25 – 2021-03-29 (×4): 100 mg via ORAL
  Filled 2021-03-25 (×4): qty 1

## 2021-03-25 MED ORDER — MAGNESIUM HYDROXIDE 400 MG/5ML PO SUSP: 30.0000 mL | Freq: Every day | ORAL | Status: DC | PRN

## 2021-03-25 MED ORDER — ACETAMINOPHEN 325 MG PO TABS
650.0000 mg | ORAL_TABLET | Freq: Four times a day (QID) | ORAL | Status: DC | PRN
  Administered 2021-03-26 – 2021-03-30 (×9): 650 mg via ORAL
  Filled 2021-03-25 (×9): qty 2

## 2021-03-25 NOTE — ED Provider Notes (Signed)
Emergency Medicine Observation Re-evaluation Note  Benedetto Ryder is a 35 y.o. male, seen on rounds today.  Pt initially presented to the ED for complaints of Suicidal and Alcohol Problem  Currently, the patient is calm, no acute complaints.  Physical Exam  Blood pressure 120/86, pulse 77, temperature 98.4 F (36.9 C), temperature source Oral, resp. rate 16, height 5\' 9"  (1.753 m), weight 76.2 kg, SpO2 100 %. Physical Exam General: NAD Lungs: CTAB Psych: not agitated  ED Course / MDM  EKG:    I have reviewed the labs performed to date as well as medications administered while in observation.  Recent changes in the last 24 hours include no acute events overnight.  With psychiatry reassessment today, patient is still suicidal.  Plan  Current plan is for psychiatric admission. Patient is under full IVC at this time.   , MD 03/25/21 1145

## 2021-03-25 NOTE — Tx Team (Signed)
Initial Treatment Plan 03/25/2021 6:06 PM [name] [id number]  PATIENT STRESSORS: Family health concerns Loss of a friend Substance abuse  PATIENT STRENGTHS: Ability for insight Capable of independent living Communication skills   PATIENT IDENTIFIED PROBLEMS: Mother diagnosed with cancer  Friend died of a heart attack  Girlfriend attempted to hang herself                 DISCHARGE CRITERIA:  Improved stabilization in mood, thinking, and/or behavior  PRELIMINARY DISCHARGE PLAN: Return to previous living arrangement  PATIENT/FAMILY INVOLVEMENT: This treatment plan has been presented to and reviewed with the patient, [name].The patient has been given the opportunity to ask questions and make suggestions.  Celene Kras, RN 03/25/2021, 6:06 PM

## 2021-03-25 NOTE — ED Notes (Signed)
Pt requesting shower, given supplies; currently in shower.

## 2021-03-25 NOTE — ED Notes (Signed)
Given breakfast. Pt reports still feeling suicidal today. Contacts for safety while in the ED.

## 2021-03-25 NOTE — ED Notes (Signed)
Pt to be admitted to BMU after 1500 today.

## 2021-03-25 NOTE — ED Notes (Addendum)
Patient took pills easily with milk. Patient informed of pending admission to BMU. Patient is calm and cooperative.

## 2021-03-25 NOTE — BH Assessment (Signed)
Patient is to be admitted to [facility] by Dr. Neale Burly.  Attending Physician will be Dr. Toni Amend.   Patient has been assigned to room 304, by [facility] Charge Nurse Gigi.     ER staff is aware of the admission:  Drinda Butts, ER Secretary    Dr. Scotty Court, ER MD   Connye Burkitt, Patient's Nurse   Ethelene Browns, Patient Access.

## 2021-03-25 NOTE — ED Notes (Signed)
Patient wakes easily, requests that the room light be turned off. Patient was still cooperative when told that the light stay on. NAD.

## 2021-03-25 NOTE — ED Notes (Signed)

## 2021-03-25 NOTE — ED Notes (Signed)
Pt. To BHU from ED ambulatory without difficulty, to room  BHU 2. Report from [employer]. Pt. Is alert and oriented, warm and dry in no distress. Pt. Denies HI, and AVH. Pt states having SI with plan to shot self in head. Patient contracts for safety. Pt. Calm and cooperative. Pt. Made aware of security cameras and Q15 minute rounds. Pt. Encouraged to let Nursing staff know of any concerns or needs.

## 2021-03-25 NOTE — Consult Note (Signed)
[facility] Face-to-Face Psychiatry Consult   Reason for Consult: Consult for 35 year old man known to the psychiatric service presents intoxicated voicing suicidal ideation. Referring Physician: Scotty Court Patient Identification: Nathan [name] MRN:  [id number] Principal Diagnosis: Bipolar 1 disorder, depressed (HCC) Diagnosis:  Principal Problem:   Bipolar 1 disorder, depressed (HCC) Active Problems:   Alcohol use disorder, severe, dependence (HCC)   Alcohol withdrawal (HCC)   Total Time spent with patient: 1 hour  Subjective:   Nathan [name] is a 35 y.o. male patient admitted with "it has been a terrible week"  HPI: Patient seen chart reviewed.  Patient known from previous encounters.  Patient reports that this past week he has had multiple terrible stresses.  He had been living in [place] with his girlfriend and working but he says that she tried to hang herself at their home and at that point he left the home because he could not deal with her and also quit working.  He has been back here in [place] now and drinking heavily.  On admission he had claimed he was drinking "gallons" of alcohol a day which is clearly an exaggeration but probably has been drinking a lot.  Denies other drug use.  He then says that the other day he found a friend with whom he was living dead in bed from a heart attack.  This has also shaken him very badly.  He claims that yesterday he was at the home of an acquaintance and picked up a gun with the intention of shooting himself but the friend wrestled the gun away from him.  Patient has not been attending any kind of mental health treatment.  He is not on any psychiatric medicine.  Presents as very tearful and distraught but not psychotic not threatening.  Past Psychiatric History: Long history of presentations often similar.  Heavy alcohol abuse.  At times uses cannabis but not typically using other drugs.  Has been diagnosed with bipolar in the past but has  typically refused to stay on any medication.  Always declines medicine claiming it makes him feel worse.  He has had multiple self injuries and suicide attempts in the past.  Risk to Self:   Risk to Others:   Prior Inpatient Therapy:   Prior Outpatient Therapy:    Past Medical History:  Past Medical History:  Diagnosis Date  . Anxiety   . Bipolar 1 disorder (HCC)   . Chronic headache   . Depression 03/30/2020  . ETOH abuse 03/30/2020  . 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 Heroin addiction (HCC)   . Horseshoe kidney   . Marijuana smoker 03/30/2020    Past Surgical History:  Procedure Laterality Date  . TONSILLECTOMY     Family History:  Family History  Problem Relation Age of Onset  . Hypertension Mother   . Diabetes Mother   . Hypertension Father   . Heart failure Father   . Diabetes Father    Family Psychiatric  History: None reported Social History:  Social History   Substance and Sexual Activity  Alcohol Use Yes  . Alcohol/week: 24.0 standard drinks  . Types: 24 Cans of beer per week   Comment: 4 gallons a day- pt reports more than this currently     Social History   Substance and Sexual Activity  Drug Use Not Currently  . Types: Marijuana   Comment: Heroin addiction- denies current drug use    Social History   Socioeconomic History  .  Marital status: Single    Spouse name: Not on file  . Number of children: 0  . Years of education: 7811  . Highest education level: Not on file  Occupational History  . Not on file  Tobacco Use  . Smoking status: Current Every Day Smoker    Packs/day: 1.00    Types: Cigarettes  . Smokeless tobacco: Current User    Types: Chew  Vaping Use  . Vaping Use: Never used  Substance and Sexual Activity  . Alcohol use: Yes    Alcohol/week: 24.0 standard drinks    Types: 24 Cans of beer per week    Comment: 4 gallons a day- pt reports more than this currently  . Drug use: Not Currently    Types: Marijuana    Comment:  Heroin addiction- denies current drug use  . Sexual activity: Not Currently  Other Topics Concern  . Not on file  Social History Narrative  . Not on file   Social Determinants of Health   Financial Resource Strain: Not on file  Food Insecurity: Not on file  Transportation Needs: Not on file  Physical Activity: Not on file  Stress: Not on file  Social Connections: Not on file   Additional Social History:    Allergies:   Allergies  Allergen Reactions  . Geodon [Ziprasidone Hcl] Other (See Comments)    Seizures  . Onion Anaphylaxis  . Valproic Acid   . Levofloxacin Itching    Labs:  Results for orders placed or performed during the hospital encounter of 03/24/21 (from the past 48 hour(s))  Comprehensive metabolic panel     Status: Abnormal   Collection Time: 03/24/21 10:47 PM  Result Value Ref Range   Sodium 131 (L) 135 - 145 mmol/L   Potassium 3.3 (L) 3.5 - 5.1 mmol/L   Chloride 94 (L) 98 - 111 mmol/L   CO2 22 22 - 32 mmol/L   Glucose, Bld 162 (H) 70 - 99 mg/dL    Comment: Glucose reference range applies only to samples taken after fasting for at least 8 hours.   BUN 7 6 - 20 mg/dL   Creatinine, Ser 7.820.53 (L) 0.61 - 1.24 mg/dL   Calcium 9.3 8.9 - 95.610.3 mg/dL   Total Protein 8.2 (H) 6.5 - 8.1 g/dL   Albumin 4.6 3.5 - 5.0 g/dL   AST 73 (H) 15 - 41 U/L   ALT 56 (H) 0 - 44 U/L   Alkaline Phosphatase 92 38 - 126 U/L   Total Bilirubin 1.2 0.3 - 1.2 mg/dL   GFR, Estimated >21>60 >30>60 mL/min    Comment: (NOTE) Calculated using the CKD-EPI Creatinine Equation (2021)    Anion gap 15 5 - 15    Comment: Performed at [facility], [address] [place], [place] [address]  Ethanol     Status: Abnormal   Collection Time: 03/24/21 10:47 PM  Result Value Ref Range   Alcohol, Ethyl (B) 221 (H) <10 mg/dL    Comment: (NOTE) Lowest detectable limit for serum alcohol is 10 mg/dL.  For medical purposes only. Performed at [facility], [address]  [place], [place] [address]   cbc     Status: Abnormal   Collection Time: 03/24/21 10:47 PM  Result Value Ref Range   WBC 11.9 (H) 4.0 - 10.5 K/uL   RBC 4.89 4.22 - 5.81 MIL/uL   Hemoglobin 16.3 13.0 - 17.0 g/dL   HCT 95.245.9 84.139.0 - 32.452.0 %   MCV  93.9 80.0 - 100.0 fL   MCH 33.3 26.0 - 34.0 pg   MCHC 35.5 30.0 - 36.0 g/dL   RDW 00.9 38.1 - 82.9 %   Platelets 246 150 - 400 K/uL   nRBC 0.0 0.0 - 0.2 %    Comment: Performed at [facility], [address], [place], [place] [address]  Urine Drug Screen, Qualitative     Status: Abnormal   Collection Time: 03/24/21 10:47 PM  Result Value Ref Range   Tricyclic, Ur Screen NONE DETECTED NONE DETECTED   Amphetamines, Ur Screen NONE DETECTED NONE DETECTED   MDMA (Ecstasy)Ur Screen NONE DETECTED NONE DETECTED   Cocaine Metabolite,Ur [place] POSITIVE (A) NONE DETECTED   Opiate, Ur Screen NONE DETECTED NONE DETECTED   Phencyclidine (PCP) Ur S NONE DETECTED NONE DETECTED   Cannabinoid 50 Ng, Ur [place] POSITIVE (A) NONE DETECTED   Barbiturates, Ur Screen NONE DETECTED NONE DETECTED   Benzodiazepine, Ur Scrn NONE DETECTED NONE DETECTED   Methadone Scn, Ur NONE DETECTED NONE DETECTED    Comment: (NOTE) Tricyclics + metabolites, urine    Cutoff 1000 ng/mL Amphetamines + metabolites, urine  Cutoff 1000 ng/mL MDMA (Ecstasy), urine              Cutoff 500 ng/mL Cocaine Metabolite, urine          Cutoff 300 ng/mL Opiate + metabolites, urine        Cutoff 300 ng/mL Phencyclidine (PCP), urine         Cutoff 25 ng/mL Cannabinoid, urine                 Cutoff 50 ng/mL Barbiturates + metabolites, urine  Cutoff 200 ng/mL Benzodiazepine, urine              Cutoff 200 ng/mL Methadone, urine                   Cutoff 300 ng/mL  The urine drug screen provides only a preliminary, unconfirmed analytical test result and should not be used for non-medical purposes. Clinical consideration and professional judgment should be applied to any positive drug screen result due  to possible interfering substances. A more specific alternate chemical method must be used in order to obtain a confirmed analytical result. Gas chromatography / mass spectrometry (GC/MS) is the preferred confirm atory method. Performed at [facility], [address] [place], [place] [address]   Resp Panel by RT-PCR (Flu A&B, Covid) Nasopharyngeal Swab     Status: None   Collection Time: 03/24/21 11:54 PM   Specimen: Nasopharyngeal Swab; Nasopharyngeal(NP) swabs in vial transport medium  Result Value Ref Range   SARS Coronavirus 2 by RT PCR NEGATIVE NEGATIVE    Comment: (NOTE) SARS-CoV-2 target nucleic acids are NOT DETECTED.  The SARS-CoV-2 RNA is generally detectable in upper respiratory specimens during the acute phase of infection. The lowest concentration of SARS-CoV-2 viral copies this assay can detect is 138 copies/mL. A negative result does not preclude SARS-Cov-2 infection and should not be used as the sole basis for treatment or other patient management decisions. A negative result may occur with  improper specimen collection/handling, submission of specimen other than nasopharyngeal swab, presence of viral mutation(s) within the areas targeted by this assay, and inadequate number of viral copies(<138 copies/mL). A negative result must be combined with clinical observations, patient history, and epidemiological information. The expected result is Negative.  Fact Sheet for Patients:  BloggerCourse.com  Fact Sheet for Healthcare Providers:  SeriousBroker.it  This test is no t yet approved or cleared by the [place] and  has been authorized for detection and/or diagnosis of SARS-CoV-2 by FDA under an Emergency Use Authorization (EUA). This EUA will remain  in effect (meaning this test can be used) for the duration of the COVID-19 declaration under Section 564(b)(1) of the Act, 21 U.S.C.section  360bbb-3(b)(1), unless the authorization is terminated  or revoked sooner.       Influenza A by PCR NEGATIVE NEGATIVE   Influenza B by PCR NEGATIVE NEGATIVE    Comment: (NOTE) The Xpert Xpress SARS-CoV-2/FLU/RSV plus assay is intended as an aid in the diagnosis of influenza from Nasopharyngeal swab specimens and should not be used as a sole basis for treatment. Nasal washings and aspirates are unacceptable for Xpert Xpress SARS-CoV-2/FLU/RSV testing.  Fact Sheet for Patients: BloggerCourse.com  Fact Sheet for Healthcare Providers: SeriousBroker.it  This test is not yet approved or cleared by the [place] FDA and has been authorized for detection and/or diagnosis of SARS-CoV-2 by FDA under an Emergency Use Authorization (EUA). This EUA will remain in effect (meaning this test can be used) for the duration of the COVID-19 declaration under Section 564(b)(1) of the Act, 21 U.S.C. section 360bbb-3(b)(1), unless the authorization is terminated or revoked.  Performed at [facility], [address], [place], [place] [address]     Current Facility-Administered Medications  Medication Dose Route Frequency Provider Last Rate Last Admin  . chlordiazePOXIDE (LIBRIUM) capsule 50 mg  50 mg Oral TID Nita Sickle, MD   50 mg at 03/25/21 0930   Current Outpatient Medications  Medication Sig Dispense Refill  . traZODone (DESYREL) 100 MG tablet Take 1 tablet (100 mg total) by mouth at bedtime as needed for sleep. (Patient not taking: Reported on 03/24/2021) 30 tablet 1    Musculoskeletal: Strength & Muscle Tone: within normal limits Gait & Station: normal Patient leans: N/A            Psychiatric Specialty Exam:  Presentation  General Appearance: No data recorded Eye Contact:No data recorded Speech:No data recorded Speech Volume:No data recorded Handedness:No data recorded  Mood and Affect  Mood:No  data recorded Affect:No data recorded  Thought Process  Thought Processes:No data recorded Descriptions of Associations:No data recorded Orientation:No data recorded Thought Content:No data recorded History of Schizophrenia/Schizoaffective disorder:No  Duration of Psychotic Symptoms:No data recorded Hallucinations:No data recorded Ideas of Reference:No data recorded Suicidal Thoughts:No data recorded Homicidal Thoughts:No data recorded  Sensorium  Memory:No data recorded Judgment:No data recorded Insight:No data recorded  Executive Functions  Concentration:No data recorded Attention Span:No data recorded Recall:No data recorded Fund of Knowledge:No data recorded Language:No data recorded  Psychomotor Activity  Psychomotor Activity:No data recorded  Assets  Assets:No data recorded  Sleep  Sleep:No data recorded  Physical Exam: Physical Exam Vitals and nursing note reviewed.  Constitutional:      Appearance: Normal appearance.  HENT:     Head: Normocephalic and atraumatic.     Mouth/Throat:     Pharynx: Oropharynx is clear.  Eyes:     Pupils: Pupils are equal, round, and reactive to light.  Cardiovascular:     Rate and Rhythm: Normal rate and regular rhythm.  Pulmonary:     Effort: Pulmonary effort is normal.     Breath sounds: Normal breath sounds.  Abdominal:     General: Abdomen is flat.     Palpations: Abdomen is soft.  Musculoskeletal:        General: Normal  range of motion.  Skin:    General: Skin is warm and dry.  Neurological:     General: No focal deficit present.     Mental Status: He is alert. Mental status is at baseline.  Psychiatric:        Attention and Perception: He is inattentive.        Mood and Affect: Mood is anxious and depressed. Affect is tearful.        Speech: Speech is slurred.        Behavior: Behavior is agitated. Behavior is not aggressive or hyperactive.        Thought Content: Thought content includes suicidal ideation.  Thought content includes suicidal plan.        Cognition and Memory: Memory is impaired.        Judgment: Judgment is impulsive.    Review of Systems  Constitutional: Negative.   HENT: Negative.   Eyes: Negative.   Respiratory: Negative.   Cardiovascular: Negative.   Gastrointestinal: Negative.   Musculoskeletal: Negative.   Skin: Negative.   Neurological: Negative.   Psychiatric/Behavioral: Positive for depression, memory loss, substance abuse and suicidal ideas. Negative for hallucinations. The patient is nervous/anxious and has insomnia.    Blood pressure 120/86, pulse 77, temperature 98.4 F (36.9 C), temperature source Oral, resp. rate 16, height  (1.753 m), weight 76.2 kg, SpO2 100 %. Body mass index is 24.81 kg/m.  Treatment Plan Summary: Medication management and Plan 35 year old man with alcohol abuse and bipolar depression.  Active suicidal thoughts.  Not apparently psychotic.  Drinking.  No history of seizures in the past.  Patient appropriate for admission to psychiatric ward.  Case reviewed with TTS and unit downstairs.  Recommend admission with detox protocol orders in place.  Patient agreeable to plan.  Case reviewed with ER physician.  Disposition: Recommend psychiatric Inpatient admission when medically cleared. Supportive therapy provided about ongoing stressors.  Mordecai Rasmussen, MD 03/25/2021 11:23 AM

## 2021-03-25 NOTE — ED Notes (Signed)
Back to room. All of shower supplies obtained back from patient. Pt wearing clean new scrubs. Denies further needs at this time.

## 2021-03-25 NOTE — ED Provider Notes (Signed)
[facility] Emergency Department Provider Note  ____________________________________________  Time seen: Approximately 12:31 AM  I have reviewed the triage vital signs and the nursing notes.   HISTORY  Chief Complaint Suicidal and Alcohol Problem   HPI Nathan [name] is a 35 y.o. male with a history of bipolar disorder, anxiety, hepatitis C, heroin addiction, alcohol addiction who presents for suicidal ideation.  Patient reports that he had a really rough week.  His mother was diagnosed with cancer, friend of his died, his girlfriend try to hang herself.  This evening he was drinking alcohol at a friend's house and became very emotional.  The friend had a gun and patient grabbed the gun and put it in his mouth.  Patient reports that he really felt like he was going to pull the trigger.  His friend wrestle him for the gun and was able to get it away from him.  The friend then called 911.  Patient reports that today is his birthday which made him even more depressed.  He does endorse drinking alcohol but denies any other drug use.   Past Medical History:  Diagnosis Date  . Anxiety   . Bipolar 1 disorder (HCC)   . Chronic headache   . Depression 03/30/2020  . ETOH abuse 03/30/2020  . 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 Heroin addiction (HCC)   . Horseshoe kidney   . Marijuana smoker 03/30/2020    Patient Active Problem List   Diagnosis Date Noted  . Severe recurrent major depression without psychotic features (HCC) 02/05/2021  . Abdominal pain   . Moderate alcohol use disorder (HCC) 08/29/2020  . Intentional overdose of drug in tablet form (HCC) 08/28/2020  . Schizoaffective disorder (HCC) 07/26/2020  . 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 Chronic headache 06/26/2020  . Encounter to establish care 06/26/2020  . History of gastroesophageal reflux (GERD) 06/26/2020  . Cannabis use disorder, moderate, in controlled environment (HCC) 04/10/2020  .  Cocaine use disorder (HCC) 04/10/2020  . Alcohol use disorder, severe, dependence (HCC) 04/09/2020  . Stress reaction, acute 04/09/2020  . Non-suicidal self harm 04/05/2020  . Bipolar 1 disorder, depressed (HCC) 01/31/2018  . Intercostal pain 09/07/2016  . Bipolar 1 disorder, mixed, severe (HCC) 04/22/2016  . Alcohol abuse 04/22/2016  . Suicidal ideation 04/22/2016  . Tobacco use disorder 04/22/2016  . 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 Cocaine abuse in remission (HCC) 04/26/2012    Past Surgical History:  Procedure Laterality Date  . TONSILLECTOMY      Prior to Admission medications   Medication Sig Start Date End Date Taking? Authorizing Provider  traZODone (DESYREL) 100 MG tablet Take 1 tablet (100 mg total) by mouth at bedtime as needed for sleep. Patient not taking: Reported on 03/24/2021 02/09/21   Jesse Sans, MD    Allergies Geodon [ziprasidone hcl], Onion, Valproic acid, and Levofloxacin  Family History  Problem Relation Age of Onset  . Hypertension Mother   . Diabetes Mother   . Hypertension Father   . Heart failure Father   . Diabetes Father     Social History Social History   Tobacco Use  . Smoking status: Current Every Day Smoker    Packs/day: 1.00    Types: Cigarettes  . Smokeless tobacco: Current User    Types: Chew  Vaping Use  . Vaping Use: Never used  Substance Use Topics  . Alcohol use: Yes    Alcohol/week: 24.0 standard  drinks    Types: 24 Cans of beer per week    Comment: 4 gallons a day- pt reports more than this currently  . Drug use: Not Currently    Types: Marijuana    Comment: Heroin addiction- denies current drug use    Review of Systems  Constitutional: Negative for fever. Eyes: Negative for visual changes. ENT: Negative for sore throat. Neck: No neck pain  Cardiovascular: Negative for chest pain. Respiratory: Negative for shortness of breath. Gastrointestinal: Negative for abdominal  pain, vomiting or diarrhea. Genitourinary: Negative for dysuria. Musculoskeletal: Negative for back pain. Skin: Negative for rash. Neurological: Negative for headaches, weakness or numbness. Psych: + SI. No HI  ____________________________________________   PHYSICAL EXAM:  VITAL SIGNS: ED Triage Vitals  Enc Vitals Group     BP 03/24/21 2241 (!) 159/97     Pulse Rate 03/24/21 2241 (!) 115     Resp 03/24/21 2241 18     Temp 03/24/21 2241 98.6 F (37 C)     Temp Source 03/24/21 2241 Oral     SpO2 03/24/21 2241 95 %     Weight 03/24/21 2242 168 lb (76.2 kg)     Height 03/24/21 2242 5\' 9"  (1.753 m)     Head Circumference --      Peak Flow --      Pain Score 03/24/21 2242 3     Pain Loc --      Pain Edu? --      Excl. in GC? --     Constitutional: Alert and oriented. Well appearing and in no apparent distress. HEENT:      Head: Normocephalic and atraumatic.         Eyes: Conjunctivae are normal. Sclera is non-icteric.       Mouth/Throat: Mucous membranes are moist.       Neck: Supple with no signs of meningismus. Cardiovascular: Regular rate and rhythm.  Respiratory: Normal respiratory effort.  Gastrointestinal: Soft, non tender, and non distended. Musculoskeletal: No edema, cyanosis, or erythema of extremities. Neurologic: Normal speech and language. Face is symmetric. Moving all extremities. No gross focal neurologic deficits are appreciated. Skin: Skin is warm, dry and intact. No rash noted. Psychiatric: Mood and affect are depressed. Speech and behavior are normal.  ____________________________________________   LABS (all labs ordered are listed, but only abnormal results are displayed)  Labs Reviewed  COMPREHENSIVE METABOLIC PANEL - Abnormal; Notable for the following components:      Result Value   Sodium 131 (*)    Potassium 3.3 (*)    Chloride 94 (*)    Glucose, Bld 162 (*)    Creatinine, Ser 0.53 (*)    Total Protein 8.2 (*)    AST 73 (*)    ALT 56 (*)     All other components within normal limits  ETHANOL - Abnormal; Notable for the following components:   Alcohol, Ethyl (B) 221 (*)    All other components within normal limits  CBC - Abnormal; Notable for the following components:   WBC 11.9 (*)    All other components within normal limits  URINE DRUG SCREEN, QUALITATIVE ([facility] ONLY) - Abnormal; Notable for the following components:   Cocaine Metabolite,Ur [place] POSITIVE (*)    Cannabinoid 50 Ng, Ur [place] POSITIVE (*)    All other components within normal limits  RESP PANEL BY RT-PCR (FLU A&B, COVID) [id number]   ____________________________________________  EKG  none  ____________________________________________  RADIOLOGY  none  ____________________________________________  PROCEDURES  Procedure(s) performed: None Procedures Critical Care performed:  None ____________________________________________   INITIAL IMPRESSION / ASSESSMENT AND PLAN / ED COURSE  35 y.o. male with a history of bipolar disorder, anxiety, hepatitis C, heroin addiction, alcohol addiction who presents for suicidal ideation.  Patient had a gun in his mouth earlier today and reports that he was going to pull the trigger if his friend had not taken the gun away from him.  Has been under a lot of stress this week.  Continues to endorse depression and SI.  Patient has a history of alcohol abuse.  At this time CIWA 0.  Will start patient on Librium taper.  Patient is complaining of headache.  We will give him ibuprofen and Zofran.  Patient was placed under IVC by me.  Blood work showing alcohol level of 221, UDS positive for cocaine and cannabinoids.  Electrolytes within patient's range of normal.  Psychiatry has been consulted.  Old medical records review  The patient has been placed in psychiatric observation due to the need to provide a safe environment for the patient while obtaining psychiatric consultation and evaluation, as well as ongoing medical and medication  management to treat the patient's condition.  The patient has been placed under full IVC at this time.       Please note:  Patient was evaluated in Emergency Department today for the symptoms described in the history of present illness. Patient was evaluated in the context of the global COVID-19 pandemic, which necessitated consideration that the patient might be at risk for infection with the SARS-CoV-2 virus that causes COVID-19. Institutional protocols and algorithms that pertain to the evaluation of patients at risk for COVID-19 are in a state of rapid change based on information released by regulatory bodies including the CDC and federal and state organizations. These policies and algorithms were followed during the patient's care in the ED.  Some ED evaluations and interventions may be delayed as a result of limited staffing during the pandemic.    ____________________________________________   FINAL CLINICAL IMPRESSION(S) / ED DIAGNOSES   Final diagnoses:  Suicidal ideation  Alcohol abuse  Cocaine abuse (HCC)      NEW MEDICATIONS STARTED DURING THIS VISIT:  ED Discharge Orders    None       Note:  This document was prepared using Dragon voice recognition software and may include unintentional dictation errors.    Don Perking, [place], MD 03/25/21 [phone]
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
## 2021-03-25 NOTE — ED Notes (Signed)
Pt given lunch

## 2021-03-25 NOTE — ED Notes (Signed)
Report received from [facility] Patient to be transferred to [facility] room 2.

## 2021-03-25 NOTE — BH Assessment (Signed)
Patient is under review with [facility].  
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
## 2021-03-25 NOTE — Progress Notes (Signed)
Patient presents with anxiety during assessment. Pt endorses passive SI but verbally contracts for safety. Pt compliant with medication administration per MD orders. Pt given education, support, and encouragement to be active in his treatment plan. Patient being monitored Q 15 minutes for safety per unit protocol. Pt remains safe on the unit.  

## 2021-03-25 NOTE — ED Notes (Signed)
Pt moved over to [facility] with report provided to Selena Batten, Charity fundraiser.

## 2021-03-25 NOTE — BH Assessment (Signed)
Comprehensive Clinical Assessment (CCA) Note  03/25/2021 [facility] [id number]  Chief Complaint: Patient is a 35 year old male presenting to [facility] ED under IVC. Per triage note Pt presents to the ED with a cc of suicidal ideation with a plan to shoot himself in the head with a gun tonight. Pt also reports heavy ETOH use daily. Pt tearful in triage and states that he didn't kill himself because of his 66 year old son. Not currently taking any medications. During assessment patient appears alert and oriented x4, calm and cooperative. Patient reports his reason for presenting to the ED "the police brought me here because I was trying to kill myself with a gun." Patient reports that prior to presenting to the ED he reports that he had been doing well. Patient was just recently discharged from [facility] in 01/2021 and he reports since then he has been doing well. Patient reports what triggered him tonight he reports "I found my friend dead, found out my mom has a cancer, and it's my birthday, this all happened since the first of the month, it's been hard." Patient continues to report SI and reports having an attempt in the past. Patient reports that he was going to [facility] for therapy but reports "since my therapist died I haven't been." Patient reports a lack of sleep and no apetite. Patient reports increased alcohol use "I've been drinking 5-6 gallons of alcohol a day." Patient does not report any other use, patient UDS is positive for Cocaine and Cannabinoids. Patient BAL is 221. Patient denies HI/AH/VH and does not appear to be responding to any internal or external stimuli.  Patient disposition pending Chief Complaint  Patient presents with  . Suicidal  . Alcohol Problem   Visit Diagnosis: Major Depressive Disorder, severe. Alcohol Use Disorder, severe, Cocaine use, Cannabis use   CCA Screening, Triage and Referral (STR)  Patient Reported Information How did you hear about [place]? Legal  System  Referral name: No data recorded Referral phone number: No data recorded  Whom do you see for routine medical problems? I don't have a doctor  Practice/Facility Name: No data recorded Practice/Facility Phone Number: No data recorded Name of Contact: No data recorded Contact Number: No data recorded Contact Fax Number: No data recorded Prescriber Name: No data recorded Prescriber Address (if known): No data recorded  What Is the Reason for Your Visit/Call Today? SI & ETOH Detox  How Long Has This Been Causing You Problems? > than 6 months  What Do You Feel Would Help You the Most Today? Alcohol or Drug Use Treatment; Treatment for Depression or other mood problem   Have You Recently Been in Any Inpatient Treatment (Hospital/Detox/Crisis Center/28-Day Program)? No  Name/Location of Program/Hospital:No data recorded How Long Were You There? No data recorded When Were You Discharged? No data recorded  Have You Ever Received Services From [facility] Before? Yes  Who Do You See at [facility]? ED & INPT   Have You Recently Had Any Thoughts About Hurting Yourself? Yes  Are You Planning to Commit Suicide/Harm Yourself At This time? Yes   Have you Recently Had Thoughts About Hurting Someone Karolee Ohs? No  Explanation: No data recorded  Have You Used Any Alcohol or Drugs in the Past 24 Hours? Yes  How Long Ago Did You Use Drugs or Alcohol? No data recorded What Did You Use and How Much? Alcohol "5-6 gallons a day"   Do You Currently Have a Therapist/Psychiatrist? No  Name  of Therapist/Psychiatrist: No data recorded  Have You Been Recently Discharged From Any Office Practice or Programs? No  Explanation of Discharge From Practice/Program: No data recorded    CCA Screening Triage Referral Assessment Type of Contact: Face-to-Face  Is this Initial or Reassessment? No data recorded Date Telepsych consult ordered in [facility]:  08/27/2020  [facility]   Patient Reported Information Reviewed? Yes  Patient Left Without Being Seen? No data recorded Reason for Not Completing Assessment: No data recorded  Collateral Involvement: Toniann FailWendy (Ex-girlfriend) [phone]   Does Patient Have a Court Appointed Legal Guardian? No data recorded Name and Contact of Legal Guardian: Self  If Minor and Not Living with Parent(s), Who has Custody? n/a  Is CPS involved or ever been involved? Never  Is APS involved or ever been involved? Never   Patient Determined To Be At Risk for Harm To Self or Others Based on Review of Patient Reported Information or Presenting Complaint? Yes, for Self-Harm  Method: No data recorded Availability of Means: No data recorded Intent: No data recorded Notification Required: No data recorded Additional Information for Danger to Others Potential: No data recorded Additional Comments for Danger to Others Potential: No data recorded Are There Guns or Other Weapons in Your Home? No data recorded Types of Guns/Weapons: No data recorded Are These Weapons Safely Secured?                            No data recorded Who Could Verify You Are Able To Have These Secured: No data recorded Do You Have any Outstanding Charges, Pending Court Dates, Parole/Probation? No data recorded Contacted To Inform of Risk of Harm To Self or Others: No data recorded  [facility]   Does Patient Present under Involuntary Commitment? Yes  IVC Papers Initial File Date: 03/25/2021   [place]   Patient Currently Receiving the Following Services: Not Receiving Services   Determination of Need: Emergent (2 hours)   Options For Referral: Intensive Outpatient Therapy; Medication Management     CCA Biopsychosocial Intake/Chief Complaint:  Patient is presenting under IVC due to SI with plan to shoot himself with a gun and alcohol intoxication  Current Symptoms/Problems: Patient is  presenting under IVC due to SI with plan to shoot himself with a gun and alcohol intoxication   Patient Reported Schizophrenia/Schizoaffective Diagnosis in Past: No   Strengths: Patient is able to communicate his needs  Preferences: Unknown  Abilities: Patient is able to communicate his needs   Type of Services Patient Feels are Needed: Unknown   Initial Clinical Notes/Concerns: None   Mental Health Symptoms Depression:  Change in energy/activity; Hopelessness; Increase/decrease in appetite; Sleep (too much or little); Tearfulness; Worthlessness   Duration of Depressive symptoms: Greater than two weeks   Mania:  None   Anxiety:   Difficulty concentrating   Psychosis:  None   Duration of Psychotic symptoms: No data recorded  Trauma:  None   Obsessions:  None   Compulsions:  None   Inattention:  None   Hyperactivity/Impulsivity:  N/A   Oppositional/Defiant Behaviors:  None   Emotional Irregularity:  None   Other Mood/Personality Symptoms:  No data recorded   Mental Status Exam Appearance and self-care  Stature:  Average   Weight:  Average weight   Clothing:  Disheveled   Grooming:  Neglected   Cosmetic use:  None   Posture/gait:  Normal   Motor activity:  Not Remarkable   Sensorium  Attention:  Normal   Concentration:  Normal   Orientation:  X5   Recall/memory:  Normal   Affect and Mood  Affect:  Depressed; Flat   Mood:  Depressed; Hopeless   Relating  Eye contact:  Normal   Facial expression:  Responsive   Attitude toward examiner:  Cooperative   Thought and Language  Speech flow: Clear and Coherent   Thought content:  Appropriate to Mood and Circumstances   Preoccupation:  None   Hallucinations:  None   Organization:  No data recorded  [employer] of Knowledge:  Fair   Intelligence:  Average   Abstraction:  Functional   Judgement:  Impaired   Reality Testing:  Realistic   Insight:  Fair   Decision  Making:  Impulsive   Social Functioning  Social Maturity:  Isolates   Social Judgement:  Heedless   Stress  Stressors:  Grief/losses; Financial   Coping Ability:  Exhausted   Skill Deficits:  None   Supports:  Support needed     Religion: Religion/Spirituality Are You A Religious Person?: No  Leisure/Recreation: Leisure / Recreation Do You Have Hobbies?: No  Exercise/Diet: Exercise/Diet Do You Exercise?: No Have You Gained or Lost A Significant Amount of Weight in the Past Six Months?: No Do You Follow a Special Diet?: No Do You Have Any Trouble Sleeping?: Yes Explanation of Sleeping Difficulties: Patient reports a lack of sleep   CCA Employment/Education Employment/Work Situation: Employment / Work Situation Employment situation: Unemployed Patient's job has been impacted by current illness: No What is the longest time patient has a held a job?: [place] Where was the patient employed at that time?: Unknown Has patient ever been in the [employer]?: No  Education: Education Is Patient Currently Attending School?: No Did Garment/textile technologist From [employer]?:  (Unknown) Did You Have An Individualized Education Program (IIEP): No Did You Have Any Difficulty At [employer]?: No Patient's Education Has Been Impacted by Current Illness: No   CCA Family/Childhood History Family and Relationship History: Family history Marital status: Single Are you sexually active?: No What is your sexual orientation?: Heterosexual Has your sexual activity been affected by drugs, alcohol, medication, or emotional stress?: Denies Does patient have children?: Yes How many children?: 2 How is patient's relationship with their children?: Pt reports his daughter to live in [place] and no contact; Pt reports to be close with his son  Childhood History:  Childhood History By whom was/is the patient raised?: Mother Additional childhood history information: Pt reports to be unsure "I can't really  think right now" Description of patient's relationship with caregiver when they were a child: Pt did not disclose How were you disciplined when you got in trouble as a child/adolescent?: Pt did not disclose Does patient have siblings?:  (unknown) Did patient suffer any verbal/emotional/physical/sexual abuse as a child?:  (pt did not disclose) Did patient suffer from severe childhood neglect?:  (pt did not disclose) Has patient ever been sexually abused/assaulted/raped as an adolescent or adult?:  (None reported) Was the patient ever a victim of a crime or a disaster?:  (None reported) Witnessed domestic violence?:  (None reported) Has patient been affected by domestic violence as an adult?: No  Child/Adolescent Assessment:     CCA Substance Use Alcohol/Drug Use: Alcohol / Drug Use Pain Medications: see MAR Prescriptions: see MAR Over the Counter: see MAR History of alcohol / drug use?: Yes Longest period  of sobriety (when/how long): unsure Negative Consequences of Use: Financial,Work / School Withdrawal Symptoms: Agitation,Irritability,Seizures Onset of Seizures: Unknown Date of most recent seizure: Unknown Substance #1 Name of Substance 1: ETOH 1 - Frequency: DAILY 1 - Last Use / Amount: 5-6 Galloon vodka/moonshine 1- Route of Use: Oral Substance #2 2 - Age of First Use: Marijuana 2 - Frequency: sometimes 2 - Last Use / Amount: over a week ago                     ASAM's:  Six Dimensions of Multidimensional Assessment  Dimension 1:  Acute Intoxication and/or Withdrawal Potential:      Dimension 2:  Biomedical Conditions and Complications:      Dimension 3:  Emotional, Behavioral, or Cognitive Conditions and Complications:     Dimension 4:  Readiness to Change:     Dimension 5:  Relapse, Continued use, or Continued Problem Potential:     Dimension 6:  Recovery/Living Environment:     ASAM Severity Score:    ASAM Recommended Level of Treatment:     Substance  use Disorder (SUD) Substance Use Disorder (SUD)  Checklist Symptoms of Substance Use: Continued use despite having a persistent/recurrent physical/psychological problem caused/exacerbated by use,Evidence of tolerance,Evidence of withdrawal (Comment),Presence of craving or strong urge to use,Social, occupational, recreational activities given up or reduced due to use,Substance(s) often taken in larger amounts or over longer times than was intended,Recurrent use that results in a failure to fulfill major role obligations (work, school, home),Large amounts of time spent to obtain, use or recover from the substance(s),Continued use despite persistent or recurrent social, interpersonal problems, caused or exacerbated by use,Persistent desire or unsuccessful efforts to cut down or control use,Repeated use in physically hazardous situations  Recommendations for Services/Supports/Treatments:  Disposition pending  DSM5 Diagnoses: Patient Active Problem List   Diagnosis Date Noted  . Severe recurrent major depression without psychotic features (HCC) 02/05/2021  . Abdominal pain   . Moderate alcohol use disorder (HCC) 08/29/2020  . Intentional overdose of drug in tablet form (HCC) 08/28/2020  . Schizoaffective disorder (HCC) 07/26/2020  . 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 Chronic headache 06/26/2020  . Encounter to establish care 06/26/2020  . History of gastroesophageal reflux (GERD) 06/26/2020  . Cannabis use disorder, moderate, in controlled environment (HCC) 04/10/2020  . Cocaine use disorder (HCC) 04/10/2020  . Alcohol use disorder, severe, dependence (HCC) 04/09/2020  . Stress reaction, acute 04/09/2020  . Non-suicidal self harm 04/05/2020  . Bipolar 1 disorder, depressed (HCC) 01/31/2018  . Intercostal pain 09/07/2016  . Bipolar 1 disorder, mixed, severe (HCC) 04/22/2016  . Alcohol abuse 04/22/2016  . Suicidal ideation 04/22/2016  . Tobacco use disorder 04/22/2016  . Substance or  medication-induced anxiety disorder (HCC) 04/21/2016  . Cocaine abuse in remission [facility]) 04/26/2012    Patient Centered Plan: Patient is on the following Treatment Plan(s):  Depression and Substance Abuse   Referrals to Alternative Service(s): Referred to Alternative Service(s):   Place:   Date:   Time:    Referred to Alternative Service(s):   Place:   Date:   Time:    Referred to Alternative Service(s):   Place:   Date:   Time:    Referred to Alternative Service(s):   Place:   Date:   Time:     Lynesha Bango A Kessler Kopinski, LCAS-A

## 2021-03-25 NOTE — Plan of Care (Signed)
Patient new to the unit today, hasn't had time to progress  Problem: Education: Goal: Emotional status will improve Outcome: Not Progressing Goal: Mental status will improve Outcome: Not Progressing   

## 2021-03-26 DIAGNOSIS — F319 Bipolar disorder, unspecified: Secondary | ICD-10-CM

## 2021-03-26 MED ORDER — THIAMINE HCL 100 MG PO TABS
100.0000 mg | ORAL_TABLET | Freq: Every day | ORAL | Status: DC
  Administered 2021-03-26 – 2021-03-30 (×5): 100 mg via ORAL
  Filled 2021-03-26 (×5): qty 1

## 2021-03-26 MED ORDER — ADULT MULTIVITAMIN W/MINERALS CH
1.0000 | ORAL_TABLET | Freq: Every day | ORAL | Status: DC
Start: 2021-03-26 — End: 2021-03-30
  Administered 2021-03-26 – 2021-03-30 (×5): 1 via ORAL
  Filled 2021-03-26 (×5): qty 1

## 2021-03-26 MED ORDER — FOLIC ACID 1 MG PO TABS
1.0000 mg | ORAL_TABLET | Freq: Every day | ORAL | Status: DC
  Administered 2021-03-26 – 2021-03-30 (×5): 1 mg via ORAL
  Filled 2021-03-26 (×5): qty 1

## 2021-03-26 MED ORDER — HYDROXYZINE HCL 25 MG PO TABS
25.0000 mg | ORAL_TABLET | Freq: Three times a day (TID) | ORAL | Status: DC | PRN
  Administered 2021-03-26 – 2021-03-29 (×5): 25 mg via ORAL
  Filled 2021-03-26 (×5): qty 1

## 2021-03-26 MED ORDER — LAMOTRIGINE 25 MG PO TABS
25.0000 mg | ORAL_TABLET | Freq: Every day | ORAL | Status: DC
  Administered 2021-03-26 – 2021-03-30 (×5): 25 mg via ORAL
  Filled 2021-03-26 (×5): qty 1

## 2021-03-26 MED ORDER — THIAMINE HCL 100 MG/ML IJ SOLN: 100.0000 mg | Freq: Every day | INTRAMUSCULAR | Status: DC

## 2021-03-26 MED ORDER — LORAZEPAM 1 MG PO TABS
1.0000 mg | ORAL_TABLET | ORAL | Status: AC | PRN
  Administered 2021-03-27 (×2): 1 mg via ORAL
  Administered 2021-03-27: 2 mg via ORAL
  Administered 2021-03-27: 1 mg via ORAL
  Administered 2021-03-28 (×2): 2 mg via ORAL
  Filled 2021-03-26: qty 2
  Filled 2021-03-26: qty 1
  Filled 2021-03-26: qty 2
  Filled 2021-03-26: qty 1
  Filled 2021-03-26: qty 2
  Filled 2021-03-26: qty 1

## 2021-03-26 MED ORDER — ENSURE ENLIVE PO LIQD
237.0000 mL | Freq: Three times a day (TID) | ORAL | Status: DC
  Administered 2021-03-26 – 2021-03-30 (×9): 237 mL via ORAL

## 2021-03-26 MED ORDER — LORAZEPAM 2 MG/ML IJ SOLN: 1.0000 mg | INTRAMUSCULAR | Status: AC | PRN

## 2021-03-26 NOTE — Plan of Care (Signed)
Patient presents with anxiety and irritability   Problem: Education: Goal: Emotional status will improve Outcome: Not Progressing Goal: Mental status will improve Outcome: Not Progressing

## 2021-03-26 NOTE — BHH Suicide Risk Assessment (Signed)
[facility] Admission Suicide Risk Assessment   Nursing information obtained from:  Patient Demographic factors:  Male,Unemployed Current Mental Status:  Suicidal ideation indicated by patient Loss Factors:  Decrease in vocational status,Loss of significant relationship Historical Factors:  Prior suicide attempts Risk Reduction Factors:  NA  Total Time spent with patient: 1 hour Principal Problem: Bipolar depression (HCC) Diagnosis:  Principal Problem:   Bipolar depression (HCC) Active Problems:   Substance or medication-induced anxiety disorder (HCC)   Tobacco use disorder   Alcohol use disorder, severe, dependence (HCC)   Cannabis use disorder, moderate, in controlled environment (HCC)  Subjective Data: 35 year old male with bipolar I disorder presenting with suicidal ideations with plan to shoot himself in the head. No acute events overnight, medication compliant, attending to ADLS.   Patient seen one-on-one this morning. He notes that he had a terrible week. One of his friends had a heart attack and died, his girlfriend attempted to hang herself, and his mother was diagnosed with cancer. He quit his job and left [place] to return to [employer] and began drinking heavily again. He reports drinking 5-6 gallons of alcohol per day. He became increasingly depressed and took his friends gun and held it to his mouth. He was planning to kill himself, but friend wrestled the gun away. He then presented to the emergency room to get help. He notes he still has suicidal ideations with plan to shot himself, but is able to contract for safety in the hospital. He also notes some general desire to hurt other people without specific target or intent. He denies auditory and visual hallucinations. He states his main motivation to improve his mood and not kill himself is his 51-year-old son. He has had numerous medication trials in the past he did not feel were helpful. Discussed RBA of risperdal, lithium, and  lamictal. He opts to try Lamictal at this time. He declines FDA approved medication to assist with alcohol cravings. He states he will not follow-up with any outpatient providers, so would like to have inpatient treatment for substance abuse. However, he also adds he would decline referrals to [place], [facility], [facility], [facility], or anywhere far away that would not provide transportation back home.   Continued Clinical Symptoms:  Alcohol Use Disorder Identification Test Final Score (AUDIT): 40 The "Alcohol Use Disorders Identification Test", Guidelines for Use in Primary Care, Second Edition.  World Science writer [facility]). Score between 0-7:  no or low risk or alcohol related problems. Score between 8-15:  moderate risk of alcohol related problems. Score between 16-19:  high risk of alcohol related problems. Score 20 or above:  warrants further diagnostic evaluation for alcohol dependence and treatment.   CLINICAL FACTORS:   Severe Anxiety and/or Agitation Bipolar Disorder:   Depressive phase Alcohol/Substance Abuse/Dependencies Unstable or Poor Therapeutic Relationship Previous Psychiatric Diagnoses and Treatments   Musculoskeletal: Strength & Muscle Tone: within normal limits Gait & Station: normal Patient leans: N/A  Psychiatric Specialty Exam:  Presentation  General Appearance: Fairly Groomed  Eye Contact:Minimal  Speech:Clear and Coherent  Speech Volume:Normal  Handedness:Right   Mood and Affect  Mood:Irritable  Affect:Congruent   Thought Process  Thought Processes:Coherent  Descriptions of Associations:Intact  Orientation:Full (Time, Place and Person)  Thought Content:Logical  History of Schizophrenia/Schizoaffective disorder:No  Duration of Psychotic Symptoms:No data recorded Hallucinations:Hallucinations: None  Ideas of Reference:None  Suicidal Thoughts:Suicidal Thoughts: Yes, Active SI Active Intent and/or Plan: Without Access to  Means  Homicidal Thoughts:Homicidal Thoughts: Yes, Passive HI Passive Intent and/or  Plan: Without Intent; Without Plan   Sensorium  Memory:Immediate Fair; Remote Fair; Recent Fair  Judgment:Impaired  Insight:Shallow   Executive Functions  Concentration:Fair  Attention Span:Fair  Recall:Fair  Fund of Knowledge:Fair  Language:Fair   Psychomotor Activity  Psychomotor Activity:Psychomotor Activity: Normal   Assets  Assets:Desire for Improvement; Physical Health; Resilience   Sleep  Sleep:Sleep: Fair Number of Hours of Sleep: 7.25    Physical Exam: Physical Exam ROS Blood pressure 131/86, pulse (!) 101, temperature 98 F (36.7 C), temperature source Oral, resp. rate 16, height 5\' 9"  (1.753 m), weight 59 kg, SpO2 97 %. Body mass index is 19.2 kg/m.   COGNITIVE FEATURES THAT CONTRIBUTE TO RISK:  Closed-mindedness    SUICIDE RISK:   Moderate:  Frequent suicidal ideation with limited intensity, and duration, some specificity in terms of plans, no associated intent, good self-control, limited dysphoria/symptomatology, some risk factors present, and identifiable protective factors, including available and accessible social support.  PLAN OF CARE: Continue inpatient admission, see H&P for full details   I certify that inpatient services furnished can reasonably be expected to improve the patient's condition.   , MD 03/26/2021, 11:22 AM

## 2021-03-26 NOTE — Progress Notes (Signed)
Patient appears restless and states "the trazodone made me feel sick." He did not sleep well last night . He ate all meals and requested double portions because "I need to get my appetite back." He is passively suicidal but verbally contracts for safety. He denies HI and AVH. Emotional support was given to patient. Patient remains safe on the unit. Will continue to monitor with q15 minute safety checks.

## 2021-03-26 NOTE — BHH Counselor (Addendum)
Patient declined to provide consent for Mental Health referrals at this time. Reports, "[facility] are a bunch of scum bags."    Signed:  [name], MSW, [place], LCASA 03/26/2021 1:57 PM

## 2021-03-26 NOTE — Progress Notes (Signed)
Recreation Therapy Notes  Date: 03/26/2021  Time: 9:30 am   Location: Courtyard     Behavioral response: N/A   Intervention Topic: Leisure   Discussion/Intervention: Patient did not attend group.   Clinical Observations/Feedback:  Patient did not attend group.   Nathan [name] LRT/CTRS        [name] 03/26/2021 10:41 AM

## 2021-03-26 NOTE — H&P (Addendum)
Psychiatric Admission Assessment Adult  Patient Identification: Nathan [name] MRN:  [id number] Date of Evaluation:  03/26/2021 Chief Complaint:  Bipolar depression (HCC) [F31.9] Principal Diagnosis: Bipolar depression (HCC) Diagnosis:  Principal Problem:   Bipolar depression (HCC) Active Problems:   Substance or medication-induced anxiety disorder (HCC)   Tobacco use disorder   Alcohol use disorder, severe, dependence (HCC)   Cannabis use disorder, moderate, in controlled environment (HCC)  CC "I need help."  History of Present Illness: 35 year old male with bipolar I disorder presenting with suicidal ideations with plan to shoot himself in the head. No acute events overnight, medication compliant, attending to [facility].   Patient seen one-on-one this morning. He notes that he had a terrible week. One of his friends had a heart attack and died, his girlfriend attempted to hang herself, and his mother was diagnosed with cancer. He quit his job and left [place] to return to [employer] and began drinking heavily again. He reports drinking 5-6 gallons of alcohol per day. He became increasingly depressed and took his friends gun and held it to his mouth. He was planning to kill himself, but friend wrestled the gun away. He then presented to the emergency room to get help. He notes he still has suicidal ideations with plan to shot himself, but is able to contract for safety in the hospital. He also notes some general desire to hurt other people without specific target or intent. He denies auditory and visual hallucinations. He states his main motivation to improve his mood and not kill himself is his 32-year-old son. He has had numerous medication trials in the past he did not feel were helpful. Discussed RBA of risperdal, lithium, and lamictal. He opts to try Lamictal at this time. He declines FDA approved medication to assist with alcohol cravings. He states he will not follow-up with any outpatient  providers, so would like to have inpatient treatment for substance abuse. However, he also adds he would decline referrals to [facility], [facility], [facility], [facility], or anywhere far away that would not provide transportation back home.   Associated Signs/Symptoms: Depression Symptoms:  depressed mood, hopelessness, suicidal thoughts with specific plan, anxiety, disturbed sleep, weight loss, decreased appetite, Duration of Depression Symptoms: Greater than two weeks  (Hypo) Manic Symptoms:  Impulsivity, Irritable Mood, Anxiety Symptoms:  Excessive Worry, Psychotic Symptoms:  Denies PTSD Symptoms: Negative Total Time spent with patient: 1 hour  Past Psychiatric History:  Patient has multiple presentations to the medical room as well as prior hospitlizations. He was been diagnosed with major depressive disorder and bipolar disorder in the past. Per patient he states he was diagnosed with intermittent explosive disorder and schizophrenia in the past. He has been tried on [place], Prozac, depakote, Vistaril, Seroquel, and Zoloft he felt made his symptoms worse. He did note that Thorazine seemed to be somewhat helpful, but was too sedating during the day. He has a history of multiple suicide attempts.   Is the patient at risk to self? Yes.    Has the patient been a risk to self in the past 6 months? Yes.    Has the patient been a risk to self within the distant past? Yes.    Is the patient a risk to others? Yes.    Has the patient been a risk to others in the past 6 months? No.  Has the patient been a risk to others within the distant past? No.   Prior Inpatient Therapy:   Prior Outpatient Therapy:  Alcohol Screening: 1. How often do you have a drink containing alcohol?: 4 or more times a week 2. How many drinks containing alcohol do you have on a typical day when you are drinking?: 10 or more 3. How often do you have six or more drinks on one occasion?: Daily or almost  daily AUDIT-C Score: 12 4. How often during the last year have you found that you were not able to stop drinking once you had started?: Daily or almost daily 5. How often during the last year have you failed to do what was normally expected from you because of drinking?: Daily or almost daily 6. How often during the last year have you needed a first drink in the morning to get yourself going after a heavy drinking session?: Daily or almost daily 7. How often during the last year have you had a feeling of guilt of remorse after drinking?: Daily or almost daily 8. How often during the last year have you been unable to remember what happened the night before because you had been drinking?: Daily or almost daily 9. Have you or someone else been injured as a result of your drinking?: Yes, during the last year 10. Has a relative or friend or a doctor or another health worker been concerned about your drinking or suggested you cut down?: Yes, during the last year Alcohol Use Disorder Identification Test Final Score (AUDIT): 40 Alcohol Brief Interventions/Follow-up: Alcohol education/Brief advice Substance Abuse History in the last 12 months:  Yes.   Consequences of Substance Abuse: Elevated LFTS, withdrawal symptoms, increased anger, job loss Previous Psychotropic Medications: Yes  Psychological Evaluations: Yes  Past Medical History:  Past Medical History:  Diagnosis Date  . Anxiety   . Bipolar 1 disorder (HCC)   . Chronic headache   . Depression 03/30/2020  . ETOH abuse 03/30/2020  . 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 Heroin addiction (HCC)   . Horseshoe kidney   . Marijuana smoker 03/30/2020    Past Surgical History:  Procedure Laterality Date  . TONSILLECTOMY     Family History:  Family History  Problem Relation Age of Onset  . Hypertension Mother   . Diabetes Mother   . Hypertension Father   . Heart failure Father   . Diabetes Father    Family Psychiatric  History: Notes that  his father has schizophrenia, and his mother has multiple suicide attempts, "everyone" with substance use Tobacco Screening: Have you used any form of tobacco in the last 30 days? (Cigarettes, Smokeless Tobacco, Cigars, and/or Pipes): Yes Tobacco use, Select all that apply: 5 or more cigarettes per day Are you interested in Tobacco Cessation Medications?: No, patient refused Counseled patient on smoking cessation including recognizing danger situations, developing coping skills and basic information about quitting provided: Yes Social History:  Social History   Substance and Sexual Activity  Alcohol Use Yes  . Alcohol/week: 24.0 standard drinks  . Types: 24 Cans of beer per week   Comment: 4 gallons a day- pt reports more than this currently     Social History   Substance and Sexual Activity  Drug Use Not Currently  . Types: Marijuana, Cocaine   Comment: Heroin addiction- denies current drug use    Additional Social History:                           Allergies:   Allergies  Allergen Reactions  . Geodon [Ziprasidone  Hcl] Other (See Comments)    Seizures  . Onion Anaphylaxis  . Valproic Acid   . Levofloxacin Itching   Lab Results:  Results for orders placed or performed during the hospital encounter of 03/24/21 (from the past 48 hour(s))  Comprehensive metabolic panel     Status: Abnormal   Collection Time: 03/24/21 10:47 PM  Result Value Ref Range   Sodium 131 (L) 135 - 145 mmol/L   Potassium 3.3 (L) 3.5 - 5.1 mmol/L   Chloride 94 (L) 98 - 111 mmol/L   CO2 22 22 - 32 mmol/L   Glucose, Bld 162 (H) 70 - 99 mg/dL    Comment: Glucose reference range applies only to samples taken after fasting for at least 8 hours.   BUN 7 6 - 20 mg/dL   Creatinine, Ser 1.61 (L) 0.61 - 1.24 mg/dL   Calcium 9.3 8.9 - 09.6 mg/dL   Total Protein 8.2 (H) 6.5 - 8.1 g/dL   Albumin 4.6 3.5 - 5.0 g/dL   AST 73 (H) 15 - 41 U/L   ALT 56 (H) 0 - 44 U/L   Alkaline Phosphatase 92 38 - 126  U/L   Total Bilirubin 1.2 0.3 - 1.2 mg/dL   GFR, Estimated >04 >54 mL/min    Comment: (NOTE) Calculated using the CKD-EPI Creatinine Equation (2021)    Anion gap 15 5 - 15    Comment: Performed at [facility], [address] [place], [place] [address]  Ethanol     Status: Abnormal   Collection Time: 03/24/21 10:47 PM  Result Value Ref Range   Alcohol, Ethyl (B) 221 (H) <10 mg/dL    Comment: (NOTE) Lowest detectable limit for serum alcohol is 10 mg/dL.  For medical purposes only. Performed at [facility], [address] [place], [place] [address]   cbc     Status: Abnormal   Collection Time: 03/24/21 10:47 PM  Result Value Ref Range   WBC 11.9 (H) 4.0 - 10.5 K/uL   RBC 4.89 4.22 - 5.81 MIL/uL   Hemoglobin 16.3 13.0 - 17.0 g/dL   HCT 29.5 62.1 - 30.8 %   MCV 93.9 80.0 - 100.0 fL   MCH 33.3 26.0 - 34.0 pg   MCHC 35.5 30.0 - 36.0 g/dL   RDW 65.7 84.6 - 96.2 %   Platelets 246 150 - 400 K/uL   nRBC 0.0 0.0 - 0.2 %    Comment: Performed at [facility], [address] [place], [place] [address]  Urine Drug Screen, Qualitative     Status: Abnormal   Collection Time: 03/24/21 10:47 PM  Result Value Ref Range   Tricyclic, Ur Screen NONE DETECTED NONE DETECTED   Amphetamines, Ur Screen NONE DETECTED NONE DETECTED   MDMA (Ecstasy)Ur Screen NONE DETECTED NONE DETECTED   Cocaine Metabolite,Ur [place] POSITIVE (A) NONE DETECTED   Opiate, Ur Screen NONE DETECTED NONE DETECTED   Phencyclidine (PCP) Ur S NONE DETECTED NONE DETECTED   Cannabinoid 50 Ng, Ur [place] POSITIVE (A) NONE DETECTED   Barbiturates, Ur Screen NONE DETECTED NONE DETECTED   Benzodiazepine, Ur Scrn NONE DETECTED NONE DETECTED   Methadone Scn, Ur NONE DETECTED NONE DETECTED    Comment: (NOTE) Tricyclics + metabolites, urine    Cutoff 1000 ng/mL Amphetamines + metabolites, urine  Cutoff 1000 ng/mL MDMA (Ecstasy), urine              Cutoff 500 ng/mL Cocaine Metabolite, urine  Cutoff  300 ng/mL Opiate + metabolites, urine        Cutoff 300 ng/mL Phencyclidine (PCP), urine         Cutoff 25 ng/mL Cannabinoid, urine                 Cutoff 50 ng/mL Barbiturates + metabolites, urine  Cutoff 200 ng/mL Benzodiazepine, urine              Cutoff 200 ng/mL Methadone, urine                   Cutoff 300 ng/mL  The urine drug screen provides only a preliminary, unconfirmed analytical test result and should not be used for non-medical purposes. Clinical consideration and professional judgment should be applied to any positive drug screen result due to possible interfering substances. A more specific alternate chemical method must be used in order to obtain a confirmed analytical result. Gas chromatography / mass spectrometry (GC/MS) is the preferred confirm atory method. Performed at [facility], [address] [place], [place] [address]   Resp Panel by RT-PCR (Flu A&B, Covid) Nasopharyngeal Swab     Status: None   Collection Time: 03/24/21 11:54 PM   Specimen: Nasopharyngeal Swab; Nasopharyngeal(NP) swabs in vial transport medium  Result Value Ref Range   SARS Coronavirus 2 by RT PCR NEGATIVE NEGATIVE    Comment: (NOTE) SARS-CoV-2 target nucleic acids are NOT DETECTED.  The SARS-CoV-2 RNA is generally detectable in upper respiratory specimens during the acute phase of infection. The lowest concentration of SARS-CoV-2 viral copies this assay can detect is 138 copies/mL. A negative result does not preclude SARS-Cov-2 infection and should not be used as the sole basis for treatment or other patient management decisions. A negative result may occur with  improper specimen collection/handling, submission of specimen other than nasopharyngeal swab, presence of viral mutation(s) within the areas targeted by this assay, and inadequate number of viral copies(<138 copies/mL). A negative result must be combined with clinical observations, patient history, and  epidemiological information. The expected result is Negative.  Fact Sheet for Patients:  BloggerCourse.com  Fact Sheet for Healthcare Providers:  SeriousBroker.it  This test is no t yet approved or cleared by the [place] FDA and  has been authorized for detection and/or diagnosis of SARS-CoV-2 by FDA under an Emergency Use Authorization (EUA). This EUA will remain  in effect (meaning this test can be used) for the duration of the COVID-19 declaration under Section 564(b)(1) of the Act, 21 U.S.C.section 360bbb-3(b)(1), unless the authorization is terminated  or revoked sooner.       Influenza A by PCR NEGATIVE NEGATIVE   Influenza B by PCR NEGATIVE NEGATIVE    Comment: (NOTE) The Xpert Xpress SARS-CoV-2/FLU/RSV plus assay is intended as an aid in the diagnosis of influenza from Nasopharyngeal swab specimens and should not be used as a sole basis for treatment. Nasal washings and aspirates are unacceptable for Xpert Xpress SARS-CoV-2/FLU/RSV testing.  Fact Sheet for Patients: BloggerCourse.com  Fact Sheet for Healthcare Providers: SeriousBroker.it  This test is not yet approved or cleared by the [place] FDA and has been authorized for detection and/or diagnosis of SARS-CoV-2 by FDA under an Emergency Use Authorization (EUA). This EUA will remain in effect (meaning this test can be used) for the duration of the COVID-19 declaration under Section 564(b)(1) of the Act, 21 U.S.C. section 360bbb-3(b)(1), unless the authorization is terminated or revoked.  Performed at [facility], [address]  [place], [place] [address]     Blood Alcohol level:  Lab Results  Component Value Date   ETH 221 (H) 03/24/2021   ETH <10 02/03/2021    Metabolic Disorder Labs:  Lab Results  Component Value Date   HGBA1C 5.2 02/06/2021   MPG 103 02/06/2021   Lab  Results  Component Value Date   PROLACTIN 7.0 04/23/2016   Lab Results  Component Value Date   CHOL 139 02/06/2021   TRIG 55 02/06/2021   HDL 55 02/06/2021   CHOLHDL 2.5 02/06/2021   VLDL 11 02/06/2021   LDLCALC 73 02/06/2021   LDLCALC 105 (H) 04/23/2016    Current Medications: Current Facility-Administered Medications  Medication Dose Route Frequency Provider Last Rate Last Admin  . acetaminophen (TYLENOL) tablet 650 mg  650 mg Oral Q6H PRN Clapacs, Jackquline [place], MD   650 mg at 03/26/21 1107  . alum & mag hydroxide-simeth (MAALOX/MYLANTA) 200-200-20 MG/5ML suspension 30 mL  30 mL Oral Q4H PRN Clapacs, John T, MD      . feeding supplement (ENSURE ENLIVE / ENSURE PLUS) liquid 237 mL  237 mL Oral TID BM Jesse Sans, MD      . folic acid (FOLVITE) tablet 1 mg  1 mg Oral Daily Clapacs, John T, MD   1 mg at 03/26/21 1105  . hydrOXYzine (ATARAX/VISTARIL) tablet 25 mg  25 mg Oral TID PRN Jesse Sans, MD      . lamoTRIgine (LAMICTAL) tablet 25 mg  25 mg Oral Daily Jesse Sans, MD   25 mg at 03/26/21 1106  . LORazepam (ATIVAN) tablet 1-4 mg  1-4 mg Oral Q1H PRN Clapacs, Jackquline [place], MD       Or  . LORazepam (ATIVAN) injection 1-4 mg  1-4 mg Intravenous Q1H PRN Clapacs, John T, MD      . magnesium hydroxide (MILK OF MAGNESIA) suspension 30 mL  30 mL Oral Daily PRN Clapacs, John T, MD      . multivitamin with minerals tablet 1 tablet  1 tablet Oral Daily Clapacs, Jackquline [place], MD   1 tablet at 03/26/21 1105  . nicotine polacrilex (NICORETTE) gum 2 mg  2 mg Oral Q4H PRN Jesse Sans, MD   2 mg at 03/26/21 1106  . thiamine tablet 100 mg  100 mg Oral Daily Clapacs, John T, MD   100 mg at 03/26/21 1105   Or  . thiamine (B-1) injection 100 mg  100 mg Intravenous Daily Clapacs, John T, MD      . traZODone (DESYREL) tablet 100 mg  100 mg Oral QHS PRN Clapacs, Jackquline [place], MD   100 mg at 03/25/21 2115   PTA Medications: Medications Prior to Admission  Medication Sig Dispense Refill Last Dose  .  traZODone (DESYREL) 100 MG tablet Take 1 tablet (100 mg total) by mouth at bedtime as needed for sleep. (Patient not taking: Reported on 03/24/2021) 30 tablet 1     Musculoskeletal: Strength & Muscle Tone: within normal limits Gait & Station: normal Patient leans: N/A            Psychiatric Specialty Exam:  Presentation  General Appearance: Fairly Groomed  Eye Contact:Minimal  Speech:Clear and Coherent  Speech Volume:Normal  Handedness:Right   Mood and Affect  Mood:Irritable  Affect:Congruent   Thought Process  Thought Processes:Coherent  Duration of Psychotic Symptoms: No data recorded Past Diagnosis of Schizophrenia or Psychoactive disorder: No  Descriptions of Associations:Intact  Orientation:Full (Time, Place and Person)  Thought  Content:Logical  Hallucinations:Hallucinations: None  Ideas of Reference:None  Suicidal Thoughts:Suicidal Thoughts: Yes, Active SI Active Intent and/or Plan: Without Access to Means  Homicidal Thoughts:Homicidal Thoughts: Yes, Passive HI Passive Intent and/or Plan: Without Intent; Without Plan   Sensorium  Memory:Immediate Fair; Remote Fair; Recent Fair  Judgment:Impaired  Insight:Shallow   Executive Functions  Concentration:Fair  Attention Span:Fair  Recall:Fair  Fund of Knowledge:Fair  Language:Fair   Psychomotor Activity  Psychomotor Activity:Psychomotor Activity: Normal   Assets  Assets:Desire for Improvement; Physical Health; Resilience   Sleep  Sleep:Sleep: Fair Number of Hours of Sleep: 7.25    Physical Exam: Physical Exam Vitals and nursing note reviewed.  Constitutional:      Appearance: Normal appearance.  HENT:     Head: Normocephalic and atraumatic.     Right Ear: External ear normal.     Left Ear: External ear normal.     Nose: Nose normal.     Mouth/Throat:     Mouth: Mucous membranes are moist.     Pharynx: Oropharynx is clear.  Eyes:     Conjunctiva/sclera:  Conjunctivae normal.     Pupils: Pupils are equal, round, and reactive to light.  Cardiovascular:     Rate and Rhythm: Normal rate.     Pulses: Normal pulses.  Pulmonary:     Effort: Pulmonary effort is normal.     Breath sounds: Normal breath sounds.  Abdominal:     General: Abdomen is flat.     Palpations: Abdomen is soft.  Musculoskeletal:        General: No swelling. Normal range of motion.     Cervical back: Normal range of motion and neck supple.  Skin:    General: Skin is warm and dry.  Neurological:     General: No focal deficit present.     Mental Status: He is alert and oriented to person, place, and time.  Psychiatric:        Attention and Perception: Attention and perception normal.        Mood and Affect: Mood is depressed. Affect is angry.        Speech: Speech normal.        Behavior: Behavior is agitated.        Thought Content: Thought content includes homicidal and suicidal ideation. Thought content includes suicidal plan. Thought content does not include homicidal plan.        Cognition and Memory: Cognition and memory normal.        Judgment: Judgment is impulsive.    Review of Systems  Constitutional: Positive for malaise/fatigue and weight loss.  HENT: Negative for congestion and sore throat.   Eyes: Negative for discharge and redness.  Respiratory: Negative for cough and shortness of breath.   Cardiovascular: Negative for chest pain and palpitations.  Gastrointestinal: Positive for nausea. Negative for abdominal pain, constipation, diarrhea and vomiting.  Genitourinary: Negative for dysuria and urgency.  Musculoskeletal: Positive for myalgias. Negative for neck pain.  Skin: Negative for itching and rash.  Neurological: Positive for headaches. Negative for dizziness.  Endo/Heme/Allergies: Positive for environmental allergies. Negative for polydipsia.  Psychiatric/Behavioral: Positive for depression, substance abuse and suicidal ideas.   Blood pressure  131/86, pulse (!) 101, temperature 98 F (36.7 C), temperature source Oral, resp. rate 16, height  (1.753 m), weight 59 kg, SpO2 97 %. Body mass index is 19.2 kg/m.  Treatment Plan Summary: Daily contact with patient to assess and evaluate symptoms and progress in treatment and Medication management  1)Bipolar I Disorder, mixed, severe vs MDD, recurrent, severe- established problem, unstable - Patient reporting suicidal ideations with plan to shoot himself in the head with his friend's gun. Feels his symptoms worsened with Geodon, Latuda, Prozac, Depakote, Vistaril, Seroquel, Abilify, and Zoloft  - Start Lamictal 25 mg daily, titrate biweekly as tolerated with target dose of 200 mg daily - Patient was noted to improve on Risperdal in his hospital stay 2019, but patient declines to resume this medication as he felt it was not helpful. Also declines to start Lithium   2) Alcohol Use Disorder, severe- established problem, unstable - CIWA protocol - MVI, folate, thiamine - Patient requesting detox resources  - Declines Naltrexone or Acamprosate at this time, will continue to encourage during his stay   3) Tobacco Use Disorder- established problem - Nicorette gum   4)Cannabis Use Disorder- established problem - Cessation counseling Observation Level/Precautions:  Detox  Laboratory:  Completed in ED  Psychotherapy:    Medications:    Consultations:    Discharge Concerns:    Estimated LOS:  Other:     Physician Treatment Plan for Primary Diagnosis: Bipolar depression (HCC) Long Term Goal(s): Improvement in symptoms so as ready for discharge  Short Term Goals: Ability to identify changes in lifestyle to reduce recurrence of condition will improve, Ability to verbalize feelings will improve, Ability to disclose and discuss suicidal ideas, Ability to demonstrate self-control will improve, Ability to identify and develop effective coping behaviors will improve, Ability to maintain  clinical measurements within normal limits will improve, Compliance with prescribed medications will improve and Ability to identify triggers associated with substance abuse/mental health issues will improve  Physician Treatment Plan for Secondary Diagnosis: Principal Problem:   Bipolar depression (HCC) Active Problems:   Substance or medication-induced anxiety disorder (HCC)   Tobacco use disorder   Alcohol use disorder, severe, dependence (HCC)   Cannabis use disorder, moderate, in controlled environment (HCC)  Long Term Goal(s): Improvement in symptoms so as ready for discharge  Short Term Goals: Ability to identify changes in lifestyle to reduce recurrence of condition will improve, Ability to verbalize feelings will improve, Ability to disclose and discuss suicidal ideas, Ability to demonstrate self-control will improve, Ability to identify and develop effective coping behaviors will improve, Ability to maintain clinical measurements within normal limits will improve, Compliance with prescribed medications will improve and Ability to identify triggers associated with substance abuse/mental health issues will improve  I certify that inpatient services furnished can reasonably be expected to improve the patient's condition.    Jesse Sans, MD 4/7/202211:22 AM
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
## 2021-03-26 NOTE — [facility] Suicide Risk Assessment (Signed)
[facility] INPATIENT:  Family/Significant Other Suicide Prevention Education  Suicide Prevention Education:  Contact Attempts: Nathan [name], (Mother) has been identified by the patient as the family member/significant other with whom the patient will be residing, and identified as the person(s) who will aid the patient in the event of a mental health crisis.  With written consent from the patient, two attempts were made to provide suicide prevention education, prior to and/or following the patient's discharge.  We were unsuccessful in providing suicide prevention education.  A suicide education pamphlet was given to the patient to share with family/significant other.  Date and time of first attempt: 03/26/21 @ 1434 Date and time of second attempt:CSW will make additional attempts to reach collateral contact.   [name] 03/26/2021, 2:34 PM

## 2021-03-26 NOTE — [facility] Group Notes (Signed)
LCSW Group Therapy Note     03/26/2021 1:47 PM     Type of Therapy/Topic:  Group Therapy:  Balance in Life     Participation Level:  Did Not Attend     Description of Group:    This group will address the concept of balance and how it feels and looks when one is unbalanced. Patients will be encouraged to process areas in their lives that are out of balance and identify reasons for remaining unbalanced. Facilitators will guide patients in utilizing problem-solving interventions to address and correct the stressor making their life unbalanced. Understanding and applying boundaries will be explored and addressed for obtaining and maintaining a balanced life. Patients will be encouraged to explore ways to assertively make their unbalanced needs known to significant others in their lives, using other group members and facilitator for support and feedback.     Therapeutic Goals:  1.      Patient will identify two or more emotions or situations they have that consume much of in their lives.  2.      Patient will identify signs/triggers that life has become out of balance:  3.      Patient will identify two ways to set boundaries in order to achieve balance in their lives:  4.      Patient will demonstrate ability to communicate their needs through discussion and/or role plays     Summary of Patient Progress: X      Therapeutic Modalities:   Cognitive Behavioral Therapy  Solution-Focused Therapy  Assertiveness Training     Dyanara Cozza [place] MSW, LCSW-A  03/26/2021 1:47 PM

## 2021-03-27 MED ORDER — LOPERAMIDE HCL 2 MG PO CAPS
2.0000 mg | ORAL_CAPSULE | ORAL | Status: DC | PRN
  Administered 2021-03-28: 2 mg via ORAL
  Filled 2021-03-27: qty 1

## 2021-03-27 NOTE — Progress Notes (Signed)
Recreation Therapy Notes  INPATIENT RECREATION THERAPY ASSESSMENT  Patient Details Name: Nathan [name] MRN: [id number] DOB: 29-Jun-1986 Today's Date: 03/27/2021       Information Obtained From: Patient  Able to Participate in Assessment/Interview: Yes  Patient Presentation: Responsive  Reason for Admission (Per Patient): Active Symptoms,Suicidal Ideation  Patient Stressors: Death  Coping Skills:   Substance Abuse,Talk,TV  Leisure Interests (2+):  Individual - TV,Social - Penelope Galas - Fishing  Frequency of Recreation/Participation: Monthly  Awareness of Community Resources:  Yes  Community Resources:  Library  Current Use: No  If no, Barriers?: Transportation,Financial  Expressed Interest in [employer] Information: Yes  [place]  Patient Main Form of Transportation: Walk  Patient Strengths:  Talking to others, Respectful  Patient Identified Areas of Improvement:  Get my mind right  Patient Goal for Hospitalization:  Get medication and head leveled  Current SI (including self-harm):  Yes (No plan)  Current HI:  No  Current AVH: No  Staff Intervention Plan: Group Attendance,Collaborate with Interdisciplinary Treatment Team  Consent to Intern Participation: N/A  Quantina Dershem 03/27/2021, 10:17 AM

## 2021-03-27 NOTE — Progress Notes (Signed)
Pt has been cooperative and med compliant. Pt has an agitated, anxious affect at times but sociat  Pt has had PRNs in accordance with his CIWA scale. Torrie Mayers RN

## 2021-03-27 NOTE — [facility] Group Notes (Signed)
LCSW Group Therapy Note  03/27/2021 1:52 PM  Type of Therapy and Topic:  Group Therapy:  Feelings around Relapse and Recovery  Participation Level:  Did Not Attend   Description of Group:    Patients in this group will discuss emotions they experience before and after a relapse. They will process how experiencing these feelings, or avoidance of experiencing them, relates to having a relapse. Facilitator will guide patients to explore emotions they have related to recovery. Patients will be encouraged to process which emotions are more powerful. They will be guided to discuss the emotional reaction significant others in their lives may have to their relapse or recovery. Patients will be assisted in exploring ways to respond to the emotions of others without this contributing to a relapse.  Therapeutic Goals: 1. Patient will identify two or more emotions that lead to a relapse for them 2. Patient will identify two emotions that result when they relapse 3. Patient will identify two emotions related to recovery 4. Patient will demonstrate ability to communicate their needs through discussion and/or role plays   Summary of Patient Progress: Patient did not attend group despite encouraged participation.     Therapeutic Modalities:   Cognitive Behavioral Therapy Solution-Focused Therapy Assertiveness Training Relapse Prevention Therapy   Nathan [name], MSW, LCSWA, LCASA 03/27/2021 1:52 PM  

## 2021-03-27 NOTE — Plan of Care (Addendum)
Pt rates depression, anxiety and hopelessness all at 10/10. Pt has passive SI with no plan but contracts for safety. Pt denies HI and AVH. Torrie Mayers RN Problem: Education: Goal: Knowledge of [facility] Education information/materials will improve Outcome: Progressing Goal: Emotional status will improve Outcome: Progressing Goal: Mental status will improve Outcome: Progressing Goal: Verbalization of understanding the information provided will improve Outcome: Progressing   Problem: Education: Goal: Ability to make informed decisions regarding treatment will improve Outcome: Progressing   Problem: Coping: Goal: Coping ability will improve Outcome: Progressing   Problem: Health Behavior/Discharge Planning: Goal: Identification of resources available to assist in meeting health care needs will improve Outcome: Progressing   Problem: Medication: Goal: Compliance with prescribed medication regimen will improve Outcome: Progressing   Problem: Self-Concept: Goal: Ability to disclose and discuss suicidal ideas will improve Outcome: Progressing Goal: Will verbalize positive feelings about self Outcome: Progressing   Problem: Education: Goal: Knowledge of disease or condition will improve Outcome: Progressing Goal: Understanding of discharge needs will improve Outcome: Progressing   Problem: Health Behavior/Discharge Planning: Goal: Ability to identify changes in lifestyle to reduce recurrence of condition will improve Outcome: Progressing Goal: Identification of resources available to assist in meeting health care needs will improve Outcome: Progressing   Problem: Physical Regulation: Goal: Complications related to the disease process, condition or treatment will be avoided or minimized Outcome: Progressing   Problem: Safety: Goal: Ability to remain free from injury will improve Outcome: Progressing

## 2021-03-27 NOTE — Progress Notes (Signed)
[facility] MD Progress Note  03/27/2021 1:28 PM Nathan [name]  MRN:  [id number]  CC "Need someone to talk to."  Subjective:  35 year old male with bipolar I disorder presenting with suicidal ideations with plan to shoot himself in the head. No acute events overnight, medication compliant, attending to ADLS.   Patient seen during treatment team and again one-on-one. He notes that he feels the medication regimen is helping control his anxiety and anger. He denies any side effects to medication as well. He becomes tearful during treatment team as he discusses some recent life events. He does feel that having someone to speak to when he leaves would be helpful. He continues to have suicidal ideations, but contracts for safety. He denies any homicidal ideations, visual hallucinations, and auditory hallucinations.   Principal Problem: Bipolar depression (HCC) Diagnosis: Principal Problem:   Bipolar depression (HCC) Active Problems:   Substance or medication-induced anxiety disorder (HCC)   Tobacco use disorder   Alcohol use disorder, severe, dependence (HCC)   Cannabis use disorder, moderate, in controlled environment (HCC)  Total Time spent with patient: 30 minutes  Past Psychiatric History: See H&P  Past Medical History:  Past Medical History:  Diagnosis Date  . Anxiety   . Bipolar 1 disorder (HCC)   . Chronic headache   . Depression 03/30/2020  . ETOH abuse 03/30/2020  . 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 Heroin addiction (HCC)   . Horseshoe kidney   . Marijuana smoker 03/30/2020    Past Surgical History:  Procedure Laterality Date  . TONSILLECTOMY     Family History:  Family History  Problem Relation Age of Onset  . Hypertension Mother   . Diabetes Mother   . Hypertension Father   . Heart failure Father   . Diabetes Father    Family Psychiatric  History: See H&P Social History:  Social History   Substance and Sexual Activity  Alcohol Use Yes  . Alcohol/week: 24.0  standard drinks  . Types: 24 Cans of beer per week   Comment: 4 gallons a day- pt reports more than this currently     Social History   Substance and Sexual Activity  Drug Use Not Currently  . Types: Marijuana, Cocaine   Comment: Heroin addiction- denies current drug use    Social History   Socioeconomic History  . Marital status: Single    Spouse name: Not on file  . Number of children: 0  . Years of education: 44  . Highest education level: Not on file  Occupational History  . Not on file  Tobacco Use  . Smoking status: Current Every Day Smoker    Packs/day: 1.00    Types: Cigarettes  . Smokeless tobacco: Current User    Types: Chew  Vaping Use  . Vaping Use: Never used  Substance and Sexual Activity  . Alcohol use: Yes    Alcohol/week: 24.0 standard drinks    Types: 24 Cans of beer per week    Comment: 4 gallons a day- pt reports more than this currently  . Drug use: Not Currently    Types: Marijuana, Cocaine    Comment: Heroin addiction- denies current drug use  . Sexual activity: Not Currently  Other Topics Concern  . Not on file  Social History Narrative  . Not on file   Social Determinants of Health   Financial Resource Strain: Not on file  Food Insecurity: Not on file  Transportation Needs: Not on file  Physical  Activity: Not on file  Stress: Not on file  Social Connections: Not on file   Additional Social History:                         Sleep: Fair  Appetite:  Good  Current Medications: Current Facility-Administered Medications  Medication Dose Route Frequency Provider Last Rate Last Admin  . acetaminophen (TYLENOL) tablet 650 mg  650 mg Oral Q6H PRN Clapacs, Jackquline Denmark, MD   650 mg at 03/27/21 0644  . alum & mag hydroxide-simeth (MAALOX/MYLANTA) 200-200-20 MG/5ML suspension 30 mL  30 mL Oral Q4H PRN Clapacs, John T, MD      . feeding supplement (ENSURE ENLIVE / ENSURE PLUS) liquid 237 mL  237 mL Oral TID BM Jesse Sans, MD   237 mL  at 03/27/21 1215  . folic acid (FOLVITE) tablet 1 mg  1 mg Oral Daily Clapacs, John T, MD   1 mg at 03/27/21 0826  . hydrOXYzine (ATARAX/VISTARIL) tablet 25 mg  25 mg Oral TID PRN Jesse Sans, MD   25 mg at 03/26/21 1855  . lamoTRIgine (LAMICTAL) tablet 25 mg  25 mg Oral Daily Jesse Sans, MD   25 mg at 03/27/21 0829  . LORazepam (ATIVAN) tablet 1-4 mg  1-4 mg Oral Q1H PRN Clapacs, Jackquline Denmark, MD   1 mg at 03/27/21 1213   Or  . LORazepam (ATIVAN) injection 1-4 mg  1-4 mg Intravenous Q1H PRN Clapacs, John T, MD      . magnesium hydroxide (MILK OF MAGNESIA) suspension 30 mL  30 mL Oral Daily PRN Clapacs, John T, MD      . multivitamin with minerals tablet 1 tablet  1 tablet Oral Daily Clapacs, Jackquline Denmark, MD   1 tablet at 03/27/21 0827  . nicotine polacrilex (NICORETTE) gum 2 mg  2 mg Oral Q4H PRN Jesse Sans, MD   2 mg at 03/26/21 1656  . thiamine tablet 100 mg  100 mg Oral Daily Clapacs, John T, MD   100 mg at 03/27/21 0827   Or  . thiamine (B-1) injection 100 mg  100 mg Intravenous Daily Clapacs, John T, MD      . traZODone (DESYREL) tablet 100 mg  100 mg Oral QHS PRN Clapacs, Jackquline Denmark, MD   100 mg at 03/25/21 2115    Lab Results: No results found for this or any previous visit (from the past 48 hour(s)).  Blood Alcohol level:  Lab Results  Component Value Date   ETH 221 (H) 03/24/2021   ETH <10 02/03/2021    Metabolic Disorder Labs: Lab Results  Component Value Date   HGBA1C 5.2 02/06/2021   MPG 103 02/06/2021   Lab Results  Component Value Date   PROLACTIN 7.0 04/23/2016   Lab Results  Component Value Date   CHOL 139 02/06/2021   TRIG 55 02/06/2021   HDL 55 02/06/2021   CHOLHDL 2.5 02/06/2021   VLDL 11 02/06/2021   LDLCALC 73 02/06/2021   LDLCALC 105 (H) 04/23/2016    Physical Findings: AIMS:  , ,  ,  ,    CIWA:  CIWA-Ar Total: 1 COWS:     Musculoskeletal: Strength & Muscle Tone: within normal limits Gait & Station: normal Patient leans:  N/A  Psychiatric Specialty Exam:  Presentation  General Appearance: Fairly Groomed  Eye Contact:Fair  Speech:Clear and Coherent  Speech Volume:Normal  Handedness:Right   Mood and Affect  Mood:Irritable  Affect:Congruent   Thought Process  Thought Processes:Coherent  Descriptions of Associations:Intact  Orientation:Full (Time, Place and Person)  Thought Content:Logical  History of Schizophrenia/Schizoaffective disorder:No  Duration of Psychotic Symptoms:No data recorded Hallucinations:Hallucinations: None  Ideas of Reference:None  Suicidal Thoughts:Suicidal Thoughts: Yes, Active SI Active Intent and/or Plan: Without Access to Means  Homicidal Thoughts:Homicidal Thoughts: No HI Passive Intent and/or Plan: Without Intent; Without Plan   Sensorium  Memory:Immediate Fair; Remote Fair; Recent Fair  Judgment:Impaired  Insight:Shallow   Executive Functions  Concentration:Fair  Attention Span:Fair  Recall:Fair  Fund of Knowledge:Fair  Language:Fair   Psychomotor Activity  Psychomotor Activity:Psychomotor Activity: Normal   Assets  Assets:Desire for Improvement; Physical Health; Resilience   Sleep  Sleep:Sleep: Fair Number of Hours of Sleep: 6    Physical Exam: Physical Exam ROS Blood pressure 129/82, pulse 83, temperature 97.8 F (36.6 C), temperature source Oral, resp. rate 18, height 5\' 9"  (1.753 m), weight 59 kg, SpO2 97 %. Body mass index is 19.2 kg/m.   Treatment Plan Summary: Daily contact with patient to assess and evaluate symptoms and progress in treatment and Medication management  1)Bipolar I Disorder, mixed, severe vs MDD, recurrent, severe- established problem, unstable - Patient reporting suicidal ideations with plan to shoot himself in the head with his friend's gun. Feels his symptoms worsened with Geodon, Latuda, Prozac, Depakote, Vistaril, Seroquel, Abilify, and Zoloft  - Continue Lamictal 25 mg daily, titrate  biweekly as tolerated with target dose of 200 mg daily - Patient continues to decline starting an atypical antipsychotic or additional mood stabilizer while waiting for lamictal to become therapeutic   2) Alcohol Use Disorder, severe- established problem, unstable - CIWA protocol - MVI, folate, thiamine - Patient requesting outpatient detox resources  - Declines Naltrexone or Acamprosate at this time, will continue to encourage during his stay   3) Tobacco Use Disorder- established problem - Nicorette gum   4)Cannabis Use Disorder- established problem - Cessation counseling , MD 03/27/2021, 1:28 PM

## 2021-03-27 NOTE — Progress Notes (Signed)
Patient presents with anxiety during assessment. Pt endorses passive SI but verbally contracts for safety. Pt compliant with medication administration per MD orders. Pt given education, support, and encouragement to be active in his treatment plan. Patient being monitored Q 15 minutes for safety per unit protocol. Pt remains safe on the unit.

## 2021-03-27 NOTE — Tx Team (Addendum)
Interdisciplinary Treatment and Diagnostic Plan Update  03/27/2021 Time of Session: 0900 Nathan [name] MRN: [id number]  Principal Diagnosis: Bipolar depression [facility])  Secondary Diagnoses: Principal Problem:   Bipolar depression (HCC) Active Problems:   Substance or medication-induced anxiety disorder (HCC)   Tobacco use disorder   Alcohol use disorder, severe, dependence (HCC)   Cannabis use disorder, moderate, in controlled environment (HCC)   Current Medications:  Current Facility-Administered Medications  Medication Dose Route Frequency Provider Last Rate Last Admin  . acetaminophen (TYLENOL) tablet 650 mg  650 mg Oral Q6H PRN Clapacs, Jackquline Denmark, MD   650 mg at 03/27/21 0644  . alum & mag hydroxide-simeth (MAALOX/MYLANTA) 200-200-20 MG/5ML suspension 30 mL  30 mL Oral Q4H PRN Clapacs, John T, MD      . feeding supplement (ENSURE ENLIVE / ENSURE PLUS) liquid 237 mL  237 mL Oral TID BM Jesse Sans, MD   237 mL at 03/26/21 2200  . folic acid (FOLVITE) tablet 1 mg  1 mg Oral Daily Clapacs, John T, MD   1 mg at 03/27/21 0826  . hydrOXYzine (ATARAX/VISTARIL) tablet 25 mg  25 mg Oral TID PRN Jesse Sans, MD   25 mg at 03/26/21 1855  . lamoTRIgine (LAMICTAL) tablet 25 mg  25 mg Oral Daily Jesse Sans, MD   25 mg at 03/27/21 0829  . LORazepam (ATIVAN) tablet 1-4 mg  1-4 mg Oral Q1H PRN Clapacs, Jackquline Denmark, MD   1 mg at 03/27/21 0865   Or  . LORazepam (ATIVAN) injection 1-4 mg  1-4 mg Intravenous Q1H PRN Clapacs, John T, MD      . magnesium hydroxide (MILK OF MAGNESIA) suspension 30 mL  30 mL Oral Daily PRN Clapacs, John T, MD      . multivitamin with minerals tablet 1 tablet  1 tablet Oral Daily Clapacs, Jackquline Denmark, MD   1 tablet at 03/27/21 0827  . nicotine polacrilex (NICORETTE) gum 2 mg  2 mg Oral Q4H PRN Jesse Sans, MD   2 mg at 03/26/21 1656  . thiamine tablet 100 mg  100 mg Oral Daily Clapacs, John T, MD   100 mg at 03/27/21 0827   Or  . thiamine (B-1) injection 100 mg   100 mg Intravenous Daily Clapacs, John T, MD      . traZODone (DESYREL) tablet 100 mg  100 mg Oral QHS PRN Clapacs, Jackquline Denmark, MD   100 mg at 03/25/21 2115   PTA Medications: Medications Prior to Admission  Medication Sig Dispense Refill Last Dose  . traZODone (DESYREL) 100 MG tablet Take 1 tablet (100 mg total) by mouth at bedtime as needed for sleep. (Patient not taking: Reported on 03/24/2021) 30 tablet 1     Patient Stressors:    Patient Strengths: Ability for insight Capable of independent living Communication skills  Treatment Modalities: Medication Management, Group therapy, Case management,  1 to 1 session with clinician, Psychoeducation, Recreational therapy.   Physician Treatment Plan for Primary Diagnosis: Bipolar depression (HCC) Long Term Goal(s): Improvement in symptoms so as ready for discharge Improvement in symptoms so as ready for discharge   Short Term Goals: Ability to identify changes in lifestyle to reduce recurrence of condition will improve Ability to verbalize feelings will improve Ability to disclose and discuss suicidal ideas Ability to demonstrate self-control will improve Ability to identify and develop effective coping behaviors will improve Ability to maintain clinical measurements within normal limits will improve Compliance with prescribed medications  will improve Ability to identify triggers associated with substance abuse/mental health issues will improve Ability to identify changes in lifestyle to reduce recurrence of condition will improve Ability to verbalize feelings will improve Ability to disclose and discuss suicidal ideas Ability to demonstrate self-control will improve Ability to identify and develop effective coping behaviors will improve Ability to maintain clinical measurements within normal limits will improve Compliance with prescribed medications will improve Ability to identify triggers associated with substance abuse/mental health  issues will improve  Medication Management: Evaluate patient's response, side effects, and tolerance of medication regimen.  Therapeutic Interventions: 1 to 1 sessions, Unit Group sessions and Medication administration.  Evaluation of Outcomes: Progressing  Physician Treatment Plan for Secondary Diagnosis: Principal Problem:   Bipolar depression (HCC) Active Problems:   Substance or medication-induced anxiety disorder (HCC)   Tobacco use disorder   Alcohol use disorder, severe, dependence (HCC)   Cannabis use disorder, moderate, in controlled environment (HCC)  Long Term Goal(s): Improvement in symptoms so as ready for discharge Improvement in symptoms so as ready for discharge   Short Term Goals: Ability to identify changes in lifestyle to reduce recurrence of condition will improve Ability to verbalize feelings will improve Ability to disclose and discuss suicidal ideas Ability to demonstrate self-control will improve Ability to identify and develop effective coping behaviors will improve Ability to maintain clinical measurements within normal limits will improve Compliance with prescribed medications will improve Ability to identify triggers associated with substance abuse/mental health issues will improve Ability to identify changes in lifestyle to reduce recurrence of condition will improve Ability to verbalize feelings will improve Ability to disclose and discuss suicidal ideas Ability to demonstrate self-control will improve Ability to identify and develop effective coping behaviors will improve Ability to maintain clinical measurements within normal limits will improve Compliance with prescribed medications will improve Ability to identify triggers associated with substance abuse/mental health issues will improve     Medication Management: Evaluate patient's response, side effects, and tolerance of medication regimen.  Therapeutic Interventions: 1 to 1 sessions, Unit  Group sessions and Medication administration.  Evaluation of Outcomes: Progressing   RN Treatment Plan for Primary Diagnosis: Bipolar depression (HCC) Long Term Goal(s): Knowledge of disease and therapeutic regimen to maintain health will improve  Short Term Goals: Ability to remain free from injury will improve, Ability to verbalize frustration and anger appropriately will improve, Ability to demonstrate self-control, Ability to participate in decision making will improve, Ability to verbalize feelings will improve, Ability to disclose and discuss suicidal ideas, Ability to identify and develop effective coping behaviors will improve and Compliance with prescribed medications will improve  Medication Management: RN will administer medications as ordered by provider, will assess and evaluate patient's response and provide education to patient for prescribed medication. RN will report any adverse and/or side effects to prescribing provider.  Therapeutic Interventions: 1 on 1 counseling sessions, Psychoeducation, Medication administration, Evaluate responses to treatment, Monitor vital signs and CBGs as ordered, Perform/monitor CIWA, COWS, AIMS and Fall Risk screenings as ordered, Perform wound care treatments as ordered.  Evaluation of Outcomes: Progressing   LCSW Treatment Plan for Primary Diagnosis: Bipolar depression (HCC) Long Term Goal(s): Safe transition to appropriate next level of care at discharge, Engage patient in therapeutic group addressing interpersonal concerns.  Short Term Goals: Engage patient in aftercare planning with referrals and resources, Increase social support, Increase ability to appropriately verbalize feelings, Increase emotional regulation, Facilitate acceptance of mental health diagnosis and concerns, Facilitate patient progression through stages  of change regarding substance use diagnoses and concerns, Identify triggers associated with mental health/substance abuse  issues and Increase skills for wellness and recovery  Therapeutic Interventions: Assess for all discharge needs, 1 to 1 time with Social worker, Explore available resources and support systems, Assess for adequacy in community support network, Educate family and significant other(s) on suicide prevention, Complete Psychosocial Assessment, Interpersonal group therapy.  Evaluation of Outcomes: Progressing   Progress in Treatment: Attending groups: No. Participating in groups: No. Taking medication as prescribed: Yes. Toleration medication: Yes. Family/Significant other contact made: Yes, individual(s) contacted:  Collateral obtained from patient's mother, Jarryn Altland  Patient understands diagnosis: Yes. Discussing patient identified problems/goals with staff: Yes. Medical problems stabilized or resolved: Yes. Denies suicidal/homicidal ideation: Yes. Issues/concerns per patient self-inventory: Yes. Other: none   New problem(s) identified: Yes, Describe:  expresses suicidal ideation and history of complications related to alcohol use disorder   New Short Term/Long Term Goal(s): detox, medication management for mood stabilization; elimination of SI thoughts; development of comprehensive mental wellness/sobriety plan.   Patient Goals:  patient reports "to be less depressed."  Discharge Plan or Barriers: patient is homeless, no other concerns at this time.   Reason for Continuation of Hospitalization: Aggression Anxiety Delusions  Depression Medication stabilization Suicidal ideation Withdrawal symptoms  Estimated Length of Stay: 1-7 days  Recreational Therapy: Patient Stressors: N/A Patient Goal: Patient will engage in groups without prompting or encouragement from LRT x3 group sessions within 5 recreation therapy group sessions   Attendees: Patient: Nathan [name] 03/27/2021 9:59 AM  Physician: Les Pou, MD 03/27/2021 9:59 AM  Nursing: Torrie Mayers, RN  03/27/2021  9:59 AM  RN Care Manager: 03/27/2021 9:59 AM  Social Worker: Gwenevere Ghazi, MSW, [place], LCASA  03/27/2021 9:59 AM  Recreational Therapist: Hilbert Bible, LRT  03/27/2021 9:59 AM  Other: Kiva [place], LCSWA 03/27/2021 9:59 AM  Other: Jillyn Hidden, MSW, LCSW, LCAS  03/27/2021 9:59 AM  Other: 03/27/2021 9:59 AM    Scribe for Treatment Team: [name], LCSWA 03/27/2021 9:59 AM

## 2021-03-27 NOTE — Progress Notes (Signed)
Recreation Therapy Notes  INPATIENT RECREATION TR PLAN  Patient Details Name: Nathan [name] MRN: [id number] DOB: 1986-06-15 Today's Date: 03/27/2021  Rec Therapy Plan Is patient appropriate for Therapeutic Recreation?: Yes Treatment times per week: at least 3 Estimated Length of Stay: 5-7 days TR Treatment/Interventions: Group participation (Comment)  Discharge Criteria Pt will be discharged from therapy if:: Discharged Treatment plan/goals/alternatives discussed and agreed upon by:: Patient/family  Discharge Summary     Leathie Weich 03/27/2021, 10:17 AM

## 2021-03-27 NOTE — Progress Notes (Addendum)
Recreation Therapy Notes    Date: 03/27/2021   Time: 2:00pm    Location: Courtyard     Behavioral response: N/A   Intervention Topic: Strengths    Discussion/Intervention: Patient did not attend group.   Clinical Observations/Feedback:  Patient did not attend group.   [name] LRT/CTRS        Nathan [name] 03/27/2021 2:26 PM 

## 2021-03-28 MED ORDER — ONDANSETRON HCL 4 MG PO TABS
8.0000 mg | ORAL_TABLET | Freq: Three times a day (TID) | ORAL | Status: DC | PRN
  Administered 2021-03-28: 8 mg via ORAL
  Filled 2021-03-28: qty 2

## 2021-03-28 MED ORDER — NALTREXONE HCL 50 MG PO TABS
25.0000 mg | ORAL_TABLET | Freq: Every day | ORAL | Status: DC
  Administered 2021-03-28 – 2021-03-29 (×2): 25 mg via ORAL
  Filled 2021-03-28 (×2): qty 1

## 2021-03-28 NOTE — Progress Notes (Signed)
Patient has been pleasant and cooperative. Admits to still feeling suicidal but contracts for safety. Received Ativan twice for withdrawal symptoms and then slept the rest of the night

## 2021-03-28 NOTE — Progress Notes (Addendum)
Pt is alert and oriented to person, place, time and situation. Pt is calm, cooperative, pleasant, spends time in the dayroom, talks on the telephone several times, pt is out for meals, appetite is good Pt is medication compliant. Pt's thoughts are logical. Pt denies suicidal and homicidal ideation, denies hallucinations, denies depression, reports anxiety. Pt had some positive CIWA scores and was given Ativan, See MAR for those details which were effective. Pt c/o diarrhea, nausea, and headache, related to alcohol withdrawal and was given PRN medications for those as well, see mAR, they were effective. Will continue to monitor pt per Q15 minute face checks and monitor for safety and progress.

## 2021-03-28 NOTE — Plan of Care (Signed)
  Problem: Education: Goal: Knowledge of  General Education information/materials will improve Outcome: Progressing Goal: Emotional status will improve Outcome: Progressing Goal: Mental status will improve Outcome: Progressing Goal: Verbalization of understanding the information provided will improve Outcome: Progressing   Problem: Education: Goal: Ability to make informed decisions regarding treatment will improve Outcome: Progressing   Problem: Coping: Goal: Coping ability will improve Outcome: Progressing   Problem: Health Behavior/Discharge Planning: Goal: Identification of resources available to assist in meeting health care needs will improve Outcome: Progressing   Problem: Medication: Goal: Compliance with prescribed medication regimen will improve Outcome: Progressing   Problem: Self-Concept: Goal: Ability to disclose and discuss suicidal ideas will improve Outcome: Progressing Goal: Will verbalize positive feelings about self Outcome: Progressing   Problem: Education: Goal: Knowledge of disease or condition will improve Outcome: Progressing Goal: Understanding of discharge needs will improve Outcome: Progressing   Problem: Health Behavior/Discharge Planning: Goal: Ability to identify changes in lifestyle to reduce recurrence of condition will improve Outcome: Progressing Goal: Identification of resources available to assist in meeting health care needs will improve Outcome: Progressing   Problem: Physical Regulation: Goal: Complications related to the disease process, condition or treatment will be avoided or minimized Outcome: Progressing   Problem: Safety: Goal: Ability to remain free from injury will improve Outcome: Progressing

## 2021-03-28 NOTE — Progress Notes (Signed)
[facility] MD Progress Note  03/28/2021 11:20 AM Nathan [name]  MRN:  [id number]  CC "Still not great"  Subjective:  35 year old male with bipolar I disorder presenting with suicidal ideations with plan to shoot himself in the head. No acute events overnight, medication compliant, attending to ADLS.   Patient seen one-on-one today at bedside. He states he slept poorly overnight due to noise on the unit. He feels the lamictal is still helpful for his mood, but continues to have suicidal ideations. He is able to contract for safety in the hospital. He denies any visual hallucinations, auditory hallucinations, or homicidal ideations. Today he asks for assistance with alcohol cravings and cessation. Discussed Acamprosate and Naltrexone with patient, and he opts to try Naltrexone. Will start 25 mg daily and titrate to 50 mg daily if tolerated.    Principal Problem: Bipolar depression (HCC) Diagnosis: Principal Problem:   Bipolar depression (HCC) Active Problems:   Substance or medication-induced anxiety disorder (HCC)   Tobacco use disorder   Alcohol use disorder, severe, dependence (HCC)   Cannabis use disorder, moderate, in controlled environment (HCC)  Total Time spent with patient: 30 minutes  Past Psychiatric History: See H&P  Past Medical History:  Past Medical History:  Diagnosis Date  . Anxiety   . Bipolar 1 disorder (HCC)   . Chronic headache   . Depression 03/30/2020  . ETOH abuse 03/30/2020  . 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 Heroin addiction (HCC)   . Horseshoe kidney   . Marijuana smoker 03/30/2020    Past Surgical History:  Procedure Laterality Date  . TONSILLECTOMY     Family History:  Family History  Problem Relation Age of Onset  . Hypertension Mother   . Diabetes Mother   . Hypertension Father   . Heart failure Father   . Diabetes Father    Family Psychiatric  History: See H&P Social History:  Social History   Substance and Sexual Activity  Alcohol  Use Yes  . Alcohol/week: 24.0 standard drinks  . Types: 24 Cans of beer per week   Comment: 4 gallons a day- pt reports more than this currently     Social History   Substance and Sexual Activity  Drug Use Not Currently  . Types: Marijuana, Cocaine   Comment: Heroin addiction- denies current drug use    Social History   Socioeconomic History  . Marital status: Single    Spouse name: Not on file  . Number of children: 0  . Years of education: 28  . Highest education level: Not on file  Occupational History  . Not on file  Tobacco Use  . Smoking status: Current Every Day Smoker    Packs/day: 1.00    Types: Cigarettes  . Smokeless tobacco: Current User    Types: Chew  Vaping Use  . Vaping Use: Never used  Substance and Sexual Activity  . Alcohol use: Yes    Alcohol/week: 24.0 standard drinks    Types: 24 Cans of beer per week    Comment: 4 gallons a day- pt reports more than this currently  . Drug use: Not Currently    Types: Marijuana, Cocaine    Comment: Heroin addiction- denies current drug use  . Sexual activity: Not Currently  Other Topics Concern  . Not on file  Social History Narrative  . Not on file   Social Determinants of Health   Financial Resource Strain: Not on file  Food Insecurity: Not on file  Transportation Needs: Not on file  Physical Activity: Not on file  Stress: Not on file  Social Connections: Not on file   Additional Social History:                         Sleep: Fair  Appetite:  Good  Current Medications: Current Facility-Administered Medications  Medication Dose Route Frequency Provider Last Rate Last Admin  . acetaminophen (TYLENOL) tablet 650 mg  650 mg Oral Q6H PRN Clapacs, Jackquline Denmark, MD   650 mg at 03/28/21 0902  . alum & mag hydroxide-simeth (MAALOX/MYLANTA) 200-200-20 MG/5ML suspension 30 mL  30 mL Oral Q4H PRN Clapacs, John T, MD      . feeding supplement (ENSURE ENLIVE / ENSURE PLUS) liquid 237 mL  237 mL Oral TID  BM Jesse Sans, MD   237 mL at 03/27/21 1939  . folic acid (FOLVITE) tablet 1 mg  1 mg Oral Daily Clapacs, Jackquline Denmark, MD   1 mg at 03/28/21 0858  . hydrOXYzine (ATARAX/VISTARIL) tablet 25 mg  25 mg Oral TID PRN Jesse Sans, MD   25 mg at 03/28/21 0858  . lamoTRIgine (LAMICTAL) tablet 25 mg  25 mg Oral Daily Jesse Sans, MD   25 mg at 03/28/21 0858  . loperamide (IMODIUM) capsule 2 mg  2 mg Oral PRN Gillermo Murdoch, NP   2 mg at 03/28/21 0902  . LORazepam (ATIVAN) tablet 1-4 mg  1-4 mg Oral Q1H PRN Clapacs, Jackquline Denmark, MD   2 mg at 03/28/21 0902   Or  . LORazepam (ATIVAN) injection 1-4 mg  1-4 mg Intravenous Q1H PRN Clapacs, John T, MD      . magnesium hydroxide (MILK OF MAGNESIA) suspension 30 mL  30 mL Oral Daily PRN Clapacs, John T, MD      . multivitamin with minerals tablet 1 tablet  1 tablet Oral Daily Clapacs, Jackquline Denmark, MD   1 tablet at 03/28/21 0858  . naltrexone (DEPADE) tablet 25 mg  25 mg Oral Daily Jesse Sans, MD      . nicotine polacrilex (NICORETTE) gum 2 mg  2 mg Oral Q4H PRN Jesse Sans, MD   2 mg at 03/27/21 1817  . thiamine tablet 100 mg  100 mg Oral Daily Clapacs, John T, MD   100 mg at 03/28/21 2951   Or  . thiamine (B-1) injection 100 mg  100 mg Intravenous Daily Clapacs, John T, MD      . traZODone (DESYREL) tablet 100 mg  100 mg Oral QHS PRN Clapacs, Jackquline Denmark, MD   100 mg at 03/27/21 2205    Lab Results: No results found for this or any previous visit (from the past 48 hour(s)).  Blood Alcohol level:  Lab Results  Component Value Date   ETH 221 (H) 03/24/2021   ETH <10 02/03/2021    Metabolic Disorder Labs: Lab Results  Component Value Date   HGBA1C 5.2 02/06/2021   MPG 103 02/06/2021   Lab Results  Component Value Date   PROLACTIN 7.0 04/23/2016   Lab Results  Component Value Date   CHOL 139 02/06/2021   TRIG 55 02/06/2021   HDL 55 02/06/2021   CHOLHDL 2.5 02/06/2021   VLDL 11 02/06/2021   LDLCALC 73 02/06/2021   LDLCALC 105 (H)  04/23/2016    Physical Findings: AIMS:  , ,  ,  ,    CIWA:  CIWA-Ar Total: 12 COWS:  Musculoskeletal: Strength & Muscle Tone: within normal limits Gait & Station: normal Patient leans: N/A  Psychiatric Specialty Exam:  Presentation  General Appearance: Fairly Groomed  Eye Contact:Fair  Speech:Clear and Coherent  Speech Volume:Normal  Handedness:Right   Mood and Affect  Mood:Irritable  Affect:Congruent   Thought Process  Thought Processes:Coherent  Descriptions of Associations:Intact  Orientation:Full (Time, Place and Person)  Thought Content:Logical  History of Schizophrenia/Schizoaffective disorder:No  Duration of Psychotic Symptoms:No data recorded Hallucinations:Hallucinations: None  Ideas of Reference:None  Suicidal Thoughts:Suicidal Thoughts: Yes, Active SI Active Intent and/or Plan: Without Access to Means  Homicidal Thoughts:Homicidal Thoughts: No   Sensorium  Memory:Immediate Fair; Remote Fair; Recent Fair  Judgment:Impaired  Insight:Shallow   Executive Functions  Concentration:Fair  Attention Span:Fair  Recall:Fair  Fund of Knowledge:Fair  Language:Fair   Psychomotor Activity  Psychomotor Activity:Psychomotor Activity: Normal   Assets  Assets:Desire for Improvement; Physical Health; Resilience   Sleep  Sleep:Sleep: Fair Number of Hours of Sleep: 6    Physical Exam: Physical Exam  ROS  Blood pressure 129/79, pulse 80, temperature (!) 97.5 F (36.4 C), temperature source Oral, resp. rate 18, height 5\' 9"  (1.753 m), weight 59 kg, SpO2 97 %. Body mass index is 19.2 kg/m.   Treatment Plan Summary: Daily contact with patient to assess and evaluate symptoms and progress in treatment and Medication management  1)Bipolar I Disorder, mixed, severe vs MDD, recurrent, severe- established problem, unstable - Patient continues to endorse suicidal ideations today on exam. Feels his symptoms worsened with Geodon,  Latuda, Prozac, Depakote, Vistaril, Seroquel, Abilify, and Zoloft  - Continue Lamictal 25 mg daily, titrate biweekly as tolerated with target dose of 200 mg daily - Patient continues to decline starting an atypical antipsychotic or additional mood stabilizer while waiting for lamictal to become therapeutic   2) Alcohol Use Disorder, severe- established problem, unstable - CIWA protocol - MVI, folate, thiamine - Patient requesting outpatient detox resources  - Start Naltrexone 25 mg daily, and titrate to 50 mg daily as tolerated  3) Tobacco Use Disorder- established problem - Nicorette gum   4)Cannabis Use Disorder- established problem - Cessation counseling  03/28/21: Psychiatric exam above reviewed and remains accurate. Assessment and plan above reviewed and updated.   05/28/21, MD 03/28/2021, 11:20 AM

## 2021-03-29 MED ORDER — HYDROXYZINE HCL 25 MG PO TABS: 25.0000 mg | ORAL_TABLET | Freq: Three times a day (TID) | ORAL | 1 refills | Status: DC | PRN

## 2021-03-29 MED ORDER — NALTREXONE HCL 50 MG PO TABS: 50.0000 mg | ORAL_TABLET | Freq: Every day | ORAL | 1 refills | Status: DC

## 2021-03-29 MED ORDER — LAMOTRIGINE 25 MG PO TABS
25.0000 mg | ORAL_TABLET | Freq: Every day | ORAL | 0 refills | Status: DC
  Filled 2021-03-29: qty 7, 7d supply, fill #0

## 2021-03-29 MED ORDER — HYDROXYZINE HCL 25 MG PO TABS
25.0000 mg | ORAL_TABLET | Freq: Three times a day (TID) | ORAL | 0 refills | Status: DC | PRN
  Filled 2021-03-29: qty 21, 7d supply, fill #0

## 2021-03-29 MED ORDER — NALTREXONE HCL 50 MG PO TABS
50.0000 mg | ORAL_TABLET | Freq: Every day | ORAL | 0 refills | Status: DC
  Filled 2021-03-29: qty 7, 7d supply, fill #0

## 2021-03-29 MED ORDER — LAMOTRIGINE 100 MG PO TABS: 100.0000 mg | ORAL_TABLET | Freq: Every day | ORAL | 0 refills | Status: DC

## 2021-03-29 MED ORDER — TRAZODONE HCL 100 MG PO TABS: 100.0000 mg | ORAL_TABLET | Freq: Every evening | ORAL | 1 refills | Status: DC | PRN

## 2021-03-29 MED ORDER — NALTREXONE HCL 50 MG PO TABS
50.0000 mg | ORAL_TABLET | Freq: Every day | ORAL | Status: DC
  Administered 2021-03-30: 50 mg via ORAL
  Filled 2021-03-29 (×2): qty 1

## 2021-03-29 MED ORDER — LAMOTRIGINE 25 MG PO TABS: ORAL_TABLET | ORAL | 0 refills | Status: DC

## 2021-03-29 MED ORDER — THIAMINE HCL 100 MG PO TABS
100.0000 mg | ORAL_TABLET | Freq: Every day | ORAL | 0 refills | Status: DC
  Filled 2021-03-29: qty 7, 7d supply, fill #0

## 2021-03-29 MED ORDER — THIAMINE HCL 100 MG PO TABS: 100.0000 mg | ORAL_TABLET | Freq: Every day | ORAL | 1 refills | Status: DC

## 2021-03-29 NOTE — Progress Notes (Signed)
Patient has been pleasant. Mood is euphoric. Socializing with peers. Appears very comfortable on the unit. Denies SI, HI and AVH. Denies withdrawal symptoms

## 2021-03-29 NOTE — Progress Notes (Signed)
[facility] MD Progress Note  03/29/2021 1:46 PM Nathan [name]  MRN:  [id number]  CC "I have amazing news!"  Subjective:  35 year old male with bipolar I disorder presenting with suicidal ideations with plan to shoot himself in the head. No acute events overnight, medication compliant, attending to ADLS.   Patient seen one-on-one today. He notes that he is feeling great today. He was able to speak to his family, and notes he will be able to move back home and be with his friend. He denies any suicidal ideations, homicidal ideations, visual hallucinations, or auditory hallucinations. He denies any side effects to Naltrexone or Lamictal, and feels they are both beneficial. He reports feeling ready to return home tomorrow with outpatient follow-up. Will order 7-day supply of free medication for patient today.   Principal Problem: Bipolar depression (HCC) Diagnosis: Principal Problem:   Bipolar depression (HCC) Active Problems:   Substance or medication-induced anxiety disorder (HCC)   Tobacco use disorder   Alcohol use disorder, severe, dependence (HCC)   Cannabis use disorder, moderate, in controlled environment (HCC)  Total Time spent with patient: 30 minutes  Past Psychiatric History: See H&P  Past Medical History:  Past Medical History:  Diagnosis Date  . Anxiety   . Bipolar 1 disorder (HCC)   . Chronic headache   . Depression 03/30/2020  . ETOH abuse 03/30/2020  . 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 Heroin addiction (HCC)   . Horseshoe kidney   . Marijuana smoker 03/30/2020    Past Surgical History:  Procedure Laterality Date  . TONSILLECTOMY     Family History:  Family History  Problem Relation Age of Onset  . Hypertension Mother   . Diabetes Mother   . Hypertension Father   . Heart failure Father   . Diabetes Father    Family Psychiatric  History: See H&P Social History:  Social History   Substance and Sexual Activity  Alcohol Use Yes  . Alcohol/week: 24.0  standard drinks  . Types: 24 Cans of beer per week   Comment: 4 gallons a day- pt reports more than this currently     Social History   Substance and Sexual Activity  Drug Use Not Currently  . Types: Marijuana, Cocaine   Comment: Heroin addiction- denies current drug use    Social History   Socioeconomic History  . Marital status: Single    Spouse name: Not on file  . Number of children: 0  . Years of education: 30  . Highest education level: Not on file  Occupational History  . Not on file  Tobacco Use  . Smoking status: Current Every Day Smoker    Packs/day: 1.00    Types: Cigarettes  . Smokeless tobacco: Current User    Types: Chew  Vaping Use  . Vaping Use: Never used  Substance and Sexual Activity  . Alcohol use: Yes    Alcohol/week: 24.0 standard drinks    Types: 24 Cans of beer per week    Comment: 4 gallons a day- pt reports more than this currently  . Drug use: Not Currently    Types: Marijuana, Cocaine    Comment: Heroin addiction- denies current drug use  . Sexual activity: Not Currently  Other Topics Concern  . Not on file  Social History Narrative  . Not on file   Social Determinants of Health   Financial Resource Strain: Not on file  Food Insecurity: Not on file  Transportation Needs: Not on file  Physical Activity: Not on file  Stress: Not on file  Social Connections: Not on file   Additional Social History:                         Sleep: Fair  Appetite:  Good  Current Medications: Current Facility-Administered Medications  Medication Dose Route Frequency Provider Last Rate Last Admin  . acetaminophen (TYLENOL) tablet 650 mg  650 mg Oral Q6H PRN Clapacs, Jackquline Denmark, MD   650 mg at 03/29/21 1203  . alum & mag hydroxide-simeth (MAALOX/MYLANTA) 200-200-20 MG/5ML suspension 30 mL  30 mL Oral Q4H PRN Clapacs, John T, MD      . feeding supplement (ENSURE ENLIVE / ENSURE PLUS) liquid 237 mL  237 mL Oral TID BM Jesse Sans, MD   237 mL  at 03/28/21 1959  . folic acid (FOLVITE) tablet 1 mg  1 mg Oral Daily Clapacs, John T, MD   1 mg at 03/29/21 0813  . hydrOXYzine (ATARAX/VISTARIL) tablet 25 mg  25 mg Oral TID PRN Jesse Sans, MD   25 mg at 03/29/21 0813  . lamoTRIgine (LAMICTAL) tablet 25 mg  25 mg Oral Daily Jesse Sans, MD   25 mg at 03/29/21 0813  . loperamide (IMODIUM) capsule 2 mg  2 mg Oral PRN Gillermo Murdoch, NP   2 mg at 03/28/21 0902  . magnesium hydroxide (MILK OF MAGNESIA) suspension 30 mL  30 mL Oral Daily PRN Clapacs, John T, MD      . multivitamin with minerals tablet 1 tablet  1 tablet Oral Daily Clapacs, Jackquline Denmark, MD   1 tablet at 03/29/21 0813  . [START ON 03/30/2021] naltrexone (DEPADE) tablet 50 mg  50 mg Oral Daily Jesse Sans, MD      . nicotine polacrilex (NICORETTE) gum 2 mg  2 mg Oral Q4H PRN Jesse Sans, MD   2 mg at 03/29/21 1203  . ondansetron (ZOFRAN) tablet 8 mg  8 mg Oral Q8H PRN Jesse Sans, MD   8 mg at 03/28/21 1514  . thiamine tablet 100 mg  100 mg Oral Daily Clapacs, John T, MD   100 mg at 03/29/21 0813   Or  . thiamine (B-1) injection 100 mg  100 mg Intravenous Daily Clapacs, John T, MD      . traZODone (DESYREL) tablet 100 mg  100 mg Oral QHS PRN Clapacs, Jackquline Denmark, MD   100 mg at 03/28/21 2124    Lab Results: No results found for this or any previous visit (from the past 48 hour(s)).  Blood Alcohol level:  Lab Results  Component Value Date   ETH 221 (H) 03/24/2021   ETH <10 02/03/2021    Metabolic Disorder Labs: Lab Results  Component Value Date   HGBA1C 5.2 02/06/2021   MPG 103 02/06/2021   Lab Results  Component Value Date   PROLACTIN 7.0 04/23/2016   Lab Results  Component Value Date   CHOL 139 02/06/2021   TRIG 55 02/06/2021   HDL 55 02/06/2021   CHOLHDL 2.5 02/06/2021   VLDL 11 02/06/2021   LDLCALC 73 02/06/2021   LDLCALC 105 (H) 04/23/2016    Physical Findings: AIMS:  , ,  ,  ,    CIWA:  CIWA-Ar Total: 0 COWS:      Musculoskeletal: Strength & Muscle Tone: within normal limits Gait & Station: normal Patient leans: N/A  Psychiatric Specialty Exam:  Presentation  General Appearance:  Fairly Groomed  Eye Contact:Fair  Speech:Clear and Coherent  Speech Volume:Normal  Handedness:Right   Mood and Affect  Mood:Irritable  Affect:Congruent   Thought Process  Thought Processes:Coherent  Descriptions of Associations:Intact  Orientation:Full (Time, Place and Person)  Thought Content:Logical  History of Schizophrenia/Schizoaffective disorder:No  Duration of Psychotic Symptoms:No data recorded Hallucinations:None Ideas of Reference:None  Suicidal Thoughts:Denies Homicidal Thoughts:Denies  Sensorium  Memory:Immediate Fair; Remote Fair; Recent Fair  Judgment:Intact Insight:Fair- aware that alcohol and drugs cause worsening mental health  Executive Functions  Concentration:Fair  Attention Span:Fair  Recall:Fair  Fund of Knowledge:Fair  Language:Fair   Psychomotor Activity  Psychomotor Activity:Normal  Assets  Assets:Desire for Improvement; Physical Health; Resilience   Sleep  Sleep:Fair, 7 hours   Physical Exam: Physical Exam  ROS  Blood pressure 109/66, pulse 79, temperature 98.2 F (36.8 C), temperature source Oral, resp. rate 18, height 5\' 9"  (1.753 m), weight 59 kg, SpO2 97 %. Body mass index is 19.2 kg/m.   Treatment Plan Summary: Daily contact with patient to assess and evaluate symptoms and progress in treatment and Medication management  1)Bipolar I Disorder, mixed, severe vs MDD, recurrent, severe- established problem, unstable - Patient continues to endorse suicidal ideations today on exam. Feels his symptoms worsened with Geodon, Latuda, Prozac, Depakote, Vistaril, Seroquel, Abilify, and Zoloft  - Continue Lamictal 25 mg daily, titrate biweekly as tolerated with target dose of 200 mg daily - Patient continues to decline starting an atypical  antipsychotic or additional mood stabilizer while waiting for lamictal to become therapeutic   2) Alcohol Use Disorder, severe- established problem, unstable - CIWA protocol - MVI, folate, thiamine - Patient requesting outpatient detox resources  - Increase Naltrexone 50 mg daily  3) Tobacco Use Disorder- established problem - Nicorette gum   4)Cannabis Use Disorder- established problem - Cessation counseling  03/29/21: Psychiatric exam above reviewed and remains accurate. Assessment and plan above reviewed and updated.    05/29/21, MD 03/29/2021, 1:46 PM

## 2021-03-29 NOTE — Progress Notes (Signed)
Patient has been pleasant and cooperative. Is in a good mood. He said his mom is letting him come home to stay with her. Denies SI, and HI

## 2021-03-29 NOTE — Plan of Care (Signed)
  Problem: Education: Goal: Knowledge of [facility] Education information/materials will improve Outcome: Progressing Goal: Emotional status will improve Outcome: Progressing Goal: Mental status will improve Outcome: Progressing Goal: Verbalization of understanding the information provided will improve Outcome: Progressing   Problem: Education: Goal: Ability to make informed decisions regarding treatment will improve Outcome: Progressing   Problem: Coping: Goal: Coping ability will improve Outcome: Progressing   Problem: Health Behavior/Discharge Planning: Goal: Identification of resources available to assist in meeting health care needs will improve Outcome: Progressing   Problem: Medication: Goal: Compliance with prescribed medication regimen will improve Outcome: Progressing   Problem: Self-Concept: Goal: Ability to disclose and discuss suicidal ideas will improve Outcome: Progressing Goal: Will verbalize positive feelings about self Outcome: Progressing   Problem: Education: Goal: Knowledge of disease or condition will improve Outcome: Progressing Goal: Understanding of discharge needs will improve Outcome: Progressing   Problem: Health Behavior/Discharge Planning: Goal: Ability to identify changes in lifestyle to reduce recurrence of condition will improve Outcome: Progressing Goal: Identification of resources available to assist in meeting health care needs will improve Outcome: Progressing   Problem: Physical Regulation: Goal: Complications related to the disease process, condition or treatment will be avoided or minimized Outcome: Progressing   Problem: Safety: Goal: Ability to remain free from injury will improve Outcome: Progressing   

## 2021-03-29 NOTE — Progress Notes (Signed)
Pt is alert and oriented to person, place, time and situation. Pt is calm, cooperative, spends time with peers talking and watching tv in the dayroom. 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 Pt is medication compliant. Pt c/o anxiety early in the shift and requests and was given vistaril PRN PO which was effective. Pt is medication compliant, denies feelings of depression, is focused on discharge and reports the majority of his withdrawal symptoms have gone. Will continue to monitor pt per Q15 minute face checks and monitor for safety and progress. CIWA=0.

## 2021-03-29 NOTE — Plan of Care (Signed)
  Problem: Education: Goal: Knowledge of [facility] Education information/materials will improve Outcome: Progressing Goal: Emotional status will improve Outcome: Progressing Goal: Mental status will improve Outcome: Progressing Goal: Verbalization of understanding the information provided will improve Outcome: Progressing   Problem: Education: Goal: Ability to make informed decisions regarding treatment will improve Outcome: Progressing   Problem: Coping: Goal: Coping ability will improve Outcome: Progressing   Problem: Health Behavior/Discharge Planning: Goal: Identification of resources available to assist in meeting health care needs will improve Outcome: Progressing   Problem: Medication: Goal: Compliance with prescribed medication regimen will improve Outcome: Progressing   Problem: Self-Concept: Goal: Ability to disclose and discuss suicidal ideas will improve Outcome: Progressing Goal: Will verbalize positive feelings about self Outcome: Progressing   Problem: Education: Goal: Knowledge of disease or condition will improve Outcome: Progressing Goal: Understanding of discharge needs will improve Outcome: Progressing   Problem: Health Behavior/Discharge Planning: Goal: Ability to identify changes in lifestyle to reduce recurrence of condition will improve Outcome: Progressing Goal: Identification of resources available to assist in meeting health care needs will improve Outcome: Progressing   Problem: Physical Regulation: Goal: Complications related to the disease process, condition or treatment will be avoided or minimized Outcome: Progressing   Problem: Safety: Goal: Ability to remain free from injury will improve Outcome: Progressing   

## 2021-03-30 ENCOUNTER — Other Ambulatory Visit: Payer: Self-pay

## 2021-03-30 MED ORDER — NALTREXONE HCL 50 MG PO TABS
50.0000 mg | ORAL_TABLET | Freq: Every day | ORAL | 0 refills | Status: DC
  Filled 2021-03-30: qty 7, 7d supply, fill #0

## 2021-03-30 MED ORDER — TRAZODONE HCL 100 MG PO TABS
100.0000 mg | ORAL_TABLET | Freq: Every evening | ORAL | 0 refills | Status: DC | PRN
  Filled 2021-03-30: qty 7, 7d supply, fill #0

## 2021-03-30 MED ORDER — HYDROXYZINE HCL 25 MG PO TABS
25.0000 mg | ORAL_TABLET | Freq: Three times a day (TID) | ORAL | 0 refills | Status: DC | PRN
  Filled 2021-03-30: qty 21, 7d supply, fill #0

## 2021-03-30 MED ORDER — VITAMIN B-1 100 MG PO TABS
100.0000 mg | ORAL_TABLET | Freq: Every day | ORAL | 0 refills | Status: DC
  Filled 2021-03-30: qty 7, 7d supply, fill #0

## 2021-03-30 MED ORDER — LAMOTRIGINE 25 MG PO TABS
25.0000 mg | ORAL_TABLET | Freq: Every day | ORAL | 0 refills | Status: DC
  Filled 2021-03-30: qty 7, 7d supply, fill #0

## 2021-03-30 NOTE — Progress Notes (Signed)
Recreation Therapy Notes  INPATIENT RECREATION TR PLAN  Patient Details Name: Makhai Fulco MRN: [id number] DOB: 09-Apr-1986 Today's Date: 03/30/2021  Rec Therapy Plan Is patient appropriate for Therapeutic Recreation?: Yes Treatment times per week: at least 3 Estimated Length of Stay: 5-7 days TR Treatment/Interventions: Group participation (Comment)  Discharge Criteria Pt will be discharged from therapy if:: Discharged Treatment plan/goals/alternatives discussed and agreed upon by:: Patient/family  Discharge Summary Short term goals set: Patient will engage in groups with a calm and appropriate mood at least 2x within 5 recreation therapy group sessions Short term goals met: Not met Reason goals not met: Patient did not attend any groups Therapeutic equipment acquired: N/A Reason patient discharged from therapy: Discharge from hospital Pt/family agrees with progress & goals achieved: Yes Date patient discharged from therapy: 03/30/21   Atiba Kimberlin 03/30/2021, 12:12 PM

## 2021-03-30 NOTE — Progress Notes (Signed)
Recreation Therapy Notes    Date: 03/30/2021  Time: 10:00 am   Location: Craft room    Behavioral response: N/A   Intervention Topic: Self-care   Discussion/Intervention: Patient did not attend group.   Clinical Observations/Feedback:  Patient did not attend group.   [name] LRT/CTRS        [name] 03/30/2021 12:05 PM

## 2021-03-30 NOTE — Progress Notes (Signed)
  [facility] Adult Case Management Discharge Plan :  Will you be returning to the same living situation after discharge:  No. At discharge, do you have transportation home?: Yes,  Patient reports that his ex-girlfriend will pick him up.  Do you have the ability to pay for your medications: No.  Release of information consent forms completed and in the chart;  Patient's signature needed at discharge.  Patient to Follow up at:  Follow-up Information    [facility] Go to.   Why: Patient to meet with Lorella Nimrod, peer support, on Wednessday 01 April 2021 @ 0730 AM.  Benay Pillow information: [address] [place] [address] [phone]               Next level of care provider has access to [facility] Link:no  Safety Planning and Suicide Prevention discussed: Yes,  SPE completed with patient and his mother.   Have you used any form of tobacco in the last 30 days? (Cigarettes, Smokeless Tobacco, Cigars, and/or Pipes): Yes  Has patient been referred to the Quitline?: Patient refused referral  Patient has been referred for addiction treatment: Yes  [name], LCSWA 03/30/2021, 9:10 AM

## 2021-03-30 NOTE — Discharge Summary (Signed)
Physician Discharge Summary Note  Patient:  Nathan [name] is an 35 y.o., male MRN:  [id number] DOB:  02-Dec-1986 Patient phone:  [phone] (home)  Patient address:   [address] [place] [address],  Total Time spent with patient: 35 minutes- 25 minutes face-to-face contact with patient, 10 minutes documentation, coordination of care, scripts   Date of Admission:  03/25/2021 Date of Discharge: 03/30/2021  Reason for Admission:  35 year old male with bipolar I disorder presenting with suicidal ideations with plan to shoot himself in the head.   Principal Problem: Bipolar depression (HCC) Discharge Diagnoses: Principal Problem:   Bipolar depression (HCC) Active Problems:   Substance or medication-induced anxiety disorder (HCC)   Tobacco use disorder   Alcohol use disorder, severe, dependence (HCC)   Cannabis use disorder, moderate, in controlled environment [facility])   Past Psychiatric History: Patient has multiple presentations to the medical room as well as prior hospitlizations. He was been diagnosed with major depressive disorder and bipolar disorder in the past. Per patient he states he was diagnosed with intermittent explosive disorder and schizophrenia in the past. He has been tried on [place], Prozac, depakote, Vistaril, Seroquel, and Zoloft he felt made his symptoms worse. He did note that Thorazine seemed to be somewhat helpful, but was too sedating during the day. He has a history of multiple suicide attempts  Past Medical History:  Past Medical History:  Diagnosis Date  . Anxiety   . Bipolar 1 disorder (HCC)   . Chronic headache   . Depression 03/30/2020  . ETOH abuse 03/30/2020  . 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 Heroin addiction (HCC)   . Horseshoe kidney   . Marijuana smoker 03/30/2020    Past Surgical History:  Procedure Laterality Date  . TONSILLECTOMY     Family History:  Family History  Problem Relation Age of Onset  . Hypertension  Mother   . Diabetes Mother   . Hypertension Father   . Heart failure Father   . Diabetes Father    Family Psychiatric  History:  Family Psychiatric  History: Notes that his father has schizophrenia, and his mother has multiple suicide attempts, "everyone" with substance use Social History:  Social History   Substance and Sexual Activity  Alcohol Use Yes  . Alcohol/week: 24.0 standard drinks  . Types: 24 Cans of beer per week   Comment: 4 gallons a day- pt reports more than this currently     Social History   Substance and Sexual Activity  Drug Use Not Currently  . Types: Marijuana, Cocaine   Comment: Heroin addiction- denies current drug use    Social History   Socioeconomic History  . Marital status: Single    Spouse name: Not on file  . Number of children: 0  . Years of education: 64  . Highest education level: Not on file  Occupational History  . Not on file  Tobacco Use  . Smoking status: Current Every Day Smoker    Packs/day: 1.00    Types: Cigarettes  . Smokeless tobacco: Current User    Types: Chew  Vaping Use  . Vaping Use: Never used  Substance and Sexual Activity  . Alcohol use: Yes    Alcohol/week: 24.0 standard drinks    Types: 24 Cans of beer per week    Comment: 4 gallons a day- pt reports more than this currently  . Drug use: Not Currently    Types: Marijuana, Cocaine    Comment:  Heroin addiction- denies current drug use  . Sexual activity: Not Currently  Other Topics Concern  . Not on file  Social History Narrative  . Not on file   Social Determinants of Health   Financial Resource Strain: Not on file  Food Insecurity: Not on file  Transportation Needs: Not on file  Physical Activity: Not on file  Stress: Not on file  Social Connections: Not on file    Hospital Course:  35 year old male with bipolar I disorder presenting with suicidal ideations with plan to shoot himself in the head. While in the hospital he completed acute alcohol  detox without complication with PRN Ativan per [facility]WA protocol. He was started on Naltrexone and titrated to 50 mg daily for alcohol use disorder. He was also started on lamotrigine 25 mg daily for bipolar depression, and tolerated this medication well without signs of rash. Mood gradually improved during hospital stay. He denies suicidal ideations, homicidal ideations, visual hallucinations, and auditory hallucinations. He plans to live with family and follow-up with [facility] for continued outpatient mental health services.   Physical Findings: AIMS:  , ,  ,  ,    CIWA:  CIWA-Ar Total: 0 COWS:     Musculoskeletal: Strength & Muscle Tone: within normal limits Gait & Station: normal Patient leans: N/A   Psychiatric Specialty Exam: General Appearance: Fairly Groomed  Patent attorney::  Good  Speech:  Clear and Coherent and Normal Rate  Volume:  Normal  Mood:  Euthymic  Affect:  Congruent  Thought Process:  Coherent and Linear  Orientation:  Full (Time, Place, and Person)  Thought Content:  Logical  Suicidal Thoughts:  No  Homicidal Thoughts:  No  Memory:  Immediate;   Fair Recent;   Fair Remote;   Fair  Judgement:  Intact  Insight:  Present  Psychomotor Activity:  Normal  Concentration:  Fair  Recall:  [employer] of Knowledge:Fair  Language: Fair  Akathisia:  Negative  Handed:  Right  AIMS (if indicated):     Assets:  Communication Skills Desire for Improvement Housing Physical Health Resilience Social Support Talents/Skills  Sleep:  Number of Hours: 7  Cognition: WNL  ADL's:  Intact     Physical Exam: Physical Exam Vitals and nursing note reviewed.  Constitutional:      Appearance: Normal appearance.  HENT:     Head: Normocephalic and atraumatic.     Right Ear: External ear normal.     Left Ear: External ear normal.     Nose: Nose normal.     Mouth/Throat:     Mouth: Mucous membranes are moist.     Pharynx: Oropharynx is clear.  Eyes:     Extraocular Movements:  Extraocular movements intact.     Conjunctiva/sclera: Conjunctivae normal.     Pupils: Pupils are equal, round, and reactive to light.  Cardiovascular:     Rate and Rhythm: Normal rate.     Pulses: Normal pulses.  Pulmonary:     Effort: Pulmonary effort is normal.     Breath sounds: Normal breath sounds.  Abdominal:     General: Abdomen is flat.     Palpations: Abdomen is soft.  Musculoskeletal:        General: No swelling. Normal range of motion.     Cervical back: Normal range of motion and neck supple.  Skin:    General: Skin is warm and dry.  Neurological:     General: No focal deficit present.     Mental  Status: He is alert and oriented to person, place, and time.  Psychiatric:        Mood and Affect: Mood normal.        Behavior: Behavior normal.        Thought Content: Thought content normal.        Judgment: Judgment normal.    Review of Systems  Constitutional: Negative for appetite change and fatigue.  HENT: Negative.   Eyes: Negative.   Respiratory: Negative.   Cardiovascular: Negative.   Gastrointestinal: Negative.   Endocrine: Negative.   Genitourinary: Negative.   Musculoskeletal: Negative.   Skin: Negative.   Allergic/Immunologic: Positive for environmental allergies and food allergies.  Neurological: Negative.   Hematological: Negative.   Psychiatric/Behavioral: Negative for agitation, behavioral problems, dysphoric mood, hallucinations, sleep disturbance and suicidal ideas. The patient is not nervous/anxious.   Blood pressure 123/85, pulse 79, temperature 97.9 F (36.6 C), temperature source Oral, resp. rate 18, height 5\' 9"  (1.753 m), weight 59 kg, SpO2 97 %. Body mass index is 19.2 kg/m.   Have you used any form of tobacco in the last 30 days? (Cigarettes, Smokeless Tobacco, Cigars, and/or Pipes): Yes  Has this patient used any form of tobacco in the last 30 days? (Cigarettes, Smokeless Tobacco, Cigars, and/or Pipes)  Yes, A prescription for an  FDA-approved tobacco cessation medication was offered at discharge and the patient refused  Blood Alcohol level:  Lab Results  Component Value Date   ETH 221 (H) 03/24/2021   ETH <10 02/03/2021    Metabolic Disorder Labs:  Lab Results  Component Value Date   HGBA1C 5.2 02/06/2021   MPG 103 02/06/2021   Lab Results  Component Value Date   PROLACTIN 7.0 04/23/2016   Lab Results  Component Value Date   CHOL 139 02/06/2021   TRIG 55 02/06/2021   HDL 55 02/06/2021   CHOLHDL 2.5 02/06/2021   VLDL 11 02/06/2021   LDLCALC 73 02/06/2021   LDLCALC 105 (H) 04/23/2016    See Psychiatric Specialty Exam and Suicide Risk Assessment completed by Attending Physician prior to discharge.  Discharge destination:  Home  Is patient on multiple antipsychotic therapies at discharge:  No   Has Patient had three or more failed trials of antipsychotic monotherapy by history:  No  Recommended Plan for Multiple Antipsychotic Therapies: NA  Discharge Instructions    Diet general   Complete by: As directed    Increase activity slowly   Complete by: As directed      Allergies as of 03/30/2021      Reactions   Geodon [ziprasidone Hcl] Other (See Comments)   Seizures   Onion Anaphylaxis   Valproic Acid    Levofloxacin Itching      Medication List    TAKE these medications     Indication  hydrOXYzine 25 MG tablet Commonly known as: ATARAX/VISTARIL Take 1 tablet (25 mg total) by mouth 3 (three) times daily as needed for anxiety.  Indication: Feeling Anxious   lamoTRIgine 25 MG tablet Commonly known as: LAMICTAL Take 2 tablets (50 mg total) by mouth daily for 14 days, THEN 3 tablets (75 mg total) daily for 14 days. Start taking on: March 30, 2021  Indication: Depressive Phase of Manic-Depression   lamoTRIgine 100 MG tablet Commonly known as: LaMICtal Take 1 tablet (100 mg total) by mouth daily. Start taking on: Apr 26, 2021  Indication: Depressive Phase of Manic-Depression    naltrexone 50 MG tablet Commonly known as: DEPADE Take 1  tablet (50 mg total) by mouth daily.  Indication: Abuse or Misuse of Alcohol   thiamine 100 MG tablet Take 1 tablet (100 mg total) by mouth daily.  Indication: Deficiency of Vitamin B1   traZODone 100 MG tablet Commonly known as: DESYREL Take 1 tablet (100 mg total) by mouth at bedtime as needed for sleep.  Indication: Trouble Sleeping        Follow-up recommendations:  Activity:  as tolerated Diet:  regular diet  Comments:  7-day supply of free medications provided to patient at discharge along with printed 30-day scripts with 1 refill  Signed: Jesse SansMegan M Alizay Bronkema, MD 03/30/2021, 8:58 AM

## 2021-03-30 NOTE — Plan of Care (Signed)
  Problem: Group Participation Goal: STG - Patient will engage in groups with a calm and appropriate mood at least 2x within 5 recreation therapy group sessions Description: STG - Patient will engage in groups with a calm and appropriate mood at least 2x within 5 recreation therapy group sessions 03/30/2021 1211 by Ernest Haber, LRT Outcome: Not Applicable 3/76/2831 [id number] by Ernest Haber, LRT Outcome: Not Met (add Reason) Note: Patient did not attend any groups

## 2021-03-30 NOTE — Progress Notes (Signed)
Progress Note:   Nathan [name] is seen safely interacting with others on the milieu. Patient requested Trazodone last night and said he slept well. He has a good appetite and energy level. Patient continues to experience pain in his left ankle. Medication was given for pain and his pain level went from a 6/10 to 2/10. Patient said he had a little bit of anxiety but his goal is to go home today. Patient will continue to be monitored with 15 minute checks.  Discharge Note:   Discharge instructions were explained to client. Patient understands discharge instructions. Patient belongings were returned to patient. Patient was escorted by staff and picked up by ex-girlfriend.

## 2021-03-30 NOTE — [facility] Suicide Risk Assessment (Signed)
[facility] Discharge Suicide Risk Assessment   Principal Problem: Bipolar depression [facility]) Discharge Diagnoses: Principal Problem:   Bipolar depression (HCC) Active Problems:   Substance or medication-induced anxiety disorder (HCC)   Tobacco use disorder   Alcohol use disorder, severe, dependence (HCC)   Cannabis use disorder, moderate, in controlled environment (HCC)   Total Time spent with patient: 35 minutes- 25 minutes face-to-face contact with patient, 10 minutes documentation, coordination of care, scripts   Musculoskeletal: Strength & Muscle Tone: within normal limits Gait & Station: normal Patient leans: N/A  Psychiatric Specialty Exam: Review of Systems  Constitutional: Negative for appetite change and fatigue.  HENT: Negative.   Eyes: Negative.   Respiratory: Negative.   Cardiovascular: Negative.   Gastrointestinal: Negative.   Endocrine: Negative.   Genitourinary: Negative.   Musculoskeletal: Negative.   Skin: Negative.   Allergic/Immunologic: Positive for environmental allergies and food allergies.  Neurological: Negative.   Hematological: Negative.   Psychiatric/Behavioral: Negative for agitation, behavioral problems, dysphoric mood, hallucinations, sleep disturbance and suicidal ideas. The patient is not nervous/anxious.     Blood pressure 123/85, pulse 79, temperature 97.9 F (36.6 C), temperature source Oral, resp. rate 18, height 5\' 9"  (1.753 m), weight 59 kg, SpO2 97 %.Body mass index is 19.2 kg/m.  General Appearance: Fairly Groomed  ::  Good  Speech:  Clear and Coherent and Normal Rate  Volume:  Normal  Mood:  Euthymic  Affect:  Congruent  Thought Process:  Coherent and Linear  Orientation:  Full (Time, Place, and Person)  Thought Content:  Logical  Suicidal Thoughts:  No  Homicidal Thoughts:  No  Memory:  Immediate;   Fair Recent;   Fair Remote;   Fair  Judgement:  Intact  Insight:  Present  Psychomotor Activity:  Normal   Concentration:  Fair  Recall:  [phone] of Knowledge:Fair  Language: Fair  Akathisia:  Negative  Handed:  Right  AIMS (if indicated):     Assets:  Communication Skills Desire for Improvement Housing Physical Health Resilience Social Support Talents/Skills  Sleep:  Number of Hours: 7  Cognition: WNL  ADL's:  Intact   Mental Status Per Nursing Assessment::   On Admission:  Suicidal ideation indicated by patient  Demographic Factors:  Male, Caucasian and Unemployed  Loss Factors: NA  Historical Factors: Impulsivity  Risk Reduction Factors:   Responsible for children under 84 years of age, Sense of responsibility to family, Living with another person, especially a relative, Positive social support, Positive therapeutic relationship and Positive coping skills or problem solving skills  Continued Clinical Symptoms:  Severe Anxiety and/or Agitation Bipolar Disorder:   Depressive phase Alcohol/Substance Abuse/Dependencies Previous Psychiatric Diagnoses and Treatments  Cognitive Features That Contribute To Risk:  None    Suicide Risk:  Minimal: No identifiable suicidal ideation.  Patients presenting with no risk factors but with morbid ruminations; may be classified as minimal risk based on the severity of the depressive symptoms    Plan Of Care/Follow-up recommendations:  Activity:  as tolerated Diet:  regular diet  15, MD 03/30/2021, 8:31 AM

## 2021-08-18 ENCOUNTER — Emergency Department
Admission: EM | Admit: 2021-08-18 | Discharge: 2021-08-18 | Payer: Self-pay | Attending: Emergency Medicine | Admitting: Emergency Medicine

## 2021-08-18 ENCOUNTER — Emergency Department: Payer: Self-pay

## 2021-08-18 ENCOUNTER — Other Ambulatory Visit: Payer: Self-pay

## 2021-08-18 ENCOUNTER — Encounter: Payer: Self-pay | Admitting: Emergency Medicine

## 2021-08-18 DIAGNOSIS — S0181XA Laceration without foreign body of other part of head, initial encounter: Secondary | ICD-10-CM | POA: Insufficient documentation

## 2021-08-18 DIAGNOSIS — F17210 Nicotine dependence, cigarettes, uncomplicated: Secondary | ICD-10-CM | POA: Insufficient documentation

## 2021-08-18 DIAGNOSIS — F10929 Alcohol use, unspecified with intoxication, unspecified: Secondary | ICD-10-CM

## 2021-08-18 DIAGNOSIS — Z20822 Contact with and (suspected) exposure to covid-19: Secondary | ICD-10-CM | POA: Insufficient documentation

## 2021-08-18 DIAGNOSIS — F10129 Alcohol abuse with intoxication, unspecified: Secondary | ICD-10-CM | POA: Insufficient documentation

## 2021-08-18 DIAGNOSIS — Y908 Blood alcohol level of 240 mg/100 ml or more: Secondary | ICD-10-CM | POA: Insufficient documentation

## 2021-08-18 DIAGNOSIS — W2209XA Striking against other stationary object, initial encounter: Secondary | ICD-10-CM | POA: Insufficient documentation

## 2021-08-18 DIAGNOSIS — R4689 Other symptoms and signs involving appearance and behavior: Secondary | ICD-10-CM

## 2021-08-18 DIAGNOSIS — R456 Violent behavior: Secondary | ICD-10-CM | POA: Insufficient documentation

## 2021-08-18 LAB — CBC WITH DIFFERENTIAL/PLATELET
Abs Immature Granulocytes: 0.04 10*3/uL (ref 0.00–0.07)
Basophils Absolute: 0.1 10*3/uL (ref 0.0–0.1)
Basophils Relative: 1 %
Eosinophils Absolute: 0.1 10*3/uL (ref 0.0–0.5)
Eosinophils Relative: 1 %
HCT: 49.5 % (ref 39.0–52.0)
Hemoglobin: 17.5 g/dL — ABNORMAL HIGH (ref 13.0–17.0)
Immature Granulocytes: 0 %
Lymphocytes Relative: 40 %
Lymphs Abs: 3.9 10*3/uL (ref 0.7–4.0)
MCH: 33.5 pg (ref 26.0–34.0)
MCHC: 35.4 g/dL (ref 30.0–36.0)
MCV: 94.8 fL (ref 80.0–100.0)
Monocytes Absolute: 0.7 10*3/uL (ref 0.1–1.0)
Monocytes Relative: 8 %
Neutro Abs: 4.8 10*3/uL (ref 1.7–7.7)
Neutrophils Relative %: 50 %
Platelets: 277 10*3/uL (ref 150–400)
RBC: 5.22 MIL/uL (ref 4.22–5.81)
RDW: 13.8 % (ref 11.5–15.5)
WBC: 9.6 10*3/uL (ref 4.0–10.5)
nRBC: 0 % (ref 0.0–0.2)

## 2021-08-18 LAB — RESP PANEL BY RT-PCR (FLU A&B, COVID) ARPGX2
Influenza A by PCR: NEGATIVE
Influenza B by PCR: NEGATIVE
SARS Coronavirus 2 by RT PCR: NEGATIVE

## 2021-08-18 LAB — COMPREHENSIVE METABOLIC PANEL
ALT: 34 U/L (ref 0–44)
AST: 45 U/L — ABNORMAL HIGH (ref 15–41)
Albumin: 4.8 g/dL (ref 3.5–5.0)
Alkaline Phosphatase: 82 U/L (ref 38–126)
Anion gap: 11 (ref 5–15)
BUN: 8 mg/dL (ref 6–20)
CO2: 23 mmol/L (ref 22–32)
Calcium: 9.2 mg/dL (ref 8.9–10.3)
Chloride: 105 mmol/L (ref 98–111)
Creatinine, Ser: 0.69 mg/dL (ref 0.61–1.24)
GFR, Estimated: >60 mL/min (ref >60)
Glucose, Bld: 105 mg/dL — ABNORMAL HIGH (ref 70–99)
Potassium: 3.9 mmol/L (ref 3.5–5.1)
Sodium: 139 mmol/L (ref 135–145)
Total Bilirubin: 0.9 mg/dL (ref 0.3–1.2)
Total Protein: 8.6 g/dL — ABNORMAL HIGH (ref 6.5–8.1)

## 2021-08-18 LAB — ETHANOL: Alcohol, Ethyl (B): 329 mg/dL (ref <10)

## 2021-08-18 LAB — TROPONIN I (HIGH SENSITIVITY): Troponin I (High Sensitivity): 7 ng/L (ref <18)

## 2021-08-18 LAB — MAGNESIUM: Magnesium: 2.2 mg/dL (ref 1.7–2.4)

## 2021-08-18 MED ORDER — LIDOCAINE-EPINEPHRINE-TETRACAINE (LET) TOPICAL GEL
3.0000 mL | Freq: Once | TOPICAL | Status: AC
  Administered 2021-08-18: 3 mL via TOPICAL
  Filled 2021-08-18: qty 3

## 2021-08-18 MED ORDER — LACTATED RINGERS IV BOLUS
1000.0000 mL | Freq: Once | INTRAVENOUS | Status: AC
  Administered 2021-08-18: 1000 mL via INTRAVENOUS

## 2021-08-18 MED ORDER — TETANUS-DIPHTH-ACELL PERTUSSIS 5-2.5-18.5 LF-MCG/0.5 IM SUSY
0.5000 mL | PREFILLED_SYRINGE | Freq: Once | INTRAMUSCULAR | Status: DC
  Filled 2021-08-18: qty 0.5

## 2021-08-18 MED ORDER — LORAZEPAM 2 MG/ML IJ SOLN
2.0000 mg | Freq: Once | INTRAMUSCULAR | Status: AC
  Administered 2021-08-18: 2 mg via INTRAMUSCULAR
  Filled 2021-08-18: qty 1

## 2021-08-18 MED ORDER — DIPHENHYDRAMINE HCL 50 MG/ML IJ SOLN
50.0000 mg | Freq: Once | INTRAMUSCULAR | Status: AC
  Administered 2021-08-18: 50 mg via INTRAMUSCULAR
  Filled 2021-08-18: qty 1

## 2021-08-18 MED ORDER — HALOPERIDOL LACTATE 5 MG/ML IJ SOLN
5.0000 mg | Freq: Once | INTRAMUSCULAR | Status: DC
  Filled 2021-08-18: qty 1

## 2021-08-18 NOTE — ED Notes (Signed)
Pt is in police custody and is escorted to the room and assisted to the bed, MD @ the bedside. This RN and additional staff cleaned the patients wound with sterile water for a better assessment. Pt cooperative at this time.
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
## 2021-08-18 NOTE — ED Notes (Signed)
Pt to CT

## 2021-08-18 NOTE — ED Notes (Signed)
Pts wound dressed with gauze and curlex.

## 2021-08-18 NOTE — ED Notes (Signed)
Lab called about add on trop. 

## 2021-08-18 NOTE — ED Notes (Addendum)
E-signature pad unavailable - Pt verbalized understanding of D/C information - no additional concerns at this time. Pt in Police custody and escorted out to the parking lot by officers.
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
## 2021-08-27 ENCOUNTER — Emergency Department
Admission: EM | Admit: 2021-08-27 | Discharge: 2021-08-27 | Disposition: A | Discharge status: Other Acute Inpatient Hospital | Payer: 59 | Attending: Emergency Medicine | Admitting: Emergency Medicine

## 2021-08-27 ENCOUNTER — Other Ambulatory Visit: Payer: Self-pay

## 2021-08-27 ENCOUNTER — Inpatient Hospital Stay
Admission: AD | Admit: 2021-08-27 | Discharge: 2021-09-08 | DRG: 885 | Disposition: A | Discharge status: Home / Self Care | Payer: 59 | Source: Intra-hospital | Attending: Behavioral Health | Admitting: Behavioral Health

## 2021-08-27 DIAGNOSIS — F1020 Alcohol dependence, uncomplicated: Secondary | ICD-10-CM | POA: Diagnosis present

## 2021-08-27 DIAGNOSIS — Z9151 Personal history of suicidal behavior: Secondary | ICD-10-CM

## 2021-08-27 DIAGNOSIS — Z20822 Contact with and (suspected) exposure to covid-19: Secondary | ICD-10-CM | POA: Diagnosis present

## 2021-08-27 DIAGNOSIS — F1010 Alcohol abuse, uncomplicated: Secondary | ICD-10-CM | POA: Diagnosis present

## 2021-08-27 DIAGNOSIS — R45851 Suicidal ideations: Secondary | ICD-10-CM | POA: Insufficient documentation

## 2021-08-27 DIAGNOSIS — Y900 Blood alcohol level of less than 20 mg/100 ml: Secondary | ICD-10-CM | POA: Insufficient documentation

## 2021-08-27 DIAGNOSIS — F3163 Bipolar disorder, current episode mixed, severe, without psychotic features: Secondary | ICD-10-CM | POA: Diagnosis not present

## 2021-08-27 DIAGNOSIS — F319 Bipolar disorder, unspecified: Secondary | ICD-10-CM | POA: Diagnosis not present

## 2021-08-27 DIAGNOSIS — Z818 Family history of other mental and behavioral disorders: Secondary | ICD-10-CM | POA: Diagnosis not present

## 2021-08-27 DIAGNOSIS — F17210 Nicotine dependence, cigarettes, uncomplicated: Secondary | ICD-10-CM | POA: Diagnosis present

## 2021-08-27 DIAGNOSIS — F1390 Sedative, hypnotic, or anxiolytic use, unspecified, uncomplicated: Secondary | ICD-10-CM | POA: Insufficient documentation

## 2021-08-27 DIAGNOSIS — F3130 Bipolar disorder, current episode depressed, mild or moderate severity, unspecified: Principal | ICD-10-CM | POA: Diagnosis present

## 2021-08-27 DIAGNOSIS — F1290 Cannabis use, unspecified, uncomplicated: Secondary | ICD-10-CM | POA: Insufficient documentation

## 2021-08-27 LAB — CBC
HCT: 42.7 % (ref 39.0–52.0)
Hemoglobin: 15.3 g/dL (ref 13.0–17.0)
MCH: 34.2 pg — ABNORMAL HIGH (ref 26.0–34.0)
MCHC: 35.8 g/dL (ref 30.0–36.0)
MCV: 95.3 fL (ref 80.0–100.0)
Platelets: 218 10*3/uL (ref 150–400)
RBC: 4.48 MIL/uL (ref 4.22–5.81)
RDW: 13.4 % (ref 11.5–15.5)
WBC: 8 10*3/uL (ref 4.0–10.5)
nRBC: 0 % (ref 0.0–0.2)

## 2021-08-27 LAB — COMPREHENSIVE METABOLIC PANEL
ALT: 36 U/L (ref 0–44)
AST: 33 U/L (ref 15–41)
Albumin: 4.2 g/dL (ref 3.5–5.0)
Alkaline Phosphatase: 72 U/L (ref 38–126)
Anion gap: 8 (ref 5–15)
BUN: 14 mg/dL (ref 6–20)
CO2: 27 mmol/L (ref 22–32)
Calcium: 9.1 mg/dL (ref 8.9–10.3)
Chloride: 101 mmol/L (ref 98–111)
Creatinine, Ser: 0.62 mg/dL (ref 0.61–1.24)
GFR, Estimated: >60 mL/min (ref >60)
Glucose, Bld: 85 mg/dL (ref 70–99)
Potassium: 4.4 mmol/L (ref 3.5–5.1)
Sodium: 136 mmol/L (ref 135–145)
Total Bilirubin: 0.6 mg/dL (ref 0.3–1.2)
Total Protein: 7.6 g/dL (ref 6.5–8.1)

## 2021-08-27 LAB — URINE DRUG SCREEN, QUALITATIVE (ARMC ONLY)
Amphetamines, Ur Screen: NOT DETECTED
Barbiturates, Ur Screen: NOT DETECTED
Benzodiazepine, Ur Scrn: POSITIVE — AB
Cannabinoid 50 Ng, Ur ~~LOC~~: POSITIVE — AB
Cocaine Metabolite,Ur ~~LOC~~: NOT DETECTED
MDMA (Ecstasy)Ur Screen: NOT DETECTED
Methadone Scn, Ur: NOT DETECTED
Opiate, Ur Screen: NOT DETECTED
Phencyclidine (PCP) Ur S: NOT DETECTED
Tricyclic, Ur Screen: NOT DETECTED

## 2021-08-27 LAB — RESP PANEL BY RT-PCR (FLU A&B, COVID) ARPGX2
Influenza A by PCR: NEGATIVE
Influenza B by PCR: NEGATIVE
SARS Coronavirus 2 by RT PCR: NEGATIVE

## 2021-08-27 LAB — SALICYLATE LEVEL: Salicylate Lvl: 11.3 mg/dL (ref 7.0–30.0)

## 2021-08-27 LAB — ETHANOL: Alcohol, Ethyl (B): <10 mg/dL (ref <10)

## 2021-08-27 LAB — ACETAMINOPHEN LEVEL: Acetaminophen (Tylenol), Serum: <10 ug/mL — ABNORMAL LOW (ref 10–30)

## 2021-08-27 MED ORDER — HYDROXYZINE HCL 25 MG PO TABS
25.0000 mg | ORAL_TABLET | Freq: Three times a day (TID) | ORAL | Status: DC | PRN
  Administered 2021-08-27: 25 mg via ORAL
  Filled 2021-08-27: qty 1

## 2021-08-27 MED ORDER — HYDROXYZINE HCL 25 MG PO TABS
25.0000 mg | ORAL_TABLET | Freq: Three times a day (TID) | ORAL | Status: DC | PRN
  Administered 2021-08-28 – 2021-08-30 (×5): 25 mg via ORAL
  Filled 2021-08-27 (×5): qty 1

## 2021-08-27 MED ORDER — NALTREXONE HCL 50 MG PO TABS: 50.0000 mg | ORAL_TABLET | Freq: Every day | ORAL | Status: DC

## 2021-08-27 MED ORDER — MAGNESIUM HYDROXIDE 400 MG/5ML PO SUSP
30.0000 mL | Freq: Every day | ORAL | Status: DC | PRN
  Administered 2021-09-05 – 2021-09-07 (×2): 30 mL via ORAL
  Filled 2021-08-27 (×2): qty 30

## 2021-08-27 MED ORDER — TRAZODONE HCL 50 MG PO TABS: 50.0000 mg | ORAL_TABLET | Freq: Every evening | ORAL | Status: DC | PRN

## 2021-08-27 MED ORDER — LAMOTRIGINE 25 MG PO TABS
25.0000 mg | ORAL_TABLET | Freq: Every day | ORAL | Status: DC
  Filled 2021-08-27: qty 1

## 2021-08-27 MED ORDER — LAMOTRIGINE 25 MG PO TABS
25.0000 mg | ORAL_TABLET | Freq: Every day | ORAL | Status: DC
  Administered 2021-08-27: 25 mg via ORAL
  Filled 2021-08-27: qty 1

## 2021-08-27 MED ORDER — ACETAMINOPHEN 325 MG PO TABS
650.0000 mg | ORAL_TABLET | Freq: Once | ORAL | Status: AC
  Administered 2021-08-27: 650 mg via ORAL
  Filled 2021-08-27: qty 2

## 2021-08-27 MED ORDER — THIAMINE HCL 100 MG PO TABS
100.0000 mg | ORAL_TABLET | Freq: Every day | ORAL | Status: DC
  Administered 2021-08-28 – 2021-09-08 (×12): 100 mg via ORAL
  Filled 2021-08-27 (×12): qty 1

## 2021-08-27 MED ORDER — THIAMINE HCL 100 MG PO TABS
100.0000 mg | ORAL_TABLET | Freq: Every day | ORAL | Status: DC
  Administered 2021-08-27: 100 mg via ORAL
  Filled 2021-08-27: qty 1

## 2021-08-27 MED ORDER — ALUM & MAG HYDROXIDE-SIMETH 200-200-20 MG/5ML PO SUSP
30.0000 mL | ORAL | Status: DC | PRN
  Administered 2021-08-31 – 2021-09-01 (×2): 30 mL via ORAL
  Filled 2021-08-27 (×2): qty 30

## 2021-08-27 MED ORDER — ACETAMINOPHEN 325 MG PO TABS
650.0000 mg | ORAL_TABLET | Freq: Four times a day (QID) | ORAL | Status: DC | PRN
  Administered 2021-08-28 – 2021-09-08 (×24): 650 mg via ORAL
  Filled 2021-08-27 (×24): qty 2

## 2021-08-27 MED ORDER — QUETIAPINE FUMARATE 100 MG PO TABS: 100.0000 mg | ORAL_TABLET | Freq: Every day | ORAL | Status: DC

## 2021-08-27 MED ORDER — QUETIAPINE FUMARATE 25 MG PO TABS
100.0000 mg | ORAL_TABLET | Freq: Every day | ORAL | Status: DC
  Administered 2021-08-27: 25 mg via ORAL
  Filled 2021-08-27: qty 4

## 2021-08-27 NOTE — Consult Note (Addendum)
[facility]   Reason for Consult:  Consult on 35 year old male who is presented voluntarily to ED for suicidal ideation   Referring Physician:  Vicente MalesBradler Patient Identification: Nathan [name] MRN:  [id number] Principal Diagnosis: Suicidal ideation Diagnosis:  Principal Problem:   Suicidal ideation Active Problems:   Bipolar 1 disorder, mixed, severe (HCC)   Total Time spent with patient: 1 hour  Subjective:  "My brain is glitching out" Nathan [name] is a 35 y.o. male patient admitted with suicidal ideation.  HPI:  Patient was seen, evaluated, chart reviewed. Nathan [name] is a 35 year old male with a history of bipolar I disorder and substance use disorder who called 911 to be brought to the ED because he was feeling suicidal. "I have been really depressed the last few days, but I don't know why." Patient has had several hospitalizations at this hospital. He states he has not been taking the discharge medications (from last inpatient psychiatric hospitalization in April 2022) because he didn't think they helped. Although, he said he took the Naltrexone "until it ran out." Patient was last here at this ED on 08/18/21, after he had an argument with  his mother, with whom he was living, and hit his head on a concrete barrier and received sutures. Patient states that he was intoxicated at that time, but that is the last time he had any alcohol. He has been staying away from his mother's house and has had no contact with her. Patient states that when he called 911 to get him to the hospital this evening, he was told that his mother took out a restraining order. This is further upsetting him. Patient states to writer "I am usually drunk when I feel suicidal, but I haven't had any alcohol or drugs for a week." Patient is tearful,  with pressured speech, somewhat tangential but able to be re-directed during interview.   Patient c/o headache. Per EDP he has post-concussion  syndrome. Patient states he is suicidal and will "definitely kill himself" if he is discharged. He denies homicidal ideation, paranoia, or auditory or visual hallucinations. Patient is not sure that the medications he received on discharge last time are helping.  Patient states he has a girlfriend who lives with his mother. 34-year-old son also lives with mother. He lived with mother as well, until a week ago. Patient gives Clinical research associatewriter permission to contact his girlfriend Elvin SoWindy Counts [phone]). No answer.   Past Psychiatric History: Bipolar I disorder, suicidal ideations, polysubstance abuse  Risk to Self:   Risk to Others:   Prior Inpatient Therapy:   Prior Outpatient Therapy:    Past Medical History:  Past Medical History:  Diagnosis Date   Anxiety    Bipolar 1 disorder (HCC)    Chronic headache    Depression 03/30/2020   ETOH abuse 03/30/2020   Hep C w/o coma, chronic (HCC)    Heroin addiction (HCC)    Horseshoe kidney    Marijuana smoker 03/30/2020    Past Surgical History:  Procedure Laterality Date   TONSILLECTOMY     Family History:  Family History  Problem Relation Age of Onset   Hypertension Mother    Diabetes Mother    Hypertension Father    Heart failure Father    Diabetes Father    Family Psychiatric  History: Unable to remember per patient Social History:  Social History   Substance and Sexual Activity  Alcohol Use Yes   Alcohol/week: 24.0 standard drinks   Types:  24 Cans of beer per week   Comment: 4 gallons a day- pt reports more than this currently     Social History   Substance and Sexual Activity  Drug Use Not Currently   Types: Marijuana, Cocaine   Comment: Heroin addiction- denies current drug use    Social History   Socioeconomic History   Marital status: Single    Spouse name: Not on file   Number of children: 0   Years of education: 11   Highest education level: Not on file  Occupational History   Not on file  Tobacco Use    Smoking status: Every Day    Packs/day: 1.00    Types: Cigarettes   Smokeless tobacco: Current    Types: Chew  Vaping Use   Vaping Use: Never used  Substance and Sexual Activity   Alcohol use: Yes    Alcohol/week: 24.0 standard drinks    Types: 24 Cans of beer per week    Comment: 4 gallons a day- pt reports more than this currently   Drug use: Not Currently    Types: Marijuana, Cocaine    Comment: Heroin addiction- denies current drug use   Sexual activity: Not Currently  Other Topics Concern   Not on file  Social History Narrative   Not on file   Social Determinants of Health   Financial Resource Strain: Not on file  Food Insecurity: Not on file  Transportation Needs: Not on file  Physical Activity: Not on file  Stress: Not on file  Social Connections: Not on file   Additional Social History:    Allergies:   Allergies  Allergen Reactions   Geodon [Ziprasidone Hcl] Other (See Comments)    Seizures   Onion Anaphylaxis   Valproic Acid    Levofloxacin Itching    Labs:  Results for orders placed or performed during the hospital encounter of 08/27/21 (from the past 48 hour(s))  Comprehensive metabolic panel     Status: None   Collection Time: 08/27/21  4:30 PM  Result Value Ref Range   Sodium 136 135 - 145 mmol/L   Potassium 4.4 3.5 - 5.1 mmol/L   Chloride 101 98 - 111 mmol/L   CO2 27 22 - 32 mmol/L   Glucose, Bld 85 70 - 99 mg/dL    Comment: Glucose reference range applies only to samples taken after fasting for at least 8 hours.   BUN 14 6 - 20 mg/dL   Creatinine, Ser 3.66 0.61 - 1.24 mg/dL   Calcium 9.1 8.9 - 29.4 mg/dL   Total Protein 7.6 6.5 - 8.1 g/dL   Albumin 4.2 3.5 - 5.0 g/dL   AST 33 15 - 41 U/L   ALT 36 0 - 44 U/L   Alkaline Phosphatase 72 38 - 126 U/L   Total Bilirubin 0.6 0.3 - 1.2 mg/dL   GFR, Estimated >76 >54 mL/min    Comment: (NOTE) Calculated using the CKD-EPI Creatinine Equation (2021)    Anion gap 8 5 - 15    Comment: Performed at  [facility], [address] [place], [place] [address]  Ethanol     Status: None   Collection Time: 08/27/21  4:30 PM  Result Value Ref Range   Alcohol, Ethyl (B) <10 <10 mg/dL    Comment: (NOTE) Lowest detectable limit for serum alcohol is 10 mg/dL.  For medical purposes only. Performed at [facility], [address] [place], [place] [address]   Salicylate level  Status: None   Collection Time: 08/27/21  4:30 PM  Result Value Ref Range   Salicylate Lvl 11.3 7.0 - 30.0 mg/dL    Comment: Performed at [facility], [address] [place], [place] [address]  Acetaminophen level     Status: Abnormal   Collection Time: 08/27/21  4:30 PM  Result Value Ref Range   Acetaminophen (Tylenol), Serum <10 (L) 10 - 30 ug/mL    Comment: (NOTE) Therapeutic concentrations vary significantly. A range of 10-30 ug/mL  may be an effective concentration for many patients. However, some  are best treated at concentrations outside of this range. Acetaminophen concentrations >150 ug/mL at 4 hours after ingestion  and >50 ug/mL at 12 hours after ingestion are often associated with  toxic reactions.  Performed at [facility], [address] [place], [place] [address]   cbc     Status: Abnormal   Collection Time: 08/27/21  4:30 PM  Result Value Ref Range   WBC 8.0 4.0 - 10.5 K/uL   RBC 4.48 4.22 - 5.81 MIL/uL   Hemoglobin 15.3 13.0 - 17.0 g/dL   HCT 82.9 56.2 - 13.0 %   MCV 95.3 80.0 - 100.0 fL   MCH 34.2 (H) 26.0 - 34.0 pg   MCHC 35.8 30.0 - 36.0 g/dL   RDW 86.5 78.4 - 69.6 %   Platelets 218 150 - 400 K/uL   nRBC 0.0 0.0 - 0.2 %    Comment: Performed at [facility], [address] [place], [place] [address]  Urine Drug Screen, Qualitative     Status: Abnormal   Collection Time: 08/27/21  4:30 PM  Result Value Ref Range   Tricyclic, Ur Screen NONE DETECTED NONE DETECTED   Amphetamines, Ur Screen NONE DETECTED NONE DETECTED   MDMA  (Ecstasy)Ur Screen NONE DETECTED NONE DETECTED   Cocaine Metabolite,Ur [place] NONE DETECTED NONE DETECTED   Opiate, Ur Screen NONE DETECTED NONE DETECTED   Phencyclidine (PCP) Ur S NONE DETECTED NONE DETECTED   Cannabinoid 50 Ng, Ur [place] POSITIVE (A) NONE DETECTED   Barbiturates, Ur Screen NONE DETECTED NONE DETECTED   Benzodiazepine, Ur Scrn POSITIVE (A) NONE DETECTED   Methadone Scn, Ur NONE DETECTED NONE DETECTED    Comment: (NOTE) Tricyclics + metabolites, urine    Cutoff 1000 ng/mL Amphetamines + metabolites, urine  Cutoff 1000 ng/mL MDMA (Ecstasy), urine              Cutoff 500 ng/mL Cocaine Metabolite, urine          Cutoff 300 ng/mL Opiate + metabolites, urine        Cutoff 300 ng/mL Phencyclidine (PCP), urine         Cutoff 25 ng/mL Cannabinoid, urine                 Cutoff 50 ng/mL Barbiturates + metabolites, urine  Cutoff 200 ng/mL Benzodiazepine, urine              Cutoff 200 ng/mL Methadone, urine                   Cutoff 300 ng/mL  The urine drug screen provides only a preliminary, unconfirmed analytical test result and should not be used for non-medical purposes. Clinical consideration and professional judgment should be applied to any positive drug screen result due to possible interfering substances. A more specific alternate chemical method must be used in order to obtain a confirmed analytical result. Gas chromatography / mass spectrometry (  GC/MS) is the preferred confirm atory method. Performed at [facility], [address] [place], [place] [address]     Current Facility-Administered Medications  Medication Dose Route Frequency Provider Last Rate Last Admin   hydrOXYzine (ATARAX/VISTARIL) tablet 25 mg  25 mg Oral TID PRN Vanetta Mulders, NP       lamoTRIgine (LAMICTAL) tablet 25 mg  25 mg Oral Daily Gabriel Cirri F, NP   25 mg at 08/27/21 1849   QUEtiapine (SEROQUEL) tablet 100 mg  100 mg Oral QHS Clapacs, John T, MD       thiamine tablet 100 mg   100 mg Oral Daily Gabriel Cirri F, NP   100 mg at 08/27/21 1849   Current Outpatient Medications  Medication Sig Dispense Refill   hydrOXYzine (ATARAX/VISTARIL) 25 MG tablet Take 1 tablet (25 mg total) by mouth 3 (three) times daily as needed for anxiety. 21 tablet 0   lamoTRIgine (LAMICTAL) 100 MG tablet Take 1 tablet (100 mg total) by mouth daily. 30 tablet 0   lamoTRIgine (LAMICTAL) 25 MG tablet Take 1 tablet (25 mg total) by mouth daily for 7 days. 7 tablet 0   naltrexone (DEPADE) 50 MG tablet Take 1 tablet (50 mg total) by mouth daily. 7 tablet 0   thiamine (VITAMIN B-1) 100 MG tablet Take 1 tablet (100 mg total) by mouth daily. 7 tablet 0   traZODone (DESYREL) 100 MG tablet Take 1 tablet (100 mg total) by mouth at bedtime as needed for sleep. 7 tablet 0    Musculoskeletal: Strength & Muscle Tone: within normal limits Gait & Station: normal Patient leans: N/A            Psychiatric Specialty Exam:  Presentation  General Appearance: Appropriate for Environment  Eye Contact:Good  Speech:Pressured  Speech Volume:Normal  Handedness:Right   Mood and Affect  Mood:Anxious; Depressed  Affect:Congruent   Thought Process  Thought Processes:Linear  Descriptions of Associations:Intact  Orientation:Full (Time, Place and Person)  Thought Content:WDL  History of Schizophrenia/Schizoaffective disorder:No  Duration of Psychotic Symptoms:No data recorded Hallucinations:Hallucinations: None Ideas of Reference:None  Suicidal Thoughts:Suicidal Thoughts: Yes, Active SI Active Intent and/or Plan: With Intent; Without Plan Homicidal Thoughts:Homicidal Thoughts: No  Sensorium  Memory:Immediate Good; Recent Fair  Judgment:Impaired  Insight:Fair   Executive Functions  Concentration:Fair  Attention Span:Fair  Recall:Fair  Fund of Knowledge:Fair  Language:Fair   Psychomotor Activity  Psychomotor Activity: Psychomotor Activity: Increased  Assets   Assets:Resilience   Sleep  Sleep: Sleep: Fair  Physical Exam: Physical Exam Vitals and nursing note reviewed.  HENT:     Head: Normocephalic.     Nose: No congestion or rhinorrhea.  Eyes:     General:        Right eye: No discharge.        Left eye: No discharge.  Cardiovascular:     Rate and Rhythm: Normal rate.  Pulmonary:     Effort: Pulmonary effort is normal.  Musculoskeletal:        General: Normal range of motion.     Cervical back: Normal range of motion.  Skin:    General: Skin is warm and dry.  Neurological:     Mental Status: He is alert and oriented to person, place, and time.  Psychiatric:        Attention and Perception: Attention normal.        Mood and Affect: Mood is anxious and depressed.        Speech: Speech normal.  Thought Content: Thought content includes suicidal ideation.        Judgment: Judgment is impulsive.   Review of Systems  Constitutional: Negative.   HENT: Negative.    Eyes: Negative.   Respiratory: Negative.    Cardiovascular: Negative.   Gastrointestinal: Negative.   Genitourinary: Negative.   Musculoskeletal: Negative.   Skin: Negative.   Neurological:  Positive for headaches.  Psychiatric/Behavioral:  Positive for depression and suicidal ideas. The patient is nervous/anxious.   Blood pressure 128/89, pulse 88, temperature 98.3 F (36.8 C), temperature source Oral, resp. rate 16, height 5\' 9"  (1.753 m), weight 61.2 kg, SpO2 100 %. Body mass index is 19.94 kg/m.  Treatment Plan Summary: Patient is a 35 year old male, well-known to this facility, with a history of Bipolar I disorder, polysubstance abuse who presented to ED voluntarily for suicidal ideations. Notified TTS that patient needs admission, either to our BMU or referred out. Orders for hydroxyzine Lamictal, Seroquel, thiamine.   Disposition: Recommend psychiatric Inpatient admission when medically cleared. Supportive therapy provided about ongoing  stressors.  20, NP 08/27/2021 7:23 PM

## 2021-08-27 NOTE — ED Notes (Signed)
Pt given a sandwich tray ?

## 2021-08-27 NOTE — ED Notes (Signed)
Pt dressed out with this RN and Caitlyn NT. Belongings include:  Boots Orange Copywriter, advertising BC powder Papers boxers   Gray ring on finger will not come off  NUMBERS PT [employer] TRIAL MANAGER [phone] RON- BOSS [phone]

## 2021-08-27 NOTE — ED Triage Notes (Signed)
Pt to ED for psych eval. States he is going through a lot, feeling depressed, "tired of being here", just got served papers not to contact mother, has not slept in days/  Pt tearful in triage, shaking Continuously stating, "I am so lost"

## 2021-08-27 NOTE — BH Assessment (Addendum)
PATIENT BED AVAILABLE AFTER 10PM and pending Negative COVID  Patient is to be admitted to [facility] by Psychiatric Nurse Practitioner Gillermo Murdoch.  Attending Physician will be Dr. Neale Burly.   Patient has been assigned to room 323, by [facility] Charge Nurse [place].   Intake Paper Work has been signed and placed on patient chart.  ER staff is aware of the admission: [facility] ER Secretary   Dr. Vicente Males, ER MD  [facility] Patient's Nurse  Rosey Bath Patient Access.
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
## 2021-08-28 ENCOUNTER — Other Ambulatory Visit: Payer: Self-pay

## 2021-08-28 MED ORDER — FLUOXETINE HCL 20 MG PO CAPS
20.0000 mg | ORAL_CAPSULE | Freq: Every day | ORAL | Status: DC
  Administered 2021-08-28 – 2021-09-03 (×7): 20 mg via ORAL
  Filled 2021-08-28 (×7): qty 1

## 2021-08-28 MED ORDER — OLANZAPINE 5 MG PO TABS
5.0000 mg | ORAL_TABLET | Freq: Every day | ORAL | Status: DC
  Administered 2021-08-28 – 2021-08-29 (×2): 5 mg via ORAL
  Filled 2021-08-28 (×2): qty 1

## 2021-08-28 MED ORDER — NICOTINE POLACRILEX 2 MG MT GUM
2.0000 mg | CHEWING_GUM | OROMUCOSAL | Status: DC | PRN
  Administered 2021-08-28 – 2021-09-08 (×33): 2 mg via ORAL
  Filled 2021-08-28 (×20): qty 1
  Filled 2021-08-28: qty 2
  Filled 2021-08-28 (×12): qty 1

## 2021-08-28 MED ORDER — NALTREXONE HCL 50 MG PO TABS
50.0000 mg | ORAL_TABLET | Freq: Every day | ORAL | Status: DC
  Administered 2021-08-28 – 2021-09-08 (×12): 50 mg via ORAL
  Filled 2021-08-28 (×12): qty 1

## 2021-08-28 NOTE — Group Note (Addendum)
[facility] LCSW Group Therapy Note   Group Date: 08/28/2021 Start Time: 1300 End Time: 1400  Type of Therapy and Topic:  Group Therapy:  Feelings around Relapse and Recovery  Participation Level:  Active   Mood: Euthymic; patient presents as pressured and manic.   Description of Group:    Patients in this group will discuss emotions they experience before and after a relapse. They will process how experiencing these feelings, or avoidance of experiencing them, relates to having a relapse. Facilitator will guide patients to explore emotions they have related to recovery. Patients will be encouraged to process which emotions are more powerful. They will be guided to discuss the emotional reaction significant others in their lives may have to patients' relapse or recovery. Patients will be assisted in exploring ways to respond to the emotions of others without this contributing to a relapse.  Therapeutic Goals: Patient will identify two or more emotions that lead to relapse for them:  Patient will identify two emotions that result when they relapse:  Patient will identify two emotions related to recovery:  Patient will demonstrate ability to communicate their needs through discussion and/or role plays.   Summary of Patient Progress: Patient presented to group halfway trough. When present, patient was an active listener and participated in the topic of discussion. Patient presented as hyper-verbal with pressured speech. Patient shared that he came to the hospital  because he was feeling depressed after loosing custody of his child. Reports, instead of drinking, he came to the emergency room.     Therapeutic Modalities:   Cognitive Behavioral Therapy Solution-Focused Therapy Assertiveness Training Relapse Prevention Therapy   [name], [place]

## 2021-08-28 NOTE — Progress Notes (Signed)
Pt arrived the unit at  2255 pm. Pt alert and oriented x4. Pt described his emotions as angry, anxious, depressed and disappointed in himself. Pt reports that he has been homeless a while back now. Pt endorses passive suicidal ideation without a plan and states " I feel like I want to die, and the world is weighing down on me and I'm made of glass and I'm going to shatter". Pt verbally contracts for safety. The patient describes himself as a functioning alcoholic, and reports that he has been clean for over a week after going for Detox at [facility]. Pt reports that he used to drink about 3-4 gallons of alcohol a day, and would get really vicious and violent at that point. Pt reports that he feels sad, helpless and depressed right now, that is why he came to the hospital instead of running back to the "[place]". Pt reports that he feels like hurting himself because he has lost everything, but thinks about his son often, which keeps him going. The patient reports that he used to live with his mom, son and girlfriend, but they put him out, and he has no family support. Pt reports that he is unable to get in touch with his son, and is worried about him. Pt reports that he wants to get help and get back on track.  Pt denies homicidal ideations, auditory or visual hallucinations.Positive therapeutic approach and encouragement applied. Nursing assessment completed. Expected behaviors and unit guidelines reviewed with the patient. Snacks and drink offered. 2 person skin check completed, no contraband found. Pt has a superficial cut on his forehead and several tattoos all over his body. Unit and room orientation completed. Hygiene products provided. Pt is aware of 15 minute rounding for safety checks and acknowledges his understanding. Pt remains safe on the unit at this time.

## 2021-08-28 NOTE — Plan of Care (Signed)
D: Patient resting in bed this am will lights off and pillow over eyes. Informs RN he did not rest well is extremely agitated, depressed and anxious. Denies SI at present. States, "I'm not having thoughts right now. Denies HI/AH/VH. Endorses headache. Rates pain 6/10. Prn medications given for anxiety and pain. Patient out on the unit later this afternoon interacting with peers and staff. Frequent request for prn medication (nicotine gum and ant-anxiety meds). Refused Lamictal this am. Informs RN it causes him to have headaches. No other complaints verbalized.  A: Labs and vital signs monitored. Patient supported emotionally and encouraged to express concerns. Med compliant other than Lamictal.   R: Med compliant other than Lamictal. Will continue to monitor.  Problem: Education:  Goal: Knowledge of [facility] Education information/materials will improve Outcome: Not Progressing Goal: Emotional status will improve Outcome: Not Progressing Goal: Mental status will improve Outcome: Not Progressing Goal: Verbalization of understanding the information provided will improve Outcome: Not Progressing   Problem: Activity: Goal: Interest or engagement in activities will improve Outcome: Not Progressing Goal: Sleeping patterns will improve Outcome: Not Progressing   Problem: Coping: Goal: Ability to verbalize frustrations and anger appropriately will improve Outcome: Not Progressing Goal: Ability to demonstrate self-control will improve Outcome: Not Progressing   Problem: Health Behavior/Discharge Planning: Goal: Identification of resources available to assist in meeting health care needs will improve Outcome: Not Progressing Goal: Compliance with treatment plan for underlying cause of condition will improve Outcome: Not Progressing   Problem: Safety: Goal: Periods of time without injury will increase Outcome: Not Progressing   Problem: Education: Goal: Utilization of techniques  to improve thought processes will improve Outcome: Not Progressing Goal: Knowledge of the prescribed therapeutic regimen will improve Outcome: Not Progressing   Problem: Activity: Goal: Interest or engagement in leisure activities will improve Outcome: Not Progressing Goal: Imbalance in normal sleep/wake cycle will improve Outcome: Not Progressing   Problem: Coping: Goal: Coping ability will improve Outcome: Not Progressing Goal: Will verbalize feelings Outcome: Not Progressing   Problem: Health Behavior/Discharge Planning: Goal: Ability to make decisions will improve Outcome: Not Progressing Goal: Compliance with therapeutic regimen will improve Outcome: Not Progressing   Problem: Role Relationship: Goal: Will demonstrate positive changes in social behaviors and relationships Outcome: Not Progressing   Problem: Safety: Goal: Ability to disclose and discuss suicidal ideas will improve Outcome: Not Progressing Goal: Ability to identify and utilize support systems that promote safety will improve Outcome: Not Progressing   Problem: Self-Concept: Goal: Will verbalize positive feelings about self Outcome: Not Progressing Goal: Level of anxiety will decrease Outcome: Not Progressing   Problem: Medication: Goal: Compliance with prescribed medication regimen will improve Outcome: Not Progressing

## 2021-08-28 NOTE — Tx Team (Signed)
Initial Treatment Plan 08/28/2021 1:09 AM Nathan [name] [id number]    PATIENT STRESSORS: Financial difficulties   Legal issue   Loss of family support   Marital or family conflict   Substance abuse     PATIENT STRENGTHS: Capable of independent living  Communication skills  Other: Relationship with son   PATIENT IDENTIFIED PROBLEMS: Depression  Anxiety  Suicidal ideation without plan                 DISCHARGE CRITERIA:  Adequate post-discharge living arrangements Improved stabilization in mood, thinking, and/or behavior Motivation to continue treatment in a less acute level of care Reduction of life-threatening or endangering symptoms to within safe limits Safe-care adequate arrangements made Verbal commitment to aftercare and medication compliance  PRELIMINARY DISCHARGE PLAN: Attend aftercare/continuing care group Attend 12-step recovery group Outpatient therapy Placement in alternative living arrangements  PATIENT/FAMILY INVOLVEMENT: This treatment plan has been presented to and reviewed with the patient, Nathan [name]. The patient and family have been given the opportunity to ask questions and make suggestions.  Mancel Bale, RN 08/28/2021, 1:09 AM

## 2021-08-28 NOTE — H&P (Signed)
Psychiatric Admission Assessment Adult  Patient Identification: Nathan KatosHarmon [name] [name] MRN:  [id number] Date of Evaluation:  08/28/2021 Chief Complaint:  Bipolar 1 disorder (HCC) [F31.9] Principal Diagnosis: Bipolar 1 disorder, depressed (HCC) Diagnosis:  Principal Problem:   Bipolar 1 disorder, depressed (HCC) Active Problems:   Alcohol use disorder, severe, dependence (HCC)  CC"If I feel this bad, I may as well die."  History of Present Illness: Nathan [name] is a 35 year old male with a history of bipolar I disorder and substance use disorder who called 911 to be brought to the ED because he was feeling suicidal. He recently completed alcohol detox at [facility], but even sober continuing to have severe depression. He has had multiple medication trials in the past that he found unhelpful, or gave him side effects. He is continuing to feel suicidal today. Denies any HI/AH/VH. Main stressor is his mother taking out a restraining order against him. His girlfriend and son live with mother, and he is concerned he won't be able to see son again.   [phone]: No answer, HIPPA compliant message left with call back number.  Associated Signs/Symptoms: Depression Symptoms:  depressed mood, feelings of worthlessness/guilt, hopelessness, suicidal thoughts with specific plan, anxiety, Duration of Depression Symptoms: Greater than two weeks  (Hypo) Manic Symptoms:  Impulsivity, Labiality of Mood, Anxiety Symptoms:  Excessive Worry, Psychotic Symptoms:   Denies PTSD Symptoms: Negative Total Time spent with patient: 1 hour  Past Psychiatric History: Patient has multiple presentations to the emergency room as well as prior hospitlizations. He was been diagnosed with major depressive disorder and bipolar disorder in the past. Per patient he states he was diagnosed with intermittent explosive disorder and schizophrenia in the past. He has been tried on Geodon, Thorazine, Latuda,  Prozac, depakote, Vistaril, Seroquel, Lamictal, Abilify and Zoloft he felt made his symptoms worse. Marland Kitchen. He has a history of multiple suicide attempts.   Is the patient at risk to self? Yes.    Has the patient been a risk to self in the past 6 months? Yes.    Has the patient been a risk to self within the distant past? Yes.    Is the patient a risk to others? No.  Has the patient been a risk to others in the past 6 months? No.  Has the patient been a risk to others within the distant past? No.   Prior Inpatient Therapy:   Prior Outpatient Therapy:    Alcohol Screening: 1. How often do you have a drink containing alcohol?: 4 or more times a week 2. How many drinks containing alcohol do you have on a typical day when you are drinking?: 5 or 6 3. How often do you have six or more drinks on one occasion?: Daily or almost daily AUDIT-C Score: 10 4. How often during the last year have you found that you were not able to stop drinking once you had started?: Daily or almost daily 5. How often during the last year have you failed to do what was normally expected from you because of drinking?: Daily or almost daily 6. How often during the last year have you needed a first drink in the morning to get yourself going after a heavy drinking session?: Daily or almost daily 7. How often during the last year have you had a feeling of guilt of remorse after drinking?: Daily or almost daily 8. How often during the last year have you been unable to remember what happened the night  before because you had been drinking?: Daily or almost daily 9. Have you or someone else been injured as a result of your drinking?: Yes, during the last year 10. Has a relative or friend or a doctor or another health worker been concerned about your drinking or suggested you cut down?: Yes, during the last year Alcohol Use Disorder Identification Test Final Score (AUDIT): 38 Alcohol Brief Interventions/Follow-up: Alcohol education/Brief  advice (Pt reports he just completed a detox program last week, and has since stayed off alcohol, but fights off the urge when he sees a bottle or an advert for alcohol.Marland Kitchen) Substance Abuse History in the last 12 months:  Yes.   Consequences of Substance Abuse: Worsening mental health, suicidal ideations, estrangement to family Previous Psychotropic Medications: Yes  Psychological Evaluations: Yes  Past Medical History:  Past Medical History:  Diagnosis Date   Anxiety    Bipolar 1 disorder (HCC)    Chronic headache    Depression 03/30/2020   ETOH abuse 03/30/2020   Hep C w/o coma, chronic (HCC)    Heroin addiction (HCC)    Horseshoe kidney    Marijuana smoker 03/30/2020    Past Surgical History:  Procedure Laterality Date   TONSILLECTOMY     Family History:  Family History  Problem Relation Age of Onset   Hypertension Mother    Diabetes Mother    Hypertension Father    Heart failure Father    Diabetes Father    Family Psychiatric  History: Notes that his father has schizophrenia, and his mother has multiple suicide attempts, "everyone" with substance use  Tobacco Screening:   Social History:  Social History   Substance and Sexual Activity  Alcohol Use Yes   Alcohol/week: 24.0 standard drinks   Types: 24 Cans of beer per week   Comment: 4 gallons a day- pt reports more than this currently     Social History   Substance and Sexual Activity  Drug Use Not Currently   Types: Marijuana, Cocaine   Comment: Heroin addiction- denies current drug use    Additional Social History:                           Allergies:   Allergies  Allergen Reactions   Geodon [Ziprasidone Hcl] Other (See Comments)    Seizures   Onion Anaphylaxis   Valproic Acid    Levofloxacin Itching   Lab Results:  Results for orders placed or performed during the hospital encounter of 08/27/21 (from the past 48 hour(s))  Comprehensive metabolic panel     Status: None   Collection Time:  08/27/21  4:30 PM  Result Value Ref Range   Sodium 136 135 - 145 mmol/L   Potassium 4.4 3.5 - 5.1 mmol/L   Chloride 101 98 - 111 mmol/L   CO2 27 22 - 32 mmol/L   Glucose, Bld 85 70 - 99 mg/dL    Comment: Glucose reference range applies only to samples taken after fasting for at least 8 hours.   BUN 14 6 - 20 mg/dL   Creatinine, Ser 6.21 0.61 - 1.24 mg/dL   Calcium 9.1 8.9 - 30.8 mg/dL   Total Protein 7.6 6.5 - 8.1 g/dL   Albumin 4.2 3.5 - 5.0 g/dL   AST 33 15 - 41 U/L   ALT 36 0 - 44 U/L   Alkaline Phosphatase 72 38 - 126 U/L   Total Bilirubin 0.6 0.3 - 1.2 mg/dL  GFR, Estimated >60 >60 mL/min    Comment: (NOTE) Calculated using the CKD-EPI Creatinine Equation (2021)    Anion gap 8 5 - 15    Comment: Performed at [facility], [address] [place], [place] [address]  Ethanol     Status: None   Collection Time: 08/27/21  4:30 PM  Result Value Ref Range   Alcohol, Ethyl (B) <10 <10 mg/dL    Comment: (NOTE) Lowest detectable limit for serum alcohol is 10 mg/dL.  For medical purposes only. Performed at [facility], [address] [place], [place] [address]   Salicylate level     Status: None   Collection Time: 08/27/21  4:30 PM  Result Value Ref Range   Salicylate Lvl 11.3 7.0 - 30.0 mg/dL    Comment: Performed at [facility], [address] [place], [place] [address]  Acetaminophen level     Status: Abnormal   Collection Time: 08/27/21  4:30 PM  Result Value Ref Range   Acetaminophen (Tylenol), Serum <10 (L) 10 - 30 ug/mL    Comment: (NOTE) Therapeutic concentrations vary significantly. A range of 10-30 ug/mL  may be an effective concentration for many patients. However, some  are best treated at concentrations outside of this range. Acetaminophen concentrations >150 ug/mL at 4 hours after ingestion  and >50 ug/mL at 12 hours after ingestion are often associated with  toxic reactions.  Performed at [facility], [address] [place], [place] [address]   cbc     Status: Abnormal   Collection Time: 08/27/21  4:30 PM  Result Value Ref Range   WBC 8.0 4.0 - 10.5 K/uL   RBC 4.48 4.22 - 5.81 MIL/uL   Hemoglobin 15.3 13.0 - 17.0 g/dL   HCT 29.5 62.1 - 30.8 %   MCV 95.3 80.0 - 100.0 fL   MCH 34.2 (H) 26.0 - 34.0 pg   MCHC 35.8 30.0 - 36.0 g/dL   RDW 65.7 84.6 - 96.2 %   Platelets 218 150 - 400 K/uL   nRBC 0.0 0.0 - 0.2 %    Comment: Performed at [facility], [address], [place], [place] [address]  Urine Drug Screen, Qualitative     Status: Abnormal   Collection Time: 08/27/21  4:30 PM  Result Value Ref Range   Tricyclic, Ur Screen NONE DETECTED NONE DETECTED   Amphetamines, Ur Screen NONE DETECTED NONE DETECTED   MDMA (Ecstasy)Ur Screen NONE DETECTED NONE DETECTED   Cocaine Metabolite,Ur [place] NONE DETECTED NONE DETECTED   Opiate, Ur Screen NONE DETECTED NONE DETECTED   Phencyclidine (PCP) Ur S NONE DETECTED NONE DETECTED   Cannabinoid 50 Ng, Ur [place] POSITIVE (A) NONE DETECTED   Barbiturates, Ur Screen NONE DETECTED NONE DETECTED   Benzodiazepine, Ur Scrn POSITIVE (A) NONE DETECTED   Methadone Scn, Ur NONE DETECTED NONE DETECTED    Comment: (NOTE) Tricyclics + metabolites, urine    Cutoff 1000 ng/mL Amphetamines + metabolites, urine  Cutoff 1000 ng/mL MDMA (Ecstasy), urine              Cutoff 500 ng/mL Cocaine Metabolite, urine          Cutoff 300 ng/mL Opiate + metabolites, urine        Cutoff 300 ng/mL Phencyclidine (PCP), urine         Cutoff 25 ng/mL Cannabinoid, urine                 Cutoff 50 ng/mL Barbiturates +  metabolites, urine  Cutoff 200 ng/mL Benzodiazepine, urine              Cutoff 200 ng/mL Methadone, urine                   Cutoff 300 ng/mL  The urine drug screen provides only a preliminary, unconfirmed analytical test result and should not be used for non-medical purposes. Clinical consideration and professional judgment should be applied to any positive drug  screen result due to possible interfering substances. A more specific alternate chemical method must be used in order to obtain a confirmed analytical result. Gas chromatography / mass spectrometry (GC/MS) is the preferred confirm atory method. Performed at [facility], [address] [place], [place] [address]   Resp Panel by RT-PCR (Flu A&B, Covid) Nasopharyngeal Swab     Status: None   Collection Time: 08/27/21  7:58 PM   Specimen: Nasopharyngeal Swab; Nasopharyngeal(NP) swabs in vial transport medium  Result Value Ref Range   SARS Coronavirus 2 by RT PCR NEGATIVE NEGATIVE    Comment: (NOTE) SARS-CoV-2 target nucleic acids are NOT DETECTED.  The SARS-CoV-2 RNA is generally detectable in upper respiratory specimens during the acute phase of infection. The lowest concentration of SARS-CoV-2 viral copies this assay can detect is 138 copies/mL. A negative result does not preclude SARS-Cov-2 infection and should not be used as the sole basis for treatment or other patient management decisions. A negative result may occur with  improper specimen collection/handling, submission of specimen other than nasopharyngeal swab, presence of viral mutation(s) within the areas targeted by this assay, and inadequate number of viral copies(<138 copies/mL). A negative result must be combined with clinical observations, patient history, and epidemiological information. The expected result is Negative.  Fact Sheet for Patients:  BloggerCourse.com  Fact Sheet for Healthcare Providers:  SeriousBroker.it  This test is no t yet approved or cleared by the [place] FDA and  has been authorized for detection and/or diagnosis of SARS-CoV-2 by FDA under an Emergency Use Authorization (EUA). This EUA will remain  in effect (meaning this test can be used) for the duration of the COVID-19 declaration under Section 564(b)(1) of the Act,  21 U.S.C.section 360bbb-3(b)(1), unless the authorization is terminated  or revoked sooner.       Influenza A by PCR NEGATIVE NEGATIVE   Influenza B by PCR NEGATIVE NEGATIVE    Comment: (NOTE) The Xpert Xpress SARS-CoV-2/FLU/RSV plus assay is intended as an aid in the diagnosis of influenza from Nasopharyngeal swab specimens and should not be used as a sole basis for treatment. Nasal washings and aspirates are unacceptable for Xpert Xpress SARS-CoV-2/FLU/RSV testing.  Fact Sheet for Patients: BloggerCourse.com  Fact Sheet for Healthcare Providers: SeriousBroker.it  This test is not yet approved or cleared by the [place] FDA and has been authorized for detection and/or diagnosis of SARS-CoV-2 by FDA under an Emergency Use Authorization (EUA). This EUA will remain in effect (meaning this test can be used) for the duration of the COVID-19 declaration under Section 564(b)(1) of the Act, 21 U.S.C. section 360bbb-3(b)(1), unless the authorization is terminated or revoked.  Performed at [facility], [address], [place], [place] [address]     Blood Alcohol level:  Lab Results  Component Value Date   [facility] <10 08/27/2021   ETH 329 (HH) 08/18/2021    Metabolic Disorder Labs:  Lab Results  Component Value Date   HGBA1C 5.2 02/06/2021   MPG 103 02/06/2021  Lab Results  Component Value Date   PROLACTIN 7.0 04/23/2016   Lab Results  Component Value Date   CHOL 139 02/06/2021   TRIG 55 02/06/2021   HDL 55 02/06/2021   CHOLHDL 2.5 02/06/2021   VLDL 11 02/06/2021   LDLCALC 73 02/06/2021   LDLCALC 105 (H) 04/23/2016    Current Medications: Current Facility-Administered Medications  Medication Dose Route Frequency Provider Last Rate Last Admin   acetaminophen (TYLENOL) tablet 650 mg  650 mg Oral Q6H PRN Gillermo Murdoch, NP   650 mg at 08/28/21 0809   alum & mag hydroxide-simeth (MAALOX/MYLANTA)  200-200-20 MG/5ML suspension 30 mL  30 mL Oral Q4H PRN Gillermo Murdoch, NP       FLUoxetine (PROZAC) capsule 20 mg  20 mg Oral QHS Jesse Sans, MD       hydrOXYzine (ATARAX/VISTARIL) tablet 25 mg  25 mg Oral TID PRN Gillermo Murdoch, NP   25 mg at 08/28/21 0809   magnesium hydroxide (MILK OF MAGNESIA) suspension 30 mL  30 mL Oral Daily PRN Gillermo Murdoch, NP       naltrexone (DEPADE) tablet 50 mg  50 mg Oral Daily Les Pou M, MD       OLANZapine [facility]) tablet 5 mg  5 mg Oral QHS Jesse Sans, MD       thiamine tablet 100 mg  100 mg Oral Daily Gillermo Murdoch, NP   100 mg at 08/28/21 0808   PTA Medications: Medications Prior to Admission  Medication Sig Dispense Refill Last Dose   hydrOXYzine (ATARAX/VISTARIL) 25 MG tablet Take 1 tablet (25 mg total) by mouth 3 (three) times daily as needed for anxiety. 21 tablet 0    lamoTRIgine (LAMICTAL) 100 MG tablet Take 1 tablet (100 mg total) by mouth daily. 30 tablet 0    lamoTRIgine (LAMICTAL) 25 MG tablet Take 1 tablet (25 mg total) by mouth daily for 7 days. 7 tablet 0    naltrexone (DEPADE) 50 MG tablet Take 1 tablet (50 mg total) by mouth daily. 7 tablet 0    thiamine (VITAMIN B-1) 100 MG tablet Take 1 tablet (100 mg total) by mouth daily. 7 tablet 0    traZODone (DESYREL) 100 MG tablet Take 1 tablet (100 mg total) by mouth at bedtime as needed for sleep. 7 tablet 0     Musculoskeletal: Strength & Muscle Tone: within normal limits Gait & Station: normal Patient leans: N/A            Psychiatric Specialty Exam:  Presentation  General Appearance: Disheveled  Eye Contact:Poor (wearing a beanie pulled over his eyes)  Speech:Normal Rate  Speech Volume:Normal  Handedness:Right   Mood and Affect  Mood:Anxious; Depressed  Affect:Tearful   Thought Process  Thought Processes:Goal Directed  Duration of Psychotic Symptoms: No data recorded Past Diagnosis of Schizophrenia or Psychoactive  disorder: No  Descriptions of Associations:Intact  Orientation:Full (Time, Place and Person)  Thought Content:Logical  Hallucinations:Hallucinations: None  Ideas of Reference:None  Suicidal Thoughts:Suicidal Thoughts: Yes, Active SI Active Intent and/or Plan: With Intent; Without Plan  Homicidal Thoughts:Homicidal Thoughts: No   Sensorium  Memory:Immediate Fair; Recent Fair; Remote Fair  Judgment:Impaired  Insight:Present   Executive Functions  Concentration:Fair  Attention Span:Fair  Recall:Fair  Fund of Knowledge:Fair  Language:Fair   Psychomotor Activity  Psychomotor Activity:Psychomotor Activity: Decreased   Assets  Assets:Desire for Improvement; Intimacy; Physical Health; Social Support   Sleep  Sleep:Sleep: Fair Number of Hours of Sleep: 5.3    Physical Exam:  Physical Exam Vitals and nursing note reviewed.  Constitutional:      Appearance: Normal appearance.  HENT:     Head: Normocephalic and atraumatic.     Right Ear: External ear normal.     Left Ear: External ear normal.     Nose: Nose normal.     Mouth/Throat:     Mouth: Mucous membranes are moist.     Pharynx: Oropharynx is clear.  Eyes:     Extraocular Movements: Extraocular movements intact.     Conjunctiva/sclera: Conjunctivae normal.     Pupils: Pupils are equal, round, and reactive to light.  Cardiovascular:     Rate and Rhythm: Normal rate.     Pulses: Normal pulses.  Pulmonary:     Effort: Pulmonary effort is normal.     Breath sounds: Normal breath sounds.  Abdominal:     General: Abdomen is flat.     Palpations: Abdomen is soft.  Musculoskeletal:        General: No swelling. Normal range of motion.     Cervical back: Normal range of motion and neck supple.  Skin:    General: Skin is warm and dry.  Neurological:     General: No focal deficit present.     Mental Status: He is alert and oriented to person, place, and time.  Psychiatric:        Attention and  Perception: Attention and perception normal.        Mood and Affect: Mood is depressed. Affect is tearful.        Speech: Speech is rapid and pressured.        Behavior: Behavior is agitated.        Thought Content: Thought content includes suicidal ideation. Thought content includes suicidal plan.        Cognition and Memory: Cognition is impaired. Memory is impaired.        Judgment: Judgment is impulsive.   ROS Blood pressure 123/88, pulse 66, temperature (!) 97.5 F (36.4 C), temperature source Oral, resp. rate 18, height 5\' 9"  (1.753 m), weight 59.9 kg, SpO2 98 %. Body mass index is 19.49 kg/m.  Treatment Plan Summary: Daily contact with patient to assess and evaluate symptoms and progress in treatment and Medication management 35 year old male presenting for bipolar depression and severe alcohol use disorder. Start Olanzapine 5 mg QHS and Prozac 20 mg QHS, titrate to effect. Start Naltrexone 50 mg daily for alcohol use disorder, and he desires to start Vivitrol at discharge.   Observation Level/Precautions:  15 minute checks  Laboratory:   lipid panel and hemoglobin a1c  Psychotherapy:    Medications:    Consultations:    Discharge Concerns:    Estimated LOS:  Other:     Physician Treatment Plan for Primary Diagnosis: Bipolar 1 disorder, depressed (HCC) Long Term Goal(s): Improvement in symptoms so as ready for discharge  Short Term Goals: Ability to identify changes in lifestyle to reduce recurrence of condition will improve, Ability to verbalize feelings will improve, Ability to disclose and discuss suicidal ideas, Ability to demonstrate self-control will improve, Ability to identify and develop effective coping behaviors will improve, Ability to maintain clinical measurements within normal limits will improve, Compliance with prescribed medications will improve, and Ability to identify triggers associated with substance abuse/mental health issues will improve  Physician  Treatment Plan for Secondary Diagnosis: Principal Problem:   Bipolar 1 disorder, depressed (HCC) Active Problems:   Alcohol use disorder, severe, dependence (HCC)  Long Term Goal(s):  Improvement in symptoms so as ready for discharge  Short Term Goals: Ability to identify changes in lifestyle to reduce recurrence of condition will improve, Ability to verbalize feelings will improve, Ability to disclose and discuss suicidal ideas, Ability to demonstrate self-control will improve, Ability to identify and develop effective coping behaviors will improve, Ability to maintain clinical measurements within normal limits will improve, Compliance with prescribed medications will improve, and Ability to identify triggers associated with substance abuse/mental health issues will improve  I certify that inpatient services furnished can reasonably be expected to improve the patient's condition.    Jesse Sans, MD 9/9/202211:12 AM

## 2021-08-28 NOTE — Plan of Care (Signed)
New Admission Problem: Education: Goal: Knowledge of [facility] Education information/materials will improve Outcome: Progressing Goal: Emotional status will improve Outcome: Progressing Goal: Mental status will improve Outcome: Progressing Goal: Verbalization of understanding the information provided will improve Outcome: Progressing   Problem: Activity: Goal: Interest or engagement in activities will improve Outcome: Progressing Goal: Sleeping patterns will improve Outcome: Progressing   Problem: Coping: Goal: Ability to verbalize frustrations and anger appropriately will improve Outcome: Progressing Goal: Ability to demonstrate self-control will improve Outcome: Progressing   Problem: Health Behavior/Discharge Planning: Goal: Identification of resources available to assist in meeting health care needs will improve Outcome: Progressing Goal: Compliance with treatment plan for underlying cause of condition will improve Outcome: Progressing   Problem: Physical Regulation: Goal: Ability to maintain clinical measurements within normal limits will improve Outcome: Progressing   Problem: Safety: Goal: Periods of time without injury will increase Outcome: Progressing   Problem: Education: Goal: Utilization of techniques to improve thought processes will improve Outcome: Progressing Goal: Knowledge of the prescribed therapeutic regimen will improve Outcome: Progressing   Problem: Activity: Goal: Interest or engagement in leisure activities will improve Outcome: Progressing Goal: Imbalance in normal sleep/wake cycle will improve Outcome: Progressing   Problem: Coping: Goal: Coping ability will improve Outcome: Progressing Goal: Will verbalize feelings Outcome: Progressing   Problem: Health Behavior/Discharge Planning: Goal: Ability to make decisions will improve Outcome: Progressing Goal: Compliance with therapeutic regimen will improve Outcome:  Progressing   Problem: Role Relationship: Goal: Will demonstrate positive changes in social behaviors and relationships Outcome: Progressing   Problem: Safety: Goal: Ability to disclose and discuss suicidal ideas will improve Outcome: Progressing Goal: Ability to identify and utilize support systems that promote safety will improve Outcome: Progressing   Problem: Self-Concept: Goal: Will verbalize positive feelings about self Outcome: Progressing Goal: Level of anxiety will decrease Outcome: Progressing   Problem: Education: Goal: Ability to state activities that reduce stress will improve Outcome: Progressing   Problem: Coping: Goal: Ability to identify and develop effective coping behavior will improve Outcome: Progressing   Problem: Self-Concept: Goal: Ability to identify factors that promote anxiety will improve Outcome: Progressing Goal: Level of anxiety will decrease Outcome: Progressing Goal: Ability to modify response to factors that promote anxiety will improve Outcome: Progressing   Problem: Education: Goal: Ability to make informed decisions regarding treatment will improve Outcome: Progressing   Problem: Coping: Goal: Coping ability will improve Outcome: Progressing   Problem: Health Behavior/Discharge Planning: Goal: Identification of resources available to assist in meeting health care needs will improve Outcome: Progressing   Problem: Medication: Goal: Compliance with prescribed medication regimen will improve Outcome: Progressing   Problem: Self-Concept: Goal: Ability to disclose and discuss suicidal ideas will improve Outcome: Progressing Goal: Will verbalize positive feelings about self Outcome: Progressing
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
## 2021-08-28 NOTE — [facility] Counselor (Signed)
CSW attempted to complete PSA with the patient.  Patient stated that he would like for CSW to come back later.  Penni Homans, MSW, LCSW 08/28/2021 10:29 AM
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
## 2021-08-28 NOTE — BH IP Treatment Plan (Signed)
Interdisciplinary Treatment and Diagnostic Plan Update  08/28/2021 Time of Session: 9;00AM Dywane Peruski MRN: [id number]  Principal Diagnosis: <principal problem not specified>  Secondary Diagnoses: Active Problems:   Bipolar 1 disorder (HCC)   Current Medications:  Current Facility-Administered Medications  Medication Dose Route Frequency Provider Last Rate Last Admin   acetaminophen (TYLENOL) tablet 650 mg  650 mg Oral Q6H PRN Caroline Sauger, NP   650 mg at 08/28/21 0809   alum & mag hydroxide-simeth (MAALOX/MYLANTA) 200-200-20 MG/5ML suspension 30 mL  30 mL Oral Q4H PRN Caroline Sauger, NP       hydrOXYzine (ATARAX/VISTARIL) tablet 25 mg  25 mg Oral TID PRN Caroline Sauger, NP   25 mg at 08/28/21 0809   lamoTRIgine (LAMICTAL) tablet 25 mg  25 mg Oral Daily Caroline Sauger, NP       magnesium hydroxide (MILK OF MAGNESIA) suspension 30 mL  30 mL Oral Daily PRN Caroline Sauger, NP       QUEtiapine (SEROQUEL) tablet 100 mg  100 mg Oral QHS Caroline Sauger, NP       thiamine tablet 100 mg  100 mg Oral Daily Caroline Sauger, NP   100 mg at 08/28/21 0808   PTA Medications: Medications Prior to Admission  Medication Sig Dispense Refill Last Dose   hydrOXYzine (ATARAX/VISTARIL) 25 MG tablet Take 1 tablet (25 mg total) by mouth 3 (three) times daily as needed for anxiety. 21 tablet 0    lamoTRIgine (LAMICTAL) 100 MG tablet Take 1 tablet (100 mg total) by mouth daily. 30 tablet 0    lamoTRIgine (LAMICTAL) 25 MG tablet Take 1 tablet (25 mg total) by mouth daily for 7 days. 7 tablet 0    naltrexone (DEPADE) 50 MG tablet Take 1 tablet (50 mg total) by mouth daily. 7 tablet 0    thiamine (VITAMIN B-1) 100 MG tablet Take 1 tablet (100 mg total) by mouth daily. 7 tablet 0    traZODone (DESYREL) 100 MG tablet Take 1 tablet (100 mg total) by mouth at bedtime as needed for sleep. 7 tablet 0     Patient Stressors: Financial difficulties   Legal issue   Loss  of family support   Marital or family conflict   Substance abuse    Patient Strengths: Capable of independent living  Communication skills  Other: Relationship with son  Treatment Modalities: Medication Management, Group therapy, Case management,  1 to 1 session with clinician, Psychoeducation, Recreational therapy.   Physician Treatment Plan for Primary Diagnosis: <principal problem not specified> Long Term Goal(s):     Short Term Goals:    Medication Management: Evaluate patient's response, side effects, and tolerance of medication regimen.  Therapeutic Interventions: 1 to 1 sessions, Unit Group sessions and Medication administration.  Evaluation of Outcomes: Not Met  Physician Treatment Plan for Secondary Diagnosis: Active Problems:   Bipolar 1 disorder ([place])  Long Term Goal(s):     Short Term Goals:       Medication Management: Evaluate patient's response, side effects, and tolerance of medication regimen.  Therapeutic Interventions: 1 to 1 sessions, Unit Group sessions and Medication administration.  Evaluation of Outcomes: Not Met   RN Treatment Plan for Primary Diagnosis: <principal problem not specified> Long Term Goal(s): Knowledge of disease and therapeutic regimen to maintain health will improve  Short Term Goals: Ability to remain free from injury will improve, Ability to verbalize feelings will improve, Ability to identify and develop effective coping behaviors will improve, and Compliance with prescribed medications will  improve  Medication Management: RN will administer medications as ordered by provider, will assess and evaluate patient's response and provide education to patient for prescribed medication. RN will report any adverse and/or side effects to prescribing provider.  Therapeutic Interventions: 1 on 1 counseling sessions, Psychoeducation, Medication administration, Evaluate responses to treatment, Monitor vital signs and CBGs as ordered,  Perform/monitor CIWA, COWS, AIMS and Fall Risk screenings as ordered, Perform wound care treatments as ordered.  Evaluation of Outcomes: Not Met   LCSW Treatment Plan for Primary Diagnosis: <principal problem not specified> Long Term Goal(s): Safe transition to appropriate next level of care at discharge, Engage patient in therapeutic group addressing interpersonal concerns.  Short Term Goals: Engage patient in aftercare planning with referrals and resources, Increase social support, Facilitate acceptance of mental health diagnosis and concerns, Facilitate patient progression through stages of change regarding substance use diagnoses and concerns, Identify triggers associated with mental health/substance abuse issues, and Increase skills for wellness and recovery  Therapeutic Interventions: Assess for all discharge needs, 1 to 1 time with Social worker, Explore available resources and support systems, Assess for adequacy in community support network, Educate family and significant other(s) on suicide prevention, Complete Psychosocial Assessment, Interpersonal group therapy.  Evaluation of Outcomes: Not Met   Progress in Treatment: Attending groups: No. Participating in groups: No. Taking medication as prescribed: Yes. Toleration medication: Yes. Family/Significant other contact made: No, will contact:  when given permission. Patient understands diagnosis: Yes. Discussing patient identified problems/goals with staff: No. Medical problems stabilized or resolved: Yes. Denies suicidal/homicidal ideation: Yes. Issues/concerns per patient self-inventory: No. Other: None  New problem(s) identified: No, Describe:  none.  New Short Term/Long Term Goal(s): detox, medication management for mood stabilization; elimination of SI thoughts; development of comprehensive mental wellness/sobriety plan.  Patient Goals: Pt declined to meet for treatment team meeting although personal invitation was  extended.   Discharge Plan or Barriers: CSW will assist pt with development of an appropriate aftercare/discharge plan.   Reason for Continuation of Hospitalization: Aggression Medication stabilization  Estimated Length of Stay: 1-7 days   Scribe for Treatment Team: Shirl Harris, LCSW 08/28/2021 9:39 AM

## 2021-08-29 DIAGNOSIS — F319 Bipolar disorder, unspecified: Secondary | ICD-10-CM | POA: Diagnosis not present

## 2021-08-29 MED ORDER — OLANZAPINE 10 MG PO TABS
10.0000 mg | ORAL_TABLET | Freq: Four times a day (QID) | ORAL | Status: DC | PRN
  Administered 2021-08-29 – 2021-09-06 (×5): 10 mg via ORAL
  Filled 2021-08-29 (×5): qty 1

## 2021-08-29 MED ORDER — ZIPRASIDONE MESYLATE 20 MG IM SOLR: 20.0000 mg | Freq: Four times a day (QID) | INTRAMUSCULAR | Status: DC | PRN

## 2021-08-29 MED ORDER — OLANZAPINE 10 MG IM SOLR: 10.0000 mg | Freq: Four times a day (QID) | INTRAMUSCULAR | Status: DC | PRN

## 2021-08-29 NOTE — Progress Notes (Signed)
Pt angry and upset this morning, when approached in the am. He said he could not sleep last night because the bed was uncomfortable. His mattress was on the floor. He stated, " get the fuck out of my room, I don't want to talk, I'm tired". He talked with another nurse about the chaos at home and the various on going conflicts. Later in the shift, he has been up, pacing in the unit with another pt, talking, laughing and joking around. He apologized for his angry outburst earlier and stated he "just wanted to spend time with his son". He then became tearful, started loud talking, becoming angry and walked away from me. Another nurse talked to him and he was able to come done. He has been calmer and more approachable this afternoon. Affect sad, he seems helpless, plus  verbalizing a lot of hostility/frustrate about his home life. He denies SI/HI or AV hallucinations.

## 2021-08-29 NOTE — Progress Notes (Signed)
Patient has been silly, socializing with same-age peer. Proudly declared "I didn't get in here because I was drunk this time, I got in here for being stupid". Denies SI, HI and AVH

## 2021-08-29 NOTE — Plan of Care (Signed)
  Problem: Education: Goal: Knowledge of [facility] Education information/materials will improve Outcome: Progressing Goal: Emotional status will improve Outcome: Progressing Goal: Mental status will improve Outcome: Progressing Goal: Verbalization of understanding the information provided will improve Outcome: Progressing   Problem: Activity: Goal: Interest or engagement in activities will improve Outcome: Progressing Goal: Sleeping patterns will improve Outcome: Progressing   Problem: Coping: Goal: Ability to verbalize frustrations and anger appropriately will improve Outcome: Progressing Goal: Ability to demonstrate self-control will improve Outcome: Progressing   Problem: Health Behavior/Discharge Planning: Goal: Identification of resources available to assist in meeting health care needs will improve Outcome: Progressing Goal: Compliance with treatment plan for underlying cause of condition will improve Outcome: Progressing   Problem: Physical Regulation: Goal: Ability to maintain clinical measurements within normal limits will improve Outcome: Progressing   Problem: Safety: Goal: Periods of time without injury will increase Outcome: Progressing   Problem: Education: Goal: Utilization of techniques to improve thought processes will improve Outcome: Progressing Goal: Knowledge of the prescribed therapeutic regimen will improve Outcome: Progressing   Problem: Activity: Goal: Interest or engagement in leisure activities will improve Outcome: Progressing Goal: Imbalance in normal sleep/wake cycle will improve Outcome: Progressing   Problem: Coping: Goal: Coping ability will improve Outcome: Progressing Goal: Will verbalize feelings Outcome: Progressing   Problem: Health Behavior/Discharge Planning: Goal: Ability to make decisions will improve Outcome: Progressing Goal: Compliance with therapeutic regimen will improve Outcome: Progressing    Problem: Role Relationship: Goal: Will demonstrate positive changes in social behaviors and relationships Outcome: Progressing   Problem: Safety: Goal: Ability to disclose and discuss suicidal ideas will improve Outcome: Progressing Goal: Ability to identify and utilize support systems that promote safety will improve Outcome: Progressing   Problem: Self-Concept: Goal: Will verbalize positive feelings about self Outcome: Progressing Goal: Level of anxiety will decrease Outcome: Progressing   Problem: Education: Goal: Ability to state activities that reduce stress will improve Outcome: Progressing   Problem: Coping: Goal: Ability to identify and develop effective coping behavior will improve Outcome: Progressing   Problem: Self-Concept: Goal: Ability to identify factors that promote anxiety will improve Outcome: Progressing Goal: Level of anxiety will decrease Outcome: Progressing Goal: Ability to modify response to factors that promote anxiety will improve Outcome: Progressing   Problem: Education: Goal: Ability to make informed decisions regarding treatment will improve Outcome: Progressing   Problem: Coping: Goal: Coping ability will improve Outcome: Progressing   Problem: Health Behavior/Discharge Planning: Goal: Identification of resources available to assist in meeting health care needs will improve Outcome: Progressing   Problem: Medication: Goal: Compliance with prescribed medication regimen will improve Outcome: Progressing   Problem: Self-Concept: Goal: Ability to disclose and discuss suicidal ideas will improve Outcome: Progressing Goal: Will verbalize positive feelings about self Outcome: Progressing   

## 2021-08-29 NOTE — Group Note (Signed)
LCSW Group Therapy Note  Group Date: 08/29/2021 Start Time: 1300 End Time: 1400   Type of Therapy and Topic:  Group Therapy - How To Cope with Nervousness about Discharge   Participation Level:  Did Not Attend   Description of Group This process group involved identification of patients' feelings about discharge. Some of them are scheduled to be discharged soon, while others are new admissions, but each of them was asked to share thoughts and feelings surrounding discharge from the hospital. One common theme was that they are excited at the prospect of going home, while another was that many of them are apprehensive about sharing why they were hospitalized. Patients were given the opportunity to discuss these feelings with their peers in preparation for discharge.  Therapeutic Goals  Patient will identify their overall feelings about pending discharge. Patient will think about how they might proactively address issues that they believe will once again arise once they get home (i.e. with parents). Patients will participate in discussion about having hope for change.   Summary of Patient Progress:  Patient did not attend group despite encouraged participation.    Therapeutic Modalities Cognitive Behavioral Therapy   Almedia Balls 08/29/2021  3:56 PM

## 2021-08-29 NOTE — Plan of Care (Signed)
  Problem: Education: Goal: Knowledge of [facility] Education information/materials will improve Outcome: Progressing Goal: Emotional status will improve Outcome: Progressing Goal: Mental status will improve Outcome: Progressing Goal: Verbalization of understanding the information provided will improve Outcome: Progressing   Problem: Activity: Goal: Interest or engagement in activities will improve Outcome: Progressing Goal: Sleeping patterns will improve Outcome: Progressing   Problem: Coping: Goal: Ability to verbalize frustrations and anger appropriately will improve Outcome: Progressing Goal: Ability to demonstrate self-control will improve Outcome: Progressing   Problem: Health Behavior/Discharge Planning: Goal: Identification of resources available to assist in meeting health care needs will improve Outcome: Progressing Goal: Compliance with treatment plan for underlying cause of condition will improve Outcome: Progressing   Problem: Physical Regulation: Goal: Ability to maintain clinical measurements within normal limits will improve Outcome: Progressing   Problem: Safety: Goal: Periods of time without injury will increase Outcome: Progressing   Problem: Education: Goal: Utilization of techniques to improve thought processes will improve Outcome: Progressing Goal: Knowledge of the prescribed therapeutic regimen will improve Outcome: Progressing   Problem: Activity: Goal: Interest or engagement in leisure activities will improve Outcome: Progressing Goal: Imbalance in normal sleep/wake cycle will improve Outcome: Progressing   Problem: Coping: Goal: Coping ability will improve Outcome: Progressing Goal: Will verbalize feelings Outcome: Progressing   Problem: Health Behavior/Discharge Planning: Goal: Ability to make decisions will improve Outcome: Progressing Goal: Compliance with therapeutic regimen will improve Outcome: Progressing    Problem: Role Relationship: Goal: Will demonstrate positive changes in social behaviors and relationships Outcome: Progressing   Problem: Safety: Goal: Ability to disclose and discuss suicidal ideas will improve Outcome: Progressing Goal: Ability to identify and utilize support systems that promote safety will improve Outcome: Progressing   Problem: Self-Concept: Goal: Will verbalize positive feelings about self Outcome: Progressing Goal: Level of anxiety will decrease Outcome: Progressing   Problem: Education: Goal: Ability to state activities that reduce stress will improve Outcome: Progressing   Problem: Coping: Goal: Ability to identify and develop effective coping behavior will improve Outcome: Progressing   Problem: Self-Concept: Goal: Ability to identify factors that promote anxiety will improve Outcome: Progressing Goal: Level of anxiety will decrease Outcome: Progressing Goal: Ability to modify response to factors that promote anxiety will improve Outcome: Progressing   Problem: Education: Goal: Ability to make informed decisions regarding treatment will improve Outcome: Progressing   Problem: Coping: Goal: Coping ability will improve Outcome: Progressing   Problem: Health Behavior/Discharge Planning: Goal: Identification of resources available to assist in meeting health care needs will improve Outcome: Progressing   Problem: Medication: Goal: Compliance with prescribed medication regimen will improve Outcome: Progressing   Problem: Self-Concept: Goal: Ability to disclose and discuss suicidal ideas will improve Outcome: Progressing Goal: Will verbalize positive feelings about self Outcome: Progressing   

## 2021-08-29 NOTE — Progress Notes (Signed)
[facility] MD Progress Note  08/29/2021 10:00 AM Nathan [name]  MRN:  [id number]  CC "This day has gone to sh**."  Subjective:  Nathan [name] is a 35 year old male with a history of bipolar I disorder and substance use disorder who called 911 to be brought to the ED because he was feeling suicidal. No acute events overnight, medication compliant, attending to ADLs. This morning patient is extremely upset because he has not been able to speak to his son or his girlfriend. He remains uncertain about how his custody arrangement will work after discharge now that his mother has taken out a restraining order against him. He has some suicidal ideations due to this situation. He denies any HI/AH/VH. Denies any medication side effects. He asks for me to call Toniann Fail again and to provide his security code so that she can call back.   Contacted Toniann Fail again at [phone]. No answer, voicemail left providing security code and call back number per patient request.   Principal Problem: Bipolar 1 disorder, depressed (HCC) Diagnosis: Principal Problem:   Bipolar 1 disorder, depressed (HCC) Active Problems:   Alcohol use disorder, severe, dependence (HCC)  Total Time spent with patient: 30 minutes  Past Psychiatric History: See H&P  Past Medical History:  Past Medical History:  Diagnosis Date   Anxiety    Bipolar 1 disorder (HCC)    Chronic headache    Depression 03/30/2020   ETOH abuse 03/30/2020   Hep C w/o coma, chronic (HCC)    Heroin addiction (HCC)    Horseshoe kidney    Marijuana smoker 03/30/2020    Past Surgical History:  Procedure Laterality Date   TONSILLECTOMY     Family History:  Family History  Problem Relation Age of Onset   Hypertension Mother    Diabetes Mother    Hypertension Father    Heart failure Father    Diabetes Father    Family Psychiatric  History: See H&P Social History:  Social History   Substance and Sexual Activity  Alcohol Use Yes   Alcohol/week: 24.0  standard drinks   Types: 24 Cans of beer per week   Comment: 4 gallons a day- pt reports more than this currently     Social History   Substance and Sexual Activity  Drug Use Not Currently   Types: Marijuana, Cocaine   Comment: Heroin addiction- denies current drug use    Social History   Socioeconomic History   Marital status: Single    Spouse name: Not on file   Number of children: 1   Years of education: 11   Highest education level: 11th grade  Occupational History   Not on file  Tobacco Use   Smoking status: Every Day    Packs/day: 1.00    Types: Cigarettes   Smokeless tobacco: Current    Types: Chew  Vaping Use   Vaping Use: Never used  Substance and Sexual Activity   Alcohol use: Yes    Alcohol/week: 24.0 standard drinks    Types: 24 Cans of beer per week    Comment: 4 gallons a day- pt reports more than this currently   Drug use: Not Currently    Types: Marijuana, Cocaine    Comment: Heroin addiction- denies current drug use   Sexual activity: Not Currently  Other Topics Concern   Not on file  Social History Narrative   Not on file   Social Determinants of Health   Financial Resource Strain: Not on file  Food  Insecurity: Not on file  Transportation Needs: Not on file  Physical Activity: Not on file  Stress: Not on file  Social Connections: Not on file   Additional Social History:                         Sleep: Poor  Appetite:  Fair  Current Medications: Current Facility-Administered Medications  Medication Dose Route Frequency Provider Last Rate Last Admin   acetaminophen (TYLENOL) tablet 650 mg  650 mg Oral Q6H PRN Gillermo Murdoch, NP   650 mg at 08/28/21 2103   alum & mag hydroxide-simeth (MAALOX/MYLANTA) 200-200-20 MG/5ML suspension 30 mL  30 mL Oral Q4H PRN Gillermo Murdoch, NP       FLUoxetine (PROZAC) capsule 20 mg  20 mg Oral QHS Jesse Sans, MD   20 mg at 08/28/21 2102   hydrOXYzine (ATARAX/VISTARIL) tablet 25 mg   25 mg Oral TID PRN Gillermo Murdoch, NP   25 mg at 08/28/21 1454   magnesium hydroxide (MILK OF MAGNESIA) suspension 30 mL  30 mL Oral Daily PRN Gillermo Murdoch, NP       naltrexone (DEPADE) tablet 50 mg  50 mg Oral Daily Jesse Sans, MD   50 mg at 08/28/21 1403   nicotine polacrilex (NICORETTE) gum 2 mg  2 mg Oral Q4H PRN Jesse Sans, MD   2 mg at 08/28/21 2102   OLANZapine (ZYPREXA) tablet 5 mg  5 mg Oral QHS Jesse Sans, MD   5 mg at 08/28/21 2102   thiamine tablet 100 mg  100 mg Oral Daily Gillermo Murdoch, NP   100 mg at 08/28/21 5366    Lab Results:  Results for orders placed or performed during the hospital encounter of 08/27/21 (from the past 48 hour(s))  Comprehensive metabolic panel     Status: None   Collection Time: 08/27/21  4:30 PM  Result Value Ref Range   Sodium 136 135 - 145 mmol/L   Potassium 4.4 3.5 - 5.1 mmol/L   Chloride 101 98 - 111 mmol/L   CO2 27 22 - 32 mmol/L   Glucose, Bld 85 70 - 99 mg/dL    Comment: Glucose reference range applies only to samples taken after fasting for at least 8 hours.   BUN 14 6 - 20 mg/dL   Creatinine, Ser 4.40 0.61 - 1.24 mg/dL   Calcium 9.1 8.9 - 34.7 mg/dL   Total Protein 7.6 6.5 - 8.1 g/dL   Albumin 4.2 3.5 - 5.0 g/dL   AST 33 15 - 41 U/L   ALT 36 0 - 44 U/L   Alkaline Phosphatase 72 38 - 126 U/L   Total Bilirubin 0.6 0.3 - 1.2 mg/dL   GFR, Estimated >42 >59 mL/min    Comment: (NOTE) Calculated using the CKD-EPI Creatinine Equation (2021)    Anion gap 8 5 - 15    Comment: Performed at [facility], [address] [place], [place] [address]  Ethanol     Status: None   Collection Time: 08/27/21  4:30 PM  Result Value Ref Range   Alcohol, Ethyl (B) <10 <10 mg/dL    Comment: (NOTE) Lowest detectable limit for serum alcohol is 10 mg/dL.  For medical purposes only. Performed at [facility], [address] [place], [place] [address]   Salicylate level     Status: None    Collection Time: 08/27/21  4:30 PM  Result Value Ref Range  Salicylate Lvl 11.3 7.0 - 30.0 mg/dL    Comment: Performed at [facility], [address] [place], [place] [address]  Acetaminophen level     Status: Abnormal   Collection Time: 08/27/21  4:30 PM  Result Value Ref Range   Acetaminophen (Tylenol), Serum <10 (L) 10 - 30 ug/mL    Comment: (NOTE) Therapeutic concentrations vary significantly. A range of 10-30 ug/mL  may be an effective concentration for many patients. However, some  are best treated at concentrations outside of this range. Acetaminophen concentrations >150 ug/mL at 4 hours after ingestion  and >50 ug/mL at 12 hours after ingestion are often associated with  toxic reactions.  Performed at [facility], [address] [place], [place] [address]   cbc     Status: Abnormal   Collection Time: 08/27/21  4:30 PM  Result Value Ref Range   WBC 8.0 4.0 - 10.5 K/uL   RBC 4.48 4.22 - 5.81 MIL/uL   Hemoglobin 15.3 13.0 - 17.0 g/dL   HCT 40.1 02.7 - 25.3 %   MCV 95.3 80.0 - 100.0 fL   MCH 34.2 (H) 26.0 - 34.0 pg   MCHC 35.8 30.0 - 36.0 g/dL   RDW 66.4 40.3 - 47.4 %   Platelets 218 150 - 400 K/uL   nRBC 0.0 0.0 - 0.2 %    Comment: Performed at [facility], [address] [place], [place] [address]  Urine Drug Screen, Qualitative     Status: Abnormal   Collection Time: 08/27/21  4:30 PM  Result Value Ref Range   Tricyclic, Ur Screen NONE DETECTED NONE DETECTED   Amphetamines, Ur Screen NONE DETECTED NONE DETECTED   MDMA (Ecstasy)Ur Screen NONE DETECTED NONE DETECTED   Cocaine Metabolite,Ur Box NONE DETECTED NONE DETECTED   Opiate, Ur Screen NONE DETECTED NONE DETECTED   Phencyclidine (PCP) Ur S NONE DETECTED NONE DETECTED   Cannabinoid 50 Ng, Ur [place] POSITIVE (A) NONE DETECTED   Barbiturates, Ur Screen NONE DETECTED NONE DETECTED   Benzodiazepine, Ur Scrn POSITIVE (A) NONE DETECTED   Methadone Scn, Ur NONE DETECTED NONE DETECTED     Comment: (NOTE) Tricyclics + metabolites, urine    Cutoff 1000 ng/mL Amphetamines + metabolites, urine  Cutoff 1000 ng/mL MDMA (Ecstasy), urine              Cutoff 500 ng/mL Cocaine Metabolite, urine          Cutoff 300 ng/mL Opiate + metabolites, urine        Cutoff 300 ng/mL Phencyclidine (PCP), urine         Cutoff 25 ng/mL Cannabinoid, urine                 Cutoff 50 ng/mL Barbiturates + metabolites, urine  Cutoff 200 ng/mL Benzodiazepine, urine              Cutoff 200 ng/mL Methadone, urine                   Cutoff 300 ng/mL  The urine drug screen provides only a preliminary, unconfirmed analytical test result and should not be used for non-medical purposes. Clinical consideration and professional judgment should be applied to any positive drug screen result due to possible interfering substances. A more specific alternate chemical method must be used in order to obtain a confirmed analytical result. Gas chromatography / mass spectrometry (GC/MS) is the preferred confirm atory method. Performed at [facility], [address] [place],  [place] [address]   Resp Panel by RT-PCR (Flu A&B, Covid) Nasopharyngeal Swab     Status: None   Collection Time: 08/27/21  7:58 PM   Specimen: Nasopharyngeal Swab; Nasopharyngeal(NP) swabs in vial transport medium  Result Value Ref Range   SARS Coronavirus 2 by RT PCR NEGATIVE NEGATIVE    Comment: (NOTE) SARS-CoV-2 target nucleic acids are NOT DETECTED.  The SARS-CoV-2 RNA is generally detectable in upper respiratory specimens during the acute phase of infection. The lowest concentration of SARS-CoV-2 viral copies this assay can detect is 138 copies/mL. A negative result does not preclude SARS-Cov-2 infection and should not be used as the sole basis for treatment or other patient management decisions. A negative result may occur with  improper specimen collection/handling, submission of specimen other than nasopharyngeal swab,  presence of viral mutation(s) within the areas targeted by this assay, and inadequate number of viral copies(<138 copies/mL). A negative result must be combined with clinical observations, patient history, and epidemiological information. The expected result is Negative.  Fact Sheet for Patients:  BloggerCourse.com  Fact Sheet for Healthcare Providers:  SeriousBroker.it  This test is no t yet approved or cleared by the [place] FDA and  has been authorized for detection and/or diagnosis of SARS-CoV-2 by FDA under an Emergency Use Authorization (EUA). This EUA will remain  in effect (meaning this test can be used) for the duration of the COVID-19 declaration under Section 564(b)(1) of the Act, 21 U.S.C.section 360bbb-3(b)(1), unless the authorization is terminated  or revoked sooner.       Influenza A by PCR NEGATIVE NEGATIVE   Influenza B by PCR NEGATIVE NEGATIVE    Comment: (NOTE) The Xpert Xpress SARS-CoV-2/FLU/RSV plus assay is intended as an aid in the diagnosis of influenza from Nasopharyngeal swab specimens and should not be used as a sole basis for treatment. Nasal washings and aspirates are unacceptable for Xpert Xpress SARS-CoV-2/FLU/RSV testing.  Fact Sheet for Patients: BloggerCourse.com  Fact Sheet for Healthcare Providers: SeriousBroker.it  This test is not yet approved or cleared by the [place] FDA and has been authorized for detection and/or diagnosis of SARS-CoV-2 by FDA under an Emergency Use Authorization (EUA). This EUA will remain in effect (meaning this test can be used) for the duration of the COVID-19 declaration under Section 564(b)(1) of the Act, 21 U.S.C. section 360bbb-3(b)(1), unless the authorization is terminated or revoked.  Performed at [facility], [address] [place], [place] [address]     Blood Alcohol level:   Lab Results  Component Value Date   ETH <10 08/27/2021   ETH 329 (HH) 08/18/2021    Metabolic Disorder Labs: Lab Results  Component Value Date   HGBA1C 5.2 02/06/2021   MPG 103 02/06/2021   Lab Results  Component Value Date   PROLACTIN 7.0 04/23/2016   Lab Results  Component Value Date   CHOL 139 02/06/2021   TRIG 55 02/06/2021   HDL 55 02/06/2021   CHOLHDL 2.5 02/06/2021   VLDL 11 02/06/2021   LDLCALC 73 02/06/2021   LDLCALC 105 (H) 04/23/2016    Physical Findings: AIMS:  , ,  ,  ,    CIWA:    COWS:     Musculoskeletal: Strength & Muscle Tone: within normal limits Gait & Station: normal Patient leans: N/A  Psychiatric Specialty Exam:  Presentation  General Appearance: Disheveled  Eye Contact:Poor (wearing a beanie pulled over his eyes)  Speech:Normal Rate  Speech Volume:Normal  Handedness:Right   Mood and  Affect  Mood:Anxious; Depressed  Affect:Tearful   Thought Process  Thought Processes:Goal Directed  Descriptions of Associations:Intact  Orientation:Full (Time, Place and Person)  Thought Content:Logical  History of Schizophrenia/Schizoaffective disorder:No  Duration of Psychotic Symptoms:No data recorded Hallucinations:Hallucinations: None  Ideas of Reference:None  Suicidal Thoughts:Suicidal Thoughts: Yes, Active  Homicidal Thoughts:Homicidal Thoughts: No   Sensorium  Memory:Immediate Fair; Recent Fair; Remote Fair  Judgment:Impaired  Insight:Present   Executive Functions  Concentration:Fair  Attention Span:Fair  Recall:Fair  Fund of Knowledge:Fair  Language:Fair   Psychomotor Activity  Psychomotor Activity:Psychomotor Activity: Decreased   Assets  Assets:Desire for Improvement; Intimacy; Physical Health; Social Support   Sleep  Sleep:Sleep: Fair Number of Hours of Sleep: 5.3    Physical Exam: Physical Exam ROS Blood pressure 115/86, pulse (!) 50, temperature 97.6 F (36.4 C), temperature source  Oral, resp. rate 18, height 5\' 9"  (1.753 m), weight 59.9 kg, SpO2 97 %. Body mass index is 19.49 kg/m.   Treatment Plan Summary: Daily contact with patient to assess and evaluate symptoms and progress in treatment and Medication management35 year ol29d male presenting for bipolar depression and severe alcohol use disorder. Continue Olanzapine 5 mg QHS and Prozac 20 mg QHS, titrate to effect. Start Naltrexone 50 mg daily for alcohol use disorder, and he desires to start Vivitrol at discharge.   Jesse SansMegan M Lutie Pickler, MD 08/29/2021, 10:00 AM

## 2021-08-29 NOTE — [facility] Counselor (Signed)
CSW received call from Dora, who described herself as the patient's children's mother, reports that she has a 50B restraining order in place. Patient is not to call or be discharged from the hospital to their address. CSW notified physician of call.   Signed:  [name], MSW, [place], LCASA 08/29/2021 10:13 AM

## 2021-08-30 DIAGNOSIS — F319 Bipolar disorder, unspecified: Secondary | ICD-10-CM | POA: Diagnosis not present

## 2021-08-30 MED ORDER — OLANZAPINE 10 MG PO TABS
10.0000 mg | ORAL_TABLET | Freq: Every day | ORAL | Status: DC
  Administered 2021-08-30 – 2021-09-02 (×4): 10 mg via ORAL
  Filled 2021-08-30 (×4): qty 1

## 2021-08-30 MED ORDER — HYDROXYZINE HCL 50 MG PO TABS
50.0000 mg | ORAL_TABLET | Freq: Once | ORAL | Status: DC
Start: 2021-08-30 — End: 2021-09-08
  Filled 2021-08-30: qty 1

## 2021-08-30 MED ORDER — HYDROXYZINE HCL 50 MG PO TABS: 100.0000 mg | ORAL_TABLET | Freq: Once | ORAL | Status: DC

## 2021-08-30 MED ORDER — HYDROXYZINE HCL 50 MG PO TABS
50.0000 mg | ORAL_TABLET | Freq: Three times a day (TID) | ORAL | Status: DC | PRN
  Administered 2021-08-30 – 2021-09-08 (×16): 50 mg via ORAL
  Filled 2021-08-30 (×16): qty 1

## 2021-08-30 NOTE — Progress Notes (Signed)
Patient has been silly and hyperactive. Anxious at times. Restless. Received prn medication for anxiety. Denies SI, HI and AVH

## 2021-08-30 NOTE — Progress Notes (Signed)
[facility] MD Progress Note  08/30/2021 11:16 AM Nathan [name]  MRN:  [id number]  CC "Ain't nothing go my way."  Subjective:  Nathan [name] is a 35 year old male with a history of bipolar I disorder and substance use disorder who called 911 to be brought to the ED because he was feeling suicidal. No acute events overnight, medication compliant, attending to ADLs. Patient seen one-on-one this morning. Informed patient that Toniann Fail has also taken out a restraining order against him. Patient understandably upset and angered by this. He sees his son as his only source of support, and has effectively been cut off from communicating with him due to restraining orders from [place] and his mother. He notes that he will have to go to court again. He continues to have suicidal ideations. He denies HI/AH/VH. No side effects from medications.   Principal Problem: Bipolar 1 disorder, depressed (HCC) Diagnosis: Principal Problem:   Bipolar 1 disorder, depressed (HCC) Active Problems:   Alcohol use disorder, severe, dependence (HCC)  Total Time spent with patient: 30 minutes  Past Psychiatric History: See H&P  Past Medical History:  Past Medical History:  Diagnosis Date   Anxiety    Bipolar 1 disorder (HCC)    Chronic headache    Depression 03/30/2020   ETOH abuse 03/30/2020   Hep C w/o coma, chronic (HCC)    Heroin addiction (HCC)    Horseshoe kidney    Marijuana smoker 03/30/2020    Past Surgical History:  Procedure Laterality Date   TONSILLECTOMY     Family History:  Family History  Problem Relation Age of Onset   Hypertension Mother    Diabetes Mother    Hypertension Father    Heart failure Father    Diabetes Father    Family Psychiatric  History: See H&P Social History:  Social History   Substance and Sexual Activity  Alcohol Use Yes   Alcohol/week: 24.0 standard drinks   Types: 24 Cans of beer per week   Comment: 4 gallons a day- pt reports more than this currently     Social  History   Substance and Sexual Activity  Drug Use Not Currently   Types: Marijuana, Cocaine   Comment: Heroin addiction- denies current drug use    Social History   Socioeconomic History   Marital status: Single    Spouse name: Not on file   Number of children: 1   Years of education: 11   Highest education level: 11th grade  Occupational History   Not on file  Tobacco Use   Smoking status: Every Day    Packs/day: 1.00    Types: Cigarettes   Smokeless tobacco: Current    Types: Chew  Vaping Use   Vaping Use: Never used  Substance and Sexual Activity   Alcohol use: Yes    Alcohol/week: 24.0 standard drinks    Types: 24 Cans of beer per week    Comment: 4 gallons a day- pt reports more than this currently   Drug use: Not Currently    Types: Marijuana, Cocaine    Comment: Heroin addiction- denies current drug use   Sexual activity: Not Currently  Other Topics Concern   Not on file  Social History Narrative   Not on file   Social Determinants of Health   Financial Resource Strain: Not on file  Food Insecurity: Not on file  Transportation Needs: Not on file  Physical Activity: Not on file  Stress: Not on file  Social Connections: Not  on file   Additional Social History:                         Sleep: Poor  Appetite:  Fair  Current Medications: Current Facility-Administered Medications  Medication Dose Route Frequency Provider Last Rate Last Admin   acetaminophen (TYLENOL) tablet 650 mg  650 mg Oral Q6H PRN Gillermo Murdoch, NP   650 mg at 08/29/21 1259   alum & mag hydroxide-simeth (MAALOX/MYLANTA) 200-200-20 MG/5ML suspension 30 mL  30 mL Oral Q4H PRN Gillermo Murdoch, NP       FLUoxetine (PROZAC) capsule 20 mg  20 mg Oral QHS Jesse Sans, MD   20 mg at 08/29/21 2056   hydrOXYzine (ATARAX/VISTARIL) tablet 25 mg  25 mg Oral TID PRN Gillermo Murdoch, NP   25 mg at 08/29/21 1954   magnesium hydroxide (MILK OF MAGNESIA) suspension 30 mL   30 mL Oral Daily PRN Gillermo Murdoch, NP       naltrexone (DEPADE) tablet 50 mg  50 mg Oral Daily Jesse Sans, MD   50 mg at 08/30/21 0809   nicotine polacrilex (NICORETTE) gum 2 mg  2 mg Oral Q4H PRN Jesse Sans, MD   2 mg at 08/29/21 1705   OLANZapine (ZYPREXA) injection 10 mg  10 mg Intramuscular Q6H PRN Jesse Sans, MD       OLANZapine [facility]) tablet 10 mg  10 mg Oral Q6H PRN Jesse Sans, MD   10 mg at 08/29/21 1259   OLANZapine (ZYPREXA) tablet 5 mg  5 mg Oral QHS Jesse Sans, MD   5 mg at 08/29/21 2056   thiamine tablet 100 mg  100 mg Oral Daily Gillermo Murdoch, NP   100 mg at 08/30/21 3007    Lab Results:  No results found for this or any previous visit (from the past 48 hour(s)).   Blood Alcohol level:  Lab Results  Component Value Date   ETH <10 08/27/2021   ETH 329 (HH) 08/18/2021    Metabolic Disorder Labs: Lab Results  Component Value Date   HGBA1C 5.2 02/06/2021   MPG 103 02/06/2021   Lab Results  Component Value Date   PROLACTIN 7.0 04/23/2016   Lab Results  Component Value Date   CHOL 139 02/06/2021   TRIG 55 02/06/2021   HDL 55 02/06/2021   CHOLHDL 2.5 02/06/2021   VLDL 11 02/06/2021   LDLCALC 73 02/06/2021   LDLCALC 105 (H) 04/23/2016    Physical Findings: AIMS:  , ,  ,  ,    CIWA:    COWS:     Musculoskeletal: Strength & Muscle Tone: within normal limits Gait & Station: normal Patient leans: N/A  Psychiatric Specialty Exam:  Presentation  General Appearance: Disheveled  Eye Contact:Poor (wearing a beanie pulled over his eyes)  Speech:Normal Rate  Speech Volume:Normal  Handedness:Right   Mood and Affect  Mood:Depressed, irritable  Affect: Angry  Thought Process  Thought Processes:Goal Directed  Descriptions of Associations:Intact  Orientation:Full (Time, Place and Person)  Thought Content:Logical  History of Schizophrenia/Schizoaffective disorder:No  Duration of Psychotic Symptoms:No  data recorded Hallucinations:None  Ideas of Reference:None  Suicidal Thoughts:Active SI  Homicidal Thoughts:Denies   Sensorium  Memory:Immediate Fair; Recent Fair; Remote Fair  Judgment:Impaired  Insight:Present   Executive Functions  Concentration:Fair  Attention Span:Fair  Recall:Fair  Fund of Knowledge:Fair  Language:Fair   Psychomotor Activity  Psychomotor Activity:No data recorded   Assets  Assets:Desire for  Improvement; Intimacy; Physical Health; Social Support   Sleep  Sleep:Fair 7    Physical Exam: Physical Exam ROS Blood pressure (!) 123/92, pulse 62, temperature 97.7 F (36.5 C), temperature source Oral, resp. rate 18, height 5\' 9"  (1.753 m), weight 59.9 kg, SpO2 99 %. Body mass index is 19.49 kg/m.   Treatment Plan Summary: Daily contact with patient to assess and evaluate symptoms and progress in treatment and Medication management40 year old male presenting for bipolar depression and severe alcohol use disorder. Increase Olanzapine 10 mg QHS and continue Prozac 20 mg QHS, titrate to effect. Continue Naltrexone 50 mg daily for alcohol use disorder, and he desires to start Vivitrol at discharge.   20, MD 08/30/2021, 11:16 AM

## 2021-08-30 NOTE — [facility] Group Notes (Signed)
[facility] LCSW Group Therapy Note  Date/Time:  08/30/2021 1:07 PM- 2:00 PM   Type of Therapy and Topic:  Group Therapy:  Healthy and Unhealthy Supports  Participation Level:  Active   Description of Group:  Patients in this group were introduced to the idea of adding a variety of healthy supports to address the various needs in their lives.Patients discussed what additional healthy supports could be helpful in their recovery and wellness after discharge in order to prevent future hospitalizations.   An emphasis was placed on using counselor, doctor, therapy groups, 12-step groups, and problem-specific support groups to expand supports.  They also worked as a group on developing a specific plan for several patients to deal with unhealthy supports through boundary-setting, psychoeducation with loved ones, and even termination of relationships.   Therapeutic Goals:   1)  discuss importance of adding supports to stay well once out of the hospital  2)  compare healthy versus unhealthy supports and identify some examples of each  3)  generate ideas and descriptions of healthy supports that can be added  4)  offer mutual support about how to address unhealthy supports  5)  encourage active participation in and adherence to discharge plan    Summary of Patient Progress:  Patient spoke about his family situation and not being able to see his son at this time. Patient stated his son is a support to him and keeps him going. Patient stated he is sober and knows alcohol is an unhealthy support for him. Patient stated that his dog and music are a support to him.   Therapeutic Modalities:   Motivational Interviewing Brief Solution-Focused Therapy  Susa Simmonds, Theresia Majors 08/30/2021  3:44 PM

## 2021-08-30 NOTE — Plan of Care (Signed)
  Problem: Education: Goal: Knowledge of [facility] Education information/materials will improve Outcome: Progressing Goal: Emotional status will improve Outcome: Progressing Goal: Mental status will improve Outcome: Progressing Goal: Verbalization of understanding the information provided will improve Outcome: Progressing   Problem: Activity: Goal: Interest or engagement in activities will improve Outcome: Progressing Goal: Sleeping patterns will improve Outcome: Progressing   Problem: Coping: Goal: Ability to verbalize frustrations and anger appropriately will improve Outcome: Progressing Goal: Ability to demonstrate self-control will improve Outcome: Progressing   Problem: Health Behavior/Discharge Planning: Goal: Identification of resources available to assist in meeting health care needs will improve Outcome: Progressing Goal: Compliance with treatment plan for underlying cause of condition will improve Outcome: Progressing   Problem: Physical Regulation: Goal: Ability to maintain clinical measurements within normal limits will improve Outcome: Progressing   Problem: Safety: Goal: Periods of time without injury will increase Outcome: Progressing   Problem: Education: Goal: Utilization of techniques to improve thought processes will improve Outcome: Progressing Goal: Knowledge of the prescribed therapeutic regimen will improve Outcome: Progressing   Problem: Activity: Goal: Interest or engagement in leisure activities will improve Outcome: Progressing Goal: Imbalance in normal sleep/wake cycle will improve Outcome: Progressing   Problem: Coping: Goal: Coping ability will improve Outcome: Progressing Goal: Will verbalize feelings Outcome: Progressing   Problem: Health Behavior/Discharge Planning: Goal: Ability to make decisions will improve Outcome: Progressing Goal: Compliance with therapeutic regimen will improve Outcome: Progressing    Problem: Role Relationship: Goal: Will demonstrate positive changes in social behaviors and relationships Outcome: Progressing   Problem: Safety: Goal: Ability to disclose and discuss suicidal ideas will improve Outcome: Progressing Goal: Ability to identify and utilize support systems that promote safety will improve Outcome: Progressing   Problem: Self-Concept: Goal: Will verbalize positive feelings about self Outcome: Progressing Goal: Level of anxiety will decrease Outcome: Progressing   Problem: Education: Goal: Ability to state activities that reduce stress will improve Outcome: Progressing   Problem: Coping: Goal: Ability to identify and develop effective coping behavior will improve Outcome: Progressing   Problem: Self-Concept: Goal: Ability to identify factors that promote anxiety will improve Outcome: Progressing Goal: Level of anxiety will decrease Outcome: Progressing Goal: Ability to modify response to factors that promote anxiety will improve Outcome: Progressing   Problem: Education: Goal: Ability to make informed decisions regarding treatment will improve Outcome: Progressing   Problem: Coping: Goal: Coping ability will improve Outcome: Progressing   Problem: Health Behavior/Discharge Planning: Goal: Identification of resources available to assist in meeting health care needs will improve Outcome: Progressing   Problem: Medication: Goal: Compliance with prescribed medication regimen will improve Outcome: Progressing   Problem: Self-Concept: Goal: Ability to disclose and discuss suicidal ideas will improve Outcome: Progressing Goal: Will verbalize positive feelings about self Outcome: Progressing   

## 2021-08-30 NOTE — Plan of Care (Signed)
  Problem: Education: Goal: Mental status will improve Outcome: Progressing Goal: Verbalization of understanding the information provided will improve Outcome: Not Progressing   Problem: Activity: Goal: Interest or engagement in activities will improve Outcome: Not Progressing Goal: Sleeping patterns will improve Outcome: Not Progressing   Problem: Coping: Goal: Ability to verbalize frustrations and anger appropriately will improve Outcome: Progressing Goal: Ability to demonstrate self-control will improve Outcome: Progressing

## 2021-08-30 NOTE — Progress Notes (Signed)
Patient presents to nurses station anxious, upset, states his girlfriend broke up with him and told him he will not be able to see his son again. Reports his mother has something to do with it. Patient upset complains of a headache and anxiety. Prn given.

## 2021-08-31 DIAGNOSIS — F319 Bipolar disorder, unspecified: Secondary | ICD-10-CM | POA: Diagnosis not present

## 2021-08-31 NOTE — Progress Notes (Signed)
[facility] MD Progress Note  08/31/2021 12:11 PM Zian Delair  MRN:  [id number]  CC "I don't know."  Subjective:  Mr. Fitzwater is a 35 year old male with a history of bipolar I disorder and substance use disorder who called 911 to be brought to the ED because he was feeling suicidal. No acute events overnight, medication compliant, attending to ADLs. Patient seen one-on-one again today. He is dysphoric and slightly irritable on exam. He continues to have suicidal ideations due to hopelessness about his future. He is homeless at this time due to girlfriend and mother filing restraining orders. He does not feel he would benefit from residential substance abuse treatment as he has been sober for nearly two weeks now. He has also been banned from [employer]. Discussed alternative options such as [facility] or [facility] living [place], and patient did express interest in hearing more about these. He denies any medication side effects. Denies HI/AH/VH.  Principal Problem: Bipolar 1 disorder, depressed (HCC) Diagnosis: Principal Problem:   Bipolar 1 disorder, depressed (HCC) Active Problems:   Alcohol use disorder, severe, dependence (HCC)  Total Time spent with patient: 30 minutes  Past Psychiatric History: See H&P  Past Medical History:  Past Medical History:  Diagnosis Date   Anxiety    Bipolar 1 disorder (HCC)    Chronic headache    Depression 03/30/2020   ETOH abuse 03/30/2020   Hep C w/o coma, chronic (HCC)    Heroin addiction (HCC)    Horseshoe kidney    Marijuana smoker 03/30/2020    Past Surgical History:  Procedure Laterality Date   TONSILLECTOMY     Family History:  Family History  Problem Relation Age of Onset   Hypertension Mother    Diabetes Mother    Hypertension Father    Heart failure Father    Diabetes Father    Family Psychiatric  History: See H&P Social History:  Social History   Substance and Sexual Activity  Alcohol Use Yes   Alcohol/week: 24.0  standard drinks   Types: 24 Cans of beer per week   Comment: 4 gallons a day- pt reports more than this currently     Social History   Substance and Sexual Activity  Drug Use Not Currently   Types: Marijuana, Cocaine   Comment: Heroin addiction- denies current drug use    Social History   Socioeconomic History   Marital status: Single    Spouse name: Not on file   Number of children: 1   Years of education: 11   Highest education level: 11th grade  Occupational History   Not on file  Tobacco Use   Smoking status: Every Day    Packs/day: 1.00    Types: Cigarettes   Smokeless tobacco: Current    Types: Chew  Vaping Use   Vaping Use: Never used  Substance and Sexual Activity   Alcohol use: Yes    Alcohol/week: 24.0 standard drinks    Types: 24 Cans of beer per week    Comment: 4 gallons a day- pt reports more than this currently   Drug use: Not Currently    Types: Marijuana, Cocaine    Comment: Heroin addiction- denies current drug use   Sexual activity: Not Currently  Other Topics Concern   Not on file  Social History Narrative   Not on file   Social Determinants of Health   Financial Resource Strain: Not on file  Food Insecurity: Not on file  Transportation Needs: Not on  file  Physical Activity: Not on file  Stress: Not on file  Social Connections: Not on file   Additional Social History:        Sleep: Fair  Appetite:  Fair  Current Medications: Current Facility-Administered Medications  Medication Dose Route Frequency Provider Last Rate Last Admin   acetaminophen (TYLENOL) tablet 650 mg  650 mg Oral Q6H PRN Gillermo Murdoch, NP   650 mg at 08/30/21 2335   alum & mag hydroxide-simeth (MAALOX/MYLANTA) 200-200-20 MG/5ML suspension 30 mL  30 mL Oral Q4H PRN Gillermo Murdoch, NP       FLUoxetine (PROZAC) capsule 20 mg  20 mg Oral QHS Jesse Sans, MD   20 mg at 08/30/21 2108   hydrOXYzine (ATARAX/VISTARIL) tablet 50 mg  50 mg Oral TID PRN  Jesse Sans, MD   50 mg at 08/30/21 2108   hydrOXYzine (ATARAX/VISTARIL) tablet 50 mg  50 mg Oral Once Jesse Sans, MD       magnesium hydroxide (MILK OF MAGNESIA) suspension 30 mL  30 mL Oral Daily PRN Gillermo Murdoch, NP       naltrexone (DEPADE) tablet 50 mg  50 mg Oral Daily Jesse Sans, MD   50 mg at 08/31/21 0815   nicotine polacrilex (NICORETTE) gum 2 mg  2 mg Oral Q4H PRN Jesse Sans, MD   2 mg at 08/30/21 2335   OLANZapine (ZYPREXA) injection 10 mg  10 mg Intramuscular Q6H PRN Jesse Sans, MD       OLANZapine [facility]) tablet 10 mg  10 mg Oral Q6H PRN Jesse Sans, MD   10 mg at 08/30/21 1421   OLANZapine (ZYPREXA) tablet 10 mg  10 mg Oral QHS Jesse Sans, MD   10 mg at 08/30/21 2108   thiamine tablet 100 mg  100 mg Oral Daily Gillermo Murdoch, NP   100 mg at 08/31/21 0815    Lab Results:  No results found for this or any previous visit (from the past 48 hour(s)).   Blood Alcohol level:  Lab Results  Component Value Date   ETH <10 08/27/2021   ETH 329 (HH) 08/18/2021    Metabolic Disorder Labs: Lab Results  Component Value Date   HGBA1C 5.2 02/06/2021   MPG 103 02/06/2021   Lab Results  Component Value Date   PROLACTIN 7.0 04/23/2016   Lab Results  Component Value Date   CHOL 139 02/06/2021   TRIG 55 02/06/2021   HDL 55 02/06/2021   CHOLHDL 2.5 02/06/2021   VLDL 11 02/06/2021   LDLCALC 73 02/06/2021   LDLCALC 105 (H) 04/23/2016    Physical Findings: AIMS:  , ,  ,  ,    CIWA:    COWS:     Musculoskeletal: Strength & Muscle Tone: within normal limits Gait & Station: normal Patient leans: N/A  Psychiatric Specialty Exam:  Presentation  General Appearance: Disheveled  Eye Contact:Poor (wearing a beanie pulled over his eyes)  Speech:Normal Rate  Speech Volume:Normal  Handedness:Right   Mood and Affect  Mood:Depressed, irritable  Affect: Congruent  Thought Process  Thought Processes:Goal  Directed  Descriptions of Associations:Intact  Orientation:Full (Time, Place and Person)  Thought Content:Logical  History of Schizophrenia/Schizoaffective disorder:No  Duration of Psychotic Symptoms:No data recorded Hallucinations:None  Ideas of Reference:None  Suicidal Thoughts:Active SI  Homicidal Thoughts:Denies   Sensorium  Memory:Immediate Fair; Recent Fair; Remote Fair  Judgment:Impaired  Insight:Present   Executive Functions  Concentration:Fair  Attention Span:Fair  Recall:Fair  Fund of Knowledge:Fair  Language:Fair   Psychomotor Activity  Psychomotor Activity:Decreased   Assets  Assets:Desire for Improvement; Intimacy; Physical Health; Social Support   Sleep  Sleep:Fair 7    Physical Exam: Physical Exam ROS Blood pressure 117/84, pulse 60, temperature (!) 97.5 F (36.4 C), temperature source Oral, resp. rate 18, height 5\' 9"  (1.753 m), weight 59.9 kg, SpO2 99 %. Body mass index is 19.49 kg/m.   Treatment Plan Summary: Daily contact with patient to assess and evaluate symptoms and progress in treatment and Medication management75 year old male presenting for bipolar depression and severe alcohol use disorder. Continue Olanzapine 10 mg QHS and continue Prozac 20 mg QHS, titrate to effect. Continue Naltrexone 50 mg daily for alcohol use disorder, and he desires to start Vivitrol at discharge. CSW providing patient with information about [facility] and list of [facility].   [place], MD 08/31/2021, 12:11 PM

## 2021-08-31 NOTE — Plan of Care (Signed)
  Problem: Education: Goal: Knowledge of [facility] Education information/materials will improve Outcome: Progressing Goal: Emotional status will improve Outcome: Progressing Goal: Mental status will improve Outcome: Progressing Goal: Verbalization of understanding the information provided will improve Outcome: Progressing   Problem: Activity: Goal: Interest or engagement in activities will improve Outcome: Progressing Goal: Sleeping patterns will improve Outcome: Progressing   Problem: Coping: Goal: Ability to verbalize frustrations and anger appropriately will improve Outcome: Progressing Goal: Ability to demonstrate self-control will improve Outcome: Progressing   Problem: Health Behavior/Discharge Planning: Goal: Identification of resources available to assist in meeting health care needs will improve Outcome: Progressing Goal: Compliance with treatment plan for underlying cause of condition will improve Outcome: Progressing   Problem: Physical Regulation: Goal: Ability to maintain clinical measurements within normal limits will improve Outcome: Progressing   Problem: Safety: Goal: Periods of time without injury will increase Outcome: Progressing   Problem: Education: Goal: Utilization of techniques to improve thought processes will improve Outcome: Progressing Goal: Knowledge of the prescribed therapeutic regimen will improve Outcome: Progressing   Problem: Activity: Goal: Interest or engagement in leisure activities will improve Outcome: Progressing Goal: Imbalance in normal sleep/wake cycle will improve Outcome: Progressing   Problem: Coping: Goal: Coping ability will improve Outcome: Progressing Goal: Will verbalize feelings Outcome: Progressing

## 2021-08-31 NOTE — [facility] Counselor (Addendum)
Patient provided phone numbers for [facility] [place] and [facility] in [place] and [address]. Pt encouraged to call regarding arrangement/admission. CSW will follow up at a later time.   Signed:  [name], MSW, [place], LCASA 08/31/2021 10:34 AM

## 2021-08-31 NOTE — [facility] Counselor (Signed)
Patient was provided copy of DL and hospital face sheet in order to apply for [facility] in the area. CSW team will continue to follow up regarding progress.   Signed:  [name], MSW, [place], LCASA 08/31/2021 2:31 PM

## 2021-08-31 NOTE — Progress Notes (Signed)
Patient has been pleasant and cooperative. Socializing with same age peers and watching TV. Denies SI, HI and AVH

## 2021-08-31 NOTE — Group Note (Signed)
[facility] LCSW Group Therapy Note    Group Date: 08/31/2021 Start Time: 1300 End Time: 1400  Type of Therapy and Topic:  Group Therapy:  Overcoming Obstacles  Participation Level:  [facility] PARTICIPATION LEVEL: Active  Mood:  Description of Group:   In this group patients will be encouraged to explore what they see as obstacles to their own wellness and recovery. They will be guided to discuss their thoughts, feelings, and behaviors related to these obstacles. The group will process together ways to cope with barriers, with attention given to specific choices patients can make. Each patient will be challenged to identify changes they are motivated to make in order to overcome their obstacles. This group will be process-oriented, with patients participating in exploration of their own experiences as well as giving and receiving support and challenge from other group members.  Therapeutic Goals: 1. Patient will identify personal and current obstacles as they relate to admission. 2. Patient will identify barriers that currently interfere with their wellness or overcoming obstacles.  3. Patient will identify feelings, thought process and behaviors related to these barriers. 4. Patient will identify two changes they are willing to make to overcome these obstacles:    Summary of Patient Progress Patient was present for group. Patient shared that he has been struggling with transportation, broken relationships with family, and difficulty with obtaining his identification cards. Patient discussed that he has struggled with substance use. He reports that he has been banned from many treatment agencies.  CSW spoke with patient about [facility].  Pt was adamant that he would not go to [facility].   Therapeutic Modalities:   Cognitive Behavioral Therapy Solution Focused Therapy Motivational Interviewing Relapse Prevention Therapy   Harden Mo, LCSW

## 2021-08-31 NOTE — Plan of Care (Signed)
  Problem: Education: Goal: Knowledge of [place] [facility] Education information/materials will improve Outcome: Progressing Goal: Emotional status will improve Outcome: Progressing Goal: Mental status will improve Outcome: Progressing Goal: Verbalization of understanding the information provided will improve Outcome: Progressing   Problem: Activity: Goal: Interest or engagement in activities will improve Outcome: Progressing Goal: Sleeping patterns will improve Outcome: Progressing   Problem: Coping: Goal: Ability to verbalize frustrations and anger appropriately will improve Outcome: Progressing Goal: Ability to demonstrate self-control will improve Outcome: Progressing   Problem: Health Behavior/Discharge Planning: Goal: Identification of resources available to assist in meeting health care needs will improve Outcome: Progressing Goal: Compliance with treatment plan for underlying cause of condition will improve Outcome: Progressing   Problem: Physical Regulation: Goal: Ability to maintain clinical measurements within normal limits will improve Outcome: Progressing   Problem: Safety: Goal: Periods of time without injury will increase Outcome: Progressing   Problem: Education: Goal: Utilization of techniques to improve thought processes will improve Outcome: Progressing Goal: Knowledge of the prescribed therapeutic regimen will improve Outcome: Progressing   Problem: Activity: Goal: Interest or engagement in leisure activities will improve Outcome: Progressing Goal: Imbalance in normal sleep/wake cycle will improve Outcome: Progressing   Problem: Coping: Goal: Coping ability will improve Outcome: Progressing Goal: Will verbalize feelings Outcome: Progressing   Problem: Health Behavior/Discharge Planning: Goal: Ability to make decisions will improve Outcome: Progressing Goal: Compliance with therapeutic regimen will improve Outcome: Progressing    Problem: Role Relationship: Goal: Will demonstrate positive changes in social behaviors and relationships Outcome: Progressing   Problem: Safety: Goal: Ability to disclose and discuss suicidal ideas will improve Outcome: Progressing Goal: Ability to identify and utilize support systems that promote safety will improve Outcome: Progressing   Problem: Self-Concept: Goal: Will verbalize positive feelings about self Outcome: Progressing Goal: Level of anxiety will decrease Outcome: Progressing   Problem: Education: Goal: Ability to state activities that reduce stress will improve Outcome: Progressing   Problem: Coping: Goal: Ability to identify and develop effective coping behavior will improve Outcome: Progressing   Problem: Self-Concept: Goal: Ability to identify factors that promote anxiety will improve Outcome: Progressing Goal: Level of anxiety will decrease Outcome: Progressing Goal: Ability to modify response to factors that promote anxiety will improve Outcome: Progressing   Problem: Education: Goal: Ability to make informed decisions regarding treatment will improve Outcome: Progressing   Problem: Coping: Goal: Coping ability will improve Outcome: Progressing   Problem: Health Behavior/Discharge Planning: Goal: Identification of resources available to assist in meeting health care needs will improve Outcome: Progressing   Problem: Medication: Goal: Compliance with prescribed medication regimen will improve Outcome: Progressing   Problem: Self-Concept: Goal: Ability to disclose and discuss suicidal ideas will improve Outcome: Progressing Goal: Will verbalize positive feelings about self Outcome: Progressing   

## 2021-08-31 NOTE — Progress Notes (Signed)
D: Patient irritable and agitated this am. States, "I'm not a morning person, " when asked to come and take am medications. Reports headache this am. Rates 6/10. Refuses prn medication. Denies SI/HI/AH/VH. Rates anxiety and depression 3/10 this am. More awake and alert this afternoon. Interacting with peers, participating in groups and eating all meals. Making phone calls for discharge placement.   A: Labs and vital signs monitored. Patient supported emotionally and encouraged to express concerns and verbalize needs. Taking all medications as prescribed as well as prn's as requested.   R: No adverse reactions to mediation noted. Reports positive phone call for sober living. Per patient no beds are available but patient was encouraged to continue to call weekly because. Continue Q15 minute  check for safety.

## 2021-09-01 DIAGNOSIS — F319 Bipolar disorder, unspecified: Secondary | ICD-10-CM | POA: Diagnosis not present

## 2021-09-01 MED ORDER — PANTOPRAZOLE SODIUM 40 MG PO TBEC
40.0000 mg | DELAYED_RELEASE_TABLET | Freq: Every day | ORAL | Status: DC
  Administered 2021-09-01 – 2021-09-08 (×8): 40 mg via ORAL
  Filled 2021-09-01 (×8): qty 1

## 2021-09-01 NOTE — Plan of Care (Signed)
  Problem: Education: Goal: Knowledge of [place] [facility] Education information/materials will improve Outcome: Progressing Goal: Emotional status will improve Outcome: Progressing Goal: Mental status will improve Outcome: Progressing Goal: Verbalization of understanding the information provided will improve Outcome: Progressing   Problem: Activity: Goal: Interest or engagement in activities will improve Outcome: Progressing Goal: Sleeping patterns will improve Outcome: Progressing   Problem: Coping: Goal: Ability to verbalize frustrations and anger appropriately will improve Outcome: Progressing Goal: Ability to demonstrate self-control will improve Outcome: Progressing   Problem: Health Behavior/Discharge Planning: Goal: Identification of resources available to assist in meeting health care needs will improve Outcome: Progressing Goal: Compliance with treatment plan for underlying cause of condition will improve Outcome: Progressing   Problem: Physical Regulation: Goal: Ability to maintain clinical measurements within normal limits will improve Outcome: Progressing   Problem: Safety: Goal: Periods of time without injury will increase Outcome: Progressing   Problem: Education: Goal: Utilization of techniques to improve thought processes will improve Outcome: Progressing Goal: Knowledge of the prescribed therapeutic regimen will improve Outcome: Progressing   Problem: Activity: Goal: Interest or engagement in leisure activities will improve Outcome: Progressing Goal: Imbalance in normal sleep/wake cycle will improve Outcome: Progressing   Problem: Coping: Goal: Coping ability will improve Outcome: Progressing Goal: Will verbalize feelings Outcome: Progressing   Problem: Health Behavior/Discharge Planning: Goal: Ability to make decisions will improve Outcome: Progressing Goal: Compliance with therapeutic regimen will improve Outcome: Progressing    Problem: Role Relationship: Goal: Will demonstrate positive changes in social behaviors and relationships Outcome: Progressing   Problem: Safety: Goal: Ability to disclose and discuss suicidal ideas will improve Outcome: Progressing Goal: Ability to identify and utilize support systems that promote safety will improve Outcome: Progressing   Problem: Self-Concept: Goal: Will verbalize positive feelings about self Outcome: Progressing Goal: Level of anxiety will decrease Outcome: Progressing   Problem: Education: Goal: Ability to state activities that reduce stress will improve Outcome: Progressing   Problem: Coping: Goal: Ability to identify and develop effective coping behavior will improve Outcome: Progressing   Problem: Self-Concept: Goal: Ability to identify factors that promote anxiety will improve Outcome: Progressing Goal: Level of anxiety will decrease Outcome: Progressing Goal: Ability to modify response to factors that promote anxiety will improve Outcome: Progressing   Problem: Education: Goal: Ability to make informed decisions regarding treatment will improve Outcome: Progressing   Problem: Coping: Goal: Coping ability will improve Outcome: Progressing   Problem: Health Behavior/Discharge Planning: Goal: Identification of resources available to assist in meeting health care needs will improve Outcome: Progressing   Problem: Medication: Goal: Compliance with prescribed medication regimen will improve Outcome: Progressing   Problem: Self-Concept: Goal: Ability to disclose and discuss suicidal ideas will improve Outcome: Progressing Goal: Will verbalize positive feelings about self Outcome: Progressing   

## 2021-09-01 NOTE — [facility] Counselor (Signed)
CSW seeking placement for Patient to receive SUD residential treatment. The following facilities were contacted:   [facility] - Referral via fax to [facility] ([facility]). CSW will follow up by phone,  [facility] - Patient called and reports he is currently on the wait list.  [facility] - No beds available at this time.  [facility] - Referral sent via fax, CSW to follow up by  phone  [facility] - Referral sent via fax, CSW to follow up by phone.  Signed:  [name], MSW, [place], LCASA 09/01/2021 10:41 AM

## 2021-09-01 NOTE — Progress Notes (Signed)
Pleasant and cooperative. Up late watching TV. Denies SI, HI and AVH

## 2021-09-01 NOTE — Progress Notes (Signed)
[facility] MD Progress Note  09/01/2021 10:31 AM Nathan [name]  MRN:  [id number]  CC "I'm trying, but still no luck."  Subjective:  Nathan [name] is a 35 year old male with a history of bipolar I disorder and substance use disorder who called 911 to be brought to the ED because he was feeling suicidal. No acute events overnight, medication compliant, attending to ADLs. Patient seen one-on-one today. He has made effort to call multiple [facility] yesterday and today without any luck. He notes that being unable to find help despite effort has caused him to feel depressed and suicidal today. However, he is able to contract for safety. He denies any HI/AH/VH. He has been in contact with [facility] and is currently on waiting list. He is also completing applications to [facility] and [facility] today with assistance of CSW team.   Principal Problem: Bipolar 1 disorder, depressed (HCC) Diagnosis: Principal Problem:   Bipolar 1 disorder, depressed (HCC) Active Problems:   Alcohol use disorder, severe, dependence (HCC)  Total Time spent with patient: 30 minutes  Past Psychiatric History: See H&P  Past Medical History:  Past Medical History:  Diagnosis Date   Anxiety    Bipolar 1 disorder (HCC)    Chronic headache    Depression 03/30/2020   ETOH abuse 03/30/2020   Hep C w/o coma, chronic (HCC)    Heroin addiction (HCC)    Horseshoe kidney    Marijuana smoker 03/30/2020    Past Surgical History:  Procedure Laterality Date   TONSILLECTOMY     Family History:  Family History  Problem Relation Age of Onset   Hypertension Mother    Diabetes Mother    Hypertension Father    Heart failure Father    Diabetes Father    Family Psychiatric  History: See H&P Social History:  Social History   Substance and Sexual Activity  Alcohol Use Yes   Alcohol/week: 24.0 standard drinks   Types: 24 Cans of beer per week   Comment: 4 gallons a day- pt reports more than this currently     Social History   Substance  and Sexual Activity  Drug Use Not Currently   Types: Marijuana, Cocaine   Comment: Heroin addiction- denies current drug use    Social History   Socioeconomic History   Marital status: Single    Spouse name: Not on file   Number of children: 1   Years of education: 11   Highest education level: 11th grade  Occupational History   Not on file  Tobacco Use   Smoking status: Every Day    Packs/day: 1.00    Types: Cigarettes   Smokeless tobacco: Current    Types: Chew  Vaping Use   Vaping Use: Never used  Substance and Sexual Activity   Alcohol use: Yes    Alcohol/week: 24.0 standard drinks    Types: 24 Cans of beer per week    Comment: 4 gallons a day- pt reports more than this currently   Drug use: Not Currently    Types: Marijuana, Cocaine    Comment: Heroin addiction- denies current drug use   Sexual activity: Not Currently  Other Topics Concern   Not on file  Social History Narrative   Not on file   Social Determinants of Health   Financial Resource Strain: Not on file  Food Insecurity: Not on file  Transportation Needs: Not on file  Physical Activity: Not on file  Stress: Not on file  Social Connections: Not  on file   Additional Social History:        Sleep: Fair  Appetite:  Fair  Current Medications: Current Facility-Administered Medications  Medication Dose Route Frequency Provider Last Rate Last Admin   acetaminophen (TYLENOL) tablet 650 mg  650 mg Oral Q6H PRN Gillermo Murdoch, NP   650 mg at 09/01/21 0806   alum & mag hydroxide-simeth (MAALOX/MYLANTA) 200-200-20 MG/5ML suspension 30 mL  30 mL Oral Q4H PRN Gillermo Murdoch, NP   30 mL at 09/01/21 0908   FLUoxetine (PROZAC) capsule 20 mg  20 mg Oral QHS Jesse Sans, MD   20 mg at 08/31/21 2104   hydrOXYzine (ATARAX/VISTARIL) tablet 50 mg  50 mg Oral TID PRN Jesse Sans, MD   50 mg at 09/01/21 9937   hydrOXYzine (ATARAX/VISTARIL) tablet 50 mg  50 mg Oral Once Jesse Sans, MD        magnesium hydroxide (MILK OF MAGNESIA) suspension 30 mL  30 mL Oral Daily PRN Gillermo Murdoch, NP       naltrexone (DEPADE) tablet 50 mg  50 mg Oral Daily Jesse Sans, MD   50 mg at 09/01/21 1696   nicotine polacrilex (NICORETTE) gum 2 mg  2 mg Oral Q4H PRN Jesse Sans, MD   2 mg at 09/01/21 0810   OLANZapine (ZYPREXA) injection 10 mg  10 mg Intramuscular Q6H PRN Jesse Sans, MD       OLANZapine [facility]) tablet 10 mg  10 mg Oral Q6H PRN Jesse Sans, MD   10 mg at 08/30/21 1421   OLANZapine (ZYPREXA) tablet 10 mg  10 mg Oral QHS Jesse Sans, MD   10 mg at 08/31/21 2104   pantoprazole (PROTONIX) EC tablet 40 mg  40 mg Oral Daily Jesse Sans, MD       thiamine tablet 100 mg  100 mg Oral Daily Gillermo Murdoch, NP   100 mg at 09/01/21 7893    Lab Results:  No results found for this or any previous visit (from the past 48 hour(s)).   Blood Alcohol level:  Lab Results  Component Value Date   ETH <10 08/27/2021   ETH 329 (HH) 08/18/2021    Metabolic Disorder Labs: Lab Results  Component Value Date   HGBA1C 5.2 02/06/2021   MPG 103 02/06/2021   Lab Results  Component Value Date   PROLACTIN 7.0 04/23/2016   Lab Results  Component Value Date   CHOL 139 02/06/2021   TRIG 55 02/06/2021   HDL 55 02/06/2021   CHOLHDL 2.5 02/06/2021   VLDL 11 02/06/2021   LDLCALC 73 02/06/2021   LDLCALC 105 (H) 04/23/2016    Physical Findings: AIMS:  , ,  ,  ,    CIWA:    COWS:     Musculoskeletal: Strength & Muscle Tone: within normal limits Gait & Station: normal Patient leans: N/A  Psychiatric Specialty Exam:  Presentation  General Appearance: Casual Eye Contact:Good  Speech:Normal Rate  Speech Volume:Normal  Handedness:Right   Mood and Affect  Mood:Depressed, anxious Affect: Congruent  Thought Process  Thought Processes:Goal Directed  Descriptions of Associations:Intact  Orientation:Full (Time, Place and Person)  Thought  Content:Logical  History of Schizophrenia/Schizoaffective disorder:No  Duration of Psychotic Symptoms:No data recorded Hallucinations:None  Ideas of Reference:None  Suicidal Thoughts:Active SI  Homicidal Thoughts:Denies   Sensorium  Memory:Immediate Fair; Recent Fair; Remote Fair  Judgment:Impaired  Insight:Present   Executive Functions  Concentration:Fair  Attention Span:Fair  Recall:Fair  [employer]  of Knowledge:Fair  Language:Fair   Psychomotor Activity  Psychomotor Activity:Normal   Assets  Assets:Desire for Improvement; Intimacy; Physical Health; Social Support   Sleep  Sleep:Fair 7    Physical Exam: Physical Exam ROS Blood pressure 119/81, pulse (!) 55, temperature 97.6 F (36.4 C), temperature source Oral, resp. rate 18, height 5\' 9"  (1.753 m), weight 59.9 kg, SpO2 99 %. Body mass index is 19.49 kg/m.   Treatment Plan Summary: Daily contact with patient to assess and evaluate symptoms and progress in treatment and Medication management61 year old male presenting for bipolar depression and severe alcohol use disorder. Continue Olanzapine 10 mg QHS and continue Prozac 20 mg QHS, titrate to effect. Continue Naltrexone 50 mg daily for alcohol use disorder, and he desires to start Vivitrol at discharge. CSW assisting with applications to [facility] and [facility] today. Patient currently on waiting list for [facility], and actively calling [facility] as well for continued substance abuse treatment.   20, MD 09/01/2021, 10:31 AM

## 2021-09-01 NOTE — Group Note (Signed)
[facility] LCSW Group Therapy Note   Group Date: 09/01/2021 Start Time: 1300 End Time: 1400  Type of Therapy/Topic:  Group Therapy:  Feelings about Diagnosis  Participation Level:  Active   Mood: euphoric w/ pressured speech    Description of Group:    This group will allow patients to explore their thoughts and feelings about diagnoses they have received. Patients will be guided to explore their level of understanding and acceptance of these diagnoses. Facilitator will encourage patients to process their thoughts and feelings about the reactions of others to their diagnosis, and will guide patients in identifying ways to discuss their diagnosis with significant others in their lives. This group will be process-oriented, with patients participating in exploration of their own experiences as well as giving and receiving support and challenge from other group members.   Therapeutic Goals: 1. Patient will demonstrate understanding of diagnosis as evidence by identifying two or more symptoms of the disorder:  2. Patient will be able to express two feelings regarding the diagnosis 3. Patient will demonstrate ability to communicate their needs through discussion and/or role plays  Summary of Patient Progress: Patient was present for the entirety of the group session. Patient was an active listener and participated in the topic of discussion, provided helpful advice to others, and added nuance to topic of conversation. Patient shared personal experiences and received helpful feedback from others. Patient added helpful incite related to topic of discussion and provided helpful analogies.     Therapeutic Modalities:   Cognitive Behavioral Therapy Brief Therapy Feelings Identification    [name], [place]

## 2021-09-01 NOTE — Plan of Care (Addendum)
D: Patient awake and sitting in chairs in hallway this am. Complaints of difficulty sleeping. Rates anxiety 10/10 and depression 10/10. Says he was up last night thinking about his situation. Denies SI/HI/AH/VH. Complain of headache pain. See Mar for details. PRN's given throughout the shift, patient present on the milieu and interacting with peers and staff. Some redirection needed. Taking all medications as prescribed.  A: Labs and vital signs monitored. Patient supported emotionally and encouraged to express concerns.   R: No adverse reaction to medication noted. Cont Q15 minute check for safety.  Problem: Education: Goal: Knowledge of [place] [facility] Education information/materials will improve Outcome: Progressing Goal: Emotional status will improve Outcome: Progressing Goal: Mental status will improve Outcome: Progressing Goal: Verbalization of understanding the information provided will improve Outcome: Progressing   Problem: Activity: Goal: Interest or engagement in activities will improve Outcome: Progressing Goal: Sleeping patterns will improve Outcome: Progressing   Problem: Coping: Goal: Ability to verbalize frustrations and anger appropriately will improve Outcome: Progressing Goal: Ability to demonstrate self-control will improve Outcome: Progressing   Problem: Health Behavior/Discharge Planning: Goal: Identification of resources available to assist in meeting health care needs will improve Outcome: Progressing Goal: Compliance with treatment plan for underlying cause of condition will improve Outcome: Progressing   Problem: Physical Regulation: Goal: Ability to maintain clinical measurements within normal limits will improve Outcome: Progressing   Problem: Safety: Goal: Periods of time without injury will increase Outcome: Progressing   Problem: Education: Goal: Utilization of techniques to improve thought processes will improve Outcome:  Progressing Goal: Knowledge of the prescribed therapeutic regimen will improve Outcome: Progressing   Problem: Activity: Goal: Interest or engagement in leisure activities will improve Outcome: Progressing Goal: Imbalance in normal sleep/wake cycle will improve Outcome: Progressing   Problem: Education: Goal: Utilization of techniques to improve thought processes will improve Outcome: Progressing Goal: Knowledge of the prescribed therapeutic regimen will improve Outcome: Progressing   Problem: Coping: Goal: Coping ability will improve Outcome: Progressing Goal: Will verbalize feelings Outcome: Progressing   Problem: Health Behavior/Discharge Planning: Goal: Ability to make decisions will improve Outcome: Progressing Goal: Compliance with therapeutic regimen will improve Outcome: Progressing

## 2021-09-01 NOTE — Progress Notes (Signed)
Patient provided applications for residential SUD programs and encouraged to complete prior to lunch. CSW to follow up.  Pt reports he has been unable to reach any [employer]. Additionally, he reports he is allowed to return to [facility] though they currently have a wait list for admission.   Signed:  [name], MSW, [place], LCASA 09/01/2021 9:43 AM

## 2021-09-02 DIAGNOSIS — F319 Bipolar disorder, unspecified: Secondary | ICD-10-CM | POA: Diagnosis not present

## 2021-09-02 MED ORDER — ONDANSETRON HCL 4 MG PO TABS
8.0000 mg | ORAL_TABLET | Freq: Three times a day (TID) | ORAL | Status: DC | PRN
  Administered 2021-09-02: 8 mg via ORAL
  Filled 2021-09-02: qty 2

## 2021-09-02 NOTE — Group Note (Signed)
[facility] LCSW Group Therapy Note   Group Date: 09/02/2021 Start Time: 1310 End Time: 1400   Type of Therapy/Topic:  Group Therapy:  Emotion Regulation  Participation Level:  Did Not Attend   Mood:  Description of Group:    The purpose of this group is to assist patients in learning to regulate negative emotions and experience positive emotions. Patients will be guided to discuss ways in which they have been vulnerable to their negative emotions. These vulnerabilities will be juxtaposed with experiences of positive emotions or situations, and patients challenged to use positive emotions to combat negative ones. Special emphasis will be placed on coping with negative emotions in conflict situations, and patients will process healthy conflict resolution skills.  Therapeutic Goals: Patient will identify two positive emotions or experiences to reflect on in order to balance out negative emotions:  Patient will label two or more emotions that they find the most difficult to experience:  Patient will be able to demonstrate positive conflict resolution skills through discussion or role plays:   Summary of Patient Progress: X  Therapeutic Modalities:   Cognitive Behavioral Therapy Feelings Identification Dialectical Behavioral Therapy   Glenis Smoker, LCSW

## 2021-09-02 NOTE — Progress Notes (Signed)
D:Patient alert and awake this morning. Up for breakfast. Denies SI/HI/AH/VH. Rates depression as a 7/10 and anxiety a 6/10. States, " I'm trying to deal with my issues. Reports pain 6/10. See MAR for details.   A: Labs and vital signs monitored. Patient supported emotionally and encouraged to verbalize needs. Patient c/o of upset stomach this shift. Received prn anti-nausea medication. Patient continues to be present on the unit and interact with peers and staff.   R: No adverse reactions to medications noted. Cont Q 15 minute check for safety.

## 2021-09-02 NOTE — Progress Notes (Signed)
Recreation Therapy Notes  Date: 09/02/2021  Time: 9:45am   Location: Courtyard   Behavioral response: N/A   Intervention Topic: Leisure   Discussion/Intervention: Patient did not attend group.   Clinical Observations/Feedback:  Patient did not attend group.   Nathan [name] LRT/CTRS        Nathan [name] 09/02/2021 12:01 PM

## 2021-09-02 NOTE — Progress Notes (Signed)
[facility] MD Progress Note  09/02/2021 1:08 PM Nathan [name]  MRN:  [id number]  CC "I miss my son."  Subjective:  Nathan [name] is a 35 year old male with a history of bipolar I disorder and substance use disorder who called 911 to be brought to the ED because he was feeling suicidal. No acute events overnight, medication compliant, attending to ADLs. Patient seen one-on-one today. He remains depressed with passive SI, but contracts for safety. He denies HI/AH/VH. Patient is currently on the waitlist for [facility] and BATs. Applications completed for [facility] and [facility] as well. [facility] also [facility]. He is banned from the shelter in town, and is unable to return to family as his mother and girlfriend both have restraining orders against him.  Principal Problem: Bipolar 1 disorder, depressed (HCC) Diagnosis: Principal Problem:   Bipolar 1 disorder, depressed (HCC) Active Problems:   Alcohol use disorder, severe, dependence (HCC)  Total Time spent with patient: 30 minutes  Past Psychiatric History: See H&P  Past Medical History:  Past Medical History:  Diagnosis Date   Anxiety    Bipolar 1 disorder (HCC)    Chronic headache    Depression 03/30/2020   ETOH abuse 03/30/2020   Hep C w/o coma, chronic (HCC)    Heroin addiction (HCC)    Horseshoe kidney    Marijuana smoker 03/30/2020    Past Surgical History:  Procedure Laterality Date   TONSILLECTOMY     Family History:  Family History  Problem Relation Age of Onset   Hypertension Mother    Diabetes Mother    Hypertension Father    Heart failure Father    Diabetes Father    Family Psychiatric  History: See H&P Social History:  Social History   Substance and Sexual Activity  Alcohol Use Yes   Alcohol/week: 24.0 standard drinks   Types: 24 Cans of beer per week   Comment: 4 gallons a day- pt reports more than this currently     Social History   Substance and Sexual Activity  Drug Use Not Currently   Types:  Marijuana, Cocaine   Comment: Heroin addiction- denies current drug use    Social History   Socioeconomic History   Marital status: Single    Spouse name: Not on file   Number of children: 1   Years of education: 11   Highest education level: 11th grade  Occupational History   Not on file  Tobacco Use   Smoking status: Every Day    Packs/day: 1.00    Types: Cigarettes   Smokeless tobacco: Current    Types: Chew  Vaping Use   Vaping Use: Never used  Substance and Sexual Activity   Alcohol use: Yes    Alcohol/week: 24.0 standard drinks    Types: 24 Cans of beer per week    Comment: 4 gallons a day- pt reports more than this currently   Drug use: Not Currently    Types: Marijuana, Cocaine    Comment: Heroin addiction- denies current drug use   Sexual activity: Not Currently  Other Topics Concern   Not on file  Social History Narrative   Not on file   Social Determinants of Health   Financial Resource Strain: Not on file  Food Insecurity: Not on file  Transportation Needs: Not on file  Physical Activity: Not on file  Stress: Not on file  Social Connections: Not on file   Additional Social History:  Sleep: Fair  Appetite:  Fair  Current Medications: Current Facility-Administered Medications  Medication Dose Route Frequency Provider Last Rate Last Admin   acetaminophen (TYLENOL) tablet 650 mg  650 mg Oral Q6H PRN Gillermo Murdoch, NP   650 mg at 09/02/21 0808   alum & mag hydroxide-simeth (MAALOX/MYLANTA) 200-200-20 MG/5ML suspension 30 mL  30 mL Oral Q4H PRN Gillermo Murdoch, NP   30 mL at 09/01/21 0908   FLUoxetine (PROZAC) capsule 20 mg  20 mg Oral QHS Jesse Sans, MD   20 mg at 09/01/21 2110   hydrOXYzine (ATARAX/VISTARIL) tablet 50 mg  50 mg Oral TID PRN Jesse Sans, MD   50 mg at 09/02/21 0809   hydrOXYzine (ATARAX/VISTARIL) tablet 50 mg  50 mg Oral Once Jesse Sans, MD       magnesium hydroxide (MILK OF MAGNESIA) suspension 30  mL  30 mL Oral Daily PRN Gillermo Murdoch, NP       naltrexone (DEPADE) tablet 50 mg  50 mg Oral Daily Jesse Sans, MD   50 mg at 09/02/21 4742   nicotine polacrilex (NICORETTE) gum 2 mg  2 mg Oral Q4H PRN Jesse Sans, MD   2 mg at 09/02/21 0809   OLANZapine (ZYPREXA) injection 10 mg  10 mg Intramuscular Q6H PRN Jesse Sans, MD       OLANZapine [facility]) tablet 10 mg  10 mg Oral Q6H PRN Jesse Sans, MD   10 mg at 08/30/21 1421   OLANZapine (ZYPREXA) tablet 10 mg  10 mg Oral QHS Jesse Sans, MD   10 mg at 09/01/21 2110   pantoprazole (PROTONIX) EC tablet 40 mg  40 mg Oral Daily Jesse Sans, MD   40 mg at 09/02/21 5956   thiamine tablet 100 mg  100 mg Oral Daily Gillermo Murdoch, NP   100 mg at 09/02/21 3875    Lab Results:  No results found for this or any previous visit (from the past 48 hour(s)).   Blood Alcohol level:  Lab Results  Component Value Date   ETH <10 08/27/2021   ETH 329 (HH) 08/18/2021    Metabolic Disorder Labs: Lab Results  Component Value Date   HGBA1C 5.2 02/06/2021   MPG 103 02/06/2021   Lab Results  Component Value Date   PROLACTIN 7.0 04/23/2016   Lab Results  Component Value Date   CHOL 139 02/06/2021   TRIG 55 02/06/2021   HDL 55 02/06/2021   CHOLHDL 2.5 02/06/2021   VLDL 11 02/06/2021   LDLCALC 73 02/06/2021   LDLCALC 105 (H) 04/23/2016    Physical Findings: AIMS:  , ,  ,  ,    CIWA:    COWS:     Musculoskeletal: Strength & Muscle Tone: within normal limits Gait & Station: normal Patient leans: N/A  Psychiatric Specialty Exam:  Presentation  General Appearance: Casual Eye Contact:Good  Speech:Normal Rate  Speech Volume:Normal  Handedness:Right   Mood and Affect  Mood:Depressed, anxious Affect: Congruent  Thought Process  Thought Processes:Goal Directed  Descriptions of Associations:Intact  Orientation:Full (Time, Place and Person)  Thought Content:Logical  History of  Schizophrenia/Schizoaffective disorder:No  Duration of Psychotic Symptoms:N/A Hallucinations:None  Ideas of Reference:None  Suicidal Thoughts:passive SI  Homicidal Thoughts:Denies   Sensorium  Memory:Immediate Fair; Recent Fair; Remote Fair  Judgment:Impaired  Insight:Present   Executive Functions  Concentration:Fair  Attention Span:Fair  Recall:Fair  Fund of Knowledge:Fair  Language:Fair   Psychomotor Activity  Psychomotor Activity:Normal  Assets  Assets:Desire for Improvement; Intimacy; Physical Health; Social Support   Sleep  Sleep:Fair 7    Physical Exam: Physical Exam ROS Blood pressure 120/88, pulse 60, temperature 97.8 F (36.6 C), temperature source Oral, resp. rate 17, height 5\' 9"  (1.753 m), weight 59.9 kg, SpO2 100 %. Body mass index is 19.49 kg/m.   Treatment Plan Summary: Daily contact with patient to assess and evaluate symptoms and progress in treatment and Medication management72 year old male presenting for bipolar depression and severe alcohol use disorder. Continue Olanzapine 10 mg QHS and continue Prozac 20 mg QHS, titrate to effect. Continue Naltrexone 50 mg daily for alcohol use disorder, and he desires to start Vivitrol at discharge. Patient is currently on the waitlist for [facility] and [facility]. Applications completed for [facility] and [facility] as well. [facility] also [facility] calling [facility]. He is banned from the shelter in town, and is unable to return to family as his mother and girlfriend both have restraining orders against him.  20, MD 09/02/2021, 1:08 PM

## 2021-09-02 NOTE — [facility] Counselor (Signed)
CSW received call from Delanna Ahmadi @ [facility] [employer] [facility] [phone]. [phone]) requesting additional documentation. Reports beds may become available next week. CSW sent supporting documentation. Physician notified.   Signed:  [name], MSW, [place], LCASA 09/02/2021 10:28 AM

## 2021-09-02 NOTE — Progress Notes (Signed)
Patient alert and oriented x 4, affect is blunted, thoughts are organized, speech is soft non pressured, he was noted interacting appropriately with peers no distress noted, he is receptive to staff, he denies SI/HI/AVH 15 minutes safety checks maintained will continue to monitor.

## 2021-09-02 NOTE — Plan of Care (Signed)
  Problem: Education: Goal: Knowledge of [facility] Education information/materials will improve Outcome: Progressing Goal: Emotional status will improve Outcome: Progressing Goal: Mental status will improve Outcome: Progressing Goal: Verbalization of understanding the information provided will improve Outcome: Progressing   Problem: Activity: Goal: Interest or engagement in activities will improve Outcome: Progressing Goal: Sleeping patterns will improve Outcome: Progressing   Problem: Coping: Goal: Ability to verbalize frustrations and anger appropriately will improve Outcome: Progressing Goal: Ability to demonstrate self-control will improve Outcome: Progressing   Problem: Health Behavior/Discharge Planning: Goal: Identification of resources available to assist in meeting health care needs will improve Outcome: Progressing Goal: Compliance with treatment plan for underlying cause of condition will improve Outcome: Progressing   

## 2021-09-03 ENCOUNTER — Other Ambulatory Visit: Payer: Self-pay

## 2021-09-03 DIAGNOSIS — F319 Bipolar disorder, unspecified: Secondary | ICD-10-CM | POA: Diagnosis not present

## 2021-09-03 MED ORDER — FLUOXETINE HCL 20 MG PO CAPS
20.0000 mg | ORAL_CAPSULE | Freq: Every day | ORAL | 0 refills | Status: DC
  Filled 2021-09-03: qty 7, 7d supply, fill #0

## 2021-09-03 MED ORDER — HYDROXYZINE HCL 50 MG PO TABS
50.0000 mg | ORAL_TABLET | Freq: Three times a day (TID) | ORAL | 0 refills | Status: DC | PRN
  Filled 2021-09-03: qty 21, 7d supply, fill #0

## 2021-09-03 MED ORDER — OLANZAPINE 5 MG PO TABS
15.0000 mg | ORAL_TABLET | Freq: Every day | ORAL | 0 refills | Status: DC
  Filled 2021-09-03: qty 7, 7d supply, fill #0
  Filled 2021-09-03: qty 21, 7d supply, fill #0

## 2021-09-03 MED ORDER — OLANZAPINE 5 MG PO TABS
15.0000 mg | ORAL_TABLET | Freq: Every day | ORAL | Status: DC
  Administered 2021-09-03: 15 mg via ORAL
  Filled 2021-09-03: qty 1

## 2021-09-03 MED ORDER — NALTREXONE HCL 50 MG PO TABS
50.0000 mg | ORAL_TABLET | Freq: Every day | ORAL | 0 refills | Status: DC
  Filled 2021-09-03: qty 7, 7d supply, fill #0

## 2021-09-03 NOTE — Group Note (Signed)
[facility] LCSW Group Therapy Note   Group Date: 09/03/2021 Start Time: 1300 End Time: 1400   Type of Therapy/Topic:  Group Therapy:  Balance in Life  Participation Level:  Active   Description of Group:    This group will address the concept of balance and how it feels and looks when one is unbalanced. Patients will be encouraged to process areas in their lives that are out of balance, and identify reasons for remaining unbalanced. Facilitators will guide patients utilizing problem- solving interventions to address and correct the stressor making their life unbalanced. Understanding and applying boundaries will be explored and addressed for obtaining  and maintaining a balanced life. Patients will be encouraged to explore ways to assertively make their unbalanced needs known to significant others in their lives, using other group members and facilitator for support and feedback.  Therapeutic Goals: Patient will identify two or more emotions or situations they have that consume much of in their lives. Patient will identify signs/triggers that life has become out of balance:  Patient will identify two ways to set boundaries in order to achieve balance in their lives:  Patient will demonstrate ability to communicate their needs through discussion and/or role plays  Summary of Patient Progress: Patient was present for group.  Patient was an active participant in group. Patient identified that he struggles with setting boundaries and being emotionally stable.  Patient was able to identify areas in which he struggles and necessary steps he would need to take to address them.      Therapeutic Modalities:   Cognitive Behavioral Therapy Solution-Focused Therapy Assertiveness Training   Harden Mo, LCSW

## 2021-09-03 NOTE — Progress Notes (Signed)
D Patient pacing in dayroom. Interacting well with peers and still remains intrusive. Denies SI/HI/A/VH and verbally contracted for safety  A PRN Tylenol for headache 7/10 given this am and vistaril for anxiety and scheduled medications administered per Provider order. Support and encouragement provided. Routine safety checks conducted every 15 minutes. Patient notified to inform staff with problems or concerns.  R. No adverse drug reactions noted. All Prn medications effective. Patient contracts for safety at this time. Will continue to monitor Patient.

## 2021-09-03 NOTE — [facility] Counselor (Signed)
CSW received a call from Delanna Ahmadi who reports admissions are roughly 3-4 weeks out.   Below are the statuses for each referral:   [facility] - Pt application withdrew, no beds for 3-4 weeks.  [facility] - Patient called and reports he is currently on the wait list.  [facility] - No beds available at this time.  [facility] - Referral sent via fax, CSW to follow up by  phone. [facility] - Pt accepted, no estimated bed date.   Signed:  [name], MSW, [place], LCASA 09/03/2021 9:19 AM

## 2021-09-03 NOTE — Progress Notes (Signed)
[facility] MD Progress Note  09/03/2021 11:55 AM Nathan [name]  MRN:  [id number]  CC "Depressed, missed my son."  Subjective:  Nathan [name] is a 35 year old male with a history of bipolar I disorder and substance use disorder who called 911 to be brought to the ED because he was feeling suicidal. No acute events overnight, medication compliant, attending to ADLs. Patient remains depressed largely due to seperation from his son. He continues to have passive suicdial ideations. He denies HI/AH/VH. Denies medication side effects. Requests increase in Zyprexa today to assist his mood. He continues to be making efforts to get into an [facility]. Remains on waiting list for [facility] and [facility]. Will also look into [facility] shelter as potential safe disposition.   Principal Problem: Bipolar 1 disorder, depressed (HCC) Diagnosis: Principal Problem:   Bipolar 1 disorder, depressed (HCC) Active Problems:   Alcohol use disorder, severe, dependence (HCC)  Total Time spent with patient: 30 minutes  Past Psychiatric History: See H&P  Past Medical History:  Past Medical History:  Diagnosis Date   Anxiety    Bipolar 1 disorder (HCC)    Chronic headache    Depression 03/30/2020   ETOH abuse 03/30/2020   Hep C w/o coma, chronic (HCC)    Heroin addiction (HCC)    Horseshoe kidney    Marijuana smoker 03/30/2020    Past Surgical History:  Procedure Laterality Date   TONSILLECTOMY     Family History:  Family History  Problem Relation Age of Onset   Hypertension Mother    Diabetes Mother    Hypertension Father    Heart failure Father    Diabetes Father    Family Psychiatric  History: See H&P Social History:  Social History   Substance and Sexual Activity  Alcohol Use Yes   Alcohol/week: 24.0 standard drinks   Types: 24 Cans of beer per week   Comment: 4 gallons a day- pt reports more than this currently     Social History   Substance and Sexual Activity  Drug Use Not Currently    Types: Marijuana, Cocaine   Comment: Heroin addiction- denies current drug use    Social History   Socioeconomic History   Marital status: Single    Spouse name: Not on file   Number of children: 1   Years of education: 11   Highest education level: 11th grade  Occupational History   Not on file  Tobacco Use   Smoking status: Every Day    Packs/day: 1.00    Types: Cigarettes   Smokeless tobacco: Current    Types: Chew  Vaping Use   Vaping Use: Never used  Substance and Sexual Activity   Alcohol use: Yes    Alcohol/week: 24.0 standard drinks    Types: 24 Cans of beer per week    Comment: 4 gallons a day- pt reports more than this currently   Drug use: Not Currently    Types: Marijuana, Cocaine    Comment: Heroin addiction- denies current drug use   Sexual activity: Not Currently  Other Topics Concern   Not on file  Social History Narrative   Not on file   Social Determinants of Health   Financial Resource Strain: Not on file  Food Insecurity: Not on file  Transportation Needs: Not on file  Physical Activity: Not on file  Stress: Not on file  Social Connections: Not on file   Additional Social History:        Sleep: Fair  Appetite:  Fair  Current Medications: Current Facility-Administered Medications  Medication Dose Route Frequency Provider Last Rate Last Admin   acetaminophen (TYLENOL) tablet 650 mg  650 mg Oral Q6H PRN Gillermo Murdoch, NP   650 mg at 09/03/21 0808   alum & mag hydroxide-simeth (MAALOX/MYLANTA) 200-200-20 MG/5ML suspension 30 mL  30 mL Oral Q4H PRN Gillermo Murdoch, NP   30 mL at 09/01/21 0908   FLUoxetine (PROZAC) capsule 20 mg  20 mg Oral QHS Jesse Sans, MD   20 mg at 09/02/21 2103   hydrOXYzine (ATARAX/VISTARIL) tablet 50 mg  50 mg Oral TID PRN Jesse Sans, MD   50 mg at 09/03/21 9211   hydrOXYzine (ATARAX/VISTARIL) tablet 50 mg  50 mg Oral Once Jesse Sans, MD       magnesium hydroxide (MILK OF MAGNESIA)  suspension 30 mL  30 mL Oral Daily PRN Gillermo Murdoch, NP       naltrexone (DEPADE) tablet 50 mg  50 mg Oral Daily Jesse Sans, MD   50 mg at 09/03/21 0806   nicotine polacrilex (NICORETTE) gum 2 mg  2 mg Oral Q4H PRN Jesse Sans, MD   2 mg at 09/03/21 0808   OLANZapine (ZYPREXA) injection 10 mg  10 mg Intramuscular Q6H PRN Jesse Sans, MD       OLANZapine [facility]) tablet 10 mg  10 mg Oral Q6H PRN Jesse Sans, MD   10 mg at 08/30/21 1421   OLANZapine (ZYPREXA) tablet 15 mg  15 mg Oral QHS Jesse Sans, MD       ondansetron [facility]) tablet 8 mg  8 mg Oral Q8H PRN Jesse Sans, MD   8 mg at 09/02/21 1403   pantoprazole (PROTONIX) EC tablet 40 mg  40 mg Oral Daily Jesse Sans, MD   40 mg at 09/03/21 0805   thiamine tablet 100 mg  100 mg Oral Daily Gillermo Murdoch, NP   100 mg at 09/03/21 0805    Lab Results:  No results found for this or any previous visit (from the past 48 hour(s)).   Blood Alcohol level:  Lab Results  Component Value Date   ETH <10 08/27/2021   ETH 329 (HH) 08/18/2021    Metabolic Disorder Labs: Lab Results  Component Value Date   HGBA1C 5.2 02/06/2021   MPG 103 02/06/2021   Lab Results  Component Value Date   PROLACTIN 7.0 04/23/2016   Lab Results  Component Value Date   CHOL 139 02/06/2021   TRIG 55 02/06/2021   HDL 55 02/06/2021   CHOLHDL 2.5 02/06/2021   VLDL 11 02/06/2021   LDLCALC 73 02/06/2021   LDLCALC 105 (H) 04/23/2016    Physical Findings: AIMS:  , ,  ,  ,    CIWA:    COWS:     Musculoskeletal: Strength & Muscle Tone: within normal limits Gait & Station: normal Patient leans: N/A  Psychiatric Specialty Exam:  Presentation  General Appearance: Casual Eye Contact:Good  Speech:Normal Rate  Speech Volume:Normal  Handedness:Right   Mood and Affect  Mood:Depressed, anxious Affect: Congruent  Thought Process  Thought Processes:Goal Directed  Descriptions of  Associations:Intact  Orientation:Full (Time, Place and Person)  Thought Content:Logical  History of Schizophrenia/Schizoaffective disorder:No  Duration of Psychotic Symptoms:N/A Hallucinations:None  Ideas of Reference:None  Suicidal Thoughts:passive SI  Homicidal Thoughts:Denies   Sensorium  Memory:Immediate Fair; Recent Fair; Remote Fair  Judgment:Impaired  Insight:Present   Executive Functions  Concentration:Fair  Attention  Span:Fair  Recall:Fair  Fund of Knowledge:Fair  Language:Fair   Psychomotor Activity  Psychomotor Activity:Normal   Assets  Assets:Desire for Improvement; Intimacy; Physical Health; Social Support   Sleep  Sleep:Fair 7    Physical Exam: Physical Exam ROS Blood pressure (!) 135/95, pulse 62, temperature 98.1 F (36.7 C), temperature source Oral, resp. rate 17, height 5\' 9"  (1.753 m), weight 59.9 kg, SpO2 98 %. Body mass index is 19.49 kg/m.   Treatment Plan Summary: Daily contact with patient to assess and evaluate symptoms and progress in treatment and Medication management37 year old male presenting for bipolar depression and severe alcohol use disorder. Increase Olanzapine 15 mg QHS and continue Prozac 20 mg QHS, titrate to effect. Continue Naltrexone 50 mg daily for alcohol use disorder, and he desires to start Vivitrol at discharge. Patient is currently on the waitlist for [facility] and [facility]. Applications completed for [facility] and [facility] as well. [facility] also [facility]. He is banned from the shelter in town, and is unable to return to family as his mother and girlfriend both have restraining orders against him. Will look into a coordinated entry to [facility].   [facility], MD 09/03/2021, 11:55 AM

## 2021-09-03 NOTE — Progress Notes (Signed)
Recreation Therapy Notes  Date: 09/03/2021  Time: 10:00 am   Location: Craft room    Behavioral response: N/A   Intervention Topic: Necessities    Discussion/Intervention: Patient did not attend group.   Clinical Observations/Feedback:  Patient did not attend group.   Vern Prestia LRT/CTRS          Yashua Bracco 09/03/2021 1:33 PM

## 2021-09-04 DIAGNOSIS — F319 Bipolar disorder, unspecified: Secondary | ICD-10-CM | POA: Diagnosis not present

## 2021-09-04 MED ORDER — OLANZAPINE 10 MG PO TABS
20.0000 mg | ORAL_TABLET | Freq: Every day | ORAL | Status: DC
  Administered 2021-09-04 – 2021-09-07 (×4): 20 mg via ORAL
  Filled 2021-09-04 (×4): qty 2

## 2021-09-04 NOTE — Progress Notes (Signed)
Recreation Therapy Notes  Date: 09/04/2021  Time: 10:00 am   Location: Craft room   Behavioral response: Appropriate  Intervention Topic: Stress Management   Discussion/Intervention:  Group content on today was focused on stress. The group defined stress and way to cope with stress. Participants expressed how they know when they are stresses out. Individuals described the different ways they have to cope with stress. The group stated reasons why it is important to cope with stress. Patient explained what good stress is and some examples. The group participated in the intervention "Stress Management". Individuals were separated into two group and answered questions related to stress.  Clinical Observations/Feedback: Patient came to group late due to unknown reasons. Individual was social with peers and staff while participating in the intervention.  Anyelin Mogle LRT/CTRS         Trafton Roker 09/04/2021 12:24 PM

## 2021-09-04 NOTE — Progress Notes (Signed)
[facility] MD Progress Note  09/04/2021 6:06 PM Nathan [name]  MRN:  [id number] Subjective: Follow-up 35 year old man with a history of bipolar disorder and substance abuse.  Patient continues to be somewhat agitated with pressured speech and angry mood.  Seems confused about what the plan is.  Talks about how he absolutely needs to go to rehab and at the same time shouts about discharging him.  Patient was able to be soothed with supportive counseling and encouragement. Principal Problem: Bipolar 1 disorder, depressed (HCC) Diagnosis: Principal Problem:   Bipolar 1 disorder, depressed (HCC) Active Problems:   Alcohol use disorder, severe, dependence (HCC)  Total Time spent with patient: 30 minutes  Past Psychiatric History: Past history of mood instability depression and substance abuse  Past Medical History:  Past Medical History:  Diagnosis Date   Anxiety    Bipolar 1 disorder (HCC)    Chronic headache    Depression 03/30/2020   ETOH abuse 03/30/2020   Hep C w/o coma, chronic (HCC)    Heroin addiction (HCC)    Horseshoe kidney    Marijuana smoker 03/30/2020    Past Surgical History:  Procedure Laterality Date   TONSILLECTOMY     Family History:  Family History  Problem Relation Age of Onset   Hypertension Mother    Diabetes Mother    Hypertension Father    Heart failure Father    Diabetes Father    Family Psychiatric  History: See previous Social History:  Social History   Substance and Sexual Activity  Alcohol Use Yes   Alcohol/week: 24.0 standard drinks   Types: 24 Cans of beer per week   Comment: 4 gallons a day- pt reports more than this currently     Social History   Substance and Sexual Activity  Drug Use Not Currently   Types: Marijuana, Cocaine   Comment: Heroin addiction- denies current drug use    Social History   Socioeconomic History   Marital status: Single    Spouse name: Not on file   Number of children: 1   Years of education: 11    Highest education level: 11th grade  Occupational History   Not on file  Tobacco Use   Smoking status: Every Day    Packs/day: 1.00    Types: Cigarettes   Smokeless tobacco: Current    Types: Chew  Vaping Use   Vaping Use: Never used  Substance and Sexual Activity   Alcohol use: Yes    Alcohol/week: 24.0 standard drinks    Types: 24 Cans of beer per week    Comment: 4 gallons a day- pt reports more than this currently   Drug use: Not Currently    Types: Marijuana, Cocaine    Comment: Heroin addiction- denies current drug use   Sexual activity: Not Currently  Other Topics Concern   Not on file  Social History Narrative   Not on file   Social Determinants of Health   Financial Resource Strain: Not on file  Food Insecurity: Not on file  Transportation Needs: Not on file  Physical Activity: Not on file  Stress: Not on file  Social Connections: Not on file   Additional Social History:                         Sleep: Fair  Appetite:  Fair  Current Medications: Current Facility-Administered Medications  Medication Dose Route Frequency Provider Last Rate Last Admin   acetaminophen (TYLENOL) tablet  650 mg  650 mg Oral Q6H PRN Gillermo Murdoch, NP   650 mg at 09/04/21 1616   alum & mag hydroxide-simeth (MAALOX/MYLANTA) 200-200-20 MG/5ML suspension 30 mL  30 mL Oral Q4H PRN Gillermo Murdoch, NP   30 mL at 09/01/21 0908   hydrOXYzine (ATARAX/VISTARIL) tablet 50 mg  50 mg Oral TID PRN Jesse Sans, MD   50 mg at 09/04/21 0815   hydrOXYzine (ATARAX/VISTARIL) tablet 50 mg  50 mg Oral Once Jesse Sans, MD       magnesium hydroxide (MILK OF MAGNESIA) suspension 30 mL  30 mL Oral Daily PRN Gillermo Murdoch, NP       naltrexone (DEPADE) tablet 50 mg  50 mg Oral Daily Jesse Sans, MD   50 mg at 09/04/21 0815   nicotine polacrilex (NICORETTE) gum 2 mg  2 mg Oral Q4H PRN Jesse Sans, MD   2 mg at 09/04/21 1232   OLANZapine (ZYPREXA) injection 10 mg   10 mg Intramuscular Q6H PRN Jesse Sans, MD       OLANZapine [facility]) tablet 10 mg  10 mg Oral Q6H PRN Jesse Sans, MD   10 mg at 09/03/21 2039   OLANZapine (ZYPREXA) tablet 20 mg  20 mg Oral QHS Dyneisha Murchison T, MD       ondansetron [facility]) tablet 8 mg  8 mg Oral Q8H PRN Jesse Sans, MD   8 mg at 09/02/21 1403   pantoprazole (PROTONIX) EC tablet 40 mg  40 mg Oral Daily Jesse Sans, MD   40 mg at 09/04/21 0815   thiamine tablet 100 mg  100 mg Oral Daily Gillermo Murdoch, NP   100 mg at 09/04/21 0815    Lab Results: No results found for this or any previous visit (from the past 48 hour(s)).  Blood Alcohol level:  Lab Results  Component Value Date   ETH <10 08/27/2021   ETH 329 (HH) 08/18/2021    Metabolic Disorder Labs: Lab Results  Component Value Date   HGBA1C 5.2 02/06/2021   MPG 103 02/06/2021   Lab Results  Component Value Date   PROLACTIN 7.0 04/23/2016   Lab Results  Component Value Date   CHOL 139 02/06/2021   TRIG 55 02/06/2021   HDL 55 02/06/2021   CHOLHDL 2.5 02/06/2021   VLDL 11 02/06/2021   LDLCALC 73 02/06/2021   LDLCALC 105 (H) 04/23/2016    Physical Findings: AIMS:  , ,  ,  ,    CIWA:    COWS:     Musculoskeletal: Strength & Muscle Tone: within normal limits Gait & Station: normal Patient leans: N/A  Psychiatric Specialty Exam:  Presentation  General Appearance: Disheveled  Eye Contact:Poor (wearing a beanie pulled over his eyes)  Speech:Normal Rate  Speech Volume:Normal  Handedness:Right   Mood and Affect  Mood:Anxious; Depressed  Affect:Tearful   Thought Process  Thought Processes:Goal Directed  Descriptions of Associations:Intact  Orientation:Full (Time, Place and Person)  Thought Content:Logical  History of Schizophrenia/Schizoaffective disorder:No  Duration of Psychotic Symptoms:No data recorded Hallucinations:No data recorded Ideas of Reference:None  Suicidal Thoughts:No data  recorded Homicidal Thoughts:No data recorded  Sensorium  Memory:Immediate Fair; Recent Fair; Remote Fair  Judgment:Impaired  Insight:Present   Executive Functions  Concentration:Fair  Attention Span:Fair  Recall:Fair  Fund of Knowledge:Fair  Language:Fair   Psychomotor Activity  Psychomotor Activity: No data recorded  Assets  Assets:Desire for Improvement; Intimacy; Physical Health; Social Support   Sleep  Sleep: No  data recorded   Physical Exam: Physical Exam Vitals and nursing note reviewed.  Constitutional:      Appearance: Normal appearance.  HENT:     Head: Normocephalic and atraumatic.     Mouth/Throat:     Pharynx: Oropharynx is clear.  Eyes:     Pupils: Pupils are equal, round, and reactive to light.  Cardiovascular:     Rate and Rhythm: Normal rate and regular rhythm.  Pulmonary:     Effort: Pulmonary effort is normal.     Breath sounds: Normal breath sounds.  Abdominal:     General: Abdomen is flat.     Palpations: Abdomen is soft.  Musculoskeletal:        General: Normal range of motion.  Skin:    General: Skin is warm and dry.  Neurological:     General: No focal deficit present.     Mental Status: He is alert. Mental status is at baseline.  Psychiatric:        Attention and Perception: Attention normal.        Mood and Affect: Mood is anxious.        Thought Content: Thought content normal.   Review of Systems  Constitutional: Negative.   HENT: Negative.    Eyes: Negative.   Respiratory: Negative.    Cardiovascular: Negative.   Gastrointestinal: Negative.   Musculoskeletal: Negative.   Skin: Negative.   Neurological: Negative.   Psychiatric/Behavioral:  Positive for depression. Negative for hallucinations, substance abuse and suicidal ideas. The patient is nervous/anxious.   Blood pressure (!) 117/95, pulse 75, temperature 98.1 F (36.7 C), temperature source Oral, resp. rate 17, height 5\' 9"  (1.753 m), weight 59.9 kg, SpO2  99 %. Body mass index is 19.49 kg/m.   Treatment Plan Summary: Plan discontinued antidepressant.  Increase dose of mood stabilizer.  Encourage patient to be active on the ward but to relax.  Treatment team still looking into substance abuse options.  , MD 09/04/2021, 6:06 PM

## 2021-09-04 NOTE — Group Note (Signed)
[facility] LCSW Group Therapy Note   Group Date: 09/04/2021 Start Time: 1300 End Time: 1400  Type of Therapy and Topic:  Group Therapy:  Feelings around Relapse and Recovery  Participation Level:  Active   Mood: Joyful   Description of Group:    Patients in this group will discuss emotions they experience before and after a relapse. They will process how experiencing these feelings, or avoidance of experiencing them, relates to having a relapse. Facilitator will guide patients to explore emotions they have related to recovery. Patients will be encouraged to process which emotions are more powerful. They will be guided to discuss the emotional reaction significant others in their lives may have to patients' relapse or recovery. Patients will be assisted in exploring ways to respond to the emotions of others without this contributing to a relapse.  Therapeutic Goals: Patient will identify two or more emotions that lead to relapse for them:  Patient will identify two emotions that result when they relapse:  Patient will identify two emotions related to recovery:  Patient will demonstrate ability to communicate their needs through discussion and/or role plays.   Summary of Patient Progress: Patient was present for the entirety of the group session. Patient was an active listener and participated in the topic of discussion, provided helpful advice to others, and added nuance to topic of conversation. Patient shared personal experiences which were discussed by the group. Discussed sages of change and reports that he is currently in the preparation stage as he is considering SUD tx options.    Therapeutic Modalities:   Cognitive Behavioral Therapy Solution-Focused Therapy Assertiveness Training Relapse Prevention Therapy   [name], [place]

## 2021-09-04 NOTE — Progress Notes (Signed)
Patient continues to be anxious with rapid pressured speech. Noted pacing hall with peer. Denies SI, HI, AVH. Medication compliant. Appropriate with staff and peers. Encouragement and support provided. Safety checks maintained. Medications given as prescribed. Pt receptive and remains safe on unit with q 15 min checks.

## 2021-09-04 NOTE — [facility] Counselor (Addendum)
ADDENDUM  CSW received call back from admission representative w/ [facility]. Patient completed screener at this time. Admission representative requested CSW to get attorney letter for upcoming court date on 20 September 2021. CSW contacted attorney w/ patient present. Awaiting letter at this time. Situation ongoing, CSW will continue to monitor and update note as more information becomes available.    Signed:  [name], MSW, [place], LCASA 09/04/2021 11:45 AM    CSW attempted to reach admission representative at [facility] regarding application. CSW talked to Diplomatic Services operational officer and requested callback from counselor. CSW awaiting callback. Situation ongoing, CSW will continue to monitor and update note as more information becomes available.   Signed:  [name], MSW, [place], LCASA 09/04/2021 10:47 AM

## 2021-09-04 NOTE — Plan of Care (Signed)
  Problem: Education: Goal: Knowledge of [place] [facility] Education information/materials will improve Outcome: Progressing Goal: Emotional status will improve Outcome: Progressing Goal: Mental status will improve Outcome: Progressing Goal: Verbalization of understanding the information provided will improve Outcome: Progressing   Problem: Activity: Goal: Interest or engagement in activities will improve Outcome: Progressing   

## 2021-09-04 NOTE — Progress Notes (Signed)
Patient has been in the milieu and participated in group activities. Has remained hyperactive and somewhat intrusive with somatic behaviors. Denying SI/HI. Ate his meals and took medications. Encouragements and support provided. Safety precautions maintained.Marland Kitchen

## 2021-09-05 DIAGNOSIS — F319 Bipolar disorder, unspecified: Secondary | ICD-10-CM | POA: Diagnosis not present

## 2021-09-05 NOTE — BH IP Treatment Plan (Signed)
Interdisciplinary Treatment and Diagnostic Plan Update  09/05/2021 Time of Session: 12:00PM Nathan [name] MRN: [id number]  Principal Diagnosis: Bipolar 1 disorder, depressed (HCC)  Secondary Diagnoses: Principal Problem:   Bipolar 1 disorder, depressed (HCC) Active Problems:   Alcohol use disorder, severe, dependence (HCC)   Current Medications:  Current Facility-Administered Medications  Medication Dose Route Frequency Provider Last Rate Last Admin   acetaminophen (TYLENOL) tablet 650 mg  650 mg Oral Q6H PRN Gillermo Murdoch, NP   650 mg at 09/04/21 1616   alum & mag hydroxide-simeth (MAALOX/MYLANTA) 200-200-20 MG/5ML suspension 30 mL  30 mL Oral Q4H PRN Gillermo Murdoch, NP   30 mL at 09/01/21 0908   hydrOXYzine (ATARAX/VISTARIL) tablet 50 mg  50 mg Oral TID PRN Jesse Sans, MD   50 mg at 09/04/21 1943   hydrOXYzine (ATARAX/VISTARIL) tablet 50 mg  50 mg Oral Once Jesse Sans, MD       magnesium hydroxide (MILK OF MAGNESIA) suspension 30 mL  30 mL Oral Daily PRN Gillermo Murdoch, NP       naltrexone (DEPADE) tablet 50 mg  50 mg Oral Daily Jesse Sans, MD   50 mg at 09/04/21 0815   nicotine polacrilex (NICORETTE) gum 2 mg  2 mg Oral Q4H PRN Jesse Sans, MD   2 mg at 09/04/21 2114   OLANZapine (ZYPREXA) injection 10 mg  10 mg Intramuscular Q6H PRN Jesse Sans, MD       OLANZapine [facility]) tablet 10 mg  10 mg Oral Q6H PRN Jesse Sans, MD   10 mg at 09/04/21 2116   OLANZapine (ZYPREXA) tablet 20 mg  20 mg Oral QHS Clapacs, John T, MD   20 mg at 09/04/21 2115   ondansetron (ZOFRAN) tablet 8 mg  8 mg Oral Q8H PRN Jesse Sans, MD   8 mg at 09/02/21 1403   pantoprazole (PROTONIX) EC tablet 40 mg  40 mg Oral Daily Jesse Sans, MD   40 mg at 09/04/21 0815   thiamine tablet 100 mg  100 mg Oral Daily Gillermo Murdoch, NP   100 mg at 09/04/21 0815   PTA Medications: Medications Prior to Admission  Medication Sig Dispense Refill Last  Dose   hydrOXYzine (ATARAX/VISTARIL) 25 MG tablet Take 1 tablet (25 mg total) by mouth 3 (three) times daily as needed for anxiety. 21 tablet 0    lamoTRIgine (LAMICTAL) 100 MG tablet Take 1 tablet (100 mg total) by mouth daily. 30 tablet 0    lamoTRIgine (LAMICTAL) 25 MG tablet Take 1 tablet (25 mg total) by mouth daily for 7 days. 7 tablet 0    thiamine (VITAMIN B-1) 100 MG tablet Take 1 tablet (100 mg total) by mouth daily. 7 tablet 0    traZODone (DESYREL) 100 MG tablet Take 1 tablet (100 mg total) by mouth at bedtime as needed for sleep. 7 tablet 0    [DISCONTINUED] naltrexone (DEPADE) 50 MG tablet Take 1 tablet (50 mg total) by mouth daily. 7 tablet 0     Patient Stressors: Financial difficulties   Legal issue   Loss of family support   Marital or family conflict   Substance abuse    Patient Strengths: Capable of independent living  Communication skills  Other: Relationship with son  Treatment Modalities: Medication Management, Group therapy, Case management,  1 to 1 session with clinician, Psychoeducation, Recreational therapy.   Physician Treatment Plan for Primary Diagnosis: Bipolar 1 disorder, depressed (HCC) Long Term Goal(s):  Improvement in symptoms so as ready for discharge   Short Term Goals: Ability to identify changes in lifestyle to reduce recurrence of condition will improve Ability to verbalize feelings will improve Ability to disclose and discuss suicidal ideas Ability to demonstrate self-control will improve Ability to identify and develop effective coping behaviors will improve Ability to maintain clinical measurements within normal limits will improve Compliance with prescribed medications will improve Ability to identify triggers associated with substance abuse/mental health issues will improve  Medication Management: Evaluate patient's response, side effects, and tolerance of medication regimen.  Therapeutic Interventions: 1 to 1 sessions, Unit Group  sessions and Medication administration.  Evaluation of Outcomes: Progressing  Physician Treatment Plan for Secondary Diagnosis: Principal Problem:   Bipolar 1 disorder, depressed (HCC) Active Problems:   Alcohol use disorder, severe, dependence (HCC)  Long Term Goal(s): Improvement in symptoms so as ready for discharge   Short Term Goals: Ability to identify changes in lifestyle to reduce recurrence of condition will improve Ability to verbalize feelings will improve Ability to disclose and discuss suicidal ideas Ability to demonstrate self-control will improve Ability to identify and develop effective coping behaviors will improve Ability to maintain clinical measurements within normal limits will improve Compliance with prescribed medications will improve Ability to identify triggers associated with substance abuse/mental health issues will improve     Medication Management: Evaluate patient's response, side effects, and tolerance of medication regimen.  Therapeutic Interventions: 1 to 1 sessions, Unit Group sessions and Medication administration.  Evaluation of Outcomes: Progressing   RN Treatment Plan for Primary Diagnosis: Bipolar 1 disorder, depressed (HCC) Long Term Goal(s): Knowledge of disease and therapeutic regimen to maintain health will improve  Short Term Goals: Ability to remain free from injury will improve, Ability to verbalize feelings will improve, Ability to identify and develop effective coping behaviors will improve, and Compliance with prescribed medications will improve  Medication Management: RN will administer medications as ordered by provider, will assess and evaluate patient's response and provide education to patient for prescribed medication. RN will report any adverse and/or side effects to prescribing provider.  Therapeutic Interventions: 1 on 1 counseling sessions, Psychoeducation, Medication administration, Evaluate responses to treatment, Monitor  vital signs and CBGs as ordered, Perform/monitor CIWA, COWS, AIMS and Fall Risk screenings as ordered, Perform wound care treatments as ordered.  Evaluation of Outcomes: Progressing   LCSW Treatment Plan for Primary Diagnosis: Bipolar 1 disorder, depressed (HCC) Long Term Goal(s): Safe transition to appropriate next level of care at discharge, Engage patient in therapeutic group addressing interpersonal concerns.  Short Term Goals: Engage patient in aftercare planning with referrals and resources, Increase social support, Facilitate acceptance of mental health diagnosis and concerns, Facilitate patient progression through stages of change regarding substance use diagnoses and concerns, Identify triggers associated with mental health/substance abuse issues, and Increase skills for wellness and recovery  Therapeutic Interventions: Assess for all discharge needs, 1 to 1 time with Social worker, Explore available resources and support systems, Assess for adequacy in community support network, Educate family and significant other(s) on suicide prevention, Complete Psychosocial Assessment, Interpersonal group therapy.  Evaluation of Outcomes: Progressing   Progress in Treatment: Attending groups: Yes. Participating in groups: Yes. Taking medication as prescribed: Yes. Toleration medication: Yes. Family/Significant other contact made: No, will contact:  Patient provided phone numbers to family members who have active restraining order against patient. Contact will be made when additional family/significant other is identified. Patient understands diagnosis: Yes. Discussing patient identified problems/goals with staff: Yes.  However, per chart review, patient remains agitated with pressured speech and an angry mood. Medical problems stabilized or resolved: Yes. Denies suicidal/homicidal ideation: Yes. Issues/concerns per patient self-inventory: No. Other: None.  New problem(s) identified: No,  Describe:  none.  New Short Term/Long Term Goal(s): detox, medication management for mood stabilization; elimination of SI thoughts; development of comprehensive mental wellness/sobriety plan. Update 09/05/2021: No changes at this time.   Patient Goals: Pt declined to meet for treatment team meeting although personal invitation was extended. Update 09/05/2021: No changes at this time.   Discharge Plan or Barriers: CSW will assist pt with development of an appropriate aftercare/discharge plan. Update: 09/05/2021: No changes at this time.   Reason for Continuation of Hospitalization: Aggression Medication stabilization  Estimated Length of Stay: TBD   Scribe for Treatment Team: Marletta Lor 09/05/2021 12:08 PM

## 2021-09-05 NOTE — Progress Notes (Signed)
Patient is pleasant and cooperative. Very active on the unit. Walking the halls and hanging in the day room. Gets along well with peers. Denies si/hi/avh/depression and pain. Does endorse anxiety and seeks PRN as much as he can. Med compliant and receives meds without incident. Will continue to monitor with q15 minute safety checks.   Cleo Butler-Nicholson, LPN

## 2021-09-05 NOTE — Progress Notes (Signed)
[facility] MD Progress Note  09/05/2021 1:43 PM Nathan [name]  MRN:  [id number] Subjective: Follow-up patient with bipolar disorder and substance abuse.  Patient today reports his mood is better.  He says he was given some good news about his application to rehab programs yesterday.  I do not see any documentation of that but if he says it is true I imagine something did happen.  Not feeling irritable today.  Still a little bit hyperactive but neatly Principal Problem: Bipolar 1 disorder, depressed (HCC) Diagnosis: Principal Problem:   Bipolar 1 disorder, depressed (HCC) Active Problems:   Alcohol use disorder, severe, dependence (HCC)  Total Time spent with patient: 30 minutes  Past Psychiatric History: chronic mood and SA problems  Past Medical History:  Past Medical History:  Diagnosis Date   Anxiety    Bipolar 1 disorder (HCC)    Chronic headache    Depression 03/30/2020   ETOH abuse 03/30/2020   Hep C w/o coma, chronic (HCC)    Heroin addiction (HCC)    Horseshoe kidney    Marijuana smoker 03/30/2020    Past Surgical History:  Procedure Laterality Date   TONSILLECTOMY     Family History:  Family History  Problem Relation Age of Onset   Hypertension Mother    Diabetes Mother    Hypertension Father    Heart failure Father    Diabetes Father    Family Psychiatric  History: see previous Social History:  Social History   Substance and Sexual Activity  Alcohol Use Yes   Alcohol/week: 24.0 standard drinks   Types: 24 Cans of beer per week   Comment: 4 gallons a day- pt reports more than this currently     Social History   Substance and Sexual Activity  Drug Use Not Currently   Types: Marijuana, Cocaine   Comment: Heroin addiction- denies current drug use    Social History   Socioeconomic History   Marital status: Single    Spouse name: Not on file   Number of children: 1   Years of education: 11   Highest education level: 11th grade  Occupational History    Not on file  Tobacco Use   Smoking status: Every Day    Packs/day: 1.00    Types: Cigarettes   Smokeless tobacco: Current    Types: Chew  Vaping Use   Vaping Use: Never used  Substance and Sexual Activity   Alcohol use: Yes    Alcohol/week: 24.0 standard drinks    Types: 24 Cans of beer per week    Comment: 4 gallons a day- pt reports more than this currently   Drug use: Not Currently    Types: Marijuana, Cocaine    Comment: Heroin addiction- denies current drug use   Sexual activity: Not Currently  Other Topics Concern   Not on file  Social History Narrative   Not on file   Social Determinants of Health   Financial Resource Strain: Not on file  Food Insecurity: Not on file  Transportation Needs: Not on file  Physical Activity: Not on file  Stress: Not on file  Social Connections: Not on file   Additional Social History:                         Sleep: Fair  Appetite:  Fair  Current Medications: Current Facility-Administered Medications  Medication Dose Route Frequency Provider Last Rate Last Admin   acetaminophen (TYLENOL) tablet 650 mg  650 mg Oral Q6H PRN Gillermo Murdoch, NP   650 mg at 09/05/21 1221   alum & mag hydroxide-simeth (MAALOX/MYLANTA) 200-200-20 MG/5ML suspension 30 mL  30 mL Oral Q4H PRN Gillermo Murdoch, NP   30 mL at 09/01/21 0908   hydrOXYzine (ATARAX/VISTARIL) tablet 50 mg  50 mg Oral TID PRN Jesse Sans, MD   50 mg at 09/05/21 1225   hydrOXYzine (ATARAX/VISTARIL) tablet 50 mg  50 mg Oral Once Jesse Sans, MD       magnesium hydroxide (MILK OF MAGNESIA) suspension 30 mL  30 mL Oral Daily PRN Gillermo Murdoch, NP       naltrexone (DEPADE) tablet 50 mg  50 mg Oral Daily Jesse Sans, MD   50 mg at 09/05/21 1222   nicotine polacrilex (NICORETTE) gum 2 mg  2 mg Oral Q4H PRN Jesse Sans, MD   2 mg at 09/05/21 1221   OLANZapine (ZYPREXA) injection 10 mg  10 mg Intramuscular Q6H PRN Jesse Sans, MD        OLANZapine [facility]) tablet 10 mg  10 mg Oral Q6H PRN Jesse Sans, MD   10 mg at 09/04/21 2116   OLANZapine (ZYPREXA) tablet 20 mg  20 mg Oral QHS Tatiyana Foucher T, MD   20 mg at 09/04/21 2115   ondansetron (ZOFRAN) tablet 8 mg  8 mg Oral Q8H PRN Jesse Sans, MD   8 mg at 09/02/21 1403   pantoprazole (PROTONIX) EC tablet 40 mg  40 mg Oral Daily Jesse Sans, MD   40 mg at 09/05/21 1222   thiamine tablet 100 mg  100 mg Oral Daily Gillermo Murdoch, NP   100 mg at 09/05/21 1222    Lab Results: No results found for this or any previous visit (from the past 48 hour(s)).  Blood Alcohol level:  Lab Results  Component Value Date   ETH <10 08/27/2021   ETH 329 (HH) 08/18/2021    Metabolic Disorder Labs: Lab Results  Component Value Date   HGBA1C 5.2 02/06/2021   MPG 103 02/06/2021   Lab Results  Component Value Date   PROLACTIN 7.0 04/23/2016   Lab Results  Component Value Date   CHOL 139 02/06/2021   TRIG 55 02/06/2021   HDL 55 02/06/2021   CHOLHDL 2.5 02/06/2021   VLDL 11 02/06/2021   LDLCALC 73 02/06/2021   LDLCALC 105 (H) 04/23/2016    Physical Findings: AIMS:  , ,  ,  ,    CIWA:    COWS:     Musculoskeletal: Strength & Muscle Tone: within normal limits Gait & Station: normal Patient leans: N/A  Psychiatric Specialty Exam:  Presentation  General Appearance: Disheveled  Eye Contact:Poor (wearing a beanie pulled over his eyes)  Speech:Normal Rate  Speech Volume:Normal  Handedness:Right   Mood and Affect  Mood:Anxious; Depressed  Affect:Tearful   Thought Process  Thought Processes:Goal Directed  Descriptions of Associations:Intact  Orientation:Full (Time, Place and Person)  Thought Content:Logical  History of Schizophrenia/Schizoaffective disorder:No  Duration of Psychotic Symptoms:No data recorded Hallucinations:No data recorded Ideas of Reference:None  Suicidal Thoughts:No data recorded Homicidal Thoughts:No data  recorded  Sensorium  Memory:Immediate Fair; Recent Fair; Remote Fair  Judgment:Impaired  Insight:Present   Executive Functions  Concentration:Fair  Attention Span:Fair  Recall:Fair  Fund of Knowledge:Fair  Language:Fair   Psychomotor Activity  Psychomotor Activity: No data recorded  Assets  Assets:Desire for Improvement; Intimacy; Physical Health; Social Support   Sleep  Sleep: No  data recorded   Physical Exam: Physical Exam Vitals and nursing note reviewed.  Constitutional:      Appearance: Normal appearance.  HENT:     Head: Normocephalic and atraumatic.     Mouth/Throat:     Pharynx: Oropharynx is clear.  Eyes:     Pupils: Pupils are equal, round, and reactive to light.  Cardiovascular:     Rate and Rhythm: Normal rate and regular rhythm.  Pulmonary:     Effort: Pulmonary effort is normal.     Breath sounds: Normal breath sounds.  Abdominal:     General: Abdomen is flat.     Palpations: Abdomen is soft.  Musculoskeletal:        General: Normal range of motion.  Skin:    General: Skin is warm and dry.  Neurological:     General: No focal deficit present.     Mental Status: He is alert. Mental status is at baseline.  Psychiatric:        Mood and Affect: Mood normal.        Thought Content: Thought content normal.   Review of Systems  Constitutional: Negative.   HENT: Negative.    Eyes: Negative.   Respiratory: Negative.    Cardiovascular: Negative.   Gastrointestinal: Negative.   Musculoskeletal: Negative.   Skin: Negative.   Neurological: Negative.   Psychiatric/Behavioral: Negative.    Blood pressure 127/87, pulse 62, temperature 97.6 F (36.4 C), temperature source Oral, resp. rate 18, height 5\' 9"  (1.753 m), weight 59.9 kg, SpO2 98 %. Body mass index is 19.49 kg/m.   Treatment Plan Summary: Medication management and Plan No medication change. Supportive therapy. Reviewed with treatment team  , MD 09/05/2021, 1:43  PM

## 2021-09-05 NOTE — Group Note (Signed)
[facility] LCSW Group Therapy Note   Group Date: 09/05/2021 Start Time: 1300 End Time: 1400   Type of Therapy and Topic: Group Therapy: Avoiding Self-Sabotaging and Enabling Behaviors  Participation Level: Active  Mood: Anxious  Description of Group:  In this group, patients will learn how to identify obstacles, self-sabotaging and enabling behaviors, as well as: what are they, why do we do them and what needs these behaviors meet. Discuss unhealthy relationships and how to have positive healthy boundaries with those that sabotage and enable. Explore aspects of self-sabotage and enabling in yourself and how to limit these self-destructive behaviors in everyday life.   Therapeutic Goals: 1. Patient will identify one obstacle that relates to self-sabotage and enabling behaviors 2. Patient will identify one personal self-sabotaging or enabling behavior they did prior to admission 3. Patient will state a plan to change the above identified behavior 4. Patient will demonstrate ability to communicate their needs through discussion and/or role play.    Summary of Patient Progress: Patient presented for the entirety of the group and participated in discussion relevant to the topic. Patient was engaged in the group topic and identified several ways he self-sabotages, including drinking alcohol, fighting with others, and isolating. Patient presented with a passionate disposition, eager to participate in discussion and engage with peers. Patient committed to practicing breathing exercises as a healthy coping skill.  Therapeutic Modalities:  Cognitive Behavioral Therapy Person-Centered Therapy Motivational Interviewing    Ileana Ladd Homeworth, Theresia Majors 09/05/2021 4:46PM

## 2021-09-05 NOTE — Plan of Care (Signed)
D: Patient denies SI/HI/AVH. Patient presents with pleasant during assessment. Patient has no physical complaints at this time.  A: Patient was provided with verbal education on provided medications. Patient care plan was reviewed. Patient was offered support and encouragement. Patient was encourage to attend groups, participate in unit activities and continue with plan of care.    R: Patient has no complaints of pain at this time. Patient is receptive to treatment and safety maintained on unit.     Problem: Education: Goal: Knowledge of [place] [facility] Education information/materials will improve Outcome: Progressing Goal: Emotional status will improve Outcome: Progressing Goal: Mental status will improve Outcome: Progressing Goal: Verbalization of understanding the information provided will improve Outcome: Not Progressing

## 2021-09-05 NOTE — Plan of Care (Signed)
  Problem: Education: Goal: Knowledge of [place] [facility] Education information/materials will improve Outcome: Progressing Goal: Emotional status will improve Outcome: Progressing Goal: Mental status will improve Outcome: Progressing Goal: Verbalization of understanding the information provided will improve Outcome: Progressing   Problem: Activity: Goal: Interest or engagement in activities will improve Outcome: Progressing Goal: Sleeping patterns will improve Outcome: Progressing   Problem: Coping: Goal: Ability to verbalize frustrations and anger appropriately will improve Outcome: Progressing Goal: Ability to demonstrate self-control will improve Outcome: Progressing   

## 2021-09-05 NOTE — Plan of Care (Signed)
Patient stayed in bed all AM and woke up around lunch time: ate lunch and received his missed medications. Complained on headache and anxiety and PRN given. Patient has been calling places for after care placement. He is expressing concerns, reporting that he does not want to return to the previous living arrangement "see I want to do better.  Wouldn't help me if I have to return there....".  Currently visible in the milieu with no change to usual behavior. Encouragements and support provided. Safety monitored as expected.

## 2021-09-06 DIAGNOSIS — F319 Bipolar disorder, unspecified: Secondary | ICD-10-CM | POA: Diagnosis not present

## 2021-09-06 NOTE — Progress Notes (Signed)
Patient alert and oriented. Patient denies SI, HI, and AVH. Patient was isolative before lunch but after lunch patient was observed interacting with staff and peer. Patient observed pacing through the halls.  Patient was compliant with medications.  Remains safe on the unit with q15 minute safety checks.

## 2021-09-06 NOTE — Progress Notes (Signed)
[facility] MD Progress Note  09/06/2021 12:23 PM Nathan [name]  MRN:  [id number] Subjective: Follow-up 35 year old man with bipolar disorder and substance abuse.  Patient continues to report that his mood is better.  He still gets rude and cranky at times but has not been grossly manic.  Tolerating medicine well.  Denies suicidal ideation.  Remains focused on wanting to go to substance abuse treatment Principal Problem: Bipolar 1 disorder, depressed (HCC) Diagnosis: Principal Problem:   Bipolar 1 disorder, depressed (HCC) Active Problems:   Alcohol use disorder, severe, dependence (HCC)  Total Time spent with patient: 30 minutes  Past Psychiatric History: Past history of recurrent presentations to the ER and recurrent admissions with mood instability and substance use problems  Past Medical History:  Past Medical History:  Diagnosis Date   Anxiety    Bipolar 1 disorder (HCC)    Chronic headache    Depression 03/30/2020   ETOH abuse 03/30/2020   Hep C w/o coma, chronic (HCC)    Heroin addiction (HCC)    Horseshoe kidney    Marijuana smoker 03/30/2020    Past Surgical History:  Procedure Laterality Date   TONSILLECTOMY     Family History:  Family History  Problem Relation Age of Onset   Hypertension Mother    Diabetes Mother    Hypertension Father    Heart failure Father    Diabetes Father    Family Psychiatric  History: See previous Social History:  Social History   Substance and Sexual Activity  Alcohol Use Yes   Alcohol/week: 24.0 standard drinks   Types: 24 Cans of beer per week   Comment: 4 gallons a day- pt reports more than this currently     Social History   Substance and Sexual Activity  Drug Use Not Currently   Types: Marijuana, Cocaine   Comment: Heroin addiction- denies current drug use    Social History   Socioeconomic History   Marital status: Single    Spouse name: Not on file   Number of children: 1   Years of education: 11   Highest  education level: 11th grade  Occupational History   Not on file  Tobacco Use   Smoking status: Every Day    Packs/day: 1.00    Types: Cigarettes   Smokeless tobacco: Current    Types: Chew  Vaping Use   Vaping Use: Never used  Substance and Sexual Activity   Alcohol use: Yes    Alcohol/week: 24.0 standard drinks    Types: 24 Cans of beer per week    Comment: 4 gallons a day- pt reports more than this currently   Drug use: Not Currently    Types: Marijuana, Cocaine    Comment: Heroin addiction- denies current drug use   Sexual activity: Not Currently  Other Topics Concern   Not on file  Social History Narrative   Not on file   Social Determinants of Health   Financial Resource Strain: Not on file  Food Insecurity: Not on file  Transportation Needs: Not on file  Physical Activity: Not on file  Stress: Not on file  Social Connections: Not on file   Additional Social History:                         Sleep: Fair  Appetite:  Fair  Current Medications: Current Facility-Administered Medications  Medication Dose Route Frequency Provider Last Rate Last Admin   acetaminophen (TYLENOL) tablet 650 mg  650 mg Oral Q6H PRN Gillermo Murdoch, NP   650 mg at 09/06/21 0809   alum & mag hydroxide-simeth (MAALOX/MYLANTA) 200-200-20 MG/5ML suspension 30 mL  30 mL Oral Q4H PRN Gillermo Murdoch, NP   30 mL at 09/01/21 0908   hydrOXYzine (ATARAX/VISTARIL) tablet 50 mg  50 mg Oral TID PRN Jesse Sans, MD   50 mg at 09/05/21 2102   hydrOXYzine (ATARAX/VISTARIL) tablet 50 mg  50 mg Oral Once Jesse Sans, MD       magnesium hydroxide (MILK OF MAGNESIA) suspension 30 mL  30 mL Oral Daily PRN Gillermo Murdoch, NP   30 mL at 09/05/21 2103   naltrexone (DEPADE) tablet 50 mg  50 mg Oral Daily Jesse Sans, MD   50 mg at 09/06/21 4081   nicotine polacrilex (NICORETTE) gum 2 mg  2 mg Oral Q4H PRN Jesse Sans, MD   2 mg at 09/06/21 1217   OLANZapine (ZYPREXA)  injection 10 mg  10 mg Intramuscular Q6H PRN Jesse Sans, MD       OLANZapine [facility]) tablet 10 mg  10 mg Oral Q6H PRN Jesse Sans, MD   10 mg at 09/06/21 1217   OLANZapine (ZYPREXA) tablet 20 mg  20 mg Oral QHS Sedric Guia T, MD   20 mg at 09/05/21 2102   ondansetron (ZOFRAN) tablet 8 mg  8 mg Oral Q8H PRN Jesse Sans, MD   8 mg at 09/02/21 1403   pantoprazole (PROTONIX) EC tablet 40 mg  40 mg Oral Daily Jesse Sans, MD   40 mg at 09/06/21 4481   thiamine tablet 100 mg  100 mg Oral Daily Gillermo Murdoch, NP   100 mg at 09/06/21 8563    Lab Results: No results found for this or any previous visit (from the past 48 hour(s)).  Blood Alcohol level:  Lab Results  Component Value Date   ETH <10 08/27/2021   ETH 329 (HH) 08/18/2021    Metabolic Disorder Labs: Lab Results  Component Value Date   HGBA1C 5.2 02/06/2021   MPG 103 02/06/2021   Lab Results  Component Value Date   PROLACTIN 7.0 04/23/2016   Lab Results  Component Value Date   CHOL 139 02/06/2021   TRIG 55 02/06/2021   HDL 55 02/06/2021   CHOLHDL 2.5 02/06/2021   VLDL 11 02/06/2021   LDLCALC 73 02/06/2021   LDLCALC 105 (H) 04/23/2016    Physical Findings: AIMS:  , ,  ,  ,    CIWA:    COWS:     Musculoskeletal: Strength & Muscle Tone: within normal limits Gait & Station: normal Patient leans: N/A  Psychiatric Specialty Exam:  Presentation  General Appearance: Disheveled  Eye Contact:Poor (wearing a beanie pulled over his eyes)  Speech:Normal Rate  Speech Volume:Normal  Handedness:Right   Mood and Affect  Mood:Anxious; Depressed  Affect:Tearful   Thought Process  Thought Processes:Goal Directed  Descriptions of Associations:Intact  Orientation:Full (Time, Place and Person)  Thought Content:Logical  History of Schizophrenia/Schizoaffective disorder:No  Duration of Psychotic Symptoms:No data recorded Hallucinations:No data recorded Ideas of  Reference:None  Suicidal Thoughts:No data recorded Homicidal Thoughts:No data recorded  Sensorium  Memory:Immediate Fair; Recent Fair; Remote Fair  Judgment:Impaired  Insight:Present   Executive Functions  Concentration:Fair  Attention Span:Fair  Recall:Fair  Fund of Knowledge:Fair  Language:Fair   Psychomotor Activity  Psychomotor Activity: No data recorded  Assets  Assets:Desire for Improvement; Intimacy; Physical Health; Social Support   Sleep  Sleep: No data recorded   Physical Exam: Physical Exam Vitals and nursing note reviewed.  Constitutional:      Appearance: Normal appearance.  HENT:     Head: Normocephalic and atraumatic.     Mouth/Throat:     Pharynx: Oropharynx is clear.  Eyes:     Pupils: Pupils are equal, round, and reactive to light.  Cardiovascular:     Rate and Rhythm: Normal rate and regular rhythm.  Pulmonary:     Effort: Pulmonary effort is normal.     Breath sounds: Normal breath sounds.  Abdominal:     General: Abdomen is flat.     Palpations: Abdomen is soft.  Musculoskeletal:        General: Normal range of motion.  Skin:    General: Skin is warm and dry.  Neurological:     General: No focal deficit present.     Mental Status: He is alert. Mental status is at baseline.  Psychiatric:        Mood and Affect: Mood normal.        Thought Content: Thought content normal.   Review of Systems  Constitutional: Negative.   HENT: Negative.    Eyes: Negative.   Respiratory: Negative.    Cardiovascular: Negative.   Gastrointestinal: Negative.   Musculoskeletal: Negative.   Skin: Negative.   Neurological: Negative.   Psychiatric/Behavioral: Negative.    Blood pressure 121/89, pulse 65, temperature 97.6 F (36.4 C), temperature source Oral, resp. rate 18, height 5\' 9"  (1.753 m), weight 59.9 kg, SpO2 98 %. Body mass index is 19.49 kg/m.   Treatment Plan Summary: Medication management and Plan no change to medication.   Encouraged patient to stay calm and be appropriate with staff attending groups get along with others.  We will follow up on substance abuse referrals tomorrow.  , MD 09/06/2021, 12:23 PM

## 2021-09-06 NOTE — Group Note (Signed)
LCSW Group Therapy Note  Group Date: 09/06/2021 Start Time: 1300 End Time: 1400   Type of Therapy and Topic:  Group Therapy - How To Cope with Nervousness about Discharge   Participation Level:  Active   Description of Group This process group involved identification of patients' feelings about discharge. Some of them are scheduled to be discharged soon, while others are new admissions, but each of them was asked to share thoughts and feelings surrounding discharge from the hospital. One common theme was that they are excited at the prospect of going home, while another was that many of them are apprehensive about sharing why they were hospitalized. Patients were given the opportunity to discuss these feelings with their peers in preparation for discharge.  Therapeutic Goals  Patient will identify their overall feelings about pending discharge. Patient will think about how they might proactively address issues that they believe will once again arise once they get home (i.e. with parents). Patients will participate in discussion about having hope for change.   Summary of Patient Progress: Patient was present and actively participated throughout the duration of group. Patient presented as engaged and cooperative. Patient identified himself as an alcoholic and stated that it is important that he participates in psychiatric medication management post discharge. Patient expressed apprehension regarding discharge because he has to "start over", stating he only has the belongings he arrived to the hospital with and his birth certificate. Patient expressed a preference to receive aftercare services at [facility].   Therapeutic Modalities Cognitive Behavioral Therapy   Norberto Sorenson, Theresia Majors 09/06/2021  3:05 PM

## 2021-09-07 DIAGNOSIS — F319 Bipolar disorder, unspecified: Secondary | ICD-10-CM | POA: Diagnosis not present

## 2021-09-07 NOTE — [facility] Counselor (Signed)
CSW faxed attorney letter to [facility] by patient request. CSW to follow up with treatment center. Situation ongoing, CSW will continue to monitor and update note as more information becomes available.   Signed:  [name], MSW, [place], LCASA 09/07/2021 11:41 AM

## 2021-09-07 NOTE — Plan of Care (Signed)
Patient endorses anxiety and depression   Problem: Education: Goal: Emotional status will improve Outcome: Not Progressing Goal: Mental status will improve Outcome: Not Progressing   

## 2021-09-07 NOTE — Progress Notes (Signed)
Patient calm and pleasant during assessment, but does present hyperactive on the unit. Pt observed interacting appropriately with staff and peers on the unit. Patient compliant with medication administration per MD orders. Pt given education, support, and encouragement to be active in his treatment plan. Pt being monitored Q 15 minutes for safety per unit protocol. Pt remains safe on the unit.

## 2021-09-07 NOTE — Progress Notes (Signed)
Patient has been hyper and pleasant. Denies SI HI and AVH. Socializing with same age peer, and silly at times.

## 2021-09-07 NOTE — Plan of Care (Signed)
  Problem: Education: Goal: Knowledge of [facility] Education information/materials will improve Outcome: Progressing Goal: Emotional status will improve Outcome: Progressing Goal: Mental status will improve Outcome: Progressing Goal: Verbalization of understanding the information provided will improve Outcome: Progressing   Problem: Activity: Goal: Interest or engagement in activities will improve Outcome: Progressing Goal: Sleeping patterns will improve Outcome: Progressing   Problem: Coping: Goal: Ability to verbalize frustrations and anger appropriately will improve Outcome: Progressing Goal: Ability to demonstrate self-control will improve Outcome: Progressing   Problem: Health Behavior/Discharge Planning: Goal: Identification of resources available to assist in meeting health care needs will improve Outcome: Progressing Goal: Compliance with treatment plan for underlying cause of condition will improve Outcome: Progressing   Problem: Physical Regulation: Goal: Ability to maintain clinical measurements within normal limits will improve Outcome: Progressing   Problem: Safety: Goal: Periods of time without injury will increase Outcome: Progressing   Problem: Education: Goal: Utilization of techniques to improve thought processes will improve Outcome: Progressing Goal: Knowledge of the prescribed therapeutic regimen will improve Outcome: Progressing   Problem: Activity: Goal: Interest or engagement in leisure activities will improve Outcome: Progressing Goal: Imbalance in normal sleep/wake cycle will improve Outcome: Progressing   Problem: Coping: Goal: Coping ability will improve Outcome: Progressing Goal: Will verbalize feelings Outcome: Progressing   Problem: Health Behavior/Discharge Planning: Goal: Ability to make decisions will improve Outcome: Progressing Goal: Compliance with therapeutic regimen will improve Outcome: Progressing    Problem: Role Relationship: Goal: Will demonstrate positive changes in social behaviors and relationships Outcome: Progressing   Problem: Safety: Goal: Ability to disclose and discuss suicidal ideas will improve Outcome: Progressing Goal: Ability to identify and utilize support systems that promote safety will improve Outcome: Progressing   Problem: Self-Concept: Goal: Will verbalize positive feelings about self Outcome: Progressing Goal: Level of anxiety will decrease Outcome: Progressing   Problem: Education: Goal: Ability to state activities that reduce stress will improve Outcome: Progressing   Problem: Coping: Goal: Ability to identify and develop effective coping behavior will improve Outcome: Progressing   Problem: Self-Concept: Goal: Ability to identify factors that promote anxiety will improve Outcome: Progressing Goal: Level of anxiety will decrease Outcome: Progressing Goal: Ability to modify response to factors that promote anxiety will improve Outcome: Progressing   Problem: Education: Goal: Ability to make informed decisions regarding treatment will improve Outcome: Progressing   Problem: Coping: Goal: Coping ability will improve Outcome: Progressing   Problem: Health Behavior/Discharge Planning: Goal: Identification of resources available to assist in meeting health care needs will improve Outcome: Progressing   Problem: Medication: Goal: Compliance with prescribed medication regimen will improve Outcome: Progressing   Problem: Self-Concept: Goal: Ability to disclose and discuss suicidal ideas will improve Outcome: Progressing Goal: Will verbalize positive feelings about self Outcome: Progressing   

## 2021-09-07 NOTE — Group Note (Signed)
[facility] LCSW Group Therapy Note    Group Date: 09/07/2021 Start Time: 1300 End Time: 1400  Type of Therapy and Topic:  Group Therapy:  Overcoming Obstacles  Participation Level:  [facility] PARTICIPATION LEVEL: Active  Mood: Euthymic   Description of Group:   In this group patients will be encouraged to explore what they see as obstacles to their own wellness and recovery. They will be guided to discuss their thoughts, feelings, and behaviors related to these obstacles. The group will process together ways to cope with barriers, with attention given to specific choices patients can make. Each patient will be challenged to identify changes they are motivated to make in order to overcome their obstacles. This group will be process-oriented, with patients participating in exploration of their own experiences as well as giving and receiving support and challenge from other group members.  Therapeutic Goals: 1. Patient will identify personal and current obstacles as they relate to admission. 2. Patient will identify barriers that currently interfere with their wellness or overcoming obstacles.  3. Patient will identify feelings, thought process and behaviors related to these barriers. 4. Patient will identify two changes they are willing to make to overcome these obstacles:    Summary of Patient Progress Patient was present for the entirety of the group session. Patient was an active listener and participated in the topic of discussion, provided helpful advice to others, and added nuance to topic of conversation. Patient shared that he would like to remain sober from alcohol, though he remains around people who are not supportive of his goals. Patient shared that he needs to be more confident and stern with others in sharing his goal of staying sober.    Therapeutic Modalities:   Cognitive Behavioral Therapy Solution Focused Therapy Motivational  Interviewing Relapse Prevention Therapy   [name], [place]

## 2021-09-07 NOTE — Progress Notes (Signed)
[facility] MD Progress Note  09/07/2021 3:00 PM Nathan [name]  MRN:  [id number] Subjective: Follow-up for this 35 year old man with a history of bipolar disorder and substance abuse.  Patient is now asking for discharge tomorrow.  He says that his mood is fine.  Denies any suicidal thoughts.  Not feeling overwhelmed or anxious.  He feels safe going to stay with someone else while still waiting for the possibility of substance abuse treatment.  No physical complaints.  Not causing any trouble on the unit.  Does not appear to be psychotic Principal Problem: Bipolar 1 disorder, depressed (HCC) Diagnosis: Principal Problem:   Bipolar 1 disorder, depressed (HCC) Active Problems:   Alcohol use disorder, severe, dependence (HCC)  Total Time spent with patient: 30 minutes  Past Psychiatric History: Past history of recurrent presentations with bipolar disorder depression agitation and mood swings all coupled with chronic substance abuse  Past Medical History:  Past Medical History:  Diagnosis Date   Anxiety    Bipolar 1 disorder (HCC)    Chronic headache    Depression 03/30/2020   ETOH abuse 03/30/2020   Hep C w/o coma, chronic (HCC)    Heroin addiction (HCC)    Horseshoe kidney    Marijuana smoker 03/30/2020    Past Surgical History:  Procedure Laterality Date   TONSILLECTOMY     Family History:  Family History  Problem Relation Age of Onset   Hypertension Mother    Diabetes Mother    Hypertension Father    Heart failure Father    Diabetes Father    Family Psychiatric  History: See previous Social History:  Social History   Substance and Sexual Activity  Alcohol Use Yes   Alcohol/week: 24.0 standard drinks   Types: 24 Cans of beer per week   Comment: 4 gallons a day- pt reports more than this currently     Social History   Substance and Sexual Activity  Drug Use Not Currently   Types: Marijuana, Cocaine   Comment: Heroin addiction- denies current drug use    Social  History   Socioeconomic History   Marital status: Single    Spouse name: Not on file   Number of children: 1   Years of education: 11   Highest education level: 11th grade  Occupational History   Not on file  Tobacco Use   Smoking status: Every Day    Packs/day: 1.00    Types: Cigarettes   Smokeless tobacco: Current    Types: Chew  Vaping Use   Vaping Use: Never used  Substance and Sexual Activity   Alcohol use: Yes    Alcohol/week: 24.0 standard drinks    Types: 24 Cans of beer per week    Comment: 4 gallons a day- pt reports more than this currently   Drug use: Not Currently    Types: Marijuana, Cocaine    Comment: Heroin addiction- denies current drug use   Sexual activity: Not Currently  Other Topics Concern   Not on file  Social History Narrative   Not on file   Social Determinants of Health   Financial Resource Strain: Not on file  Food Insecurity: Not on file  Transportation Needs: Not on file  Physical Activity: Not on file  Stress: Not on file  Social Connections: Not on file   Additional Social History:                         Sleep: Fair  Appetite:  Fair  Current Medications: Current Facility-Administered Medications  Medication Dose Route Frequency Provider Last Rate Last Admin   acetaminophen (TYLENOL) tablet 650 mg  650 mg Oral Q6H PRN Gillermo Murdoch, NP   650 mg at 09/07/21 0755   alum & mag hydroxide-simeth (MAALOX/MYLANTA) 200-200-20 MG/5ML suspension 30 mL  30 mL Oral Q4H PRN Gillermo Murdoch, NP   30 mL at 09/01/21 0908   hydrOXYzine (ATARAX/VISTARIL) tablet 50 mg  50 mg Oral TID PRN Jesse Sans, MD   50 mg at 09/07/21 1209   hydrOXYzine (ATARAX/VISTARIL) tablet 50 mg  50 mg Oral Once Jesse Sans, MD       magnesium hydroxide (MILK OF MAGNESIA) suspension 30 mL  30 mL Oral Daily PRN Gillermo Murdoch, NP   30 mL at 09/05/21 2103   naltrexone (DEPADE) tablet 50 mg  50 mg Oral Daily Jesse Sans, MD   50 mg  at 09/07/21 0755   nicotine polacrilex (NICORETTE) gum 2 mg  2 mg Oral Q4H PRN Jesse Sans, MD   2 mg at 09/07/21 1209   OLANZapine (ZYPREXA) injection 10 mg  10 mg Intramuscular Q6H PRN Jesse Sans, MD       OLANZapine [facility]) tablet 10 mg  10 mg Oral Q6H PRN Jesse Sans, MD   10 mg at 09/06/21 1217   OLANZapine (ZYPREXA) tablet 20 mg  20 mg Oral QHS Mikisha Roseland T, MD   20 mg at 09/06/21 2109   ondansetron (ZOFRAN) tablet 8 mg  8 mg Oral Q8H PRN Jesse Sans, MD   8 mg at 09/02/21 1403   pantoprazole (PROTONIX) EC tablet 40 mg  40 mg Oral Daily Jesse Sans, MD   40 mg at 09/07/21 0755   thiamine tablet 100 mg  100 mg Oral Daily Gillermo Murdoch, NP   100 mg at 09/07/21 6283    Lab Results: No results found for this or any previous visit (from the past 48 hour(s)).  Blood Alcohol level:  Lab Results  Component Value Date   ETH <10 08/27/2021   ETH 329 (HH) 08/18/2021    Metabolic Disorder Labs: Lab Results  Component Value Date   HGBA1C 5.2 02/06/2021   MPG 103 02/06/2021   Lab Results  Component Value Date   PROLACTIN 7.0 04/23/2016   Lab Results  Component Value Date   CHOL 139 02/06/2021   TRIG 55 02/06/2021   HDL 55 02/06/2021   CHOLHDL 2.5 02/06/2021   VLDL 11 02/06/2021   LDLCALC 73 02/06/2021   LDLCALC 105 (H) 04/23/2016    Physical Findings: AIMS:  , ,  ,  ,    CIWA:    COWS:     Musculoskeletal: Strength & Muscle Tone: within normal limits Gait & Station: normal Patient leans: N/A  Psychiatric Specialty Exam:  Presentation  General Appearance: Disheveled  Eye Contact:Poor (wearing a beanie pulled over his eyes)  Speech:Normal Rate  Speech Volume:Normal  Handedness:Right   Mood and Affect  Mood:Anxious; Depressed  Affect:Tearful   Thought Process  Thought Processes:Goal Directed  Descriptions of Associations:Intact  Orientation:Full (Time, Place and Person)  Thought Content:Logical  History of  Schizophrenia/Schizoaffective disorder:No  Duration of Psychotic Symptoms:No data recorded Hallucinations:No data recorded Ideas of Reference:None  Suicidal Thoughts:No data recorded Homicidal Thoughts:No data recorded  Sensorium  Memory:Immediate Fair; Recent Fair; Remote Fair  Judgment:Impaired  Insight:Present   Executive Functions  Concentration:Fair  Attention Span:Fair  Recall:Fair  Fund of  Knowledge:Fair  Language:Fair   Psychomotor Activity  Psychomotor Activity: No data recorded  Assets  Assets:Desire for Improvement; Intimacy; Physical Health; Social Support   Sleep  Sleep: No data recorded   Physical Exam: Physical Exam Vitals and nursing note reviewed.  Constitutional:      Appearance: Normal appearance.  HENT:     Head: Normocephalic and atraumatic.     Mouth/Throat:     Pharynx: Oropharynx is clear.  Eyes:     Pupils: Pupils are equal, round, and reactive to light.  Cardiovascular:     Rate and Rhythm: Normal rate and regular rhythm.  Pulmonary:     Effort: Pulmonary effort is normal.     Breath sounds: Normal breath sounds.  Abdominal:     General: Abdomen is flat.     Palpations: Abdomen is soft.  Musculoskeletal:        General: Normal range of motion.  Skin:    General: Skin is warm and dry.  Neurological:     General: No focal deficit present.     Mental Status: He is alert. Mental status is at baseline.  Psychiatric:        Mood and Affect: Mood normal.        Thought Content: Thought content normal.   Review of Systems  Constitutional: Negative.   HENT: Negative.    Eyes: Negative.   Respiratory: Negative.    Cardiovascular: Negative.   Gastrointestinal: Negative.   Musculoskeletal: Negative.   Skin: Negative.   Neurological: Negative.   Psychiatric/Behavioral: Negative.    Blood pressure 112/87, pulse 66, temperature 97.7 F (36.5 C), temperature source Oral, resp. rate 18, height 5\' 9"  (1.753 m), weight 59.9  kg, SpO2 98 %. Body mass index is 19.49 kg/m.   Treatment Plan Summary: Plan   continue current medication.  Spoke with him on for a while about the risks and benefits of discharge.  Patient feels confident that he would be able to maintain sobriety outside the hospital.  Does not appear to be psychotic.  He appears to be able to make decisions for himself.  We will go ahead and plan on discharge likely tomorrow.  , MD 09/07/2021, 3:00 PM

## 2021-09-07 NOTE — Progress Notes (Signed)
Pt went to all of the groups. He has been very pleasant and social. He states that he is "very hopeful". He has "good news" on his discharge plan. He has been med compliant and cooperative. Torrie Mayers RN

## 2021-09-08 ENCOUNTER — Other Ambulatory Visit: Payer: Self-pay

## 2021-09-08 DIAGNOSIS — F319 Bipolar disorder, unspecified: Secondary | ICD-10-CM | POA: Diagnosis not present

## 2021-09-08 MED ORDER — NALTREXONE HCL 50 MG PO TABS: 50.0000 mg | ORAL_TABLET | Freq: Every day | ORAL | 1 refills | Status: DC

## 2021-09-08 MED ORDER — FLUOXETINE HCL 20 MG PO CAPS: 20.0000 mg | ORAL_CAPSULE | Freq: Every day | ORAL | 1 refills | Status: DC

## 2021-09-08 MED ORDER — HYDROXYZINE HCL 50 MG PO TABS: 50.0000 mg | ORAL_TABLET | Freq: Three times a day (TID) | ORAL | 1 refills | Status: DC | PRN

## 2021-09-08 MED ORDER — OLANZAPINE 5 MG PO TABS: 15.0000 mg | ORAL_TABLET | Freq: Every day | ORAL | 1 refills | Status: DC

## 2021-09-08 NOTE — [facility] Suicide Risk Assessment (Signed)
[facility] Discharge Suicide Risk Assessment   Principal Problem: Bipolar 1 disorder, depressed (HCC) Discharge Diagnoses: Principal Problem:   Bipolar 1 disorder, depressed (HCC) Active Problems:   Alcohol use disorder, severe, dependence (HCC)   Total Time spent with patient: 45 minutes  Musculoskeletal: Strength & Muscle Tone: within normal limits Gait & Station: normal Patient leans: N/A  Psychiatric Specialty Exam  Presentation  General Appearance: Disheveled  Eye Contact:Poor (wearing a beanie pulled over his eyes)  Speech:Normal Rate  Speech Volume:Normal  Handedness:Right   Mood and Affect  Mood:Anxious; Depressed  Duration of Depression Symptoms: Greater than two weeks  Affect:Tearful   Thought Process  Thought Processes:Goal Directed  Descriptions of Associations:Intact  Orientation:Full (Time, Place and Person)  Thought Content:Logical  History of Schizophrenia/Schizoaffective disorder:No  Duration of Psychotic Symptoms:No data recorded Hallucinations:No data recorded Ideas of Reference:None  Suicidal Thoughts:No data recorded Homicidal Thoughts:No data recorded  Sensorium  Memory:Immediate Fair; Recent Fair; Remote Fair  Judgment:Impaired  Insight:Present   Executive Functions  Concentration:Fair  Attention Span:Fair  Recall:Fair  Fund of Knowledge:Fair  Language:Fair   Psychomotor Activity  Psychomotor Activity: No data recorded  Assets  Assets:Desire for Improvement; Intimacy; Physical Health; Social Support   Sleep  Sleep: No data recorded  Physical Exam: Physical Exam Vitals and nursing note reviewed.  Constitutional:      Appearance: Normal appearance.  HENT:     Head: Normocephalic and atraumatic.     Mouth/Throat:     Pharynx: Oropharynx is clear.  Eyes:     Pupils: Pupils are equal, round, and reactive to light.  Cardiovascular:     Rate and Rhythm: Normal rate and regular rhythm.  Pulmonary:      Effort: Pulmonary effort is normal.     Breath sounds: Normal breath sounds.  Abdominal:     General: Abdomen is flat.     Palpations: Abdomen is soft.  Musculoskeletal:        General: Normal range of motion.  Skin:    General: Skin is warm and dry.  Neurological:     General: No focal deficit present.     Mental Status: He is alert. Mental status is at baseline.  Psychiatric:        Attention and Perception: Attention normal.        Mood and Affect: Mood normal.        Speech: Speech normal.        Behavior: Behavior normal.        Thought Content: Thought content normal.        Cognition and Memory: Cognition normal.        Judgment: Judgment normal.   Review of Systems  Constitutional: Negative.   HENT: Negative.    Eyes: Negative.   Respiratory: Negative.    Cardiovascular: Negative.   Gastrointestinal: Negative.   Musculoskeletal: Negative.   Skin: Negative.   Neurological: Negative.   Psychiatric/Behavioral: Negative.    Blood pressure 124/82, pulse 68, temperature 97.9 F (36.6 C), temperature source Oral, resp. rate 18, height 5\' 9"  (1.753 m), weight 59.9 kg, SpO2 99 %. Body mass index is 19.49 kg/m.  Mental Status Per Nursing Assessment::   On Admission:  Suicidal ideation indicated by patient  Demographic Factors:  Male, Caucasian, and Low socioeconomic status  Loss Factors: Financial problems/change in socioeconomic status  Historical Factors: Impulsivity  Risk Reduction Factors:   Positive therapeutic relationship  Continued Clinical Symptoms:  Bipolar Disorder:   Mixed State Alcohol/Substance Abuse/Dependencies  Cognitive Features  That Contribute To Risk:  None    Suicide Risk:  Minimal: No identifiable suicidal ideation.  Patients presenting with no risk factors but with morbid ruminations; may be classified as minimal risk based on the severity of the depressive symptoms   Follow-up Information     [facility] Follow up.    Why: Your appointment is scheduled for 09/09/2021 at 7AM with Lorella Nimrod, Peer Support Specialist.  Thanks! Contact information: [address] [place] [address] [phone]                 Plan Of Care/Follow-up recommendations:  Other:    Patient currently denying any suicidal ideation.  Affect upbeat.  Mood stated as improved.  Patient is lucid does not appear to be manic.  He agrees to outpatient treatment and continued medication management and follow up with substance abuse treatment.  Mordecai Rasmussen, MD 09/08/2021, 9:51 AM

## 2021-09-08 NOTE — Progress Notes (Signed)
D: Pt alert and oriented. Pt denies experiencing any pain, SI/HI, or AVH at this time. Pt reports he will be able to keep himself safe when he returns home.   A: Pt received discharge and medication education/information. Pt belongings were returned and signed for at this time.   R: Pt verbalized understanding of discharge and medication education/information.  Pt escorted by staff to medical mall front lobby where pt left to take public transportation.

## 2021-09-08 NOTE — Progress Notes (Signed)
D: Pt alert and oriented. Pt rates depression 4/10, and anxiety 6/10. Pt reports experiencing 7/10 headache pain at this time, prn meds given. Pt denies experiencing any SI/HI, or AVH at this time.   A: Scheduled medications administered to pt, per MD orders. Support and encouragement provided. Frequent verbal contact made. Routine safety checks conducted q15 minutes.   R: No adverse drug reactions noted. Pt verbally contracts for safety at this time. Pt complaint with medications and treatment plan. Pt interacts well with others on the unit. Pt remains safe at this time. Will continue to monitor.

## 2021-09-08 NOTE — [facility] Suicide Risk Assessment (Signed)
[facility] INPATIENT:  Family/Significant Other Suicide Prevention Education  Suicide Prevention Education:  Patient Refusal for Family/Significant Other Suicide Prevention Education: The patient Nathan [name] has refused to provide written consent for family/significant other to be provided Family/Significant Other Suicide Prevention Education during admission and/or prior to discharge.  Physician notified.  Harden Mo 09/08/2021, 9:40 AM

## 2021-09-08 NOTE — Discharge Summary (Signed)
Physician Discharge Summary Note  Patient:  Nathan [name] is an 35 y.o., male MRN:  [id number] DOB:  [birth year] Patient phone:  [phone] (home)  Patient address:   [address] [place] [address],  Total Time spent with patient: 45 minutes  Date of Admission:  08/27/2021 Date of Discharge: 09/08/2021  Reason for Admission: Admitted through the emergency room where he presented with suicidal ideation and depression and agitation disorganized thinking  Principal Problem: Bipolar 1 disorder, depressed (HCC) Discharge Diagnoses: Principal Problem:   Bipolar 1 disorder, depressed (HCC) Active Problems:   Alcohol use disorder, severe, dependence (HCC)   Past Psychiatric History: Past history of bipolar disorder with vivid rapid mood swings agitation and frequent suicidal ideation complicated by chronic substance abuse.  Multiple prior hospitalizations.  Much improved symptomatically when on medication and not abusing drugs  Past Medical History:  Past Medical History:  Diagnosis Date   Anxiety    Bipolar 1 disorder (HCC)    Chronic headache    Depression 03/30/2020   ETOH abuse 03/30/2020   Hep C w/o coma, chronic (HCC)    Heroin addiction (HCC)    Horseshoe kidney    Marijuana smoker 03/30/2020    Past Surgical History:  Procedure Laterality Date   TONSILLECTOMY     Family History:  Family History  Problem Relation Age of Onset   Hypertension Mother    Diabetes Mother    Hypertension Father    Heart failure Father    Diabetes Father    Family Psychiatric  History: See previous Social History:  Social History   Substance and Sexual Activity  Alcohol Use Yes   Alcohol/week: 24.0 standard drinks   Types: 24 Cans of beer per week   Comment: 4 gallons a day- pt reports more than this currently     Social History   Substance and Sexual Activity  Drug Use Not Currently   Types: Marijuana, Cocaine   Comment: Heroin addiction- denies current drug  use    Social History   Socioeconomic History   Marital status: Single    Spouse name: Not on file   Number of children: 1   Years of education: 11   Highest education level: 11th grade  Occupational History   Not on file  Tobacco Use   Smoking status: Every Day    Packs/day: 1.00    Types: Cigarettes   Smokeless tobacco: Current    Types: Chew  Vaping Use   Vaping Use: Never used  Substance and Sexual Activity   Alcohol use: Yes    Alcohol/week: 24.0 standard drinks    Types: 24 Cans of beer per week    Comment: 4 gallons a day- pt reports more than this currently   Drug use: Not Currently    Types: Marijuana, Cocaine    Comment: Heroin addiction- denies current drug use   Sexual activity: Not Currently  Other Topics Concern   Not on file  Social History Narrative   Not on file   Social Determinants of Health   Financial Resource Strain: Not on file  Food Insecurity: Not on file  Transportation Needs: Not on file  Physical Activity: Not on file  Stress: Not on file  Social Connections: Not on file    Hospital Course: Patient admitted to psychiatric ward.  Maintained on 15-minute checks.  Engaged in individual and group therapy.  Medication management for his history of bipolar disorder.  Patient tolerated medicine well.  Slept better.  Showed no problematic or dangerous behavior on the unit.  Worked with treatment team on applying for rehab facilities.  Tentatively accepted to [facility] but no current bed available patient requests discharge to outpatient while he waits for a bed.  Agrees to follow-up with local mental Health Center.  Physical Findings: AIMS:  , ,  ,  ,    CIWA:    COWS:     Musculoskeletal: Strength & Muscle Tone: within normal limits Gait & Station: normal Patient leans: N/A   Psychiatric Specialty Exam:  Presentation  General Appearance: Disheveled  Eye Contact:Poor (wearing a beanie pulled over his eyes)  Speech:Normal Rate  Speech  Volume:Normal  Handedness:Right   Mood and Affect  Mood:Anxious; Depressed  Affect:Tearful   Thought Process  Thought Processes:Goal Directed  Descriptions of Associations:Intact  Orientation:Full (Time, Place and Person)  Thought Content:Logical  History of Schizophrenia/Schizoaffective disorder:No  Duration of Psychotic Symptoms:No data recorded Hallucinations:No data recorded Ideas of Reference:None  Suicidal Thoughts:No data recorded Homicidal Thoughts:No data recorded  Sensorium  Memory:Immediate Fair; Recent Fair; Remote Fair  Judgment:Impaired  Insight:Present   Executive Functions  Concentration:Fair  Attention Span:Fair  Recall:Fair  Fund of Knowledge:Fair  Language:Fair   Psychomotor Activity  Psychomotor Activity: No data recorded  Assets  Assets:Desire for Improvement; Intimacy; Physical Health; Social Support   Sleep  Sleep: No data recorded   Physical Exam: Physical Exam Vitals and nursing note reviewed.  Constitutional:      Appearance: Normal appearance.  HENT:     Head: Normocephalic and atraumatic.     Mouth/Throat:     Pharynx: Oropharynx is clear.  Eyes:     Pupils: Pupils are equal, round, and reactive to light.  Cardiovascular:     Rate and Rhythm: Normal rate and regular rhythm.  Pulmonary:     Effort: Pulmonary effort is normal.     Breath sounds: Normal breath sounds.  Abdominal:     General: Abdomen is flat.     Palpations: Abdomen is soft.  Musculoskeletal:        General: Normal range of motion.  Skin:    General: Skin is warm and dry.  Neurological:     General: No focal deficit present.     Mental Status: He is alert. Mental status is at baseline.  Psychiatric:        Mood and Affect: Mood normal.        Thought Content: Thought content normal.   Review of Systems  Constitutional: Negative.   HENT: Negative.    Eyes: Negative.   Respiratory: Negative.    Cardiovascular: Negative.    Gastrointestinal: Negative.   Musculoskeletal: Negative.   Skin: Negative.   Neurological: Negative.   Psychiatric/Behavioral: Negative.    Blood pressure 124/82, pulse 68, temperature 97.9 F (36.6 C), temperature source Oral, resp. rate 18, height 5\' 9"  (1.753 m), weight 59.9 kg, SpO2 99 %. Body mass index is 19.49 kg/m.   Social History   Tobacco Use  Smoking Status Every Day   Packs/day: 1.00   Types: Cigarettes  Smokeless Tobacco Current   Types: Chew   Tobacco Cessation:  N/A, patient does not currently use tobacco products   Blood Alcohol level:  Lab Results  Component Value Date   ETH <10 08/27/2021   ETH 329 (HH) 08/18/2021    Metabolic Disorder Labs:  Lab Results  Component Value Date   HGBA1C 5.2 02/06/2021   MPG 103 02/06/2021   Lab Results  Component Value Date  PROLACTIN 7.0 04/23/2016   Lab Results  Component Value Date   CHOL 139 02/06/2021   TRIG 55 02/06/2021   HDL 55 02/06/2021   CHOLHDL 2.5 02/06/2021   VLDL 11 02/06/2021   LDLCALC 73 02/06/2021   LDLCALC 105 (H) 04/23/2016    See Psychiatric Specialty Exam and Suicide Risk Assessment completed by Attending Physician prior to discharge.  Discharge destination:  Home  Is patient on multiple antipsychotic therapies at discharge:  No   Has Patient had three or more failed trials of antipsychotic monotherapy by history:  No  Recommended Plan for Multiple Antipsychotic Therapies: NA  Discharge Instructions     Diet - low sodium heart healthy   Complete by: As directed    Increase activity slowly   Complete by: As directed       Allergies as of 09/08/2021       Reactions   Geodon [ziprasidone Hcl] Other (See Comments)   Seizures   Valproic Acid    Levofloxacin Itching        Medication List     STOP taking these medications    lamoTRIgine 100 MG tablet Commonly known as: LaMICtal   lamoTRIgine 25 MG tablet Commonly known as: LAMICTAL   thiamine 100 MG  tablet Commonly known as: VITAMIN B-1   traZODone 100 MG tablet Commonly known as: DESYREL       TAKE these medications      Indication  FLUoxetine 20 MG capsule Commonly known as: PROZAC Take 1 capsule (20 mg total) by mouth at bedtime.  Indication: Depression   hydrOXYzine 50 MG tablet Commonly known as: ATARAX/VISTARIL Take 1 tablet (50 mg total) by mouth 3 (three) times daily as needed for anxiety. What changed:  medication strength how much to take  Indication: Feeling Anxious   naltrexone 50 MG tablet Commonly known as: DEPADE Take 1 tablet (50 mg total) by mouth daily.  Indication: Abuse or Misuse of Alcohol   OLANZapine 5 MG tablet Commonly known as: ZYPREXA Take 3 tablets (15 mg total) by mouth at bedtime.  Indication: Depressive Phase of Manic-Depression        Follow-up Information     [facility] Follow up.   Why: Your appointment is scheduled for 09/09/2021 at 7AM with Lorella Nimrod, Peer Support Specialist.  Thanks! Contact information: [address] [place] [address] [phone]                 Follow-up recommendations:  Other:    Psychoeducation and supportive counseling and review of treatment plan.  Follow up with [facility].  Follow-up with application to rehab mental health and substance abuse treatment.  Continue current medicine.  Comments: Supply of medicine and prescriptions provided at discharge  Signed: Mordecai Rasmussen, MD 09/08/2021, 9:55 AM

## 2021-09-08 NOTE — Progress Notes (Signed)
  [facility] Adult Case Management Discharge Plan :  Will you be returning to the same living situation after discharge:  No.  Patient reports that he will be staying with a peer in [place]. At discharge, do you have transportation home?: Yes,  patient reports that he will take [employer]. Do you have the ability to pay for your medications: No.  Release of information consent forms completed and in the chart;  Patient's signature needed at discharge.  Patient to Follow up at:  Follow-up Information     [facility] Follow up.   Why: Your appointment is scheduled for 09/09/2021 at 7AM with Lorella Nimrod, Peer Support Specialist.  Thanks! Contact information: [address] [place] [address] [phone]                 Next level of care provider has access to [facility] Link:no  Safety Planning and Suicide Prevention discussed: Yes,  SPE completed with the patient.     Has patient been referred to the Quitline?: Patient refused referral  Patient has been referred for addiction treatment: Pt. refused referral  Harden Mo, LCSW 09/08/2021, 9:37 AM

## 2021-09-29 ENCOUNTER — Telehealth: Payer: Self-pay | Admitting: Gerontology

## 2021-09-29 NOTE — Telephone Encounter (Signed)
-----   Message from Rolm Gala, NP sent at 09/29/2021 10:07 AM EDT ----- Pls call and schedule a new patient appointment for Mr Coia, make a telephone note. Thank you

## 2021-11-10 ENCOUNTER — Inpatient Hospital Stay
Admission: AD | Admit: 2021-11-10 | Discharge: 2021-11-13 | DRG: 885 | Disposition: A | Discharge status: Home / Self Care | Payer: 59 | Source: Intra-hospital | Attending: Psychiatry | Admitting: Psychiatry

## 2021-11-10 ENCOUNTER — Emergency Department
Admission: EM | Admit: 2021-11-10 | Discharge: 2021-11-10 | Disposition: A | Discharge status: Other Acute Inpatient Hospital | Payer: Self-pay | Attending: Student in an Organized Health Care Education/Training Program | Admitting: Student in an Organized Health Care Education/Training Program

## 2021-11-10 ENCOUNTER — Other Ambulatory Visit: Payer: Self-pay

## 2021-11-10 ENCOUNTER — Encounter: Payer: Self-pay | Admitting: Emergency Medicine

## 2021-11-10 ENCOUNTER — Encounter: Payer: Self-pay | Admitting: Psychiatry

## 2021-11-10 DIAGNOSIS — Z79899 Other long term (current) drug therapy: Secondary | ICD-10-CM | POA: Diagnosis not present

## 2021-11-10 DIAGNOSIS — F259 Schizoaffective disorder, unspecified: Secondary | ICD-10-CM | POA: Diagnosis present

## 2021-11-10 DIAGNOSIS — Q631 Lobulated, fused and horseshoe kidney: Secondary | ICD-10-CM | POA: Diagnosis not present

## 2021-11-10 DIAGNOSIS — F22 Delusional disorders: Secondary | ICD-10-CM | POA: Diagnosis present

## 2021-11-10 DIAGNOSIS — Z888 Allergy status to other drugs, medicaments and biological substances status: Secondary | ICD-10-CM | POA: Diagnosis not present

## 2021-11-10 DIAGNOSIS — Z881 Allergy status to other antibiotic agents status: Secondary | ICD-10-CM

## 2021-11-10 DIAGNOSIS — R45851 Suicidal ideations: Secondary | ICD-10-CM | POA: Diagnosis present

## 2021-11-10 DIAGNOSIS — R519 Headache, unspecified: Secondary | ICD-10-CM | POA: Diagnosis present

## 2021-11-10 DIAGNOSIS — F17210 Nicotine dependence, cigarettes, uncomplicated: Secondary | ICD-10-CM | POA: Insufficient documentation

## 2021-11-10 DIAGNOSIS — F10129 Alcohol abuse with intoxication, unspecified: Secondary | ICD-10-CM | POA: Insufficient documentation

## 2021-11-10 DIAGNOSIS — K0889 Other specified disorders of teeth and supporting structures: Secondary | ICD-10-CM

## 2021-11-10 DIAGNOSIS — F1220 Cannabis dependence, uncomplicated: Secondary | ICD-10-CM | POA: Diagnosis present

## 2021-11-10 DIAGNOSIS — B182 Chronic viral hepatitis C: Secondary | ICD-10-CM | POA: Diagnosis present

## 2021-11-10 DIAGNOSIS — F419 Anxiety disorder, unspecified: Secondary | ICD-10-CM | POA: Diagnosis present

## 2021-11-10 DIAGNOSIS — F10939 Alcohol use, unspecified with withdrawal, unspecified: Secondary | ICD-10-CM | POA: Diagnosis present

## 2021-11-10 DIAGNOSIS — F609 Personality disorder, unspecified: Secondary | ICD-10-CM | POA: Diagnosis present

## 2021-11-10 DIAGNOSIS — Y908 Blood alcohol level of 240 mg/100 ml or more: Secondary | ICD-10-CM | POA: Insufficient documentation

## 2021-11-10 DIAGNOSIS — F17200 Nicotine dependence, unspecified, uncomplicated: Secondary | ICD-10-CM | POA: Diagnosis present

## 2021-11-10 DIAGNOSIS — F1010 Alcohol abuse, uncomplicated: Secondary | ICD-10-CM | POA: Diagnosis present

## 2021-11-10 DIAGNOSIS — Z20822 Contact with and (suspected) exposure to covid-19: Secondary | ICD-10-CM | POA: Insufficient documentation

## 2021-11-10 DIAGNOSIS — F332 Major depressive disorder, recurrent severe without psychotic features: Secondary | ICD-10-CM | POA: Diagnosis present

## 2021-11-10 DIAGNOSIS — F19980 Other psychoactive substance use, unspecified with psychoactive substance-induced anxiety disorder: Secondary | ICD-10-CM | POA: Diagnosis present

## 2021-11-10 DIAGNOSIS — F1020 Alcohol dependence, uncomplicated: Secondary | ICD-10-CM | POA: Diagnosis present

## 2021-11-10 DIAGNOSIS — F1120 Opioid dependence, uncomplicated: Secondary | ICD-10-CM | POA: Diagnosis present

## 2021-11-10 DIAGNOSIS — F333 Major depressive disorder, recurrent, severe with psychotic symptoms: Secondary | ICD-10-CM | POA: Insufficient documentation

## 2021-11-10 DIAGNOSIS — F319 Bipolar disorder, unspecified: Secondary | ICD-10-CM | POA: Diagnosis present

## 2021-11-10 DIAGNOSIS — F3163 Bipolar disorder, current episode mixed, severe, without psychotic features: Secondary | ICD-10-CM | POA: Diagnosis present

## 2021-11-10 LAB — ETHANOL: Alcohol, Ethyl (B): 431 mg/dL (ref <10)

## 2021-11-10 LAB — BASIC METABOLIC PANEL
Anion gap: 13 (ref 5–15)
BUN: 12 mg/dL (ref 6–20)
CO2: 23 mmol/L (ref 22–32)
Calcium: 8.8 mg/dL — ABNORMAL LOW (ref 8.9–10.3)
Chloride: 103 mmol/L (ref 98–111)
Creatinine, Ser: 0.71 mg/dL (ref 0.61–1.24)
GFR, Estimated: >60 mL/min (ref >60)
Glucose, Bld: 169 mg/dL — ABNORMAL HIGH (ref 70–99)
Potassium: 3.2 mmol/L — ABNORMAL LOW (ref 3.5–5.1)
Sodium: 139 mmol/L (ref 135–145)

## 2021-11-10 LAB — CBC
HCT: 45.4 % (ref 39.0–52.0)
Hemoglobin: 15.7 g/dL (ref 13.0–17.0)
MCH: 33.2 pg (ref 26.0–34.0)
MCHC: 34.6 g/dL (ref 30.0–36.0)
MCV: 96 fL (ref 80.0–100.0)
Platelets: 309 10*3/uL (ref 150–400)
RBC: 4.73 MIL/uL (ref 4.22–5.81)
RDW: 13.9 % (ref 11.5–15.5)
WBC: 11.9 10*3/uL — ABNORMAL HIGH (ref 4.0–10.5)
nRBC: 0 % (ref 0.0–0.2)

## 2021-11-10 LAB — URINE DRUG SCREEN, QUALITATIVE (ARMC ONLY)
Amphetamines, Ur Screen: NOT DETECTED
Barbiturates, Ur Screen: NOT DETECTED
Benzodiazepine, Ur Scrn: NOT DETECTED
Cannabinoid 50 Ng, Ur ~~LOC~~: POSITIVE — AB
Cocaine Metabolite,Ur ~~LOC~~: NOT DETECTED
MDMA (Ecstasy)Ur Screen: NOT DETECTED
Methadone Scn, Ur: NOT DETECTED
Opiate, Ur Screen: NOT DETECTED
Phencyclidine (PCP) Ur S: NOT DETECTED
Tricyclic, Ur Screen: NOT DETECTED

## 2021-11-10 LAB — RESP PANEL BY RT-PCR (FLU A&B, COVID) ARPGX2
Influenza A by PCR: NEGATIVE
Influenza B by PCR: NEGATIVE
SARS Coronavirus 2 by RT PCR: NEGATIVE

## 2021-11-10 LAB — SALICYLATE LEVEL: Salicylate Lvl: <7 mg/dL — ABNORMAL LOW (ref 7.0–30.0)

## 2021-11-10 LAB — ACETAMINOPHEN LEVEL: Acetaminophen (Tylenol), Serum: <10 ug/mL — ABNORMAL LOW (ref 10–30)

## 2021-11-10 MED ORDER — MAGNESIUM HYDROXIDE 400 MG/5ML PO SUSP: 30.0000 mL | Freq: Every day | ORAL | Status: DC | PRN

## 2021-11-10 MED ORDER — THIAMINE HCL 100 MG PO TABS
100.0000 mg | ORAL_TABLET | Freq: Every day | ORAL | Status: DC
  Administered 2021-11-10: 100 mg via ORAL
  Filled 2021-11-10: qty 1

## 2021-11-10 MED ORDER — THIAMINE HCL 100 MG/ML IJ SOLN: 100.0000 mg | Freq: Every day | INTRAMUSCULAR | Status: DC

## 2021-11-10 MED ORDER — ACETAMINOPHEN 325 MG PO TABS
650.0000 mg | ORAL_TABLET | Freq: Four times a day (QID) | ORAL | Status: DC | PRN
  Administered 2021-11-10 – 2021-11-13 (×6): 650 mg via ORAL
  Filled 2021-11-10 (×6): qty 2

## 2021-11-10 MED ORDER — LORAZEPAM 2 MG PO TABS: 0.0000 mg | ORAL_TABLET | Freq: Four times a day (QID) | ORAL | Status: DC

## 2021-11-10 MED ORDER — LORAZEPAM 2 MG PO TABS
0.0000 mg | ORAL_TABLET | Freq: Four times a day (QID) | ORAL | Status: AC
  Administered 2021-11-11 (×3): 2 mg via ORAL
  Administered 2021-11-12: 1 mg via ORAL
  Administered 2021-11-12: 2 mg via ORAL
  Filled 2021-11-10 (×5): qty 1

## 2021-11-10 MED ORDER — LORAZEPAM 2 MG/ML IJ SOLN: 0.0000 mg | Freq: Four times a day (QID) | INTRAMUSCULAR | Status: AC

## 2021-11-10 MED ORDER — ACETAMINOPHEN 325 MG PO TABS
650.0000 mg | ORAL_TABLET | Freq: Once | ORAL | Status: AC
  Administered 2021-11-10: 650 mg via ORAL
  Filled 2021-11-10: qty 2

## 2021-11-10 MED ORDER — LORAZEPAM 2 MG PO TABS
0.0000 mg | ORAL_TABLET | Freq: Two times a day (BID) | ORAL | Status: DC
  Administered 2021-11-12 – 2021-11-13 (×2): 2 mg via ORAL
  Filled 2021-11-10 (×2): qty 1

## 2021-11-10 MED ORDER — LORAZEPAM 2 MG/ML IJ SOLN: 0.0000 mg | Freq: Two times a day (BID) | INTRAMUSCULAR | Status: DC

## 2021-11-10 MED ORDER — ACETAMINOPHEN 500 MG PO TABS
1000.0000 mg | ORAL_TABLET | Freq: Once | ORAL | Status: AC
  Administered 2021-11-10: 1000 mg via ORAL
  Filled 2021-11-10: qty 2

## 2021-11-10 MED ORDER — THIAMINE HCL 100 MG PO TABS
100.0000 mg | ORAL_TABLET | Freq: Every day | ORAL | Status: DC
  Administered 2021-11-11 – 2021-11-12 (×2): 100 mg via ORAL
  Filled 2021-11-10 (×3): qty 1

## 2021-11-10 MED ORDER — LORAZEPAM 2 MG PO TABS
0.0000 mg | ORAL_TABLET | Freq: Four times a day (QID) | ORAL | Status: DC
  Administered 2021-11-10: 1 mg via ORAL
  Filled 2021-11-10: qty 1

## 2021-11-10 MED ORDER — LORAZEPAM 2 MG PO TABS: 0.0000 mg | ORAL_TABLET | Freq: Two times a day (BID) | ORAL | Status: DC

## 2021-11-10 MED ORDER — LORAZEPAM 2 MG PO TABS
0.0000 mg | ORAL_TABLET | Freq: Two times a day (BID) | ORAL | Status: DC
Start: 2021-11-12 — End: 2021-11-10

## 2021-11-10 MED ORDER — PROMETHAZINE HCL 25 MG PO TABS
25.0000 mg | ORAL_TABLET | Freq: Once | ORAL | Status: AC
  Administered 2021-11-10: 25 mg via ORAL
  Filled 2021-11-10: qty 1

## 2021-11-10 MED ORDER — LORAZEPAM 2 MG/ML IJ SOLN: 0.0000 mg | Freq: Four times a day (QID) | INTRAMUSCULAR | Status: DC

## 2021-11-10 MED ORDER — LORAZEPAM 2 MG/ML IJ SOLN
INTRAMUSCULAR | Status: AC
  Administered 2021-11-10: 2 mg
  Filled 2021-11-10: qty 1

## 2021-11-10 MED ORDER — ALUM & MAG HYDROXIDE-SIMETH 200-200-20 MG/5ML PO SUSP: 30.0000 mL | ORAL | Status: DC | PRN

## 2021-11-10 NOTE — BH Assessment (Signed)
TTS and NP, Louise met with patient for reassessment. Patient agitated and initially did not want to engage. Patient reports he is in pain and having thoughts of suicide. Patient states "he does not know why he is still on earth and wants help for his mental health." Patient denies HI/AH/VH. Patient is not currently taking any medications.   Per Barbaraann Share, NP, patient is recommended for inpatient psychiatric admission.

## 2021-11-10 NOTE — BH Assessment (Signed)
Patient is under review at [facility]. Awaiting accepting details.

## 2021-11-10 NOTE — ED Notes (Signed)
Report given to [facility]. Preparing pt for transport to lower level.

## 2021-11-10 NOTE — ED Notes (Signed)
Pt ambulated to bathroom to preform ADL's no assistance required 

## 2021-11-10 NOTE — ED Notes (Signed)
C/o mild chest pain. EKG obtained.

## 2021-11-10 NOTE — ED Notes (Signed)
[facility]  GOING  TO  [facility]  TONIGHT 

## 2021-11-10 NOTE — ED Notes (Signed)
[facility]/Psych Consult ordered 
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
## 2021-11-10 NOTE — ED Notes (Signed)
Called [employer] to get Custody order signed/ Waiting on them to arrive

## 2021-11-10 NOTE — ED Notes (Signed)
Patient provided snack at appropriate snack time.  Pt consumed 100% of snack provided, tolerated well w/o complaints   Trash disposted of appropriately by patient.  

## 2021-11-10 NOTE — ED Notes (Signed)
Pt given a cup of milk and a sandwhich tray

## 2021-11-10 NOTE — ED Notes (Signed)
Pt was given snack 

## 2021-11-10 NOTE — Consult Note (Signed)
[facility] Face-to-Face Psychiatry Consult   Reason for Consult:  re-evaluation Referring Physician:  EDP Patient Identification: Nathan [name] MRN:  [id number] Principal Diagnosis: Alcohol abuse Diagnosis:  Principal Problem:   Alcohol abuse Active Problems:   Suicidal ideation   Bipolar 1 disorder, mixed, severe (HCC)   Tobacco use disorder   Bipolar 1 disorder, depressed (HCC)   Schizoaffective disorder (HCC)   Alcohol use disorder, severe, dependence (HCC)   Cannabis use disorder, moderate, in controlled environment (HCC)   Moderate alcohol use disorder (HCC)   Severe recurrent major depression without psychotic features (HCC)   Alcohol withdrawal (HCC)   Bipolar depression (HCC)   Bipolar 1 disorder (HCC)   Total Time spent with patient: 1 hour  Subjective:  "I don't know why I am here in this world"   Nathan [name] is a 35 y.o. male patient admitted with suicidal thoughts in the context of alcohol intoxication.   HPI:  See previous. Chart reviewed and patient seen face-to-face for re-assessment.  Patient continues to insist that he can "take himself out several different ways."  Patient is very irritable this morning, complaining of somatic issues, as well as suicidal thoughts.  He states "I am lost.  I do not know what I need, except I need help."  Patient denies auditory or visual hallucinations.  Denies homicidal ideations.  Past Psychiatric History: see previous  Risk to Self:   Risk to Others:   Prior Inpatient Therapy:   Prior Outpatient Therapy:    Past Medical History:  Past Medical History:  Diagnosis Date   Anxiety    Bipolar 1 disorder (HCC)    Chronic headache    Depression 03/30/2020   ETOH abuse 03/30/2020   Hep C w/o coma, chronic (HCC)    Heroin addiction (HCC)    Horseshoe kidney    Marijuana smoker 03/30/2020    Past Surgical History:  Procedure Laterality Date   TONSILLECTOMY     Family History:  Family History  Problem Relation  Age of Onset   Hypertension Mother    Diabetes Mother    Hypertension Father    Heart failure Father    Diabetes Father    Family Psychiatric  History: see previous Social History:  Social History   Substance and Sexual Activity  Alcohol Use Yes   Alcohol/week: 24.0 standard drinks   Types: 24 Cans of beer per week   Comment: 4 gallons a day- pt reports more than this currently     Social History   Substance and Sexual Activity  Drug Use Not Currently   Types: Marijuana, Cocaine   Comment: Heroin addiction- denies current drug use    Social History   Socioeconomic History   Marital status: Single    Spouse name: Not on file   Number of children: 1   Years of education: 11   Highest education level: 11th grade  Occupational History   Not on file  Tobacco Use   Smoking status: Every Day    Packs/day: 1.00    Types: Cigarettes   Smokeless tobacco: Current    Types: Chew  Vaping Use   Vaping Use: Never used  Substance and Sexual Activity   Alcohol use: Yes    Alcohol/week: 24.0 standard drinks    Types: 24 Cans of beer per week    Comment: 4 gallons a day- pt reports more than this currently   Drug use: Not Currently    Types: Marijuana, Cocaine  Comment: Heroin addiction- denies current drug use   Sexual activity: Not Currently  Other Topics Concern   Not on file  Social History Narrative   Not on file   Social Determinants of Health   Financial Resource Strain: Not on file  Food Insecurity: Not on file  Transportation Needs: Not on file  Physical Activity: Not on file  Stress: Not on file  Social Connections: Not on file   Additional Social History:    Allergies:   Allergies  Allergen Reactions   Geodon [Ziprasidone Hcl] Other (See Comments)    Seizures   Valproic Acid    Levofloxacin Itching    Labs:  Results for orders placed or performed during the hospital encounter of 11/10/21 (from the past 48 hour(s))  CBC     Status: Abnormal    Collection Time: 11/10/21  2:55 AM  Result Value Ref Range   WBC 11.9 (H) 4.0 - 10.5 K/uL   RBC 4.73 4.22 - 5.81 MIL/uL   Hemoglobin 15.7 13.0 - 17.0 g/dL   HCT 99.8 33.8 - 25.0 %   MCV 96.0 80.0 - 100.0 fL   MCH 33.2 26.0 - 34.0 pg   MCHC 34.6 30.0 - 36.0 g/dL   RDW 53.9 76.7 - 34.1 %   Platelets 309 150 - 400 K/uL   nRBC 0.0 0.0 - 0.2 %    Comment: Performed at [facility], [address], [place], [place] [address]  Basic metabolic panel     Status: Abnormal   Collection Time: 11/10/21  2:55 AM  Result Value Ref Range   Sodium 139 135 - 145 mmol/L   Potassium 3.2 (L) 3.5 - 5.1 mmol/L   Chloride 103 98 - 111 mmol/L   CO2 23 22 - 32 mmol/L   Glucose, Bld 169 (H) 70 - 99 mg/dL    Comment: Glucose reference range applies only to samples taken after fasting for at least 8 hours.   BUN 12 6 - 20 mg/dL   Creatinine, Ser 2.40 0.61 - 1.24 mg/dL   Calcium 8.8 (L) 8.9 - 10.3 mg/dL   GFR, Estimated >97 >35 mL/min    Comment: (NOTE) Calculated using the CKD-EPI Creatinine Equation (2021)    Anion gap 13 5 - 15    Comment: Performed at [facility], [address] [place], [place] [address]  Ethanol     Status: Abnormal   Collection Time: 11/10/21  2:55 AM  Result Value Ref Range   Alcohol, Ethyl (B) 431 (HH) <10 mg/dL    Comment: CRITICAL RESULT CALLED TO, READ BACK BY AND VERIFIED WITH Cathie Beams RN 0330 11/10/21 HNM (NOTE) Lowest detectable limit for serum alcohol is 10 mg/dL.  For medical purposes only. Performed at [facility], [address] [place], [place] [address]   Salicylate level     Status: Abnormal   Collection Time: 11/10/21  2:55 AM  Result Value Ref Range   Salicylate Lvl <7.0 (L) 7.0 - 30.0 mg/dL    Comment: Performed at [facility] [address] [place], [place] [address]  Acetaminophen level     Status: Abnormal   Collection Time: 11/10/21  2:55 AM  Result Value Ref Range   Acetaminophen (Tylenol), Serum  <10 (L) 10 - 30 ug/mL    Comment: (NOTE) Therapeutic concentrations vary significantly. A range of 10-30 ug/mL  may be an effective concentration for many patients. However, some  are best treated at concentrations outside of this  range. Acetaminophen concentrations >150 ug/mL at 4 hours after ingestion  and >50 ug/mL at 12 hours after ingestion are often associated with  toxic reactions.  Performed at [facility], [address] [place], [place] [address]   Urine Drug Screen, [facility] only)     Status: Abnormal   Collection Time: 11/10/21  2:55 AM  Result Value Ref Range   Tricyclic, Ur Screen NONE DETECTED NONE DETECTED   Amphetamines, Ur Screen NONE DETECTED NONE DETECTED   MDMA (Ecstasy)Ur Screen NONE DETECTED NONE DETECTED   Cocaine Metabolite,Ur [place] NONE DETECTED NONE DETECTED   Opiate, Ur Screen NONE DETECTED NONE DETECTED   Phencyclidine (PCP) Ur S NONE DETECTED NONE DETECTED   Cannabinoid 50 Ng, Ur [place] POSITIVE (A) NONE DETECTED   Barbiturates, Ur Screen NONE DETECTED NONE DETECTED   Benzodiazepine, Ur Scrn NONE DETECTED NONE DETECTED   Methadone Scn, Ur NONE DETECTED NONE DETECTED    Comment: (NOTE) Tricyclics + metabolites, urine    Cutoff 1000 ng/mL Amphetamines + metabolites, urine  Cutoff 1000 ng/mL MDMA (Ecstasy), urine              Cutoff 500 ng/mL Cocaine Metabolite, urine          Cutoff 300 ng/mL Opiate + metabolites, urine        Cutoff 300 ng/mL Phencyclidine (PCP), urine         Cutoff 25 ng/mL Cannabinoid, urine                 Cutoff 50 ng/mL Barbiturates + metabolites, urine  Cutoff 200 ng/mL Benzodiazepine, urine              Cutoff 200 ng/mL Methadone, urine                   Cutoff 300 ng/mL  The urine drug screen provides only a preliminary, unconfirmed analytical test result and should not be used for non-medical purposes. Clinical consideration and professional judgment should be applied to any positive drug screen result due  to possible interfering substances. A more specific alternate chemical method must be used in order to obtain a confirmed analytical result. Gas chromatography / mass spectrometry (GC/MS) is the preferred confirm atory method. Performed at [facility], [address] [place], [place] [address]     No current facility-administered medications for this encounter.   Current Outpatient Medications  Medication Sig Dispense Refill   FLUoxetine (PROZAC) 20 MG capsule Take 1 capsule (20 mg total) by mouth at bedtime. 7 capsule 0   FLUoxetine (PROZAC) 20 MG capsule Take 1 capsule (20 mg total) by mouth at bedtime. 30 capsule 1   hydrOXYzine (ATARAX/VISTARIL) 50 MG tablet Take 1 tablet (50 mg total) by mouth 3 (three) times daily as needed for anxiety. 21 tablet 0   hydrOXYzine (ATARAX/VISTARIL) 50 MG tablet Take 1 tablet (50 mg total) by mouth 3 (three) times daily as needed for anxiety. 60 tablet 1   naltrexone (DEPADE) 50 MG tablet Take 1 tablet (50 mg total) by mouth daily. 7 tablet 0   naltrexone (DEPADE) 50 MG tablet Take 1 tablet (50 mg total) by mouth daily. 30 tablet 1   OLANZapine (ZYPREXA) 5 MG tablet Take 3 tablets (15 mg total) by mouth at bedtime. 21 tablet 0   OLANZapine (ZYPREXA) 5 MG tablet Take 3 tablets (15 mg total) by mouth at bedtime. 90 tablet 1    Musculoskeletal: Strength & Muscle Tone: within normal limits Gait &  Station: normal Patient leans: N/A  Psychiatric Specialty Exam:  Presentation  General Appearance: Disheveled; Bizarre  Eye Contact:Minimal  Speech:Pressured; Garbled  Speech Volume:Increased  Handedness:Right   Mood and Affect  Mood:Anxious; Depressed; Hopeless  Affect:Depressed; Inappropriate; Blunt   Thought Process  Thought Processes:Goal Directed  Descriptions of Associations:Intact  Orientation:Full (Time, Place and Person)  Thought Content:Illogical; Paranoid Ideation; Rumination; Tangential  History of  Schizophrenia/Schizoaffective disorder:No  Duration of Psychotic Symptoms:No data recorded Hallucinations:Hallucinations: None  Ideas of Reference:None  Suicidal Thoughts:Suicidal Thoughts: Yes, Active SI Active Intent and/or Plan: With Intent; Without Plan; With Means to Carry Out; With Access to Means  Homicidal Thoughts:Homicidal Thoughts: No   Sensorium  Memory:Remote Fair; Immediate Fair; Recent Fair  Judgment:Impaired  Insight:Present   Executive Functions  Concentration:Fair  Attention Span:Fair  Recall:Fair  Fund of Knowledge:Fair  Language:Fair   Psychomotor Activity  Psychomotor Activity:Psychomotor Activity: Decreased   Assets  Assets:Desire for Improvement; Intimacy; Physical Health; Social Support   Sleep  Sleep:Sleep: Fair   Physical Exam: Physical Exam ROS Blood pressure 126/90, pulse 88, temperature 98.4 F (36.9 C), temperature source Oral, resp. rate 16, height 5\' 9"  (1.753 m), weight 74.8 kg, SpO2 97 %. Body mass index is 24.37 kg/m.  Treatment Plan Summary: Daily contact with patient to assess and evaluate symptoms and progress in treatment, Medication management, and Plan Admit to inpatient psychiatric unit.   Disposition: Recommend psychiatric Inpatient admission when medically cleared.  , NP 11/10/2021 12:27 PM

## 2021-11-10 NOTE — ED Notes (Signed)
Pt asleep, lunch tray will be offered to pt once he awakens.

## 2021-11-10 NOTE — Consult Note (Signed)
[facility] Face-to-Face Psychiatry Consult   Reason for Consult:Mental Health Problem Referring Physician: Dr. Roxan Hockey Patient Identification: Esequiel Kleinfelter MRN:  [id number] Principal Diagnosis: <principal problem not specified> Diagnosis:  Active Problems:   Bipolar 1 disorder, mixed, severe (HCC)   Alcohol abuse   Suicidal ideation   Tobacco use disorder   Bipolar 1 disorder, depressed (HCC)   Schizoaffective disorder (HCC)   Alcohol use disorder, severe, dependence (HCC)   Cannabis use disorder, moderate, in controlled environment (HCC)   Moderate alcohol use disorder (HCC)   Severe recurrent major depression without psychotic features (HCC)   Alcohol withdrawal (HCC)   Bipolar depression (HCC)   Bipolar 1 disorder (HCC)   Total Time spent with patient: 1 hour  Subjective: "I am a alcoholic."  Anish Vana Fetterman is a 35 y.o. male patient presented to [facility] ED via law enforcement under involuntary commitment status (IVC). The patient's BAL is 431 mg/dl, and UDS is positive for Cannabinoids which the patient admitted to drinking and smoking Marijuana. The patient admitted that during withdrawals, he experiences seizures.  The patient was seen face-to-face by this provider; the chart was reviewed and consulted with Dr. Roxan Hockey on 11/10/2021 due to the patient's care. It was discussed with the EDP that the patient remained under observation overnight and will be reassessed in the a.m. to determine if he meets the criteria for psychiatric inpatient admission; he could be discharged home. On evaluation, the patient is alert and oriented x 2-3, anxious, and agitated but is cooperative and mood-congruent with affect. The patient does not appear to be responding to internal or external stimuli. The patient is presenting with some delusional thinking. The patient denies auditory or visual hallucinations. The patient admits to suicidal and self-harm ideations. The patient denies homicidal  ideations patient is presenting with some psychotic behaviors.    HPI: Per Dr. Roxan Hockey, [facility] Strollo is a 35 y.o. male the below listed past medical history with extensive history of polysubstance abuse and alcohol abuse presents to the ER with suicidal ideation with plan to "blow hole in his head the size of a Cadillac."  Patient appears clinically intoxicated not providing much additional history.  Past Psychiatric History:  Anxiety   Bipolar 1 disorder (HCC)   Chronic headache   Depression  ETOH abuse Hep C w/o coma, chronic (HCC)   Marijuana smoker     Risk to Self:   Risk to Others:   Prior Inpatient Therapy:   Prior Outpatient Therapy:    Past Medical History:  Past Medical History:  Diagnosis Date   Anxiety    Bipolar 1 disorder (HCC)    Chronic headache    Depression 03/30/2020   ETOH abuse 03/30/2020   Hep C w/o coma, chronic (HCC)    Heroin addiction (HCC)    Horseshoe kidney    Marijuana smoker 03/30/2020    Past Surgical History:  Procedure Laterality Date   TONSILLECTOMY     Family History:  Family History  Problem Relation Age of Onset   Hypertension Mother    Diabetes Mother    Hypertension Father    Heart failure Father    Diabetes Father    Family Psychiatric  History:  Social History:  Social History   Substance and Sexual Activity  Alcohol Use Yes   Alcohol/week: 24.0 standard drinks   Types: 24 Cans of beer per week   Comment: 4 gallons a day- pt reports more than this currently     Social  History   Substance and Sexual Activity  Drug Use Not Currently   Types: Marijuana, Cocaine   Comment: Heroin addiction- denies current drug use    Social History   Socioeconomic History   Marital status: Single    Spouse name: Not on file   Number of children: 1   Years of education: 76   Highest education level: 11th grade  Occupational History   Not on file  Tobacco Use   Smoking status: Every Day    Packs/day: 1.00     Types: Cigarettes   Smokeless tobacco: Current    Types: Chew  Vaping Use   Vaping Use: Never used  Substance and Sexual Activity   Alcohol use: Yes    Alcohol/week: 24.0 standard drinks    Types: 24 Cans of beer per week    Comment: 4 gallons a day- pt reports more than this currently   Drug use: Not Currently    Types: Marijuana, Cocaine    Comment: Heroin addiction- denies current drug use   Sexual activity: Not Currently  Other Topics Concern   Not on file  Social History Narrative   Not on file   Social Determinants of Health   Financial Resource Strain: Not on file  Food Insecurity: Not on file  Transportation Needs: Not on file  Physical Activity: Not on file  Stress: Not on file  Social Connections: Not on file   Additional Social History:    Allergies:   Allergies  Allergen Reactions   Geodon [Ziprasidone Hcl] Other (See Comments)    Seizures   Valproic Acid    Levofloxacin Itching    Labs:  Results for orders placed or performed during the hospital encounter of 11/10/21 (from the past 48 hour(s))  CBC     Status: Abnormal   Collection Time: 11/10/21  2:55 AM  Result Value Ref Range   WBC 11.9 (H) 4.0 - 10.5 K/uL   RBC 4.73 4.22 - 5.81 MIL/uL   Hemoglobin 15.7 13.0 - 17.0 g/dL   HCT 62.9 52.8 - 41.3 %   MCV 96.0 80.0 - 100.0 fL   MCH 33.2 26.0 - 34.0 pg   MCHC 34.6 30.0 - 36.0 g/dL   RDW 24.4 01.0 - 27.2 %   Platelets 309 150 - 400 K/uL   nRBC 0.0 0.0 - 0.2 %    Comment: Performed at [facility], [address] [place], [place] [address]  Basic metabolic panel     Status: Abnormal   Collection Time: 11/10/21  2:55 AM  Result Value Ref Range   Sodium 139 135 - 145 mmol/L   Potassium 3.2 (L) 3.5 - 5.1 mmol/L   Chloride 103 98 - 111 mmol/L   CO2 23 22 - 32 mmol/L   Glucose, Bld 169 (H) 70 - 99 mg/dL    Comment: Glucose reference range applies only to samples taken after fasting for at least 8 hours.   BUN 12 6 - 20 mg/dL    Creatinine, Ser 4.03 0.61 - 1.24 mg/dL   Calcium 8.8 (L) 8.9 - 10.3 mg/dL   GFR, Estimated >47 >42 mL/min    Comment: (NOTE) Calculated using the CKD-EPI Creatinine Equation (2021)    Anion gap 13 5 - 15    Comment: Performed at [facility], [address] [place], [place] [address]  Ethanol     Status: Abnormal   Collection Time: 11/10/21  2:55 AM  Result Value Ref Range   Alcohol,  Ethyl (B) 431 (HH) <10 mg/dL    Comment: CRITICAL RESULT CALLED TO, READ BACK BY AND VERIFIED WITH Cathie Beams RN 0330 11/10/21 HNM (NOTE) Lowest detectable limit for serum alcohol is 10 mg/dL.  For medical purposes only. Performed at [facility], [address] [place], [place] [address]   Salicylate level     Status: Abnormal   Collection Time: 11/10/21  2:55 AM  Result Value Ref Range   Salicylate Lvl <7.0 (L) 7.0 - 30.0 mg/dL    Comment: Performed at [facility], [address] [place], [place] [address]  Acetaminophen level     Status: Abnormal   Collection Time: 11/10/21  2:55 AM  Result Value Ref Range   Acetaminophen (Tylenol), Serum <10 (L) 10 - 30 ug/mL    Comment: (NOTE) Therapeutic concentrations vary significantly. A range of 10-30 ug/mL  may be an effective concentration for many patients. However, some  are best treated at concentrations outside of this range. Acetaminophen concentrations >150 ug/mL at 4 hours after ingestion  and >50 ug/mL at 12 hours after ingestion are often associated with  toxic reactions.  Performed at [facility], [address], [place], [place] [address]   Urine Drug Screen, Qual[facility] only)     Status: Abnormal   Collection Time: 11/10/21  2:55 AM  Result Value Ref Range   Tricyclic, Ur Screen NONE DETECTED NONE DETECTED   Amphetamines, Ur Screen NONE DETECTED NONE DETECTED   MDMA (Ecstasy)Ur Screen NONE DETECTED NONE DETECTED   Cocaine Metabolite,Ur [place] NONE DETECTED NONE DETECTED   Opiate, Ur  Screen NONE DETECTED NONE DETECTED   Phencyclidine (PCP) Ur S NONE DETECTED NONE DETECTED   Cannabinoid 50 Ng, Ur [place] POSITIVE (A) NONE DETECTED   Barbiturates, Ur Screen NONE DETECTED NONE DETECTED   Benzodiazepine, Ur Scrn NONE DETECTED NONE DETECTED   Methadone Scn, Ur NONE DETECTED NONE DETECTED    Comment: (NOTE) Tricyclics + metabolites, urine    Cutoff 1000 ng/mL Amphetamines + metabolites, urine  Cutoff 1000 ng/mL MDMA (Ecstasy), urine              Cutoff 500 ng/mL Cocaine Metabolite, urine          Cutoff 300 ng/mL Opiate + metabolites, urine        Cutoff 300 ng/mL Phencyclidine (PCP), urine         Cutoff 25 ng/mL Cannabinoid, urine                 Cutoff 50 ng/mL Barbiturates + metabolites, urine  Cutoff 200 ng/mL Benzodiazepine, urine              Cutoff 200 ng/mL Methadone, urine                   Cutoff 300 ng/mL  The urine drug screen provides only a preliminary, unconfirmed analytical test result and should not be used for non-medical purposes. Clinical consideration and professional judgment should be applied to any positive drug screen result due to possible interfering substances. A more specific alternate chemical method must be used in order to obtain a confirmed analytical result. Gas chromatography / mass spectrometry (GC/MS) is the preferred confirm atory method. Performed at [facility], [address] [place], [place] [address]     No current facility-administered medications for this encounter.   Current Outpatient Medications  Medication Sig Dispense Refill   FLUoxetine (PROZAC) 20 MG capsule Take 1 capsule (20 mg total) by  mouth at bedtime. 7 capsule 0   FLUoxetine (PROZAC) 20 MG capsule Take 1 capsule (20 mg total) by mouth at bedtime. 30 capsule 1   hydrOXYzine (ATARAX/VISTARIL) 50 MG tablet Take 1 tablet (50 mg total) by mouth 3 (three) times daily as needed for anxiety. 21 tablet 0   hydrOXYzine (ATARAX/VISTARIL) 50 MG tablet Take 1  tablet (50 mg total) by mouth 3 (three) times daily as needed for anxiety. 60 tablet 1   naltrexone (DEPADE) 50 MG tablet Take 1 tablet (50 mg total) by mouth daily. 7 tablet 0   naltrexone (DEPADE) 50 MG tablet Take 1 tablet (50 mg total) by mouth daily. 30 tablet 1   OLANZapine (ZYPREXA) 5 MG tablet Take 3 tablets (15 mg total) by mouth at bedtime. 21 tablet 0   OLANZapine (ZYPREXA) 5 MG tablet Take 3 tablets (15 mg total) by mouth at bedtime. 90 tablet 1    Musculoskeletal: Strength & Muscle Tone: within normal limits Gait & Station: normal Patient leans: N/A  Psychiatric Specialty Exam:  Presentation  General Appearance: Disheveled; Bizarre  Eye Contact:Minimal  Speech:Pressured; Garbled  Speech Volume:Increased  Handedness:Right   Mood and Affect  Mood:Anxious; Depressed; Hopeless  Affect:Depressed; Inappropriate; Blunt   Thought Process  Thought Processes:Goal Directed  Descriptions of Associations:Intact  Orientation:Full (Time, Place and Person)  Thought Content:Illogical; Paranoid Ideation; Rumination; Tangential  History of Schizophrenia/Schizoaffective disorder:No  Duration of Psychotic Symptoms:No data recorded Hallucinations:Hallucinations: None  Ideas of Reference:None  Suicidal Thoughts:Suicidal Thoughts: Yes, Active SI Active Intent and/or Plan: With Intent; Without Plan; With Means to Carry Out; With Access to Means  Homicidal Thoughts:Homicidal Thoughts: No   Sensorium  Memory:Remote Fair; Immediate Fair; Recent Fair  Judgment:Impaired  Insight:Present   Executive Functions  Concentration:Fair  Attention Span:Fair  Recall:Fair  Fund of Knowledge:Fair  Language:Fair   Psychomotor Activity  Psychomotor Activity:Psychomotor Activity: Decreased   Assets  Assets:Desire for Improvement; Intimacy; Physical Health; Social Support   Sleep  Sleep:Sleep: Fair   Physical Exam: Physical Exam Vitals and nursing note reviewed.   Constitutional:      Appearance: He is normal weight.  HENT:     Head: Normocephalic and atraumatic.     Nose: Nose normal.     Mouth/Throat:     Mouth: Mucous membranes are moist.  Cardiovascular:     Rate and Rhythm: Tachycardia present.  Pulmonary:     Effort: Pulmonary effort is normal.  Musculoskeletal:        General: Normal range of motion.     Cervical back: Normal range of motion and neck supple.  Neurological:     Mental Status: He is alert and oriented to person, place, and time.  Psychiatric:        Attention and Perception: Attention normal.        Mood and Affect: Mood is depressed. Affect is blunt and inappropriate.        Speech: Speech is delayed and tangential.        Behavior: Behavior is agitated, aggressive and hyperactive.        Thought Content: Thought content is paranoid and delusional. Thought content includes suicidal ideation. Thought content includes suicidal plan.        Cognition and Memory: He exhibits impaired recent memory.        Judgment: Judgment is impulsive and inappropriate.   Review of Systems  Psychiatric/Behavioral:  Positive for depression, substance abuse and suicidal ideas. The patient is nervous/anxious.   All other systems reviewed  and are negative. Blood pressure (!) 157/112, pulse (!) 114, temperature 98.5 F (36.9 C), temperature source Oral, resp. rate 18, height 5\' 9"  (1.753 m), weight 74.8 kg, SpO2 95 %. Body mass index is 24.37 kg/m.  Treatment Plan Summary: Daily contact with patient to assess and evaluate symptoms and progress in treatment and Plan  The patient remained under observation overnight and will be reassessed in the a.m. to determine if he meets the criteria for psychiatric inpatient admission; he could be discharged home.   Disposition: Supportive therapy provided about ongoing stressors. The patient remained under observation overnight and will be reassessed in the a.m. to determine if he meets the criteria  for psychiatric inpatient admission; he could be discharged home.  , NP 11/10/2021 5:38 AM

## 2021-11-10 NOTE — BH Assessment (Signed)
Patient is to be admitted to [facility] BMU tonight 11/10/21 after 7:30pm by Dr. Toni Amend.  Attending Physician will be Dr.  Toni Amend .   Patient has been assigned to room 324, by [facility] Charge Nurse, Aundra Millet.    ER staff is aware of the admission: Misty Stanley, ER Secretary   Dr. Erma Heritage, ER MD  Florentina Addison, Patient's Nurse  Sue Lush, Patient Access.

## 2021-11-10 NOTE — ED Notes (Signed)
With this nurse and EDT L Nelda Severe present, pt removes black boots, tan pants, black short sleeve shirt, black socks, black belt, plaid boxers, burgandy long sleeve shirt--all placed in labeled pt belonging bag to be secured on nursing unit and pt changed into behav scrubs

## 2021-11-10 NOTE — ED Notes (Signed)
Given breakfast. Up in bed eating.

## 2021-11-10 NOTE — ED Notes (Addendum)
Pt asleep, meal tray to be given to pt once he awakens.

## 2021-11-10 NOTE — ED Notes (Signed)
IVC/Consult Completed/Plan to observe overnight possibly D/C in Am

## 2021-11-10 NOTE — ED Provider Notes (Signed)
[facility] Emergency Department Provider Note    Event Date/Time   First MD Initiated Contact with Patient 11/10/21 0300     (approximate)  I have reviewed the triage vital signs and the nursing notes.   HISTORY  Chief Complaint Mental Health Problem  Level v Caveat:  Intoxication  HPI Nathan [name] is a 35 y.o. male the below listed past medical history with extensive history of polysubstance abuse and alcohol abuse presents to the ER with suicidal ideation with plan to "blow hole in his head the size of a Cadillac."  Patient appears clinically intoxicated not providing much additional history.  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   Chronic headache 06/26/2020   Encounter to establish care 06/26/2020   History of gastroesophageal reflux (GERD) 06/26/2020   Cannabis use disorder, moderate, in controlled environment ([place]) 04/10/2020   Cocaine use disorder ([place]) 04/10/2020   Alcohol use disorder, severe, dependence ([place]) 04/09/2020   Stress  reaction, acute 04/09/2020   Non-suicidal self harm 04/05/2020   Bipolar 1 disorder, depressed ([place]) 01/31/2018   Intercostal pain 09/07/2016   Bipolar 1 disorder, mixed, severe ([place]) 04/22/2016   Alcohol abuse 04/22/2016   Suicidal ideation 04/22/2016   Tobacco use disorder 04/22/2016   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 Authorizing Provider  FLUoxetine (PROZAC) 20 MG capsule Take 1 capsule (20 mg total) by mouth at bedtime. 09/03/21   Salley Scarlet, MD  FLUoxetine (PROZAC) 20 MG capsule Take 1 capsule (20 mg total) by mouth at bedtime. 09/08/21   Clapacs, Madie Reno, MD  hydrOXYzine (ATARAX/VISTARIL) 50 MG tablet Take 1 tablet (50 mg total) by mouth 3 (three) times daily as needed for anxiety. 09/03/21   Salley Scarlet, MD  hydrOXYzine (ATARAX/VISTARIL) 50 MG tablet Take 1 tablet (50 mg total) by mouth 3 (three) times daily as needed for anxiety. 09/08/21   Clapacs, Madie Reno, MD  naltrexone (DEPADE) 50 MG tablet Take 1 tablet (50 mg total) by mouth daily. 09/03/21   Salley Scarlet, MD  naltrexone (DEPADE) 50 MG tablet Take 1 tablet (50 mg total) by mouth daily. 09/08/21   Clapacs, Madie Reno, MD  OLANZapine (ZYPREXA) 5 MG tablet Take 3 tablets (15 mg total) by mouth at bedtime. 09/03/21   Salley Scarlet, MD  OLANZapine (ZYPREXA) 5 MG tablet Take 3 tablets (15 mg total) by mouth at bedtime. 09/08/21   Clapacs, Jackquline Denmark, MD    Allergies Geodon [ziprasidone hcl], Valproic acid, and Levofloxacin    Social History Social History   Tobacco Use   Smoking status: Every Day    Packs/day: 1.00    Types: Cigarettes   Smokeless tobacco: Current    Types: Chew  Vaping Use   Vaping Use: Never used  Substance Use Topics   Alcohol use: Yes    Alcohol/week: 24.0 standard drinks    Types: 24 Cans of beer per week    Comment: 4 gallons a day-  pt reports more than this currently   Drug use: Not Currently    Types: Marijuana, Cocaine    Comment: Heroin addiction- denies current drug use    Review of Systems Patient denies headaches, rhinorrhea, blurry vision, numbness, shortness of breath, chest pain, edema, cough, abdominal pain, nausea, vomiting, diarrhea, dysuria, fevers, rashes or hallucinations unless otherwise stated above in HPI. ____________________________________________   PHYSICAL EXAM:  VITAL SIGNS: Vitals:   11/10/21 0250  BP: (!) 157/112  Pulse: (!) 114  Resp: 18  Temp: 98.5 F (36.9 C)  SpO2: 95%    Constitutional: Alert Eyes: Conjunctivae are normal.  Head: Atraumatic. Nose: No congestion/rhinnorhea. Mouth/Throat: Mucous membranes are moist.   Neck: No stridor. Painless ROM.  Cardiovascular: Normal rate, regular rhythm. Grossly normal heart sounds.  Good peripheral circulation. Respiratory: Normal respiratory effort.  No retractions. Lungs CTAB. Gastrointestinal: Soft and nontender. No distention. No abdominal bruits. No CVA tenderness. Genitourinary: deferred Musculoskeletal: No lower extremity tenderness nor edema.  No joint effusions. Neurologic:  slurred speech and language. No gross focal neurologic deficits are appreciated. No facial droop Skin:  Skin is warm, dry and intact. No rash noted. Psychiatric: intoxicated, +SI  ____________________________________________   LABS (all labs ordered are listed, but only abnormal results are displayed)  Results for orders placed or performed during the hospital encounter of 11/10/21 (from the past 24 hour(s))  CBC     Status: Abnormal   Collection Time: 11/10/21  2:55 AM  Result Value Ref Range   WBC 11.9 (H) 4.0 - 10.5 K/uL   RBC 4.73 4.22 - 5.81 MIL/uL   Hemoglobin 15.7 13.0 - 17.0 g/dL   HCT 97.4 16.3 - 84.5 %   MCV 96.0 80.0 - 100.0 fL   MCH 33.2 26.0 - 34.0 pg   MCHC 34.6 30.0 - 36.0 g/dL   RDW 36.4 68.0 - 32.1 %   Platelets 309 150 -  400 K/uL   nRBC 0.0 0.0 - 0.2 %  Basic metabolic panel     Status: Abnormal   Collection Time: 11/10/21  2:55 AM  Result Value Ref Range   Sodium 139 135 - 145 mmol/L   Potassium 3.2 (L) 3.5 - 5.1 mmol/L   Chloride 103 98 - 111 mmol/L   CO2 23 22 - 32 mmol/L   Glucose, Bld 169 (H) 70 - 99 mg/dL   BUN 12 6 - 20 mg/dL   Creatinine, Ser 2.24 0.61 - 1.24 mg/dL   Calcium 8.8 (L) 8.9 - 10.3 mg/dL   GFR, Estimated >82 >50 mL/min   Anion gap 13 5 - 15  Ethanol     Status: Abnormal   Collection Time: 11/10/21  2:55 AM  Result Value Ref Range   Alcohol, Ethyl (B) 431 (HH) <10 mg/dL  Salicylate level     Status: Abnormal  Collection Time: 11/10/21  2:55 AM  Result Value Ref Range   Salicylate Lvl Q000111Q (L) 7.0 - 30.0 mg/dL  Acetaminophen level     Status: Abnormal   Collection Time: 11/10/21  2:55 AM  Result Value Ref Range   Acetaminophen (Tylenol), Serum <10 (L) 10 - 30 ug/mL   ____________________________________________  ____________________________________________  RADIOLOGY  I personally reviewed all radiographic images ordered to evaluate for the above acute complaints and reviewed radiology reports and findings.  These findings were personally discussed with the patient.  Please see medical record for radiology report.  ____________________________________________   PROCEDURES  Procedure(s) performed:  Procedures    Critical Care performed: no ____________________________________________   INITIAL IMPRESSION / ASSESSMENT AND PLAN / ED COURSE  Pertinent labs & imaging results that were available during my care of the patient were reviewed by me and considered in my medical decision making (see chart for details).   DDX: Psychosis, delirium, medication effect, noncompliance, polysubstance abuse, Si, Hi, depression   Nathan [name] is a 34 y.o. who presents to the ED with for evaluation of si and etoh abuse.  Patient has psych history of substance abuse and SI.   Laboratory testing was ordered to evaluation for underlying electrolyte derangement or signs of underlying organic pathology to explain today's presentation.  Based on history and physical and laboratory evaluation, it appears that the patient's presentation is 2/2 underlying psychiatric disorder and will require further evaluation and management by inpatient psychiatry.  Patient was  made an IVC due to SI with plan.  Disposition pending psychiatric evaluation.    The patient has been placed in psychiatric observation due to the need to provide a safe environment for the patient while obtaining psychiatric consultation and evaluation, as well as ongoing medical and medication management to treat the patient's condition.  The patient has been placed under full IVC at this time.   The patient was evaluated in Emergency Department today for the symptoms described in the history of present illness. He/she was evaluated in the context of the global COVID-19 pandemic, which necessitated consideration that the patient might be at risk for infection with the SARS-CoV-2 virus that causes COVID-19. Institutional protocols and algorithms that pertain to the evaluation of patients at risk for COVID-19 are in a state of rapid change based on information released by regulatory bodies including the CDC and federal and state organizations. These policies and algorithms were followed during the patient's care in the ED.  As part of my medical decision making, I reviewed the following data within the [place] notes reviewed and incorporated, Labs reviewed, notes from prior ED visits and  Controlled Substance Database   ____________________________________________   FINAL CLINICAL IMPRESSION(S) / ED DIAGNOSES  Final diagnoses:  Suicidal ideation  Alcohol abuse      NEW MEDICATIONS STARTED DURING THIS VISIT:  New Prescriptions   No medications on file     Note:  This document  was prepared using Dragon voice recognition software and may include unintentional dictation errors.    Merlyn Lot, MD 11/10/21 [phone]

## 2021-11-10 NOTE — ED Triage Notes (Signed)
Patient ambulatory to triage with steady gait, without difficulty or distress noted, accomp by [employer]; pt reports thoughts of hurting self

## 2021-11-10 NOTE — Tx Team (Signed)
Initial Treatment Plan 11/10/2021 8:39 PM [facility] Dewaine Conger MBT:[id number]    PATIENT STRESSORS: Medication change or noncompliance   Substance abuse     PATIENT STRENGTHS: Capable of independent living  Motivation for treatment/growth    PATIENT IDENTIFIED PROBLEMS: Alcohol/Substance Abuse  Suicidal ideation                   DISCHARGE CRITERIA:  Improved stabilization in mood, thinking, and/or behavior  PRELIMINARY DISCHARGE PLAN: Outpatient therapy  PATIENT/FAMILY INVOLVEMENT: This treatment plan has been presented to and reviewed with the patient, Nathan [name], and/or family member, .  The patient and family have been given the opportunity to ask questions and make suggestions.  Shelia Media, RN 11/10/2021, 8:39 PM

## 2021-11-10 NOTE — ED Notes (Signed)
Declines lunch tray.

## 2021-11-11 DIAGNOSIS — F332 Major depressive disorder, recurrent severe without psychotic features: Secondary | ICD-10-CM | POA: Diagnosis not present

## 2021-11-11 DIAGNOSIS — K0889 Other specified disorders of teeth and supporting structures: Secondary | ICD-10-CM

## 2021-11-11 MED ORDER — IBUPROFEN 600 MG PO TABS
600.0000 mg | ORAL_TABLET | Freq: Four times a day (QID) | ORAL | Status: DC | PRN
  Administered 2021-11-11 – 2021-11-12 (×4): 600 mg via ORAL
  Filled 2021-11-11 (×4): qty 1

## 2021-11-11 MED ORDER — BENZOCAINE 10 % MT GEL
Freq: Four times a day (QID) | OROMUCOSAL | Status: DC | PRN
  Filled 2021-11-11 (×2): qty 9

## 2021-11-11 MED ORDER — NICOTINE POLACRILEX 2 MG MT GUM
2.0000 mg | CHEWING_GUM | OROMUCOSAL | Status: DC | PRN
  Administered 2021-11-11 – 2021-11-13 (×7): 2 mg via ORAL
  Filled 2021-11-11 (×7): qty 1

## 2021-11-11 MED ORDER — PROMETHAZINE HCL 25 MG PO TABS
25.0000 mg | ORAL_TABLET | Freq: Four times a day (QID) | ORAL | Status: DC | PRN
  Administered 2021-11-11 – 2021-11-12 (×3): 25 mg via ORAL
  Filled 2021-11-11 (×3): qty 1

## 2021-11-11 MED ORDER — OLANZAPINE 5 MG PO TABS
5.0000 mg | ORAL_TABLET | Freq: Every day | ORAL | Status: DC
  Administered 2021-11-11 – 2021-11-12 (×2): 5 mg via ORAL
  Filled 2021-11-11 (×2): qty 1

## 2021-11-11 MED ORDER — FLUOXETINE HCL 20 MG PO CAPS
20.0000 mg | ORAL_CAPSULE | Freq: Every day | ORAL | Status: DC
  Administered 2021-11-11 – 2021-11-13 (×3): 20 mg via ORAL
  Filled 2021-11-11 (×3): qty 1

## 2021-11-11 NOTE — Group Note (Signed)
[facility] LCSW Group Therapy Note   Group Date: 11/11/2021 Start Time: 1310 End Time: 1400   Type of Therapy/Topic:  Group Therapy:  Emotion Regulation  Participation Level:  None   Mood:  Description of Group:    The purpose of this group is to assist patients in learning to regulate negative emotions and experience positive emotions. Patients will be guided to discuss ways in which they have been vulnerable to their negative emotions. These vulnerabilities will be juxtaposed with experiences of positive emotions or situations, and patients challenged to use positive emotions to combat negative ones. Special emphasis will be placed on coping with negative emotions in conflict situations, and patients will process healthy conflict resolution skills.  Therapeutic Goals: Patient will identify two positive emotions or experiences to reflect on in order to balance out negative emotions:  Patient will label two or more emotions that they find the most difficult to experience:  Patient will be able to demonstrate positive conflict resolution skills through discussion or role plays:   Summary of Patient Progress: Patient was present in the group but left prematurely. He did not participate in the discussion.  Therapeutic Modalities:   Cognitive Behavioral Therapy Feelings Identification Dialectical Behavioral Therapy   Glenis Smoker, LCSW

## 2021-11-11 NOTE — [facility] Counselor (Signed)
CSW attempted to meet with pt to complete PSA. However, pt was sleeping and did not move when CSW came into the room. Pt appeared to be sleeping soundly and in no acute distress. CSW will attempt update to PSA at a later time.   Vilma Meckel. Algis Greenhouse, MSW, LCSW, LCAS 11/11/2021 2:24 PM

## 2021-11-11 NOTE — [facility] Suicide Risk Assessment (Signed)
[facility] Admission Suicide Risk Assessment   Nursing information obtained from:  Patient Demographic factors:  Caucasian, Low socioeconomic status, Unemployed, Male Current Mental Status:  Suicidal ideation indicated by patient Loss Factors:  Financial problems / change in socioeconomic status Historical Factors:  Family history of mental illness or substance abuse, Impulsivity Risk Reduction Factors:  Positive coping skills or problem solving skills  Total Time spent with patient: 1 hour Principal Problem: Severe recurrent major depression without psychotic features (HCC) Diagnosis:  Principal Problem:   Severe recurrent major depression without psychotic features (HCC) Active Problems:   Substance or medication-induced anxiety disorder (HCC)   Alcohol abuse  Subjective Data: Patient well known to [place] with a history of alcohol abuse personality disorder recurrent depression.  Endorses suicidal thoughts but has no intention or plan of acting on it currently in the hospital.  Not currently endorsing psychosis.  Cooperative with treatment in the hospital.  Continued Clinical Symptoms:  Alcohol Use Disorder Identification Test Final Score (AUDIT): 37 The "Alcohol Use Disorders Identification Test", Guidelines for Use in Primary Care, Second Edition.  World Science writer [facility]). Score between 0-7:  no or low risk or alcohol related problems. Score between 8-15:  moderate risk of alcohol related problems. Score between 16-19:  high risk of alcohol related problems. Score 20 or above:  warrants further diagnostic evaluation for alcohol dependence and treatment.   CLINICAL FACTORS:   Depression:   Comorbid alcohol abuse/dependence Alcohol/Substance Abuse/Dependencies   Musculoskeletal: Strength & Muscle Tone: within normal limits Gait & Station: normal Patient leans: N/A  Psychiatric Specialty Exam:  Presentation  General Appearance: Disheveled; Bizarre  Eye  Contact:Minimal  Speech:Pressured; Garbled  Speech Volume:Increased  Handedness:Right   Mood and Affect  Mood:Anxious; Depressed; Hopeless  Affect:Depressed; Inappropriate; Blunt   Thought Process  Thought Processes:Goal Directed  Descriptions of Associations:Intact  Orientation:Full (Time, Place and Person)  Thought Content:Illogical; Paranoid Ideation; Rumination; Tangential  History of Schizophrenia/Schizoaffective disorder:No  Duration of Psychotic Symptoms:No data recorded Hallucinations:Hallucinations: None  Ideas of Reference:None  Suicidal Thoughts:Suicidal Thoughts: Yes, Active SI Active Intent and/or Plan: With Intent; Without Plan; With Means to Carry Out; With Access to Means  Homicidal Thoughts:Homicidal Thoughts: No   Sensorium  Memory:Remote Fair; Immediate Fair; Recent Fair  Judgment:Impaired  Insight:Present   Executive Functions  Concentration:Fair  Attention Span:Fair  Recall:Fair  Fund of Knowledge:Fair  Language:Fair   Psychomotor Activity  Psychomotor Activity:Psychomotor Activity: Decreased   Assets  Assets:Desire for Improvement; Intimacy; Physical Health; Social Support   Sleep  Sleep:Sleep: Fair    Physical Exam: Physical Exam Vitals and nursing note reviewed.  Constitutional:      Appearance: Normal appearance.  HENT:     Head: Normocephalic and atraumatic.     Mouth/Throat:     Pharynx: Oropharynx is clear.  Eyes:     Pupils: Pupils are equal, round, and reactive to light.  Cardiovascular:     Rate and Rhythm: Normal rate and regular rhythm.  Pulmonary:     Effort: Pulmonary effort is normal.     Breath sounds: Normal breath sounds.  Abdominal:     General: Abdomen is flat.     Palpations: Abdomen is soft.  Musculoskeletal:        General: Normal range of motion.  Skin:    General: Skin is warm and dry.  Neurological:     General: No focal deficit present.     Mental Status: He is alert. Mental  status is at baseline.  Psychiatric:  Attention and Perception: Attention normal.        Mood and Affect: Mood is depressed.        Speech: Speech normal.        Behavior: Behavior normal.        Thought Content: Thought content normal.        Cognition and Memory: Cognition normal. Memory is impaired.        Judgment: Judgment is inappropriate.   Review of Systems  Constitutional: Negative.   HENT: Negative.    Eyes: Negative.   Respiratory: Negative.    Cardiovascular: Negative.   Gastrointestinal: Negative.   Musculoskeletal: Negative.   Skin: Negative.   Neurological: Negative.   Psychiatric/Behavioral:  Positive for depression, substance abuse and suicidal ideas.   Blood pressure (!) 135/101, pulse (!) 109, temperature 98.3 F (36.8 C), temperature source Oral, resp. rate 18, height 5\' 9"  (1.753 m), weight 59.9 kg, SpO2 100 %. Body mass index is 19.49 kg/m.   COGNITIVE FEATURES THAT CONTRIBUTE TO RISK:  Loss of executive function    SUICIDE RISK:   Minimal: No identifiable suicidal ideation.  Patients presenting with no risk factors but with morbid ruminations; may be classified as minimal risk based on the severity of the depressive symptoms  PLAN OF CARE: Continue 15-minute checks.  Detox protocol.  Restart medicine for depression.  Ongoing assessment of dangerousness before discharge planning  I certify that inpatient services furnished can reasonably be expected to improve the patient's condition.   Alethia Berthold, MD 11/11/2021, 1:36 PM

## 2021-11-11 NOTE — Progress Notes (Signed)
Recreation Therapy Notes  INPATIENT RECREATION THERAPY ASSESSMENT  Patient Details Name: Nathan [name] MRN: [id number] DOB: 10-21-1986 Today's Date: 11/11/2021       Information Obtained From: Patient  Able to Participate in Assessment/Interview: Yes  Patient Presentation: Responsive  Reason for Admission (Per Patient): Active Symptoms, Other (Comments) (Depression)  Patient Stressors: Other (Comment) (Depression)  Coping Skills:   Substance Abuse, Music, Exercise  Leisure Interests (2+):  Exercise - Walking, Social - Family, Technical brewer - Ambulance person of Recreation/Participation: Monthly  Awareness of Community Resources:  No  Community Resources:     Current Use:    If no, Barriers?:    Expressed Interest in [employer] Information: Yes  [employer] of Residence:  Film/video editor  Patient Main Form of Transportation: Walk  Patient Strengths:  Chief Executive Officer, Smart  Patient Identified Areas of Improvement:  Quit drinking  Patient Goal for Hospitalization:  Get steps and resources to get my ID.  Current SI (including self-harm):  No  Current HI:  No  Current AVH: No  Staff Intervention Plan: Group Attendance  Consent to Intern Participation: N/A  Naya Ilagan 11/11/2021, 12:31 PM

## 2021-11-11 NOTE — Progress Notes (Signed)
Recreation Therapy Notes  INPATIENT RECREATION TR PLAN  Patient Details Name: Nathan [name] MRN: [id number] DOB: 1986/03/02 Today's Date: 11/11/2021  Rec Therapy Plan Is patient appropriate for Therapeutic Recreation?: Yes Treatment times per week: At least 3 Estimated Length of Stay: 5-7 days TR Treatment/Interventions: Group participation (Comment)  Discharge Criteria Pt will be discharged from therapy if:: Discharged Treatment plan/goals/alternatives discussed and agreed upon by:: Patient/family  Discharge Summary     Amada Hallisey 11/11/2021, 12:32 PM

## 2021-11-11 NOTE — H&P (Signed)
Psychiatric Admission Assessment Adult  Patient Identification: Nathan [name] MRN:  [id number] Date of Evaluation:  11/11/2021 Chief Complaint:  Alcohol abuse [F10.10] Principal Diagnosis: Severe recurrent major depression without psychotic features (HCC) Diagnosis:  Principal Problem:   Severe recurrent major depression without psychotic features (HCC) Active Problems:   Substance or medication-induced anxiety disorder (HCC)   Alcohol abuse   Loosening of tooth  History of Present Illness: Patient seen and chart reviewed.  Patient well known from previous encounters.  This is a 35 year old man with a history of alcohol abuse personality disorder recurrent depression.  Came to the emergency room very intoxicated blood alcohol level over 400 claiming that he was depressed and seriously thinking about killing himself.  Today the patient has sobered up and was fairly cooperative with exam.  He tells me that since his last admission in September he spent some time in jail and just got out about a week ago.  He admits that he started drinking literally immediately as soon as he got out of jail and has been drinking heavily since.  He does not really seem to make a clear connection at times between that and his mood.  Instead he tells me that his mood just suddenly became so depressed and hopeless he felt suicidal despite the fact that he thinks that everything is going wonderfully in his life.  The more you talk to him though the more dissatisfied he clearly is with pretty much every aspect of his life like his relationship with his family and his children.  Patient presents with a somewhat histrionic affect which is characteristic of him.  No evidence psychosis.  Does not seem to have any actual hopelessness or wish to harm himself.  He is preoccupied with a tooth in his lower jaw that is loose that he wants pain relief 4.  Also wanted to talk with me about his desire to be prescribed Adderall.   Since his last time here and every other time since pretty much he has failed to follow up with outpatient treatment and failed to stay on his medicine.  He says this time he took the medicine "as long as it lasted" which probably just means for a week.  He will say things like it definitely did not work even though it is clear that his mood was better when he was taking it than it is now. Associated Signs/Symptoms: Depression Symptoms:  depressed mood, insomnia, psychomotor agitation, feelings of worthlessness/guilt, difficulty concentrating, hopelessness, suicidal thoughts without plan, Duration of Depression Symptoms: Greater than two weeks  (Hypo) Manic Symptoms:  Distractibility, Impulsivity, Irritable Mood, Labiality of Mood, Anxiety Symptoms:  Excessive Worry, Psychotic Symptoms:  Paranoia, PTSD Symptoms: Negative Total Time spent with patient: 1 hour  Past Psychiatric History: Long history of presentations all pretty much the same.  Alcohol abuse.  Despite always saying when he comes in that he recognizes that he needs to stop drinking he does not seem to ever put the effort into actually staying with a detox or a rehab program or making follow-up appointments.  Instead has a lot of excuses about it.  He has had some rough detox in the past but I do not think he has had full DTs.  Occasionally uses other drugs but mostly it is alcohol some marijuana.  History of frequent threats of suicide but nothing other than some cutting in the past as far as actual self injury.  Sometimes drinks some inappropriate alcohol like rubbing alcohol when  he is intoxicated.  Diagnosis has ranged from bipolar and schizoaffective to depression.  I do not think he really has schizoaffective disorder and it is very hard to assess the aspect of him being bipolar when he cannot stay sober.  Is the patient at risk to self? Yes.    Has the patient been a risk to self in the past 6 months? Yes.    Has the  patient been a risk to self within the distant past? Yes.    Is the patient a risk to others? No.  Has the patient been a risk to others in the past 6 months? Yes.    Has the patient been a risk to others within the distant past? Yes.     Prior Inpatient Therapy:   Prior Outpatient Therapy:    Alcohol Screening: Patient refused Alcohol Screening Tool: Yes 1. How often do you have a drink containing alcohol?: 4 or more times a week 2. How many drinks containing alcohol do you have on a typical day when you are drinking?: 10 or more 3. How often do you have six or more drinks on one occasion?: Daily or almost daily AUDIT-C Score: 12 4. How often during the last year have you found that you were not able to stop drinking once you had started?: Daily or almost daily 5. How often during the last year have you failed to do what was normally expected from you because of drinking?: Daily or almost daily 6. How often during the last year have you needed a first drink in the morning to get yourself going after a heavy drinking session?: Daily or almost daily 7. How often during the last year have you had a feeling of guilt of remorse after drinking?: Weekly 8. How often during the last year have you been unable to remember what happened the night before because you had been drinking?: Daily or almost daily 9. Have you or someone else been injured as a result of your drinking?: Yes, but not in the last year 10. Has a relative or friend or a doctor or another health worker been concerned about your drinking or suggested you cut down?: Yes, during the last year Alcohol Use Disorder Identification Test Final Score (AUDIT): 37 Alcohol Brief Interventions/Follow-up: Alcohol education/Brief advice Substance Abuse History in the last 12 months:  Yes.   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[place])    Horseshoe kidney    Marijuana smoker 03/30/2020    Past Surgical History:  Procedure Laterality Date   TONSILLECTOMY     Family History:  Family History  Problem Relation Age of Onset   Hypertension Mother    Diabetes Mother    Hypertension Father    Heart failure Father    Diabetes Father    Family Psychiatric  History: Substance abuse Tobacco Screening:   Social History:  Social History   Substance and Sexual Activity  Alcohol Use Yes   Alcohol/week: 24.0 standard drinks   Types: 24 Cans of beer per week  Comment: 4 gallons a day- pt reports more than this currently     Social History   Substance and Sexual Activity  Drug Use Yes   Types: Marijuana, Cocaine, Benzodiazepines   Comment: Heroin addiction- denies current drug use    Additional Social History:                           Allergies:   Allergies  Allergen Reactions   Geodon [Ziprasidone Hcl] Other (See Comments)    Seizures   Valproic Acid    Levofloxacin Itching   Lab Results:  Results for orders placed or performed during the hospital encounter of 11/10/21 (from the past 48 hour(s))  CBC     Status: Abnormal   Collection Time: 11/10/21  2:55 AM  Result Value Ref Range   WBC 11.9 (H) 4.0 - 10.5 K/uL   RBC 4.73 4.22 - 5.81 MIL/uL   Hemoglobin 15.7 13.0 - 17.0 g/dL   HCT 45.4 39.0 - 52.0 %   MCV 96.0 80.0 - 100.0 fL   MCH 33.2 26.0 - 34.0 pg   MCHC 34.6 30.0 - 36.0 g/dL   RDW 13.9 11.5 - 15.5 %   Platelets 309 150 - 400 K/uL   nRBC 0.0 0.0 - 0.2 %    Comment: Performed at [facility], [address], [place],  [place] [id number]  Basic metabolic panel     Status: Abnormal   Collection Time: 11/10/21  2:55 AM  Result Value Ref Range   Sodium 139 135 - 145 mmol/L   Potassium 3.2 (L) 3.5 - 5.1 mmol/L   Chloride 103 98 - 111 mmol/L   CO2 23 22 - 32 mmol/L   Glucose, Bld 169 (H) 70 - 99 mg/dL    Comment: Glucose reference range applies only to samples taken after fasting for at least 8 hours.   BUN 12 6 - 20 mg/dL   Creatinine, Ser 0.71 0.61 - 1.24 mg/dL   Calcium 8.8 (L) 8.9 - 10.3 mg/dL   GFR, Estimated >60 >60 mL/min    Comment: (NOTE) Calculated using the CKD-EPI Creatinine Equation (2021)    Anion gap 13 5 - 15    Comment: Performed at [facility], [address] [place], [place] [address]  Ethanol     Status: Abnormal   Collection Time: 11/10/21  2:55 AM  Result Value Ref Range   Alcohol, Ethyl (B) 431 (HH) <10 mg/dL    Comment: CRITICAL RESULT CALLED TO, READ BACK BY AND VERIFIED WITH Rex Kras RN 0330 11/10/21 HNM (NOTE) Lowest detectable limit for serum alcohol is 10 mg/dL.  For medical purposes only. Performed at [facility], [address] [place], [place] [id number]   Salicylate level     Status: Abnormal   Collection Time: 11/10/21  2:55 AM  Result Value Ref Range   Salicylate Lvl Q000111Q (L) 7.0 - 30.0 mg/dL    Comment: Performed at [facility], [address] [place],  [address]  Acetaminophen level     Status: Abnormal   Collection Time: 11/10/21  2:55 AM  Result Value Ref Range   Acetaminophen (Tylenol), Serum <10 (L) 10 - 30 ug/mL    Comment: (NOTE) Therapeutic concentrations vary significantly. A range of 10-30 ug/mL  may be an effective concentration for many patients. However, some  are best treated at concentrations outside of this range. Acetaminophen concentrations >150 ug/mL at 4 hours after  ingestion  and >50 ug/mL at 12 hours after ingestion are often associated with  toxic reactions.  Performed at [facility], [address], [place], [place] [address]   Urine Drug Screen, [facility] only)     Status: Abnormal   Collection Time: 11/10/21  2:55 AM  Result Value Ref Range   Tricyclic, Ur Screen NONE DETECTED NONE DETECTED   Amphetamines, Ur Screen NONE DETECTED NONE DETECTED   MDMA (Ecstasy)Ur Screen NONE DETECTED NONE DETECTED   Cocaine Metabolite,Ur [place] NONE DETECTED NONE DETECTED   Opiate, Ur Screen NONE DETECTED NONE DETECTED   Phencyclidine (PCP) Ur S NONE DETECTED NONE DETECTED   Cannabinoid 50 Ng, Ur [place] POSITIVE (A) NONE DETECTED   Barbiturates, Ur Screen NONE DETECTED NONE DETECTED   Benzodiazepine, Ur Scrn NONE DETECTED NONE DETECTED   Methadone Scn, Ur NONE DETECTED NONE DETECTED    Comment: (NOTE) Tricyclics + metabolites, urine    Cutoff 1000 ng/mL Amphetamines + metabolites, urine  Cutoff 1000 ng/mL MDMA (Ecstasy), urine              Cutoff 500 ng/mL Cocaine Metabolite, urine          Cutoff 300 ng/mL Opiate + metabolites, urine        Cutoff 300 ng/mL Phencyclidine (PCP), urine         Cutoff 25 ng/mL Cannabinoid, urine                 Cutoff 50 ng/mL Barbiturates + metabolites, urine  Cutoff 200 ng/mL Benzodiazepine, urine              Cutoff 200 ng/mL Methadone, urine                   Cutoff 300 ng/mL  The urine drug screen provides only a preliminary, unconfirmed analytical test result and should not be used for non-medical purposes. Clinical consideration and professional judgment should be applied to any positive drug screen result due to possible interfering substances. A more specific alternate chemical method must be used in order to obtain a confirmed analytical result. Gas chromatography / mass spectrometry (GC/MS) is the preferred confirm atory method. Performed at [facility], [address] [place], [place] [address]   Resp Panel by RT-PCR (Flu A&B, Covid) Nasopharyngeal Swab     Status: None   Collection Time: 11/10/21  1:40 PM    Specimen: Nasopharyngeal Swab; Nasopharyngeal(NP) swabs in vial transport medium  Result Value Ref Range   SARS Coronavirus 2 by RT PCR NEGATIVE NEGATIVE    Comment: (NOTE) SARS-CoV-2 target nucleic acids are NOT DETECTED.  The SARS-CoV-2 RNA is generally detectable in upper respiratory specimens during the acute phase of infection. The lowest concentration of SARS-CoV-2 viral copies this assay can detect is 138 copies/mL. A negative result does not preclude SARS-Cov-2 infection and should not be used as the sole basis for treatment or other patient management decisions. A negative result may occur with  improper specimen collection/handling, submission of specimen other than nasopharyngeal swab, presence of viral mutation(s) within the areas targeted by this assay, and inadequate number of viral copies(<138 copies/mL). A negative result must be combined with clinical observations, patient history, and epidemiological information. The expected result is Negative.  Fact Sheet for Patients:  EntrepreneurPulse.com.au  Fact Sheet for Healthcare Providers:  IncredibleEmployment.be  This test is no t yet approved or cleared by the [place] FDA and  has been authorized for detection and/or diagnosis of  SARS-CoV-2 by FDA under an Emergency Use Authorization (EUA). This EUA will remain  in effect (meaning this test can be used) for the duration of the COVID-19 declaration under Section 564(b)(1) of the Act, 21 U.S.C.section 360bbb-3(b)(1), unless the authorization is terminated  or revoked sooner.       Influenza A by PCR NEGATIVE NEGATIVE   Influenza B by PCR NEGATIVE NEGATIVE    Comment: (NOTE) The Xpert Xpress SARS-CoV-2/FLU/RSV plus assay is intended as an aid in the diagnosis of influenza from Nasopharyngeal swab specimens and should not be used as a sole basis for treatment. Nasal washings and aspirates are unacceptable for Xpert Xpress  SARS-CoV-2/FLU/RSV testing.  Fact Sheet for Patients: EntrepreneurPulse.com.au  Fact Sheet for Healthcare Providers: IncredibleEmployment.be  This test is not yet approved or cleared by the [place] FDA and has been authorized for detection and/or diagnosis of SARS-CoV-2 by FDA under an Emergency Use Authorization (EUA). This EUA will remain in effect (meaning this test can be used) for the duration of the COVID-19 declaration under Section 564(b)(1) of the Act, 21 U.S.C. section 360bbb-3(b)(1), unless the authorization is terminated or revoked.  Performed at [facility], [address] [place], [place] [address]     Blood Alcohol level:  Lab Results  Component Value Date   ETH [address] [facility]) 11/10/2021   ETH <10 [id number]    Metabolic Disorder Labs:  Lab Results  Component Value Date   HGBA1C 5.2 02/06/2021   MPG 103 02/06/2021   Lab Results  Component Value Date   PROLACTIN 7.0 04/23/2016   Lab Results  Component Value Date   CHOL 139 02/06/2021   TRIG 55 02/06/2021   HDL 55 02/06/2021   CHOLHDL 2.5 02/06/2021   VLDL 11 02/06/2021   LDLCALC 73 02/06/2021   LDLCALC 105 (H) 04/23/2016    Current Medications: Current Facility-Administered Medications  Medication Dose Route Frequency Provider Last Rate Last Admin   acetaminophen (TYLENOL) tablet 650 mg  650 mg Oral Q6H PRN Sherlon Handing, NP   650 mg at 11/11/21 0810   alum & mag hydroxide-simeth (MAALOX/MYLANTA) 200-200-20 MG/5ML suspension 30 mL  30 mL Oral Q4H PRN Sherlon Handing, NP       benzocaine (ORAJEL) 10 % mucosal gel   Mouth/Throat QID PRN Candler Ginsberg, Madie Reno, MD       FLUoxetine (PROZAC) capsule 20 mg  20 mg Oral Daily Torin Modica T, MD       ibuprofen (ADVIL) tablet 600 mg  600 mg Oral Q6H PRN Jasiri Hanawalt T, MD   600 mg at 11/11/21 1220   [START ON 11/12/2021] LORazepam (ATIVAN) injection 0-4 mg  0-4 mg Intravenous Q12H Benita Gutter, RPH        Or   [START ON 11/12/2021] LORazepam (ATIVAN) tablet 0-4 mg  0-4 mg Oral Q12H Benita Gutter, RPH       LORazepam (ATIVAN) injection 0-4 mg  0-4 mg Intravenous Q6H Lockie Mola B, RPH   2 mg at 11/10/21 2116   Or   LORazepam (ATIVAN) tablet 0-4 mg  0-4 mg Oral Q6H Benita Gutter, RPH   2 mg at 11/11/21 0809   magnesium hydroxide (MILK OF MAGNESIA) suspension 30 mL  30 mL Oral Daily PRN Waldon Merl F, NP       nicotine polacrilex (NICORETTE) gum 2 mg  2 mg Oral PRN Ahmira Boisselle, Madie Reno, MD   2 mg at 11/11/21 1047   OLANZapine (ZYPREXA) tablet 5  mg  5 mg Oral QHS Rhya Shan, Madie Reno, MD       promethazine (PHENERGAN) tablet 25 mg  25 mg Oral Q6H PRN Riddhi Grether T, MD   25 mg at 11/11/21 1306   thiamine tablet 100 mg  100 mg Oral Daily Waldon Merl F, NP   100 mg at 11/11/21 [id number]   Or   thiamine (B-1) injection 100 mg  100 mg Intravenous Daily Waldon Merl F, NP       PTA Medications: Medications Prior to Admission  Medication Sig Dispense Refill Last Dose   FLUoxetine (PROZAC) 20 MG capsule Take 1 capsule (20 mg total) by mouth at bedtime. 7 capsule 0    FLUoxetine (PROZAC) 20 MG capsule Take 1 capsule (20 mg total) by mouth at bedtime. 30 capsule 1    hydrOXYzine (ATARAX/VISTARIL) 50 MG tablet Take 1 tablet (50 mg total) by mouth 3 (three) times daily as needed for anxiety. 21 tablet 0    hydrOXYzine (ATARAX/VISTARIL) 50 MG tablet Take 1 tablet (50 mg total) by mouth 3 (three) times daily as needed for anxiety. 60 tablet 1    naltrexone (DEPADE) 50 MG tablet Take 1 tablet (50 mg total) by mouth daily. 7 tablet 0    naltrexone (DEPADE) 50 MG tablet Take 1 tablet (50 mg total) by mouth daily. 30 tablet 1    OLANZapine (ZYPREXA) 5 MG tablet Take 3 tablets (15 mg total) by mouth at bedtime. 21 tablet 0    OLANZapine (ZYPREXA) 5 MG tablet Take 3 tablets (15 mg total) by mouth at bedtime. 90 tablet 1     Musculoskeletal: Strength & Muscle Tone: within normal limits Gait &  Station: normal Patient leans: N/A            Psychiatric Specialty Exam:  Presentation  General Appearance: Disheveled; Bizarre  Eye Contact:Minimal  Speech:Pressured; Garbled  Speech Volume:Increased  Handedness:Right   Mood and Affect  Mood:Anxious; Depressed; Hopeless  Affect:Depressed; Inappropriate; Blunt   Thought Process  Thought Processes:Goal Directed  Duration of Psychotic Symptoms: No data recorded Past Diagnosis of Schizophrenia or Psychoactive disorder: No  Descriptions of Associations:Intact  Orientation:Full (Time, Place and Person)  Thought Content:Illogical; Paranoid Ideation; Rumination; Tangential  Hallucinations:Hallucinations: None  Ideas of Reference:None  Suicidal Thoughts:Suicidal Thoughts: Yes, Active SI Active Intent and/or Plan: With Intent; Without Plan; With Means to [place]; With Access to Means  Homicidal Thoughts:Homicidal Thoughts: No   Sensorium  Memory:Remote Fair; Immediate Fair; Recent Fair  Judgment:Impaired  Insight:Present   Executive Functions  Concentration:Fair  Attention Span:Fair  [place]   Psychomotor Activity  Psychomotor Activity:Psychomotor Activity: Decreased   Assets  Assets:Desire for Improvement; Intimacy; Physical Health; Social Support   Sleep  Sleep:Sleep: Fair    Physical Exam: Physical Exam Vitals and nursing note reviewed.  Constitutional:      Appearance: Normal appearance.  HENT:     Head: Normocephalic and atraumatic.     Mouth/Throat:     Pharynx: Oropharynx is clear.  Eyes:     Pupils: Pupils are equal, round, and reactive to light.  Cardiovascular:     Rate and Rhythm: Normal rate and regular rhythm.  Pulmonary:     Effort: Pulmonary effort is normal.     Breath sounds: Normal breath sounds.  Abdominal:     General: Abdomen is flat.     Palpations: Abdomen is soft.  Musculoskeletal:        General:  Normal range  of motion.  Skin:    General: Skin is warm and dry.  Neurological:     General: No focal deficit present.     Mental Status: He is alert. Mental status is at baseline.  Psychiatric:        Attention and Perception: Attention normal.        Mood and Affect: Mood is anxious and depressed.        Speech: Speech is tangential.        Behavior: Behavior is cooperative.        Thought Content: Thought content normal.        Cognition and Memory: Memory is impaired.        Judgment: Judgment is inappropriate.   Review of Systems  Constitutional: Negative.   HENT: Negative.         He has a loose tooth on the bottom jaw right in the front which is giving him some pain  Eyes: Negative.   Respiratory: Negative.    Cardiovascular: Negative.   Gastrointestinal: Negative.   Musculoskeletal: Negative.   Skin: Negative.   Neurological: Negative.   Psychiatric/Behavioral:  Positive for depression, memory loss, substance abuse and suicidal ideas. Negative for hallucinations. The patient is nervous/anxious and has insomnia.   Blood pressure (!) 135/101, pulse (!) 109, temperature 98.3 F (36.8 C), temperature source Oral, resp. rate 18, height 5\' 9"  (1.753 m), weight 59.9 kg, SpO2 100 %. Body mass index is 19.49 kg/m.  Treatment Pla: He does have detox orders in place although right now he is looking pretty stable physically.  I agreed to some Motrin and Orajel for the loose tooth and some Phenergan as needed for nausea.  Restart Prozac and Risperdal which he was taking when he was here last time.  Tried to gently get him to think about how he needs to seriously follow up with future treatment.  He was trying to convince me that Adderall was the most helpful medicine for him.  This is actually possible but I think no one is likely to take the risk of prescribing him Adderall with his history of irresponsible behavior and substance abuse and I told him that if he wanted to get back on  Adderall for real he needed to demonstrate that he was serious about being in treatment.  Continue 15-minute checks.  We can work on a discharge plan over the next couple days once he is detoxed.  Observation Level/Precautions:  15 minute checks  Laboratory:  UDS  Psychotherapy:    Medications:    Consultations:    Discharge Concerns:    Estimated LOS:  Other:     Physician Treatment Plan for Primary Diagnosis: Severe recurrent major depression without psychotic features ([place]) Long Term Goal(s): Improvement in symptoms so as ready for discharge  Short Term Goals: Ability to disclose and discuss suicidal ideas and Ability to demonstrate self-control will improve  Physician Treatment Plan for Secondary Diagnosis: Principal Problem:   Severe recurrent major depression without psychotic features ([place]) Active Problems:   Substance or medication-induced anxiety disorder (HCC)   Alcohol abuse   Loosening of tooth  Long Term Goal(s): Improvement in symptoms so as ready for discharge  Short Term Goals: Ability to identify and develop effective coping behaviors will improve, Ability to maintain clinical measurements within normal limits will improve, and Compliance with prescribed medications will improve  I certify that inpatient services furnished can reasonably be expected to improve the patient's condition.    Jenny Reichmann  Brayln Duque, MD 11/23/20221:42 PM

## 2021-11-11 NOTE — Progress Notes (Signed)
Recreation Therapy Notes  Date: 11/11/2021  Time: 10:30 am    Location: Craft room    Behavioral response: Appropriate  Intervention Topic: Time Management     Discussion/Intervention:  Group content today was focused on time management. The group defined time management and identified healthy ways to manage time. Individuals expressed how much of the 24 hours they use in a day. Patients expressed how much time they use just for themselves personally. The group expressed how they have managed their time in the past. Individuals participated in the intervention "Managing Life" where they had a chance to see how much of the 24 hours they use and where it goes. Clinical Observations/Feedback: Patient came to group late due to unknown reasons. Individual was social with staff and peers while participating in the intervention. Carliss Porcaro LRT/CTRS         Agata Lucente 11/11/2021 12:27 PM

## 2021-11-11 NOTE — Progress Notes (Signed)
Patient labile, noted on unit shortly after admission threatening other peer., yelling. Reports peer told him he would "beat his ass>' Writer assured vet that peer was not speaking to him directly. Pt redirected. Pt given ativan due to withdrawal symptoms as well as tylenol for pain. Pt rested well later during shift. Admitted for substance abuse and Suicidal ideation. Continues to report thoughts  with plan to shoot self in head. Patient contracts for safety on unit. Skin and contraband check completed and witnessed by Tiffany, MHT. See flow sheet for skin issue, no contraband found. Patient offered fluid and nutrition. Encouragement and support provided. Safety checks maintained. Medication given as prescribed. Pt receptive and remains safe on unit with q 15 min checks.

## 2021-11-11 NOTE — BH IP Treatment Plan (Signed)
Interdisciplinary Treatment and Diagnostic Plan Update  11/11/2021 Time of Session: 9:30AM Nathan [name] MRN: [id number]  Principal Diagnosis: Alcohol abuse  Secondary Diagnoses: Principal Problem:   Alcohol abuse   Current Medications:  Current Facility-Administered Medications  Medication Dose Route Frequency Provider Last Rate Last Admin   acetaminophen (TYLENOL) tablet 650 mg  650 mg Oral Q6H PRN Sherlon Handing, NP   650 mg at 11/11/21 0810   alum & mag hydroxide-simeth (MAALOX/MYLANTA) 200-200-20 MG/5ML suspension 30 mL  30 mL Oral Q4H PRN Sherlon Handing, NP       [START ON 11/12/2021] LORazepam (ATIVAN) injection 0-4 mg  0-4 mg Intravenous Q12H Benita Gutter, RPH       Or   [START ON 11/12/2021] LORazepam (ATIVAN) tablet 0-4 mg  0-4 mg Oral Q12H Benita Gutter, RPH       LORazepam (ATIVAN) injection 0-4 mg  0-4 mg Intravenous Q6H Benita Gutter, RPH   2 mg at 11/10/21 2116   Or   LORazepam (ATIVAN) tablet 0-4 mg  0-4 mg Oral Q6H Benita Gutter, RPH   2 mg at 11/11/21 4818   magnesium hydroxide (MILK OF MAGNESIA) suspension 30 mL  30 mL Oral Daily PRN Sherlon Handing, NP       nicotine polacrilex (NICORETTE) gum 2 mg  2 mg Oral PRN Clapacs, Madie Reno, MD       thiamine tablet 100 mg  100 mg Oral Daily Waldon Merl F, NP   100 mg at 11/11/21 5631   Or   thiamine (B-1) injection 100 mg  100 mg Intravenous Daily Waldon Merl F, NP       PTA Medications: Medications Prior to Admission  Medication Sig Dispense Refill Last Dose   FLUoxetine (PROZAC) 20 MG capsule Take 1 capsule (20 mg total) by mouth at bedtime. 7 capsule 0    FLUoxetine (PROZAC) 20 MG capsule Take 1 capsule (20 mg total) by mouth at bedtime. 30 capsule 1    hydrOXYzine (ATARAX/VISTARIL) 50 MG tablet Take 1 tablet (50 mg total) by mouth 3 (three) times daily as needed for anxiety. 21 tablet 0    hydrOXYzine (ATARAX/VISTARIL) 50 MG tablet Take 1 tablet (50 mg total) by mouth 3 (three)  times daily as needed for anxiety. 60 tablet 1    naltrexone (DEPADE) 50 MG tablet Take 1 tablet (50 mg total) by mouth daily. 7 tablet 0    naltrexone (DEPADE) 50 MG tablet Take 1 tablet (50 mg total) by mouth daily. 30 tablet 1    OLANZapine (ZYPREXA) 5 MG tablet Take 3 tablets (15 mg total) by mouth at bedtime. 21 tablet 0    OLANZapine (ZYPREXA) 5 MG tablet Take 3 tablets (15 mg total) by mouth at bedtime. 90 tablet 1     Patient Stressors: Medication change or noncompliance   Substance abuse    Patient Strengths: Capable of independent living  Motivation for treatment/growth   Treatment Modalities: Medication Management, Group therapy, Case management,  1 to 1 session with clinician, Psychoeducation, Recreational therapy.   Physician Treatment Plan for Primary Diagnosis: Alcohol abuse Long Term Goal(s):     Short Term Goals:    Medication Management: Evaluate patient's response, side effects, and tolerance of medication regimen.  Therapeutic Interventions: 1 to 1 sessions, Unit Group sessions and Medication administration.  Evaluation of Outcomes: Not Met  Physician Treatment Plan for Secondary Diagnosis: Principal Problem:   Alcohol abuse  Long Term Goal(s):  Short Term Goals:       Medication Management: Evaluate patient's response, side effects, and tolerance of medication regimen.  Therapeutic Interventions: 1 to 1 sessions, Unit Group sessions and Medication administration.  Evaluation of Outcomes: Not Met   RN Treatment Plan for Primary Diagnosis: Alcohol abuse Long Term Goal(s): Knowledge of disease and therapeutic regimen to maintain health will improve  Short Term Goals: Ability to verbalize frustration and anger appropriately will improve, Ability to demonstrate self-control, Ability to participate in decision making will improve, Ability to verbalize feelings will improve, Ability to disclose and discuss suicidal ideas, Ability to identify and develop  effective coping behaviors will improve, and Compliance with prescribed medications will improve  Medication Management: RN will administer medications as ordered by provider, will assess and evaluate patient's response and provide education to patient for prescribed medication. RN will report any adverse and/or side effects to prescribing provider.  Therapeutic Interventions: 1 on 1 counseling sessions, Psychoeducation, Medication administration, Evaluate responses to treatment, Monitor vital signs and CBGs as ordered, Perform/monitor CIWA, COWS, AIMS and Fall Risk screenings as ordered, Perform wound care treatments as ordered.  Evaluation of Outcomes: Not Met   LCSW Treatment Plan for Primary Diagnosis: Alcohol abuse Long Term Goal(s): Safe transition to appropriate next level of care at discharge, Engage patient in therapeutic group addressing interpersonal concerns.  Short Term Goals: Engage patient in aftercare planning with referrals and resources, Increase social support, Increase ability to appropriately verbalize feelings, Increase emotional regulation, Facilitate acceptance of mental health diagnosis and concerns, Facilitate patient progression through stages of change regarding substance use diagnoses and concerns, Identify triggers associated with mental health/substance abuse issues, and Increase skills for wellness and recovery  Therapeutic Interventions: Assess for all discharge needs, 1 to 1 time with Social worker, Explore available resources and support systems, Assess for adequacy in community support network, Educate family and significant other(s) on suicide prevention, Complete Psychosocial Assessment, Interpersonal group therapy.  Evaluation of Outcomes: Not Met   Progress in Treatment: Attending groups: No. Participating in groups: No. Taking medication as prescribed: Yes. Toleration medication: Yes. Family/Significant other contact made: No, will contact:  once  permission is given. Patient understands diagnosis: Yes. Discussing patient identified problems/goals with staff: Yes. Medical problems stabilized or resolved: Yes. Denies suicidal/homicidal ideation: Yes. Issues/concerns per patient self-inventory: No. Other: none  New problem(s) identified: No, Describe:  none  New Short Term/Long Term Goal(s):  detox, elimination of symptoms of psychosis, medication management for mood stabilization; elimination of SI thoughts; development of comprehensive mental wellness/sobriety plan.   Patient Goals:  "I don't want to be depressed."  Discharge Plan or Barriers:   CSW will assist patient in developing appropriate discharge plans.    Reason for Continuation of Hospitalization: Anxiety Depression Medication stabilization Suicidal ideation Withdrawal symptoms  Estimated Length of Stay:  1-7 days   Scribe for Treatment Team: Rozann Lesches, Marlinda Mike 11/11/2021 10:37 AM

## 2021-11-11 NOTE — Progress Notes (Signed)
Pt has been alert and oriented to person, place, time and situation. Pt is calm, cooperative, irritable at times, and pleasant at other times. Pt is medication compliant, CIWA has been assessed, (see results in charting in flowsheets), and Ativan has been given for scores on CIWA which was effective. Pt denies SI/HI/AVH. Pt reports anxiety and depression, reports tooth pain and is given PRN medication for pain, also reports nausea and is given PRN for that. Pt reports he is hoping to get some help with his recovery after discharge because he is tired of the medical effects of his drug and alcohol use and loss of jobs. Pt reports last job he had that he liked he lost due to drinking after the job was over but his employer thought he was drinking on the job. Pt reports he knows a recovery will be very difficult to achieve. Pt noted to be resting in bed often, napping, comes out for meals. In the evening pt was out in the dayroom watching tv and talking on the telephone. Will continue to monitor pt per Q15 minute face checks and monitor for safety and progress.

## 2021-11-12 NOTE — Progress Notes (Signed)
[facility] MD Progress Note  11/12/2021 11:13 AM Nathan [name]  MRN:  [id number] Subjective: Nathan [name] was seen and examined today.  His blood pressure remains a little high.  He has a history of alcohol abuse.  He asked me about ADHD medications.  I told him to discuss that with his doctor.  He has been taking his medications as prescribed and denies any side effects.  He denies any suicidal ideation.  Principal Problem: Severe recurrent major depression without psychotic features (HCC) Diagnosis: Principal Problem:   Severe recurrent major depression without psychotic features (HCC) Active Problems:   Substance or medication-induced anxiety disorder (HCC)   Alcohol abuse   Loosening of tooth  Total Time spent with patient: 15 minutes  Past Psychiatric History: Unremarkable  Past Medical History:  Past Medical History:  Diagnosis Date   Anxiety    Bipolar 1 disorder (HCC)    Chronic headache    Depression 03/30/2020   ETOH abuse 03/30/2020   Hep C w/o coma, chronic (HCC)    Heroin addiction (HCC)    Horseshoe kidney    Marijuana smoker 03/30/2020    Past Surgical History:  Procedure Laterality Date   TONSILLECTOMY     Family History:  Family History  Problem Relation Age of Onset   Hypertension Mother    Diabetes Mother    Hypertension Father    Heart failure Father    Diabetes Father    Family Psychiatric  History: Unremarkable Social History:  Social History   Substance and Sexual Activity  Alcohol Use Yes   Alcohol/week: 24.0 standard drinks   Types: 24 Cans of beer per week   Comment: 4 gallons a day- pt reports more than this currently     Social History   Substance and Sexual Activity  Drug Use Yes   Types: Marijuana, Cocaine, Benzodiazepines   Comment: Heroin addiction- denies current drug use    Social History   Socioeconomic History   Marital status: Single    Spouse name: Not on file   Number of children: 1   Years of education: 11   Highest  education level: 11th grade  Occupational History   Not on file  Tobacco Use   Smoking status: Every Day    Packs/day: 1.00    Types: Cigarettes   Smokeless tobacco: Current    Types: Chew  Vaping Use   Vaping Use: Never used  Substance and Sexual Activity   Alcohol use: Yes    Alcohol/week: 24.0 standard drinks    Types: 24 Cans of beer per week    Comment: 4 gallons a day- pt reports more than this currently   Drug use: Yes    Types: Marijuana, Cocaine, Benzodiazepines    Comment: Heroin addiction- denies current drug use   Sexual activity: Not Currently  Other Topics Concern   Not on file  Social History Narrative   Not on file   Social Determinants of Health   Financial Resource Strain: Not on file  Food Insecurity: Not on file  Transportation Needs: Not on file  Physical Activity: Not on file  Stress: Not on file  Social Connections: Not on file   Additional Social History:                         Sleep: Fair  Appetite:  Fair  Current Medications: Current Facility-Administered Medications  Medication Dose Route Frequency Provider Last Rate Last Admin   acetaminophen (TYLENOL)  tablet 650 mg  650 mg Oral Q6H PRN Vanetta Mulders, NP   650 mg at 11/12/21 1962   alum & mag hydroxide-simeth (MAALOX/MYLANTA) 200-200-20 MG/5ML suspension 30 mL  30 mL Oral Q4H PRN Vanetta Mulders, NP       benzocaine (ORAJEL) 10 % mucosal gel   Mouth/Throat QID PRN Clapacs, Jackquline Denmark, MD       FLUoxetine (PROZAC) capsule 20 mg  20 mg Oral Daily Clapacs, John T, MD   20 mg at 11/12/21 0820   ibuprofen (ADVIL) tablet 600 mg  600 mg Oral Q6H PRN Clapacs, John T, MD   600 mg at 11/11/21 2112   LORazepam (ATIVAN) injection 0-4 mg  0-4 mg Intravenous Q12H Tressie Ellis, RPH       Or   LORazepam (ATIVAN) tablet 0-4 mg  0-4 mg Oral Q12H Tressie Ellis, RPH       LORazepam (ATIVAN) injection 0-4 mg  0-4 mg Intravenous Q6H Tressie Ellis, RPH   2 mg at 11/10/21 2116   Or    LORazepam (ATIVAN) tablet 0-4 mg  0-4 mg Oral Q6H Tressie Ellis, RPH   2 mg at 11/12/21 2297   magnesium hydroxide (MILK OF MAGNESIA) suspension 30 mL  30 mL Oral Daily PRN Gabriel Cirri F, NP       nicotine polacrilex (NICORETTE) gum 2 mg  2 mg Oral PRN Clapacs, Jackquline Denmark, MD   2 mg at 11/12/21 0644   OLANZapine (ZYPREXA) tablet 5 mg  5 mg Oral QHS Clapacs, John T, MD   5 mg at 11/11/21 2114   promethazine (PHENERGAN) tablet 25 mg  25 mg Oral Q6H PRN Clapacs, John T, MD   25 mg at 11/11/21 2113   thiamine tablet 100 mg  100 mg Oral Daily Gabriel Cirri F, NP   100 mg at 11/12/21 9892   Or   thiamine (B-1) injection 100 mg  100 mg Intravenous Daily Gabriel Cirri F, NP        Lab Results:  Results for orders placed or performed during the hospital encounter of 11/10/21 (from the past 48 hour(s))  Resp Panel by RT-PCR (Flu A&B, Covid) Nasopharyngeal Swab     Status: None   Collection Time: 11/10/21  1:40 PM   Specimen: Nasopharyngeal Swab; Nasopharyngeal(NP) swabs in vial transport medium  Result Value Ref Range   SARS Coronavirus 2 by RT PCR NEGATIVE NEGATIVE    Comment: (NOTE) SARS-CoV-2 target nucleic acids are NOT DETECTED.  The SARS-CoV-2 RNA is generally detectable in upper respiratory specimens during the acute phase of infection. The lowest concentration of SARS-CoV-2 viral copies this assay can detect is 138 copies/mL. A negative result does not preclude SARS-Cov-2 infection and should not be used as the sole basis for treatment or other patient management decisions. A negative result may occur with  improper specimen collection/handling, submission of specimen other than nasopharyngeal swab, presence of viral mutation(s) within the areas targeted by this assay, and inadequate number of viral copies(<138 copies/mL). A negative result must be combined with clinical observations, patient history, and epidemiological information. The expected result is Negative.  Fact  Sheet for Patients:  BloggerCourse.com  Fact Sheet for Healthcare Providers:  SeriousBroker.it  This test is no t yet approved or cleared by the [place] FDA and  has been authorized for detection and/or diagnosis of SARS-CoV-2 by FDA under an Emergency Use Authorization (EUA). This EUA will remain  in effect (meaning this  test can be used) for the duration of the COVID-19 declaration under Section 564(b)(1) of the Act, 21 U.S.C.section 360bbb-3(b)(1), unless the authorization is terminated  or revoked sooner.       Influenza A by PCR NEGATIVE NEGATIVE   Influenza B by PCR NEGATIVE NEGATIVE    Comment: (NOTE) The Xpert Xpress SARS-CoV-2/FLU/RSV plus assay is intended as an aid in the diagnosis of influenza from Nasopharyngeal swab specimens and should not be used as a sole basis for treatment. Nasal washings and aspirates are unacceptable for Xpert Xpress SARS-CoV-2/FLU/RSV testing.  Fact Sheet for Patients: BloggerCourse.com  Fact Sheet for Healthcare Providers: SeriousBroker.it  This test is not yet approved or cleared by the [place] FDA and has been authorized for detection and/or diagnosis of SARS-CoV-2 by FDA under an Emergency Use Authorization (EUA). This EUA will remain in effect (meaning this test can be used) for the duration of the COVID-19 declaration under Section 564(b)(1) of the Act, 21 U.S.C. section 360bbb-3(b)(1), unless the authorization is terminated or revoked.  Performed at [facility], [address] [place], [place] [address]     Blood Alcohol level:  Lab Results  Component Value Date   ETH [facility]) 11/10/2021   ETH <10 08/27/2021    Metabolic Disorder Labs: Lab Results  Component Value Date   HGBA1C 5.2 02/06/2021   MPG 103 02/06/2021   Lab Results  Component Value Date   PROLACTIN 7.0 04/23/2016   Lab Results   Component Value Date   CHOL 139 02/06/2021   TRIG 55 02/06/2021   HDL 55 02/06/2021   CHOLHDL 2.5 02/06/2021   VLDL 11 02/06/2021   LDLCALC 73 02/06/2021   LDLCALC 105 (H) 04/23/2016    Physical Findings: AIMS:  , ,  ,  ,    CIWA:  CIWA-Ar Total: 11 COWS:     Musculoskeletal: Strength & Muscle Tone: within normal limits Gait & Station: normal Patient leans: N/A  Psychiatric Specialty Exam:  Presentation  General Appearance: Disheveled; Bizarre  Eye Contact:Minimal  Speech:Pressured; Garbled  Speech Volume:Increased  Handedness:Right   Mood and Affect  Mood:Anxious; Depressed; Hopeless  Affect:Depressed; Inappropriate; Blunt   Thought Process  Thought Processes:Goal Directed  Descriptions of Associations:Intact  Orientation:Full (Time, Place and Person)  Thought Content:Illogical; Paranoid Ideation; Rumination; Tangential  History of Schizophrenia/Schizoaffective disorder:No  Duration of Psychotic Symptoms:No data recorded Hallucinations:No data recorded Ideas of Reference:None  Suicidal Thoughts:No data recorded Homicidal Thoughts:No data recorded  Sensorium  Memory:Remote Fair; Immediate Fair; Recent Fair  Judgment:Impaired  Insight:Present   Executive Functions  Concentration:Fair  Attention Span:Fair  Recall:Fair  Fund of Knowledge:Fair  Language:Fair   Psychomotor Activity  Psychomotor Activity:No data recorded  Assets  Assets:Desire for Improvement; Intimacy; Physical Health; Social Support   Sleep  Sleep:No data recorded   Physical Exam: Physical Exam Vitals and nursing note reviewed.  Constitutional:      Appearance: Normal appearance. He is normal weight.  Neurological:     General: No focal deficit present.     Mental Status: He is alert and oriented to person, place, and time.  Psychiatric:        Attention and Perception: Attention and perception normal.        Mood and Affect: Mood is anxious and  depressed.        Speech: Speech normal.        Behavior: Behavior normal. Behavior is cooperative.        Thought Content: Thought content normal.  Cognition and Memory: Cognition normal.        Judgment: Judgment is impulsive.   Review of Systems  Constitutional: Negative.   Neurological: Negative.   Psychiatric/Behavioral:  Positive for depression. The patient is nervous/anxious.   All other systems reviewed and are negative. Blood pressure (!) 132/101, pulse 73, temperature (!) 97.5 F (36.4 C), temperature source Oral, resp. rate 18, height 5\' 9"  (1.753 m), weight 59.9 kg, SpO2 98 %. Body mass index is 19.49 kg/m.   Treatment Plan Summary: Daily contact with patient to assess and evaluate symptoms and progress in treatment, Medication management, and Plan continue current medications.  Nathan [name] , DO 11/12/2021, 11:13 AM

## 2021-11-12 NOTE — Progress Notes (Signed)
Patient is pleasant and cooperative. He is active on the unit and has been walking and pacing the hall with another patient on the unit. He continues to present anxious but is less irritable.  He denies si/hi/avh, but continues to endorse anxiety and depression.  He also has pulled tooth in lower front. Reports feeling better now that the tooth has finally come out. He is med compliant with his QHS meds and received PRN medication for the tooth ache.  Will continue to monitor with Q15 minute safety checks.   Cleo Butler-Nicholson, LPN

## 2021-11-12 NOTE — B[facility] Counselor (Signed)
CSW attempted to meet with pt to complete PSA. Patient was pacing in hallway and stated he wanted to talk later. CSW will attempt to complete comprehensive assessment at more opportune time.   Donatello Kleve [place], MSW, LCSW-A 11/24/20222:08 PM

## 2021-11-12 NOTE — Plan of Care (Signed)
  Problem: Education: Goal: Knowledge of [facility] Education information/materials will improve Outcome: Progressing Goal: Emotional status will improve Outcome: Progressing Goal: Mental status will improve Outcome: Progressing Goal: Verbalization of understanding the information provided will improve Outcome: Progressing   Problem: Activity: Goal: Interest or engagement in activities will improve Outcome: Progressing Goal: Sleeping patterns will improve Outcome: Progressing   Problem: Coping: Goal: Ability to verbalize frustrations and anger appropriately will improve Outcome: Progressing Goal: Ability to demonstrate self-control will improve Outcome: Progressing   

## 2021-11-12 NOTE — Progress Notes (Signed)
Patient appears as anxious and is observed pacing the hallways. Pt last CIWA was 8 and he received 1 mg of Ativan PO. Pt denies SI, HI, and AVH at this time. After breakfast pt went to sleep and missed speaking to the doctor. Pt was upset when he found out the doctor did not wake him up because he wanted to discuss his medications. Pt stated he wants to be discharged. Will continue to monitor with q 15 minute safety checks.

## 2021-11-13 DIAGNOSIS — F332 Major depressive disorder, recurrent severe without psychotic features: Secondary | ICD-10-CM | POA: Diagnosis not present

## 2021-11-13 NOTE — BHH Suicide Risk Assessment (Signed)
[facility] Discharge Suicide Risk Assessment   Principal Problem: Severe recurrent major depression without psychotic features [facility]) Discharge Diagnoses: Principal Problem:   Severe recurrent major depression without psychotic features (HCC) Active Problems:   Substance or medication-induced anxiety disorder (HCC)   Alcohol abuse   Loosening of tooth   Total Time spent with patient: 45 minutes  Musculoskeletal: Strength & Muscle Tone: within normal limits Gait & Station: normal Patient leans: N/A  Psychiatric Specialty Exam  Presentation  General Appearance: Disheveled; Bizarre  Eye Contact:Minimal  Speech:Pressured; Garbled  Speech Volume:Increased  Handedness:Right   Mood and Affect  Mood:Anxious; Depressed; Hopeless  Duration of Depression Symptoms: Greater than two weeks  Affect:Depressed; Inappropriate; Blunt   Thought Process  Thought Processes:Goal Directed  Descriptions of Associations:Intact  Orientation:Full (Time, Place and Person)  Thought Content:Illogical; Paranoid Ideation; Rumination; Tangential  History of Schizophrenia/Schizoaffective disorder:No  Duration of Psychotic Symptoms:No data recorded Hallucinations:No data recorded Ideas of Reference:None  Suicidal Thoughts:No data recorded Homicidal Thoughts:No data recorded  Sensorium  Memory:Remote Fair; Immediate Fair; Recent Fair  Judgment:Impaired  Insight:Present   Executive Functions  Concentration:Fair  Attention Span:Fair  Recall:Fair  Fund of Knowledge:Fair  Language:Fair   Psychomotor Activity  Psychomotor Activity:No data recorded  Assets  Assets:Desire for Improvement; Intimacy; Physical Health; Social Support   Sleep  Sleep:No data recorded  Physical Exam: Physical Exam Vitals and nursing note reviewed.  Constitutional:      Appearance: Normal appearance.  HENT:     Head: Normocephalic and atraumatic.     Mouth/Throat:     Pharynx: Oropharynx is  clear.  Eyes:     Pupils: Pupils are equal, round, and reactive to light.  Cardiovascular:     Rate and Rhythm: Normal rate and regular rhythm.  Pulmonary:     Effort: Pulmonary effort is normal.     Breath sounds: Normal breath sounds.  Abdominal:     General: Abdomen is flat.     Palpations: Abdomen is soft.  Musculoskeletal:        General: Normal range of motion.  Skin:    General: Skin is warm and dry.  Neurological:     General: No focal deficit present.     Mental Status: He is alert. Mental status is at baseline.  Psychiatric:        Mood and Affect: Mood normal.        Thought Content: Thought content normal.   Review of Systems  Constitutional: Negative.   HENT: Negative.    Eyes: Negative.   Respiratory: Negative.    Cardiovascular: Negative.   Gastrointestinal: Negative.   Musculoskeletal: Negative.   Skin: Negative.   Neurological: Negative.   Psychiatric/Behavioral: Negative.    Blood pressure 129/87, pulse 98, temperature 98.1 F (36.7 C), temperature source Oral, resp. rate 20, height 5\' 9"  (1.753 m), weight 59.9 kg, SpO2 97 %. Body mass index is 19.49 kg/m.  Mental Status Per Nursing Assessment::   On Admission:  Suicidal ideation indicated by patient  Demographic Factors:  Male, Caucasian, and Low socioeconomic status  Loss Factors: Loss of significant relationship  Historical Factors: Impulsivity  Risk Reduction Factors:   Sense of responsibility to family and Positive coping skills or problem solving skills  Continued Clinical Symptoms:  Depression:   Comorbid alcohol abuse/dependence Alcohol/Substance Abuse/Dependencies  Cognitive Features That Contribute To Risk:  None    Suicide Risk:  Minimal: No identifiable suicidal ideation.  Patients presenting with no risk factors but with morbid ruminations; may be classified  as minimal risk based on the severity of the depressive symptoms    Plan Of Care/Follow-up recommendations:  The  patient has not shown any violent dangerous or self-injury behaviors in the hospital.  He now absolutely denies suicidal ideation and has been denying it for about a day.  Patient is insisting that he feels fine now that he is detoxed and wants to go home.  No interested in rehab.  Does not want to even continue antidepressant medication.  Patient currently does not appear to be at elevated risk of self-injury although with intoxication could change.  Patient has been counseled about this and acknowledges the risk.  Mordecai Rasmussen, MD 11/13/2021, 10:48 AM

## 2021-11-13 NOTE — BHH Counselor (Signed)
CSW met with pt briefly to discuss discharge. Pt stated that he needed transportation home but declined outpatient treatment. He stated that he did not need any paperwork or anything but requested a jacket. CSW informed him that it would take a bit for CSW to check on availability of jacket and pt declined need for jacket. No other concerns expressed. Contact ended without incident.   Chalmers Guest. Guerry Bruin, MSW, LCSW, [place] 11/13/2021 11:14 AM

## 2021-11-13 NOTE — Progress Notes (Signed)
  [facility] Adult Case Management Discharge Plan :  Will you be returning to the same living situation after discharge:  Yes,  pt plans to return home. At discharge, do you have transportation home?: Yes,  CSW to arrange safe transport. Do you have the ability to pay for your medications: No.  Release of information consent forms completed and in the chart;  Patient's signature needed at discharge.  Patient to Follow up at:  Follow-up Information     [facility] Follow up.   Why: They have walk-in hours Monday, Wednesday, and Friday 8am to 4pm. Thanks! Contact information: [address] [place] [address] [phone]         [facility] Follow up.   Why: Here's another resource if you need it. They have walk-in hour Monday through Friday, 9am to 4pm. Contact information: [address] [place] [address] [phone]                 Next level of care provider has access to [facility] Link:no  Safety Planning and Suicide Prevention discussed: Yes,  SPE completed with pt.     Has patient been referred to the Quitline?: Patient refused referral  Patient has been referred for addiction treatment: Pt. refused referral  Glenis Smoker, LCSW 11/13/2021, 10:58 AM

## 2021-11-13 NOTE — Progress Notes (Signed)
Pt visible on the unit, interacting with staff and peers. Pt he is here due to his substance and  abuse. He denies SI/HI, and A/V hallucinations. Pt knows he needs substance treatment and plans to have follow up  care at [facility] in [place]. No alcohol withdrawal symptoms and vital sign within normal limits.

## 2021-11-13 NOTE — BHH Counselor (Signed)
Adult Comprehensive Assessment  Patient ID: Nathan [name], male   DOB: 01-11-1986, 35 y.o.   MRN: [id number]  Information Source: Information source: Patient  Current Stressors:  Patient states their primary concerns and needs for treatment are:: Patient admitted for alcohol intoxication and suicidal ideation. Patient states their goals for this hospitilization and ongoing recovery are:: "I don't want to be depressed"  Living/Environment/Situation:  Living Arrangements: Alone  Family History:     Childhood History:     Education:     Employment/Work Situation:      Surveyor, quantity Resources:      Alcohol/Substance Abuse:      Social Support System:      Leisure/Recreation:      Strengths/Needs:      Discharge Plan:      Summary/Recommendations:   Summary and Recommendations (to be completed by the evaluator): Patient is a 35 year old single male from [place], [place] [facility]  He presents to the hospital with alcohol intoxication and suicidal ideation with a plan.  Patient reported admission that he planned to "blow a hole in his head he size of a Cadillac".  At time of admission patient reports that he is not connected with any outpatient mental health providers and is not taking any psychiatric medications.  He indicates that his current stressors at admission are "self loathing" and "preoccupation with not wanting to live".  Patient reports plans to follow up with outpatient provider at discharge, he declines referrals for residential treatment.  Recommendations include: crisis stabilization, therapeutic milieu, encourage group attendance and participation, medication management for mood stabilization and development of comprehensive mental wellness/sobriety plan.  Harden Mo. 11/13/2021

## 2021-11-13 NOTE — Progress Notes (Signed)
Pt for discharge home today. Discharge education done with the patient. He is going home to house in [place]. Pt is upset because transport from the hospital is not responding quickly enough, and the buses are not  in operation today. Pt spoke with the doctor and stated he wanted to be discharged now. He told the doctor he would walk home. The social worker is working on this situation.

## 2021-11-13 NOTE — Progress Notes (Signed)
Pt discharged home, he is catching the link bus home. Escorted ambulatory, out of the Hospital by RN.

## 2021-11-13 NOTE — Discharge Instructions (Signed)
Pt stated he is getting medications changed by [facility]. I told him here he was on prozac 2O mg in the am and Zyprexa 5 mg at bedtime.

## 2021-11-13 NOTE — Progress Notes (Signed)
Patient was already in bed, when write took over his care, no distress noted he denies SI/HI/AVH, will continue to monitor.

## 2021-11-13 NOTE — Plan of Care (Signed)
See progress note Problem: Education: Goal: Knowledge of [facility] Education information/materials will improve Outcome: Adequate for Discharge Goal: Emotional status will improve Outcome: Adequate for Discharge Goal: Mental status will improve Outcome: Adequate for Discharge Goal: Verbalization of understanding the information provided will improve Outcome: Adequate for Discharge   Problem: Activity: Goal: Interest or engagement in activities will improve Outcome: Adequate for Discharge Goal: Sleeping patterns will improve Outcome: Adequate for Discharge   Problem: Coping: Goal: Ability to verbalize frustrations and anger appropriately will improve Outcome: Adequate for Discharge Goal: Ability to demonstrate self-control will improve Outcome: Adequate for Discharge   Problem: Health Behavior/Discharge Planning: Goal: Identification of resources available to assist in meeting health care needs will improve Outcome: Adequate for Discharge Goal: Compliance with treatment plan for underlying cause of condition will improve Outcome: Adequate for Discharge   Problem: Physical Regulation: Goal: Ability to maintain clinical measurements within normal limits will improve Outcome: Adequate for Discharge   Problem: Safety: Goal: Periods of time without injury will increase Outcome: Adequate for Discharge   Problem: Education: Goal: Ability to make informed decisions regarding treatment will improve Outcome: Adequate for Discharge   Problem: Coping: Goal: Coping ability will improve Outcome: Adequate for Discharge   Problem: Health Behavior/Discharge Planning: Goal: Identification of resources available to assist in meeting health care needs will improve Outcome: Adequate for Discharge   Problem: Medication: Goal: Compliance with prescribed medication regimen will improve Outcome: Adequate for Discharge   Problem: Self-Concept: Goal: Ability to disclose and  discuss suicidal ideas will improve Outcome: Adequate for Discharge Goal: Will verbalize positive feelings about self Outcome: Adequate for Discharge   Problem: Education: Goal: Knowledge of disease or condition will improve Outcome: Adequate for Discharge Goal: Understanding of discharge needs will improve Outcome: Adequate for Discharge   Problem: Health Behavior/Discharge Planning: Goal: Ability to identify changes in lifestyle to reduce recurrence of condition will improve Outcome: Adequate for Discharge Goal: Identification of resources available to assist in meeting health care needs will improve Outcome: Adequate for Discharge   Problem: Physical Regulation: Goal: Complications related to the disease process, condition or treatment will be avoided or minimized Outcome: Adequate for Discharge   Problem: Safety: Goal: Ability to remain free from injury will improve Outcome: Adequate for Discharge

## 2021-11-13 NOTE — Discharge Summary (Signed)
Physician Discharge Summary Note  Patient:  Nathan [name] is an 35 y.o., male MRN:  [id number] DOB:  [birth year] Patient phone:  [phone] (home)  Patient address:   [address] [place] [address],  Total Time spent with patient: 45 minutes  Date of Admission:  11/10/2021 Date of Discharge: 11/13/2021  Reason for Admission: Admitted through the emergency room where he presented intoxicated with complaints of suicidal ideation and depressed mood and some paranoia  Principal Problem: Substance or medication-induced anxiety disorder [facility]) Discharge Diagnoses: Principal Problem:   Substance or medication-induced anxiety disorder (HCC) Active Problems:   Alcohol abuse   Severe recurrent major depression without psychotic features (HCC)   Loosening of tooth   Past Psychiatric History: Past history of multiple presentations virtually identical.  Longstanding alcohol and other drug abuse.  Long history of mood symptoms tied to intoxication.  Has done some self-injury in the past.  Has not stayed on medication or treatment consistently in the past.  Past Medical History:  Past Medical History:  Diagnosis Date   Anxiety    Bipolar 1 disorder (HCC)    Chronic headache    Depression 03/30/2020   ETOH abuse 03/30/2020   Hep C w/o coma, chronic (HCC)    Heroin addiction (HCC)    Horseshoe kidney    Marijuana smoker 03/30/2020    Past Surgical History:  Procedure Laterality Date   TONSILLECTOMY     Family History:  Family History  Problem Relation Age of Onset   Hypertension Mother    Diabetes Mother    Hypertension Father    Heart failure Father    Diabetes Father    Family Psychiatric  History: See previous Social History:  Social History   Substance and Sexual Activity  Alcohol Use Yes   Alcohol/week: 24.0 standard drinks   Types: 24 Cans of beer per week   Comment: 4 gallons a day- pt reports more than this currently     Social History    Substance and Sexual Activity  Drug Use Yes   Types: Marijuana, Cocaine, Benzodiazepines   Comment: Heroin addiction- denies current drug use    Social History   Socioeconomic History   Marital status: Single    Spouse name: Not on file   Number of children: 1   Years of education: 11   Highest education level: 11th grade  Occupational History   Not on file  Tobacco Use   Smoking status: Every Day    Packs/day: 1.00    Types: Cigarettes   Smokeless tobacco: Current    Types: Chew  Vaping Use   Vaping Use: Never used  Substance and Sexual Activity   Alcohol use: Yes    Alcohol/week: 24.0 standard drinks    Types: 24 Cans of beer per week    Comment: 4 gallons a day- pt reports more than this currently   Drug use: Yes    Types: Marijuana, Cocaine, Benzodiazepines    Comment: Heroin addiction- denies current drug use   Sexual activity: Not Currently  Other Topics Concern   Not on file  Social History Narrative   Not on file   Social Determinants of Health   Financial Resource Strain: Not on file  Food Insecurity: Not on file  Transportation Needs: Not on file  Physical Activity: Not on file  Stress: Not on file  Social Connections: Not on file    Hospital Course: Admitted to psychiatric unit.  Detox protocol employed.  Patient  showed some mild to moderate signs of alcohol withdrawal for a day which were treated.  Initially still stated some suicidal ideation while intoxicated or in the first day of detox but now for a day has been consistently denying suicidal ideation.  Mood is upbeat.  Patient claims to have learned a great deal about himself and to feel very confident that he can do better in the future.  Not taking medicine and says that he is not interested in taking medicine.  Patient has been educated as always that alcohol is clearly very toxic to his mood and that he would have a much better outcome if he stopped drinking and furthermore remains at risk for  suicidal ideation if he does continue substance abuse.  Strongly encouraged to follow up with [facility] and Alcoholics Anonymous.  Patient at this point can be discharged on no medication  Physical Findings: AIMS:  , ,  ,  ,    CIWA:  CIWA-Ar Total: 2 COWS:     Musculoskeletal: Strength & Muscle Tone: within normal limits Gait & Station: normal Patient leans: N/A   Psychiatric Specialty Exam:  Presentation  General Appearance: Disheveled; Bizarre  Eye Contact:Minimal  Speech:Pressured; Garbled  Speech Volume:Increased  Handedness:Right   Mood and Affect  Mood:Anxious; Depressed; Hopeless  Affect:Depressed; Inappropriate; Blunt   Thought Process  Thought Processes:Goal Directed  Descriptions of Associations:Intact  Orientation:Full (Time, Place and Person)  Thought Content:Illogical; Paranoid Ideation; Rumination; Tangential  History of Schizophrenia/Schizoaffective disorder:No  Duration of Psychotic Symptoms:No data recorded Hallucinations:No data recorded Ideas of Reference:None  Suicidal Thoughts:No data recorded Homicidal Thoughts:No data recorded  Sensorium  Memory:Remote Fair; Immediate Fair; Recent Fair  Judgment:Impaired  Insight:Present   Executive Functions  Concentration:Fair  Attention Span:Fair  Recall:Fair  Fund of Knowledge:Fair  Language:Fair   Psychomotor Activity  Psychomotor Activity:No data recorded  Assets  Assets:Desire for Improvement; Intimacy; Physical Health; Social Support   Sleep  Sleep:No data recorded   Physical Exam: Physical Exam Vitals and nursing note reviewed.  Constitutional:      Appearance: Normal appearance.  HENT:     Head: Normocephalic and atraumatic.     Mouth/Throat:     Pharynx: Oropharynx is clear.  Eyes:     Pupils: Pupils are equal, round, and reactive to light.  Cardiovascular:     Rate and Rhythm: Normal rate and regular rhythm.  Pulmonary:     Effort: Pulmonary effort is normal.      Breath sounds: Normal breath sounds.  Abdominal:     General: Abdomen is flat.     Palpations: Abdomen is soft.  Musculoskeletal:        General: Normal range of motion.  Skin:    General: Skin is warm and dry.  Neurological:     General: No focal deficit present.     Mental Status: He is alert. Mental status is at baseline.  Psychiatric:        Mood and Affect: Mood normal.        Thought Content: Thought content normal.   Review of Systems  Constitutional: Negative.   HENT: Negative.    Eyes: Negative.   Respiratory: Negative.    Cardiovascular: Negative.   Gastrointestinal: Negative.   Musculoskeletal: Negative.   Skin: Negative.   Neurological: Negative.   Psychiatric/Behavioral: Negative.    Blood pressure 129/87, pulse 98, temperature 98.1 F (36.7 C), temperature source Oral, resp. rate 20, height 5\' 9"  (1.753 m), weight 59.9 kg, SpO2 97 %. Body mass  index is 19.49 kg/m.   Social History   Tobacco Use  Smoking Status Every Day   Packs/day: 1.00   Types: Cigarettes  Smokeless Tobacco Current   Types: Chew   Tobacco Cessation:  A prescription for an FDA-approved tobacco cessation medication was offered at discharge and the patient refused   Blood Alcohol level:  Lab Results  Component Value Date   ETH [facility]) 11/10/2021   ETH <10 08/27/2021    Metabolic Disorder Labs:  Lab Results  Component Value Date   HGBA1C 5.2 02/06/2021   MPG 103 02/06/2021   Lab Results  Component Value Date   PROLACTIN 7.0 04/23/2016   Lab Results  Component Value Date   CHOL 139 02/06/2021   TRIG 55 02/06/2021   HDL 55 02/06/2021   CHOLHDL 2.5 02/06/2021   VLDL 11 02/06/2021   LDLCALC 73 02/06/2021   LDLCALC 105 (H) 04/23/2016    See Psychiatric Specialty Exam and Suicide Risk Assessment completed by Attending Physician prior to discharge.  Discharge destination:  Home  Is patient on multiple antipsychotic therapies at discharge:  No   Has Patient had  three or more failed trials of antipsychotic monotherapy by history:  No  Recommended Plan for Multiple Antipsychotic Therapies: NA  Discharge Instructions     Diet - low sodium heart healthy   Complete by: As directed    Increase activity slowly   Complete by: As directed       Allergies as of 11/13/2021       Reactions   Geodon [ziprasidone Hcl] Other (See Comments)   Seizures   Valproic Acid    Levofloxacin Itching        Medication List     STOP taking these medications    FLUoxetine 20 MG capsule Commonly known as: PROZAC   hydrOXYzine 50 MG tablet Commonly known as: ATARAX/VISTARIL   naltrexone 50 MG tablet Commonly known as: DEPADE   OLANZapine 5 MG tablet Commonly known as: ZYPREXA         Follow-up recommendations: Activity as tolerated.  Regular diet.  Strongly encouraged follow-up with local mental health and substance abuse services  Comments: No prescriptions needed  Signed: Mordecai Rasmussen, MD 11/13/2021, 10:50 AM

## 2021-11-14 ENCOUNTER — Emergency Department
Admission: EM | Admit: 2021-11-14 | Discharge: 2021-11-14 | Disposition: A | Discharge status: Home / Self Care | Payer: Self-pay | Attending: Emergency Medicine | Admitting: Emergency Medicine

## 2021-11-14 ENCOUNTER — Other Ambulatory Visit: Payer: Self-pay

## 2021-11-14 DIAGNOSIS — F10129 Alcohol abuse with intoxication, unspecified: Secondary | ICD-10-CM | POA: Insufficient documentation

## 2021-11-14 DIAGNOSIS — Y903 Blood alcohol level of 60-79 mg/100 ml: Secondary | ICD-10-CM | POA: Insufficient documentation

## 2021-11-14 DIAGNOSIS — F17210 Nicotine dependence, cigarettes, uncomplicated: Secondary | ICD-10-CM | POA: Insufficient documentation

## 2021-11-14 DIAGNOSIS — F1290 Cannabis use, unspecified, uncomplicated: Secondary | ICD-10-CM | POA: Insufficient documentation

## 2021-11-14 DIAGNOSIS — F10920 Alcohol use, unspecified with intoxication, uncomplicated: Secondary | ICD-10-CM

## 2021-11-14 DIAGNOSIS — Z79899 Other long term (current) drug therapy: Secondary | ICD-10-CM | POA: Insufficient documentation

## 2021-11-14 LAB — COMPREHENSIVE METABOLIC PANEL
ALT: 54 U/L — ABNORMAL HIGH (ref 0–44)
AST: 50 U/L — ABNORMAL HIGH (ref 15–41)
Albumin: 4.3 g/dL (ref 3.5–5.0)
Alkaline Phosphatase: 75 U/L (ref 38–126)
Anion gap: 8 (ref 5–15)
BUN: 15 mg/dL (ref 6–20)
CO2: 27 mmol/L (ref 22–32)
Calcium: 8.9 mg/dL (ref 8.9–10.3)
Chloride: 106 mmol/L (ref 98–111)
Creatinine, Ser: 0.78 mg/dL (ref 0.61–1.24)
GFR, Estimated: >60 mL/min (ref >60)
Glucose, Bld: 124 mg/dL — ABNORMAL HIGH (ref 70–99)
Potassium: 3.9 mmol/L (ref 3.5–5.1)
Sodium: 141 mmol/L (ref 135–145)
Total Bilirubin: 0.4 mg/dL (ref 0.3–1.2)
Total Protein: 7.9 g/dL (ref 6.5–8.1)

## 2021-11-14 LAB — URINALYSIS, COMPLETE (UACMP) WITH MICROSCOPIC
Bilirubin Urine: NEGATIVE
Glucose, UA: NEGATIVE mg/dL
Hgb urine dipstick: NEGATIVE
Ketones, ur: NEGATIVE mg/dL
Leukocytes,Ua: NEGATIVE
Nitrite: NEGATIVE
Protein, ur: NEGATIVE mg/dL
Specific Gravity, Urine: >1.03 — ABNORMAL HIGH (ref 1.005–1.030)
pH: 5.5 (ref 5.0–8.0)

## 2021-11-14 LAB — CBC
HCT: 43.5 % (ref 39.0–52.0)
Hemoglobin: 14.9 g/dL (ref 13.0–17.0)
MCH: 33.3 pg (ref 26.0–34.0)
MCHC: 34.3 g/dL (ref 30.0–36.0)
MCV: 97.1 fL (ref 80.0–100.0)
Platelets: 260 10*3/uL (ref 150–400)
RBC: 4.48 MIL/uL (ref 4.22–5.81)
RDW: 14.1 % (ref 11.5–15.5)
WBC: 9.4 10*3/uL (ref 4.0–10.5)
nRBC: 0 % (ref 0.0–0.2)

## 2021-11-14 LAB — SALICYLATE LEVEL: Salicylate Lvl: <7 mg/dL — ABNORMAL LOW (ref 7.0–30.0)

## 2021-11-14 LAB — ETHANOL: Alcohol, Ethyl (B): 70 mg/dL — ABNORMAL HIGH (ref <10)

## 2021-11-14 LAB — ACETAMINOPHEN LEVEL: Acetaminophen (Tylenol), Serum: <10 ug/mL — ABNORMAL LOW (ref 10–30)

## 2021-11-14 MED ORDER — SODIUM CHLORIDE 0.9 % IV BOLUS
1000.0000 mL | Freq: Once | INTRAVENOUS | Status: AC
  Administered 2021-11-14: 1000 mL via INTRAVENOUS

## 2021-11-14 MED ORDER — ALUM & MAG HYDROXIDE-SIMETH 200-200-20 MG/5ML PO SUSP
30.0000 mL | Freq: Once | ORAL | Status: AC
  Administered 2021-11-14: 30 mL via ORAL
  Filled 2021-11-14: qty 30

## 2021-11-14 MED ORDER — ACETAMINOPHEN 500 MG PO TABS
1000.0000 mg | ORAL_TABLET | Freq: Once | ORAL | Status: AC
  Administered 2021-11-14: 1000 mg via ORAL
  Filled 2021-11-14: qty 2

## 2021-11-14 NOTE — ED Notes (Signed)
Signature pad not working- pt gives verbal consent for discharge. Pt states he does not have ride- is walking home. Pt is AOX4, ambulatory without difficulty.

## 2021-11-14 NOTE — ED Notes (Signed)
Pt given sprite 
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
## 2021-11-14 NOTE — ED Provider Notes (Signed)
[facility] Emergency Department Provider Note  Time seen: 8:45 PM  I have reviewed the triage vital signs and the nursing notes.   HISTORY  Chief Complaint Alcohol intoxication   HPI Nathan [name] is a 35 y.o. male with a past medical history of anxiety, bipolar, depression, alcohol abuse, substance abuse, presents to the emergency department for alcohol intoxication.  According to the patient he was trying to get drunk today, drink a bottle of isopropyl alcohol estimated around 20 ounces, drinking beer as well as vodka.  Patient states he was not trying to hurt himself in any way, states he did not have any other form of alcohol so he just wanted to get drunk.  Does admit to marijuana use but denies any other substance.  Is awake alert.  No distress.  Speaking in full sentences.   Past Medical History:  Diagnosis Date   Anxiety    Bipolar 1 disorder (HCC)    Chronic headache    Depression 03/30/2020   ETOH abuse 03/30/2020   Hep C w/o coma, chronic (HCC)    Heroin addiction (HCC)    Horseshoe kidney    Marijuana smoker 03/30/2020    Patient Active Problem List   Diagnosis Date Noted   Loosening of tooth 11/11/2021   Bipolar 1 disorder (HCC) 08/27/2021   Alcohol withdrawal (HCC) 03/25/2021   Bipolar depression (HCC) 03/25/2021   Severe recurrent major depression without psychotic features (HCC) 02/05/2021   Abdominal pain    Moderate alcohol use disorder (HCC) 08/29/2020   Intentional overdose of drug in tablet form (HCC) 08/28/2020   Schizoaffective disorder (HCC) 07/26/2020   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   Chronic headache 06/26/2020   Encounter to establish care 06/26/2020   History of gastroesophageal reflux (GERD) 06/26/2020   Cannabis use disorder, moderate, in controlled environment (HCC) 04/10/2020   Cocaine use disorder (HCC) 04/10/2020   Alcohol use disorder, severe, dependence (HCC) 04/09/2020   Stress reaction, acute 04/09/2020    Non-suicidal self harm 04/05/2020   Bipolar 1 disorder, depressed (HCC) 01/31/2018   Intercostal pain 09/07/2016   Bipolar 1 disorder, mixed, severe (HCC) 04/22/2016   Alcohol abuse 04/22/2016   Suicidal ideation 04/22/2016   Tobacco use disorder 04/22/2016   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   Cocaine abuse in remission (HCC) 04/26/2012    Past Surgical History:  Procedure Laterality Date   TONSILLECTOMY      Prior to Admission medications   Not on File    Allergies  Allergen Reactions   Geodon [Ziprasidone Hcl] Other (See Comments)    Seizures   Valproic Acid    Levofloxacin Itching    Family History  Problem Relation Age of Onset   Hypertension Mother    Diabetes Mother    Hypertension Father    Heart failure Father    Diabetes Father     Social History Social History   Tobacco Use   Smoking status: Every Day    Packs/day: 1.00    Types: Cigarettes   Smokeless tobacco: Current    Types: Chew  Vaping Use   Vaping Use: Never used  Substance Use Topics   Alcohol use: Yes    Alcohol/week: 24.0 standard drinks    Types: 24 Cans of beer per week    Comment: 4 gallons a day- pt reports more than this currently   Drug use: Yes    Types: Marijuana, Cocaine, Benzodiazepines    Comment: Heroin addiction- denies current  drug use    Review of Systems Constitutional: Negative for fever. Cardiovascular: Negative for chest pain. Respiratory: Negative for shortness of breath. Gastrointestinal: Negative for abdominal pain.  Negative for vomiting. Genitourinary: Negative for urinary compaints Musculoskeletal: Negative for musculoskeletal complaints Neurological: Mild headache All other ROS negative  ____________________________________________   PHYSICAL EXAM:  Constitutional: Alert and oriented. Well appearing and in no distress. Eyes: Normal exam ENT      Head: Normocephalic and atraumatic.      Mouth/Throat: Mucous  membranes are moist. Cardiovascular: Normal rate, regular rhythm.  Respiratory: Normal respiratory effort without tachypnea nor retractions. Breath sounds are clear  Gastrointestinal: Soft and nontender. No distention Musculoskeletal: Nontender with normal range of motion in all extremities.  Neurologic:  Normal speech and language. No gross focal neurologic deficits  Skin:  Skin is warm, dry and intact.  Psychiatric: Mood and affect are normal.  Denies SI or HI.  ____________________________________________    EKG  EKG viewed and interpreted by myself shows a normal sinus rhythm at 78 bpm with a narrow QRS, normal axis, normal intervals, no concerning ST changes.  ____________________________________________   INITIAL IMPRESSION / ASSESSMENT AND PLAN / ED COURSE  Pertinent labs & imaging results that were available during my care of the patient were reviewed by me and considered in my medical decision making (see chart for details).   Patient presents emergency department for alcohol intoxication.  Admits to drinking isopropyl alcohol this morning, beer in the afternoon and liquor this evening.  Overall patient appears well, denies any abdominal pain or vomiting does state a moderate headache.  Denies any suicide intent, states he was just trying to get drunk and that is all he had to drink.  We will check labs, EKG is reassuring.  We will start the patient on IV fluids and continue to closely monitor.  Lab work is largely nonrevealing.  Patient appears well.  I believe any isopropyl ingestion would have well metabolized out of his system at this time.  The patient is awake alert oriented appears well does not appear intoxicated labs are largely within normal limits besides a mild ethanol level of 70 I believe the patient is safe for discharge home.  He is currently working on getting a ride.  Nathan [name] was evaluated in Emergency Department on 11/14/2021 for the symptoms  described in the history of present illness. He was evaluated in the context of the global COVID-19 pandemic, which necessitated consideration that the patient might be at risk for infection with the SARS-CoV-2 virus that causes COVID-19. Institutional protocols and algorithms that pertain to the evaluation of patients at risk for COVID-19 are in a state of rapid change based on information released by regulatory bodies including the CDC and federal and state organizations. These policies and algorithms were followed during the patient's care in the ED.  ____________________________________________   FINAL CLINICAL IMPRESSION(S) / ED DIAGNOSES  Alcohol intoxication   Minna Antis, MD 11/14/21 2248

## 2021-11-14 NOTE — ED Notes (Signed)
Pt assisted to bathroom for UA sample.

## 2021-11-15 LAB — URINE DRUG SCREEN, QUALITATIVE (ARMC ONLY)
Amphetamines, Ur Screen: NOT DETECTED
Barbiturates, Ur Screen: NOT DETECTED
Benzodiazepine, Ur Scrn: POSITIVE — AB
Cannabinoid 50 Ng, Ur ~~LOC~~: POSITIVE — AB
Cocaine Metabolite,Ur ~~LOC~~: NOT DETECTED
MDMA (Ecstasy)Ur Screen: NOT DETECTED
Methadone Scn, Ur: NOT DETECTED
Opiate, Ur Screen: NOT DETECTED
Phencyclidine (PCP) Ur S: NOT DETECTED
Tricyclic, Ur Screen: NOT DETECTED

## 2022-03-29 ENCOUNTER — Other Ambulatory Visit: Payer: Self-pay

## 2022-03-29 ENCOUNTER — Encounter: Payer: Self-pay | Admitting: Emergency Medicine

## 2022-03-29 ENCOUNTER — Emergency Department
Admission: EM | Admit: 2022-03-29 | Discharge: 2022-03-29 | Disposition: A | Discharge status: Home / Self Care | Payer: Self-pay | Attending: Emergency Medicine | Admitting: Emergency Medicine

## 2022-03-29 ENCOUNTER — Emergency Department: Payer: Self-pay

## 2022-03-29 DIAGNOSIS — F19980 Other psychoactive substance use, unspecified with psychoactive substance-induced anxiety disorder: Secondary | ICD-10-CM | POA: Diagnosis present

## 2022-03-29 DIAGNOSIS — Z20822 Contact with and (suspected) exposure to covid-19: Secondary | ICD-10-CM | POA: Insufficient documentation

## 2022-03-29 DIAGNOSIS — M79675 Pain in left toe(s): Secondary | ICD-10-CM | POA: Insufficient documentation

## 2022-03-29 DIAGNOSIS — R45851 Suicidal ideations: Secondary | ICD-10-CM | POA: Insufficient documentation

## 2022-03-29 LAB — COMPREHENSIVE METABOLIC PANEL
ALT: 36 U/L (ref 0–44)
AST: 31 U/L (ref 15–41)
Albumin: 4.6 g/dL (ref 3.5–5.0)
Alkaline Phosphatase: 84 U/L (ref 38–126)
Anion gap: 7 (ref 5–15)
BUN: 15 mg/dL (ref 6–20)
CO2: 27 mmol/L (ref 22–32)
Calcium: 9.8 mg/dL (ref 8.9–10.3)
Chloride: 102 mmol/L (ref 98–111)
Creatinine, Ser: 0.77 mg/dL (ref 0.61–1.24)
GFR, Estimated: >60 mL/min (ref >60)
Glucose, Bld: 106 mg/dL — ABNORMAL HIGH (ref 70–99)
Potassium: 4.2 mmol/L (ref 3.5–5.1)
Sodium: 136 mmol/L (ref 135–145)
Total Bilirubin: 0.6 mg/dL (ref 0.3–1.2)
Total Protein: 8.7 g/dL — ABNORMAL HIGH (ref 6.5–8.1)

## 2022-03-29 LAB — CBC
HCT: 45.3 % (ref 39.0–52.0)
Hemoglobin: 15.4 g/dL (ref 13.0–17.0)
MCH: 32.3 pg (ref 26.0–34.0)
MCHC: 34 g/dL (ref 30.0–36.0)
MCV: 95 fL (ref 80.0–100.0)
Platelets: 256 10*3/uL (ref 150–400)
RBC: 4.77 MIL/uL (ref 4.22–5.81)
RDW: 13.4 % (ref 11.5–15.5)
WBC: 7.7 10*3/uL (ref 4.0–10.5)
nRBC: 0 % (ref 0.0–0.2)

## 2022-03-29 LAB — URINE DRUG SCREEN, QUALITATIVE (ARMC ONLY)
Amphetamines, Ur Screen: POSITIVE — AB
Barbiturates, Ur Screen: NOT DETECTED
Benzodiazepine, Ur Scrn: NOT DETECTED
Cannabinoid 50 Ng, Ur ~~LOC~~: POSITIVE — AB
Cocaine Metabolite,Ur ~~LOC~~: NOT DETECTED
MDMA (Ecstasy)Ur Screen: NOT DETECTED
Methadone Scn, Ur: NOT DETECTED
Opiate, Ur Screen: NOT DETECTED
Phencyclidine (PCP) Ur S: NOT DETECTED
Tricyclic, Ur Screen: NOT DETECTED

## 2022-03-29 LAB — RESP PANEL BY RT-PCR (FLU A&B, COVID) ARPGX2
Influenza A by PCR: NEGATIVE
Influenza B by PCR: NEGATIVE
SARS Coronavirus 2 by RT PCR: NEGATIVE

## 2022-03-29 LAB — ETHANOL: Alcohol, Ethyl (B): <10 mg/dL (ref <10)

## 2022-03-29 LAB — ACETAMINOPHEN LEVEL: Acetaminophen (Tylenol), Serum: <10 ug/mL — ABNORMAL LOW (ref 10–30)

## 2022-03-29 LAB — SALICYLATE LEVEL: Salicylate Lvl: <7 mg/dL — ABNORMAL LOW (ref 7.0–30.0)

## 2022-03-29 MED ORDER — ACETAMINOPHEN 500 MG PO TABS
1000.0000 mg | ORAL_TABLET | Freq: Once | ORAL | Status: AC
  Administered 2022-03-29: 1000 mg via ORAL
  Filled 2022-03-29: qty 2

## 2022-03-29 MED ORDER — IBUPROFEN 600 MG PO TABS
600.0000 mg | ORAL_TABLET | Freq: Once | ORAL | Status: AC
  Administered 2022-03-29: 600 mg via ORAL
  Filled 2022-03-29: qty 1
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
## 2022-03-29 NOTE — ED Notes (Signed)
Pt to interview room in hallway for evaluation with behavioral health providers   ? ?
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
## 2022-03-29 NOTE — ED Notes (Signed)
Breakfast and cranberry juice provided. Pt requesting telephone    ?

## 2022-03-29 NOTE — ED Notes (Signed)
Pt dressed out at this time by myself and Ariel EDT: ? ?Pt belongings: ? ?Black coat, camoflauge pants, black belt, white socks, blue socks, black boots, black bag with strap, black  underwear, ? ?Glasses (pt wearing) ? ?No cell phone or cash in possession per pt. ?

## 2022-03-29 NOTE — ED Notes (Signed)
IVC  papers  rescinded  informed  Nathan Lush  RN ?

## 2022-03-29 NOTE — ED Notes (Signed)
Pt returned from evaluation with behavioral health providers  ?
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
## 2022-03-29 NOTE — Discharge Instructions (Signed)
Cleared by psych for discharge  

## 2022-06-14 ENCOUNTER — Other Ambulatory Visit: Payer: Self-pay

## 2022-06-14 ENCOUNTER — Emergency Department
Admission: EM | Admit: 2022-06-14 | Discharge: 2022-06-15 | Disposition: A | Discharge status: Home / Self Care | Payer: 59 | Attending: Emergency Medicine | Admitting: Emergency Medicine

## 2022-06-14 DIAGNOSIS — R45851 Suicidal ideations: Secondary | ICD-10-CM | POA: Insufficient documentation

## 2022-06-14 DIAGNOSIS — F3132 Bipolar disorder, current episode depressed, moderate: Secondary | ICD-10-CM | POA: Insufficient documentation

## 2022-06-14 DIAGNOSIS — F332 Major depressive disorder, recurrent severe without psychotic features: Secondary | ICD-10-CM | POA: Diagnosis present

## 2022-06-14 DIAGNOSIS — F19980 Other psychoactive substance use, unspecified with psychoactive substance-induced anxiety disorder: Secondary | ICD-10-CM | POA: Diagnosis present

## 2022-06-14 DIAGNOSIS — Z20822 Contact with and (suspected) exposure to covid-19: Secondary | ICD-10-CM | POA: Insufficient documentation

## 2022-06-14 LAB — CBC
HCT: 48.3 % (ref 39.0–52.0)
Hemoglobin: 16 g/dL (ref 13.0–17.0)
MCH: 31.1 pg (ref 26.0–34.0)
MCHC: 33.1 g/dL (ref 30.0–36.0)
MCV: 93.8 fL (ref 80.0–100.0)
Platelets: 316 10*3/uL (ref 150–400)
RBC: 5.15 MIL/uL (ref 4.22–5.81)
RDW: 13.4 % (ref 11.5–15.5)
WBC: 10.5 10*3/uL (ref 4.0–10.5)
nRBC: 0 % (ref 0.0–0.2)

## 2022-06-14 LAB — ETHANOL: Alcohol, Ethyl (B): <10 mg/dL (ref <10)

## 2022-06-14 LAB — COMPREHENSIVE METABOLIC PANEL
ALT: 41 U/L (ref 0–44)
AST: 33 U/L (ref 15–41)
Albumin: 4.5 g/dL (ref 3.5–5.0)
Alkaline Phosphatase: 84 U/L (ref 38–126)
Anion gap: 7 (ref 5–15)
BUN: 18 mg/dL (ref 6–20)
CO2: 28 mmol/L (ref 22–32)
Calcium: 9.8 mg/dL (ref 8.9–10.3)
Chloride: 105 mmol/L (ref 98–111)
Creatinine, Ser: 0.76 mg/dL (ref 0.61–1.24)
GFR, Estimated: >60 mL/min (ref >60)
Glucose, Bld: 100 mg/dL — ABNORMAL HIGH (ref 70–99)
Potassium: 4.1 mmol/L (ref 3.5–5.1)
Sodium: 140 mmol/L (ref 135–145)
Total Bilirubin: 1 mg/dL (ref 0.3–1.2)
Total Protein: 8.5 g/dL — ABNORMAL HIGH (ref 6.5–8.1)

## 2022-06-14 LAB — URINE DRUG SCREEN, QUALITATIVE (ARMC ONLY)
Amphetamines, Ur Screen: POSITIVE — AB
Barbiturates, Ur Screen: NOT DETECTED
Benzodiazepine, Ur Scrn: NOT DETECTED
Cannabinoid 50 Ng, Ur ~~LOC~~: POSITIVE — AB
Cocaine Metabolite,Ur ~~LOC~~: NOT DETECTED
MDMA (Ecstasy)Ur Screen: NOT DETECTED
Methadone Scn, Ur: NOT DETECTED
Opiate, Ur Screen: NOT DETECTED
Phencyclidine (PCP) Ur S: NOT DETECTED
Tricyclic, Ur Screen: NOT DETECTED

## 2022-06-14 LAB — SALICYLATE LEVEL: Salicylate Lvl: <7 mg/dL — ABNORMAL LOW (ref 7.0–30.0)

## 2022-06-14 LAB — ACETAMINOPHEN LEVEL: Acetaminophen (Tylenol), Serum: <10 ug/mL — ABNORMAL LOW (ref 10–30)

## 2022-06-14 LAB — RESP PANEL BY RT-PCR (FLU A&B, COVID) ARPGX2
Influenza A by PCR: NEGATIVE
Influenza B by PCR: NEGATIVE
SARS Coronavirus 2 by RT PCR: NEGATIVE

## 2022-06-14 MED ORDER — LORAZEPAM 1 MG PO TABS
1.0000 mg | ORAL_TABLET | Freq: Once | ORAL | Status: AC
  Administered 2022-06-14: 1 mg via ORAL
  Filled 2022-06-14: qty 1

## 2022-06-14 MED ORDER — ACETAMINOPHEN 500 MG PO TABS
1000.0000 mg | ORAL_TABLET | Freq: Once | ORAL | Status: AC
  Administered 2022-06-14: 1000 mg via ORAL
  Filled 2022-06-14: qty 2

## 2022-06-14 MED ORDER — ACETAMINOPHEN 325 MG PO TABS
650.0000 mg | ORAL_TABLET | Freq: Once | ORAL | Status: AC
  Administered 2022-06-14: 650 mg via ORAL
  Filled 2022-06-14: qty 2

## 2022-06-14 NOTE — ED Notes (Signed)
Pt given a sandwich tray ?

## 2022-06-14 NOTE — ED Notes (Signed)
Pt stated, "hell I am voluntary, I can go home and blow my fucking brains out. Yall done took my bed and pillow away and not giving me any more tylenol. Yall better figure this shit out if I got to hear this shit all night."
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
## 2022-06-14 NOTE — ED Notes (Addendum)
Pt dressed out in triage room with this writer and Delaney Meigs, EDT.    Pt belongings:  Pair of brown pants  One brown shoe (pt requested [place] to throw shoe away) Pair of gray socks Orange tank top  Black belt  Orange underwear Purple Counsellor with white cord  Black cell phone  Small blue backpack  Pt has small gray ring to right ring finger that is unable to come off.    This Clinical research associate wrote down list of important numbers for pt on sticky note for pt to keep on him.

## 2022-06-14 NOTE — BH Assessment (Addendum)
Referral information for Psychiatric Hospitalization faxed to:   [facility] ([phone]- [phone]),    [facility] [phone]), Per Elnita Maxwell, there are no available beds at this time.   [address] [phone]),    [facility] [phone] -or- [phone]),    [facility] [phone])   [facility] [phone]).   [facility] [phone])

## 2022-06-14 NOTE — ED Notes (Signed)
Pt given dinner tray and drink at this time. 

## 2022-06-14 NOTE — ED Notes (Signed)
Pt sleeping at this time.  NAD.
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
## 2022-06-15 DIAGNOSIS — F19980 Other psychoactive substance use, unspecified with psychoactive substance-induced anxiety disorder: Secondary | ICD-10-CM

## 2022-06-15 MED ORDER — HYDROXYZINE HCL 25 MG PO TABS
50.0000 mg | ORAL_TABLET | Freq: Three times a day (TID) | ORAL | Status: DC | PRN
Start: 2022-06-15 — End: 2022-06-15

## 2022-06-15 NOTE — ED Notes (Signed)
Writer in 24 hallway where pt standing with shirt off and yelling due to construction noises causing him to have a headache and not be able to sleep. Pt with both hands continuously rubbing up the sides of head and stating, "Im going fucking crazy man. I have asked for something for my head and no one is getting it. I cant fucking sleep because of all this fucking noise. Im sorry but I'm about to lose it." Writer speaking with pt and ensuring that he is getting medicine for his headache as well as ensuring that staff is doing everything possible to accommodate for loud construction noises but that it would continue throughout the night. Pt beginning to calm and agrees to put shirt back on and lay in bed. Bed provided and new blanket.

## 2022-06-15 NOTE — Consult Note (Signed)
[facility] Psych ED Progress Note  06/15/2022 10:56 AM Nathan [name]  MRN:  [id number]   Method of visit?: Face to Face   Subjective:  Patient states he is not suicidal anymore and is asking for discharge. He is voluntary and does not meet criteria for involuntary commitment. He agrees that substance use has a negative impact on his  mood.  Patient has had adequate time for the substances to metabolize. He speaks in clear, coherent, sentences. Encouraged patient to follow up with out patient services for mood and substance use.   Principal Problem: Substance or medication-induced anxiety disorder (HCC) Diagnosis:  Principal Problem:   Substance or medication-induced anxiety disorder (HCC) Active Problems:   Suicidal ideation   Severe recurrent major depression without psychotic features (HCC)  Total Time spent with patient: 15 minutes  Past Psychiatric History: see previous  Past Medical History:  Past Medical History:  Diagnosis Date   Anxiety    Bipolar 1 disorder (HCC)    Chronic headache    Depression 03/30/2020   ETOH abuse 03/30/2020   Hep C w/o coma, chronic (HCC)    Heroin addiction (HCC)    Horseshoe kidney    Marijuana smoker 03/30/2020    Past Surgical History:  Procedure Laterality Date   TONSILLECTOMY     Family History:  Family History  Problem Relation Age of Onset   Hypertension Mother    Diabetes Mother    Hypertension Father    Heart failure Father    Diabetes Father    Family Psychiatric  History:  Social History:  Social History   Substance and Sexual Activity  Alcohol Use Yes   Alcohol/week: 24.0 standard drinks of alcohol   Types: 24 Cans of beer per week   Comment: 4 gallons a day- pt reports more than this currently     Social History   Substance and Sexual Activity  Drug Use Yes   Types: Marijuana, Cocaine, Benzodiazepines   Comment: Heroin addiction- denies current drug use    Social History   Socioeconomic History   Marital  status: Single    Spouse name: Not on file   Number of children: 1   Years of education: 11   Highest education level: 11th grade  Occupational History   Not on file  Tobacco Use   Smoking status: Every Day    Packs/day: 1.00    Types: Cigarettes   Smokeless tobacco: Current    Types: Chew  Vaping Use   Vaping Use: Never used  Substance and Sexual Activity   Alcohol use: Yes    Alcohol/week: 24.0 standard drinks of alcohol    Types: 24 Cans of beer per week    Comment: 4 gallons a day- pt reports more than this currently   Drug use: Yes    Types: Marijuana, Cocaine, Benzodiazepines    Comment: Heroin addiction- denies current drug use   Sexual activity: Not Currently  Other Topics Concern   Not on file  Social History Narrative   Not on file   Social Determinants of Health   Financial Resource Strain: Not on file  Food Insecurity: Not on file  Transportation Needs: Not on file  Physical Activity: Not on file  Stress: Not on file  Social Connections: Not on file    Sleep: Good  Appetite:  Good  Current Medications: Current Facility-Administered Medications  Medication Dose Route Frequency Provider Last Rate Last Admin   hydrOXYzine (ATARAX) tablet 50 mg  50 mg  Oral TID PRN Sherlon Handing, NP       No current outpatient medications on file.    Lab Results:  Results for orders placed or performed during the hospital encounter of 06/14/22 (from the past 48 hour(s))  Resp Panel by RT-PCR (Flu A&B, Covid) Anterior Nasal Swab     Status: None   Collection Time: 06/14/22  1:34 AM   Specimen: Anterior Nasal Swab  Result Value Ref Range   SARS Coronavirus 2 by RT PCR NEGATIVE NEGATIVE    Comment: (NOTE) SARS-CoV-2 target nucleic acids are NOT DETECTED.  The SARS-CoV-2 RNA is generally detectable in upper respiratory specimens during the acute phase of infection. The lowest concentration of SARS-CoV-2 viral copies this assay can detect is 138 copies/mL. A  negative result does not preclude SARS-Cov-2 infection and should not be used as the sole basis for treatment or other patient management decisions. A negative result may occur with  improper specimen collection/handling, submission of specimen other than nasopharyngeal swab, presence of viral mutation(s) within the areas targeted by this assay, and inadequate number of viral copies(<138 copies/mL). A negative result must be combined with clinical observations, patient history, and epidemiological information. The expected result is Negative.  Fact Sheet for Patients:  EntrepreneurPulse.com.au  Fact Sheet for Healthcare Providers:  IncredibleEmployment.be  This test is no t yet approved or cleared by the [place] FDA and  has been authorized for detection and/or diagnosis of SARS-CoV-2 by FDA under an Emergency Use Authorization (EUA). This EUA will remain  in effect (meaning this test can be used) for the duration of the COVID-19 declaration under Section 564(b)(1) of the Act, 21 U.S.C.section 360bbb-3(b)(1), unless the authorization is terminated  or revoked sooner.       Influenza A by PCR NEGATIVE NEGATIVE   Influenza B by PCR NEGATIVE NEGATIVE    Comment: (NOTE) The Xpert Xpress SARS-CoV-2/FLU/RSV plus assay is intended as an aid in the diagnosis of influenza from Nasopharyngeal swab specimens and should not be used as a sole basis for treatment. Nasal washings and aspirates are unacceptable for Xpert Xpress SARS-CoV-2/FLU/RSV testing.  Fact Sheet for Patients: EntrepreneurPulse.com.au  Fact Sheet for Healthcare Providers: IncredibleEmployment.be  This test is not yet approved or cleared by the [place] FDA and has been authorized for detection and/or diagnosis of SARS-CoV-2 by FDA under an Emergency Use Authorization (EUA). This EUA will remain in effect (meaning this test can be used)  for the duration of the COVID-19 declaration under Section 564(b)(1) of the Act, 21 U.S.C. section 360bbb-3(b)(1), unless the authorization is terminated or revoked.  Performed at [facility], [address] [place], [place] [address]   Comprehensive metabolic panel     Status: Abnormal   Collection Time: 06/14/22  1:34 AM  Result Value Ref Range   Sodium 140 135 - 145 mmol/L   Potassium 4.1 3.5 - 5.1 mmol/L   Chloride 105 98 - 111 mmol/L   CO2 28 22 - 32 mmol/L   Glucose, Bld 100 (H) 70 - 99 mg/dL    Comment: Glucose reference range applies only to samples taken after fasting for at least 8 hours.   BUN 18 6 - 20 mg/dL   Creatinine, Ser 0.76 0.61 - 1.24 mg/dL   Calcium 9.8 8.9 - 10.3 mg/dL   Total Protein 8.5 (H) 6.5 - 8.1 g/dL   Albumin 4.5 3.5 - 5.0 g/dL   AST 33 15 - 41 U/L   ALT 41 0 -  44 U/L   Alkaline Phosphatase 84 38 - 126 U/L   Total Bilirubin 1.0 0.3 - 1.2 mg/dL   GFR, Estimated >60 >60 mL/min    Comment: (NOTE) Calculated using the CKD-EPI Creatinine Equation (2021)    Anion gap 7 5 - 15    Comment: Performed at [facility], [address] [place], [place] [address]  Ethanol     Status: None   Collection Time: 06/14/22  1:34 AM  Result Value Ref Range   Alcohol, Ethyl (B) <10 <10 mg/dL    Comment: (NOTE) Lowest detectable limit for serum alcohol is 10 mg/dL.  For medical purposes only. Performed at [facility], [address] [place], [place] [id number]   Salicylate level     Status: Abnormal   Collection Time: 06/14/22  1:34 AM  Result Value Ref Range   Salicylate Lvl [id number] (L) 7.0 - 30.0 mg/dL    Comment: Performed at [facility], [address] [place], [place] [address]  Acetaminophen level     Status: Abnormal   Collection Time: 06/14/22  1:34 AM  Result Value Ref Range   Acetaminophen (Tylenol), Serum <10 (L) 10 - 30 ug/mL    Comment: (NOTE) Therapeutic concentrations vary significantly. A range of  10-30 ug/mL  may be an effective concentration for many patients. However, some  are best treated at concentrations outside of this range. Acetaminophen concentrations >150 ug/mL at 4 hours after ingestion  and >50 ug/mL at 12 hours after ingestion are often associated with  toxic reactions.  Performed at [facility], [address] [place], [place] [address]   cbc     Status: None   Collection Time: 06/14/22  1:34 AM  Result Value Ref Range   WBC 10.5 4.0 - 10.5 K/uL   RBC 5.15 4.22 - 5.81 MIL/uL   Hemoglobin 16.0 13.0 - 17.0 g/dL   HCT 48.3 39.0 - 52.0 %   MCV 93.8 80.0 - 100.0 fL   MCH 31.1 26.0 - 34.0 pg   MCHC 33.1 30.0 - 36.0 g/dL   RDW 13.4 11.5 - 15.5 %   Platelets 316 150 - 400 K/uL   nRBC 0.0 0.0 - 0.2 %    Comment: Performed at [facility], [address] [place], [place] [address]  Urine Drug Screen, Qualitative     Status: Abnormal   Collection Time: 06/14/22  1:34 AM  Result Value Ref Range   Tricyclic, Ur Screen NONE DETECTED NONE DETECTED   Amphetamines, Ur Screen POSITIVE (A) NONE DETECTED   MDMA (Ecstasy)Ur Screen NONE DETECTED NONE DETECTED   Cocaine Metabolite,Ur [place] NONE DETECTED NONE DETECTED   Opiate, Ur Screen NONE DETECTED NONE DETECTED   Phencyclidine (PCP) Ur S NONE DETECTED NONE DETECTED   Cannabinoid 50 Ng, Ur [place] POSITIVE (A) NONE DETECTED   Barbiturates, Ur Screen NONE DETECTED NONE DETECTED   Benzodiazepine, Ur Scrn NONE DETECTED NONE DETECTED   Methadone Scn, Ur NONE DETECTED NONE DETECTED    Comment: (NOTE) Tricyclics + metabolites, urine    Cutoff 1000 ng/mL Amphetamines + metabolites, urine  Cutoff 1000 ng/mL MDMA (Ecstasy), urine              Cutoff 500 ng/mL Cocaine Metabolite, urine          Cutoff 300 ng/mL Opiate + metabolites, urine        Cutoff 300 ng/mL Phencyclidine (PCP), urine         Cutoff 25 ng/mL  Cannabinoid, urine                 Cutoff 50 ng/mL Barbiturates + metabolites, urine  Cutoff 200  ng/mL Benzodiazepine, urine              Cutoff 200 ng/mL Methadone, urine                   Cutoff 300 ng/mL  The urine drug screen provides only a preliminary, unconfirmed analytical test result and should not be used for non-medical purposes. Clinical consideration and professional judgment should be applied to any positive drug screen result due to possible interfering substances. A more specific alternate chemical method must be used in order to obtain a confirmed analytical result. Gas chromatography / mass spectrometry (GC/MS) is the preferred confirm atory method. Performed at [facility], [address] [place], [place] [address]     Blood Alcohol level:  Lab Results  Component Value Date   [facility] <10 06/14/2022   ETH <10 03/29/2022    Physical Findings: AIMS:  , ,  ,  ,    CIWA:    COWS:     Musculoskeletal: Strength & Muscle Tone: within normal limits Gait & Station: normal Patient leans: N/A  Psychiatric Specialty Exam:  Presentation  General Appearance: Appropriate for Environment  Eye Contact:Good  Speech:Clear and Coherent  Speech Volume:Normal  Handedness:Right   Mood and Affect  Mood:Euthymic  Affect:Congruent   Thought Process  Thought Processes:Goal Directed  Descriptions of Associations:Intact  Orientation:Full (Time, Place and Person)  Thought Content:WDL  History of Schizophrenia/Schizoaffective disorder:No  Duration of Psychotic Symptoms:No data recorded Hallucinations:Hallucinations: None  Ideas of Reference:None  Suicidal Thoughts:Suicidal Thoughts: No SI Passive Intent and/or Plan: With Intent (states he will "get a gun")  Homicidal Thoughts:Homicidal Thoughts: No   Sensorium  Memory:Immediate Good; Recent Good  Judgment:Fair  Insight:Fair   Executive Functions  Concentration:Fair  Attention Span:Fair  Recall:Fair  Fund of Knowledge:Fair  Language:Fair   Psychomotor Activity   Psychomotor Activity:Psychomotor Activity: Normal   Assets  Assets:Resilience   Sleep  Sleep:Sleep: Good    Physical Exam: Physical Exam Vitals reviewed.  HENT:     Head: Normocephalic.     Nose: No congestion or rhinorrhea.  Eyes:     General:        Right eye: No discharge.        Left eye: No discharge.  Cardiovascular:     Rate and Rhythm: Normal rate.  Pulmonary:     Effort: Pulmonary effort is normal.  Musculoskeletal:        General: Normal range of motion.  Neurological:     Mental Status: He is alert and oriented to person, place, and time.  Psychiatric:        Attention and Perception: Attention normal.        Mood and Affect: Mood normal.        Speech: Speech normal.        Behavior: Behavior is cooperative.        Thought Content: Thought content normal.        Cognition and Memory: Cognition normal.    Review of Systems  Psychiatric/Behavioral:  Positive for depression (chronic/stable) and substance abuse. Negative for hallucinations, memory loss and suicidal ideas. The patient is not nervous/anxious and does not have insomnia.   All other systems reviewed and are negative.  Blood pressure 94/64, pulse 70, temperature 97.7 F (36.5 C), temperature source Oral, resp. rate  16, height 5\' 9"  (1.753 m), weight 64.4 kg, SpO2 98 %. Body mass index is 20.97 kg/m.  Treatment Plan Summary: Plan Discharge with outpatients resources, [facility]. Reviewed with EDP  Sherlon Handing, NP 06/15/2022, 10:56 AM

## 2022-06-15 NOTE — ED Notes (Signed)
Report received from Duard Larsen, English as a second language teacher. On initial round after report Pt is warm/dry, resting quietly in room without any s/s of distress.  Will continue to monitor throughout shift as ordered for any changes in behaviors and for continued safety.

## 2022-06-15 NOTE — ED Provider Notes (Signed)
Patient has been holding in the ED awaiting psychiatric inpatient placement for the past 36 hours after presenting with feeling suicidal and anxious in the setting of smoking methamphetamines and marijuana.  Patient reports he feels much better this morning and is requesting discharge.  He reports he was fine able to get some sleep last night after using earplugs to quiet noise from nearby construction.  He has been evaluated by psychiatry this morning and they acknowledge that he seems better and is voluntary, would be suitable for outpatient management.  I discussed with the patient the importance of following up with [facility] health services, abstaining from recreational drug use that may worsen his mental health and we discussed close return precautions for the ED.  He was provided with resources for local [facility] follow-up.   Nathan Prairie, MD 06/15/22 1036

## 2022-07-21 ENCOUNTER — Encounter: Payer: Self-pay | Admitting: Emergency Medicine

## 2022-07-21 ENCOUNTER — Emergency Department
Admission: EM | Admit: 2022-07-21 | Discharge: 2022-07-22 | Discharge status: Against Medical Advice | Payer: Self-pay | Attending: Emergency Medicine | Admitting: Emergency Medicine

## 2022-07-21 ENCOUNTER — Other Ambulatory Visit: Payer: Self-pay

## 2022-07-21 DIAGNOSIS — R7989 Other specified abnormal findings of blood chemistry: Secondary | ICD-10-CM | POA: Insufficient documentation

## 2022-07-21 DIAGNOSIS — Z5329 Procedure and treatment not carried out because of patient's decision for other reasons: Secondary | ICD-10-CM | POA: Insufficient documentation

## 2022-07-21 DIAGNOSIS — E876 Hypokalemia: Secondary | ICD-10-CM | POA: Insufficient documentation

## 2022-07-21 DIAGNOSIS — Z760 Encounter for issue of repeat prescription: Secondary | ICD-10-CM | POA: Insufficient documentation

## 2022-07-21 DIAGNOSIS — F418 Other specified anxiety disorders: Secondary | ICD-10-CM | POA: Insufficient documentation

## 2022-07-21 DIAGNOSIS — Y900 Blood alcohol level of less than 20 mg/100 ml: Secondary | ICD-10-CM | POA: Insufficient documentation

## 2022-07-21 DIAGNOSIS — F1910 Other psychoactive substance abuse, uncomplicated: Secondary | ICD-10-CM | POA: Insufficient documentation

## 2022-07-21 LAB — ETHANOL: Alcohol, Ethyl (B): <10 mg/dL (ref <10)

## 2022-07-21 LAB — COMPREHENSIVE METABOLIC PANEL
ALT: 51 U/L — ABNORMAL HIGH (ref 0–44)
AST: 42 U/L — ABNORMAL HIGH (ref 15–41)
Albumin: 3.6 g/dL (ref 3.5–5.0)
Alkaline Phosphatase: 61 U/L (ref 38–126)
Anion gap: 7 (ref 5–15)
BUN: 10 mg/dL (ref 6–20)
CO2: 22 mmol/L (ref 22–32)
Calcium: 8.9 mg/dL (ref 8.9–10.3)
Chloride: 111 mmol/L (ref 98–111)
Creatinine, Ser: 0.69 mg/dL (ref 0.61–1.24)
GFR, Estimated: >60 mL/min (ref >60)
Glucose, Bld: 119 mg/dL — ABNORMAL HIGH (ref 70–99)
Potassium: 3.3 mmol/L — ABNORMAL LOW (ref 3.5–5.1)
Sodium: 140 mmol/L (ref 135–145)
Total Bilirubin: 0.5 mg/dL (ref 0.3–1.2)
Total Protein: 6.7 g/dL (ref 6.5–8.1)

## 2022-07-21 LAB — CBC
HCT: 37 % — ABNORMAL LOW (ref 39.0–52.0)
Hemoglobin: 12.2 g/dL — ABNORMAL LOW (ref 13.0–17.0)
MCH: 31 pg (ref 26.0–34.0)
MCHC: 33 g/dL (ref 30.0–36.0)
MCV: 94.1 fL (ref 80.0–100.0)
Platelets: 236 10*3/uL (ref 150–400)
RBC: 3.93 MIL/uL — ABNORMAL LOW (ref 4.22–5.81)
RDW: 13.1 % (ref 11.5–15.5)
WBC: 6.9 10*3/uL (ref 4.0–10.5)
nRBC: 0 % (ref 0.0–0.2)

## 2022-07-21 LAB — SALICYLATE LEVEL: Salicylate Lvl: <7 mg/dL — ABNORMAL LOW (ref 7.0–30.0)

## 2022-07-21 LAB — ACETAMINOPHEN LEVEL: Acetaminophen (Tylenol), Serum: <10 ug/mL — ABNORMAL LOW (ref 10–30)

## 2022-07-21 NOTE — ED Triage Notes (Signed)
Pt to ED via EMS picked up outside of stranger's house.  EMS states patient used meth this morning and is now withdrawing.  Pt is drowsy in triage, difficulty concentrating and staying awake to answer questions, speech is mumbled and hard to understand.  Patient does state that he is here to have his medications refilled and get back on anxiety and depression meds.  Has not taken them for two weeks.  Denies other drugs or alcohol use.  EMS vitals 122/77 BP, 75 HR, 98.4 temp, 134 CBG, 98% RA.  Denies pain or SOB, denies SI or HI at this time.

## 2022-07-21 NOTE — ED Notes (Signed)
Nathan Noe, RN spoke to patient who then admits to drinking today and using drugs but doesn't say which.  Patient also nods yes when asked if having thoughts of harming self but doesn't elaborate.  Patient dressed out by this RN and Ariel, EDT.  Belongings placed into 1 bag and kept at hospital.  Contents include: 1 pair gray shoes, 1 black t shirt, 1 pair blue jeans, 1 white necklace, 1 purple bag, 1 white ring, orange boxers, black belt.

## 2022-07-22 NOTE — ED Notes (Signed)
Pt given breakfast tray

## 2022-07-22 NOTE — ED Provider Notes (Signed)
11:05 AM Assumed care for off going team.   Blood pressure 123/77, pulse (!) 55, temperature 98.3 F (36.8 C), temperature source Axillary, resp. rate 16, height 5\' 9"  (1.753 m), weight 71.7 kg, SpO2 98 %.  See their HPI for full report but in brief pending re-eval   Patient awake moving all extremities able to ambulate.  He denies any SI he is tolerating eating.  When I discussed patient being able to be discharged home given no SI.  Patient reports getting angry stating that he at least needs to eat his food and and he needs a phone to use.  I have got called to a [facility] and when I came back patient had left the ER.  There is no indication for IVC given he did not appear to be a danger to self or others.      , MD 07/22/22 1256

## 2022-07-22 NOTE — ED Provider Notes (Signed)
[facility] Provider Note    Event Date/Time   First MD Initiated Contact with Patient 07/21/22 2359     (approximate)   History   Medication Refill and Psychiatric Evaluation   HPI    [facility] Eltz[facility]th is a 36 y.o. male with well-documented psychiatric illness (schizoaffective disorder and bipolar disorder) and polysubstance abuse.  He has had multiple visits to the emergency department in the past for polysubstance and psychiatric issues.  He presents tonight by EMS after being picked up outside of a strangers house.  Reportedly the patient says he used meth this morning and is now "withdrawing".  He is somnolent and just wants to sleep.  He stated something to triage about how he needs to have his medications refilled to get back on anxiety and depression medications but he has not been on that for 2 weeks.  He did not express any suicidal ideation or homicidal ideation.  When I tried to examine him he would wake up briefly and then immediately fall back to sleep.      Physical Exam   Triage Vital Signs: ED Triage Vitals  Enc Vitals Group     BP 07/21/22 2253 (!) 105/57     Pulse Rate 07/21/22 2253 86     Resp 07/21/22 2253 16     Temp 07/21/22 2253 98.5 F (36.9 C)     Temp Source 07/21/22 2253 Oral     SpO2 07/21/22 2253 98 %     Weight 07/21/22 2254 71.7 kg (158 lb)     Height 07/21/22 2254 1.753 m (5\' 9" )     Head Circumference --      Peak Flow --      Pain Score 07/21/22 2253 0     Pain Loc --      Pain Edu? --      Excl. in GC? --     Most recent vital signs: Vitals:   07/22/22 0006 07/22/22 0645  BP: 116/77 115/83  Pulse: 65 (!) 55  Resp: 18 17  Temp:    SpO2: 97% 98%     General: Somnolent, no distress. CV:  Good peripheral perfusion.  Normal heart sounds. Resp:  Normal effort.  Lungs are clear to auscultation bilaterally. Abd:  No distention.  No tenderness to palpation. Other:  Patient appears to be suffering some  sort of intoxication or the aftereffects of having taken a stimulant like methamphetamine.  He is not immediately presenting a danger to himself.   ED Results / Procedures / Treatments   Labs (all labs ordered are listed, but only abnormal results are displayed) Labs Reviewed  COMPREHENSIVE METABOLIC PANEL - Abnormal; Notable for the following components:      Result Value   Potassium 3.3 (*)    Glucose, Bld 119 (*)    AST 42 (*)    ALT 51 (*)    All other components within normal limits  SALICYLATE LEVEL - Abnormal; Notable for the following components:   Salicylate Lvl <7.0 (*)    All other components within normal limits  ACETAMINOPHEN LEVEL - Abnormal; Notable for the following components:   Acetaminophen (Tylenol), Serum <10 (*)    All other components within normal limits  CBC - Abnormal; Notable for the following components:   RBC 3.93 (*)    Hemoglobin 12.2 (*)    HCT 37.0 (*)    All other components within normal limits  ETHANOL  URINE DRUG SCREEN, QUALITATIVE (  [facility] ONLY)     PROCEDURES:  Critical Care performed: No  Procedures   MEDICATIONS ORDERED IN ED: Medications - No data to display   IMPRESSION / MDM / ASSESSMENT AND PLAN / ED COURSE  I reviewed the triage vital signs and the nursing notes.                              Differential diagnosis includes, but is not limited to, polysubstance abuse/intoxication, metabolic or electrolyte abnormality, acute infection, exacerbation of chronic psychiatric illness.  Patient's presentation is most consistent with exacerbation of chronic illness.  The patient is on the cardiac monitor to evaluate for evidence of arrhythmia and/or significant heart rate changes.  He is also on pulse oximeter since he is very sleepy.  No sign of trauma.  As per protocol, I ordered the following labs as part of the patient's medical and psychiatric evaluation:  CBC, CMP, ethanol level, acetaminophen level, salicylate level, urine  drug screen.  Labs are all essentially normal other than mild hypokalemia and very mild LFT elevation which is of unlikely clinical significance.  He has not provided a urine specimen.  We will let the patient sleep and monitor him to determine if he needs any additional care or treatment.   Clinical Course as of 07/22/22 0724  Thu Jul 22, 2022  0630 Patient still sleeping, not yet appropriate for discharge or psychiatric evaluation if in fact he requires it.  I anticipate he will probably wake up and decide he wants to leave. [CF]  [id number] Dr. Fuller Plan will reassess patient once he is awake. [CF]    Clinical Course User Index [CF] Loleta Rose, MD     FINAL CLINICAL IMPRESSION(S) / ED DIAGNOSES   Final diagnoses:  Polysubstance abuse (HCC)     Rx / DC Orders   ED Discharge Orders     None        Note:  This document was prepared using Dragon voice recognition software and may include unintentional dictation errors.   Loleta Rose, MD 07/22/22 [phone]

## 2022-07-22 NOTE — ED Notes (Signed)
Pt awake, demanding to see nurse and cursing and yelling- Dr Fuller Plan in to assess pt

## 2022-07-22 NOTE — ED Notes (Signed)
Pt demanding phone and when told he could not have it until 1300 he demanded his stuff so he could leave- Dr Fuller Plan aware and okay with letting him leave- pt given belongings

## 2022-12-07 ENCOUNTER — Emergency Department
Admission: EM | Admit: 2022-12-07 | Discharge: 2022-12-07 | Disposition: A | Discharge status: Other Acute Inpatient Hospital | Payer: 59 | Attending: Emergency Medicine | Admitting: Emergency Medicine

## 2022-12-07 ENCOUNTER — Other Ambulatory Visit: Payer: Self-pay

## 2022-12-07 ENCOUNTER — Inpatient Hospital Stay
Admission: AD | Admit: 2022-12-07 | Discharge: 2022-12-10 | DRG: 885 | Disposition: A | Discharge status: Home / Self Care | Payer: 59 | Source: Intra-hospital | Attending: Psychiatry | Admitting: Psychiatry

## 2022-12-07 DIAGNOSIS — F17210 Nicotine dependence, cigarettes, uncomplicated: Secondary | ICD-10-CM | POA: Diagnosis present

## 2022-12-07 DIAGNOSIS — F3163 Bipolar disorder, current episode mixed, severe, without psychotic features: Secondary | ICD-10-CM | POA: Diagnosis present

## 2022-12-07 DIAGNOSIS — F321 Major depressive disorder, single episode, moderate: Secondary | ICD-10-CM | POA: Diagnosis not present

## 2022-12-07 DIAGNOSIS — R45851 Suicidal ideations: Secondary | ICD-10-CM | POA: Diagnosis present

## 2022-12-07 DIAGNOSIS — Z833 Family history of diabetes mellitus: Secondary | ICD-10-CM

## 2022-12-07 DIAGNOSIS — F1020 Alcohol dependence, uncomplicated: Secondary | ICD-10-CM | POA: Diagnosis present

## 2022-12-07 DIAGNOSIS — Z1152 Encounter for screening for COVID-19: Secondary | ICD-10-CM | POA: Insufficient documentation

## 2022-12-07 DIAGNOSIS — F1590 Other stimulant use, unspecified, uncomplicated: Secondary | ICD-10-CM | POA: Diagnosis present

## 2022-12-07 DIAGNOSIS — F1914 Other psychoactive substance abuse with psychoactive substance-induced mood disorder: Secondary | ICD-10-CM | POA: Insufficient documentation

## 2022-12-07 DIAGNOSIS — F1010 Alcohol abuse, uncomplicated: Secondary | ICD-10-CM | POA: Diagnosis present

## 2022-12-07 DIAGNOSIS — F17200 Nicotine dependence, unspecified, uncomplicated: Secondary | ICD-10-CM | POA: Diagnosis present

## 2022-12-07 DIAGNOSIS — F259 Schizoaffective disorder, unspecified: Secondary | ICD-10-CM | POA: Diagnosis present

## 2022-12-07 DIAGNOSIS — F319 Bipolar disorder, unspecified: Secondary | ICD-10-CM | POA: Diagnosis present

## 2022-12-07 DIAGNOSIS — Z8249 Family history of ischemic heart disease and other diseases of the circulatory system: Secondary | ICD-10-CM

## 2022-12-07 DIAGNOSIS — Z9152 Personal history of nonsuicidal self-harm: Secondary | ICD-10-CM | POA: Diagnosis not present

## 2022-12-07 DIAGNOSIS — T50902A Poisoning by unspecified drugs, medicaments and biological substances, intentional self-harm, initial encounter: Secondary | ICD-10-CM | POA: Diagnosis present

## 2022-12-07 DIAGNOSIS — F1410 Cocaine abuse, uncomplicated: Secondary | ICD-10-CM | POA: Diagnosis present

## 2022-12-07 DIAGNOSIS — F1411 Cocaine abuse, in remission: Secondary | ICD-10-CM | POA: Diagnosis present

## 2022-12-07 DIAGNOSIS — F332 Major depressive disorder, recurrent severe without psychotic features: Secondary | ICD-10-CM | POA: Diagnosis present

## 2022-12-07 DIAGNOSIS — F10939 Alcohol use, unspecified with withdrawal, unspecified: Secondary | ICD-10-CM | POA: Diagnosis present

## 2022-12-07 DIAGNOSIS — S61211A Laceration without foreign body of left index finger without damage to nail, initial encounter: Secondary | ICD-10-CM | POA: Insufficient documentation

## 2022-12-07 DIAGNOSIS — G8929 Other chronic pain: Secondary | ICD-10-CM | POA: Diagnosis present

## 2022-12-07 DIAGNOSIS — R519 Headache, unspecified: Secondary | ICD-10-CM | POA: Insufficient documentation

## 2022-12-07 DIAGNOSIS — F1220 Cannabis dependence, uncomplicated: Secondary | ICD-10-CM | POA: Diagnosis present

## 2022-12-07 DIAGNOSIS — F19980 Other psychoactive substance use, unspecified with psychoactive substance-induced anxiety disorder: Secondary | ICD-10-CM | POA: Diagnosis present

## 2022-12-07 DIAGNOSIS — X58XXXA Exposure to other specified factors, initial encounter: Secondary | ICD-10-CM | POA: Insufficient documentation

## 2022-12-07 LAB — CBC WITH DIFFERENTIAL/PLATELET
Abs Immature Granulocytes: 0.01 10*3/uL (ref 0.00–0.07)
Basophils Absolute: 0 10*3/uL (ref 0.0–0.1)
Basophils Relative: 1 %
Eosinophils Absolute: 0.3 10*3/uL (ref 0.0–0.5)
Eosinophils Relative: 4 %
HCT: 45.4 % (ref 39.0–52.0)
Hemoglobin: 14.4 g/dL (ref 13.0–17.0)
Immature Granulocytes: 0 %
Lymphocytes Relative: 45 %
Lymphs Abs: 3.2 10*3/uL (ref 0.7–4.0)
MCH: 30.3 pg (ref 26.0–34.0)
MCHC: 31.7 g/dL (ref 30.0–36.0)
MCV: 95.4 fL (ref 80.0–100.0)
Monocytes Absolute: 0.6 10*3/uL (ref 0.1–1.0)
Monocytes Relative: 9 %
Neutro Abs: 2.8 10*3/uL (ref 1.7–7.7)
Neutrophils Relative %: 41 %
Platelets: 260 10*3/uL (ref 150–400)
RBC: 4.76 MIL/uL (ref 4.22–5.81)
RDW: 13.6 % (ref 11.5–15.5)
WBC: 6.8 10*3/uL (ref 4.0–10.5)
nRBC: 0 % (ref 0.0–0.2)

## 2022-12-07 LAB — COMPREHENSIVE METABOLIC PANEL
ALT: 54 U/L — ABNORMAL HIGH (ref 0–44)
AST: 44 U/L — ABNORMAL HIGH (ref 15–41)
Albumin: 3.7 g/dL (ref 3.5–5.0)
Alkaline Phosphatase: 70 U/L (ref 38–126)
Anion gap: 4 — ABNORMAL LOW (ref 5–15)
BUN: 12 mg/dL (ref 6–20)
CO2: 27 mmol/L (ref 22–32)
Calcium: 8.9 mg/dL (ref 8.9–10.3)
Chloride: 108 mmol/L (ref 98–111)
Creatinine, Ser: 0.57 mg/dL — ABNORMAL LOW (ref 0.61–1.24)
GFR, Estimated: >60 mL/min (ref >60)
Glucose, Bld: 83 mg/dL (ref 70–99)
Potassium: 4.1 mmol/L (ref 3.5–5.1)
Sodium: 139 mmol/L (ref 135–145)
Total Bilirubin: 0.6 mg/dL (ref 0.3–1.2)
Total Protein: 7.2 g/dL (ref 6.5–8.1)

## 2022-12-07 LAB — ACETAMINOPHEN LEVEL: Acetaminophen (Tylenol), Serum: <10 ug/mL — ABNORMAL LOW (ref 10–30)

## 2022-12-07 LAB — SALICYLATE LEVEL: Salicylate Lvl: 7.5 mg/dL (ref 7.0–30.0)

## 2022-12-07 LAB — ETHANOL: Alcohol, Ethyl (B): <10 mg/dL (ref <10)

## 2022-12-07 LAB — RESP PANEL BY RT-PCR (FLU A&B, COVID) ARPGX2
Influenza A by PCR: NEGATIVE
Influenza B by PCR: NEGATIVE
SARS Coronavirus 2 by RT PCR: NEGATIVE

## 2022-12-07 MED ORDER — TRAZODONE HCL 100 MG PO TABS: 100.0000 mg | ORAL_TABLET | Freq: Every evening | ORAL | Status: DC | PRN

## 2022-12-07 MED ORDER — IBUPROFEN 600 MG PO TABS: 600.0000 mg | ORAL_TABLET | Freq: Four times a day (QID) | ORAL | Status: DC | PRN

## 2022-12-07 MED ORDER — CEPHALEXIN 500 MG PO CAPS: 500.0000 mg | ORAL_CAPSULE | Freq: Two times a day (BID) | ORAL | 0 refills | Status: DC

## 2022-12-07 MED ORDER — CEPHALEXIN 500 MG PO CAPS
500.0000 mg | ORAL_CAPSULE | Freq: Two times a day (BID) | ORAL | Status: DC
  Administered 2022-12-08 – 2022-12-10 (×3): 500 mg via ORAL
  Filled 2022-12-07 (×4): qty 1

## 2022-12-07 MED ORDER — ALUM & MAG HYDROXIDE-SIMETH 200-200-20 MG/5ML PO SUSP: 30.0000 mL | ORAL | Status: DC | PRN

## 2022-12-07 MED ORDER — ACETAMINOPHEN 325 MG PO TABS
650.0000 mg | ORAL_TABLET | Freq: Four times a day (QID) | ORAL | Status: DC | PRN
  Administered 2022-12-10: 650 mg via ORAL
  Filled 2022-12-07: qty 2

## 2022-12-07 MED ORDER — MAGNESIUM HYDROXIDE 400 MG/5ML PO SUSP: 30.0000 mL | Freq: Every day | ORAL | Status: DC | PRN

## 2022-12-07 MED ORDER — CEPHALEXIN 500 MG PO CAPS
500.0000 mg | ORAL_CAPSULE | Freq: Two times a day (BID) | ORAL | Status: DC
  Administered 2022-12-07: 500 mg via ORAL
  Filled 2022-12-07: qty 1

## 2022-12-07 MED ORDER — ALBUTEROL SULFATE HFA 108 (90 BASE) MCG/ACT IN AERS: 1.0000 | INHALATION_SPRAY | Freq: Four times a day (QID) | RESPIRATORY_TRACT | Status: DC | PRN

## 2022-12-07 NOTE — Plan of Care (Signed)

## 2022-12-07 NOTE — ED Notes (Signed)
Pt ate 100% of dinner tray.  Waste discarded appropriately at this time.

## 2022-12-07 NOTE — ED Notes (Signed)
Pt asleep, snack not offered at this time.

## 2022-12-07 NOTE — Tx Team (Signed)
Initial Treatment Plan 12/07/2022 11:52 PM Nathan [name] [id number]    PATIENT STRESSORS: Medication change or noncompliance   Substance abuse     PATIENT STRENGTHS: Ability for insight  Motivation for treatment/growth    PATIENT IDENTIFIED PROBLEMS: Suicidal Thoughts   Substance abuse                   DISCHARGE CRITERIA:  Adequate post-discharge living arrangements Motivation to continue treatment in a less acute level of care  PRELIMINARY DISCHARGE PLAN: Outpatient therapy Placement in alternative living arrangements  PATIENT/FAMILY INVOLVEMENT: This treatment plan has been presented to and reviewed with the patient, Nathan [name]. The patient has been given the opportunity to ask questions and make suggestions.  Elmyra Ricks, RN 12/07/2022, 11:52 PM

## 2022-12-07 NOTE — ED Provider Triage Note (Signed)
Emergency Medicine Provider Triage Evaluation Note  Nathan [name] , a 36 y.o. male  was evaluated in triage.  Pt complains of SI. Reports he has attempted in the past by overdose. Plans to "blow my brains out." Reports "of course" he has access to firearm. Reports his life has been getting worse.  Review of Systems  Positive: SI Negative: HI/AVH  Physical Exam  There were no vitals taken for this visit. Gen:   Awake, no distress , tearful Resp:  Normal effort  MSK:   Moves extremities without difficulty  Other:    Medical Decision Making  Medically screening exam initiated at 4:01 PM.  Appropriate orders placed.  [name] was informed that the remainder of the evaluation will be completed by another provider, this initial triage assessment does not replace that evaluation, and the importance of remaining in the ED until their evaluation is complete.     Jackelyn Hoehn, PA-C 12/07/22 1603

## 2022-12-07 NOTE — Consult Note (Signed)
[facility]   Reason for Consult:Suicidal  Referring Physician: Dr. Fanny BienQuale Patient Identification: Nathan KatosHarmon [name] [name] MRN:  [id number] Principal Diagnosis: <principal problem not specified> Diagnosis:  Active Problems:   Substance or medication-induced anxiety disorder (HCC)   Bipolar 1 disorder, mixed, severe (HCC)   Alcohol abuse   Suicidal ideation   Tobacco use disorder   Bipolar 1 disorder, depressed (HCC)   Chronic headache   Schizoaffective disorder (HCC)   Intentional overdose of drug in tablet form (HCC)   Alcohol use disorder, severe, dependence (HCC)   Cannabis use disorder, moderate, in controlled environment (HCC)   Cocaine abuse in remission (HCC)   Amphetamine use   Cocaine use disorder (HCC)   Moderate alcohol use disorder (HCC)   Severe recurrent major depression without psychotic features (HCC)   Alcohol withdrawal (HCC)   Bipolar depression (HCC)   Bipolar 1 disorder (HCC)   Total Time spent with patient: 1 hour  Subjective: "What?"  Nathan SlickHarmon [name] Dewaine [name] is a 36 y.o. male patient [facility]. Per the triage nurses note, the patient reports "doesn't know where to turn to". Reports his life is a disaster and he is depressed. Reports stopped drugs and stopped drinking 1 year ago and his life got worse. Patient having suicidal thoughts with intentions and plan of killing himself with a gun and has access to this. Patient has attempted SI over a year ago with attempting to cut his arm off and taking a hand full of pills. Patient tearful in triage.   This provider saw The patient face-to-face; the chart was reviewed, and Dr. Fanny BienQuale was consulted on 12/07/2022 due to the patient's care. It was discussed with the EDP that the patient does meet the criteria to be admitted to the psychiatric inpatient unit due to the patient sharing that he has been sober from alcohol and all drug use except for marijuana for about one year.  He  continues to struggle with depression and reports that, at times, he will feel suicidal, and last night, he considered shooting himself with a gun. On evaluation, the patient refuses to wake up and answer questions. The patient can be cooperative but refuses to participate in the assessment process. He is responsive when his name is called, but he is being difficult. The patient does not appear to be responding to internal or external stimuli. Neither is the patient presenting with any delusional thinking. The patient admitted to suicidal ideation. The patient is not presenting with any psychotic or paranoid behaviors.   HPI: [facility] Dr[facility] Fanny BienQuale, [name] is a 36 y.o. male with a history of schizoaffective disorder and bipolar disorder and polysubstance abuse. Patient reports he has been sober from alcohol and all drug use except for marijuana for about 1 year.  He is continue to struggle though with depression, and reports that at times he will feel suicidal and last night he considered shooting himself with a gun.   He comes in today after he talked with his family about this and they brought him to the ER for evaluation.   He denies any recent illness.  No fevers no chills, he just recently got over a very mild sinus infection which has resolved.  Past Psychiatric History:  Anxiety Bipolar 1 disorder (HCC) Chronic headache Depression ETOH abuse Hep C w/o coma, chronic (HCC) Heroin addiction (HCC) Marijuana smoker   Risk to Self:   Risk to Others:   Prior Inpatient Therapy:  Prior Outpatient Therapy:    Past Medical History:  Past Medical History:  Diagnosis Date   Anxiety    Bipolar 1 disorder (HCC)    Chronic headache    Depression 03/30/2020   ETOH abuse 03/30/2020   Hep C w/o coma, chronic (HCC)    Heroin addiction (HCC)    Horseshoe kidney    Marijuana smoker 03/30/2020    Past Surgical History:  Procedure Laterality Date   TONSILLECTOMY     Family History:   Family History  Problem Relation Age of Onset   Hypertension Mother    Diabetes Mother    Hypertension Father    Heart failure Father    Diabetes Father    Family Psychiatric  History: History reviewed. No pertinent family psychiatric history Social History:  Social History   Substance and Sexual Activity  Alcohol Use Not Currently   Alcohol/week: 24.0 standard drinks of alcohol   Types: 24 Cans of beer per week   Comment: 4 gallons a day- pt reports more than this currently     Social History   Substance and Sexual Activity  Drug Use Not Currently   Types: Marijuana, Cocaine, Benzodiazepines   Comment: Heroin addiction- denies current drug use    Social History   Socioeconomic History   Marital status: Single    Spouse name: Not on file   Number of children: 1   Years of education: 11   Highest education level: 11th grade  Occupational History   Not on file  Tobacco Use   Smoking status: Every Day    Packs/day: 1.00    Types: Cigarettes   Smokeless tobacco: Current    Types: Chew  Vaping Use   Vaping Use: Never used  Substance and Sexual Activity   Alcohol use: Not Currently    Alcohol/week: 24.0 standard drinks of alcohol    Types: 24 Cans of beer per week    Comment: 4 gallons a day- pt reports more than this currently   Drug use: Not Currently    Types: Marijuana, Cocaine, Benzodiazepines    Comment: Heroin addiction- denies current drug use   Sexual activity: Not Currently  Other Topics Concern   Not on file  Social History Narrative   Not on file   Social Determinants of Health   Financial Resource Strain: Not on file  Food Insecurity: Not on file  Transportation Needs: Not on file  Physical Activity: Not on file  Stress: Not on file  Social Connections: Not on file   Additional Social History:    Allergies:   Allergies  Allergen Reactions   Geodon [Ziprasidone Hcl] Other (See Comments)    Seizures   Valproic Acid    Levofloxacin  Itching    Labs:  Results for orders placed or performed during the hospital encounter of 12/07/22 (from the past 48 hour(s))  Comprehensive metabolic panel     Status: Abnormal   Collection Time: 12/07/22  4:39 PM  Result Value Ref Range   Sodium 139 135 - 145 mmol/L   Potassium 4.1 3.5 - 5.1 mmol/L   Chloride 108 98 - 111 mmol/L   CO2 27 22 - 32 mmol/L   Glucose, Bld 83 70 - 99 mg/dL    Comment: Glucose reference range applies only to samples taken after fasting for at least 8 hours.   BUN 12 6 - 20 mg/dL   Creatinine, Ser 0.24 (L) 0.61 - 1.24 mg/dL   Calcium 8.9 8.9 - 10.3  mg/dL   Total Protein 7.2 6.5 - 8.1 g/dL   Albumin 3.7 3.5 - 5.0 g/dL   AST 44 (H) 15 - 41 U/L   ALT 54 (H) 0 - 44 U/L   Alkaline Phosphatase 70 38 - 126 U/L   Total Bilirubin 0.6 0.3 - 1.2 mg/dL   GFR, Estimated >16 >10 mL/min    Comment: (NOTE) Calculated using the CKD-EPI Creatinine Equation (2021)    Anion gap 4 (L) 5 - 15    Comment: Performed at [facility], [address], [place], [place] [address]  Ethanol     Status: None   Collection Time: 12/07/22  4:39 PM  Result Value Ref Range   Alcohol, Ethyl (B) <10 <10 mg/dL    Comment: (NOTE) Lowest detectable limit for serum alcohol is 10 mg/dL.  For medical purposes only. Performed at [facility], [address] [place], [place] [address]   CBC with Diff     Status: None   Collection Time: 12/07/22  4:39 PM  Result Value Ref Range   WBC 6.8 4.0 - 10.5 K/uL   RBC 4.76 4.22 - 5.81 MIL/uL   Hemoglobin 14.4 13.0 - 17.0 g/dL   HCT 19.1 47.8 - 29.5 %   MCV 95.4 80.0 - 100.0 fL   MCH 30.3 26.0 - 34.0 pg   MCHC 31.7 30.0 - 36.0 g/dL   RDW 62.1 30.8 - 65.7 %   Platelets 260 150 - 400 K/uL   nRBC 0.0 0.0 - 0.2 %   Neutrophils Relative % 41 %   Neutro Abs 2.8 1.7 - 7.7 K/uL   Lymphocytes Relative 45 %   Lymphs Abs 3.2 0.7 - 4.0 K/uL   Monocytes Relative 9 %   Monocytes Absolute 0.6 0.1 - 1.0 K/uL   Eosinophils Relative 4  %   Eosinophils Absolute 0.3 0.0 - 0.5 K/uL   Basophils Relative 1 %   Basophils Absolute 0.0 0.0 - 0.1 K/uL   Immature Granulocytes 0 %   Abs Immature Granulocytes 0.01 0.00 - 0.07 K/uL    Comment: Performed at [facility], [address] [place], [place] [address]  Salicylate level     Status: None   Collection Time: 12/07/22  4:39 PM  Result Value Ref Range   Salicylate Lvl 7.5 7.0 - 30.0 mg/dL    Comment: Performed at [facility], [address] [place], [place] [address]  Acetaminophen level     Status: Abnormal   Collection Time: 12/07/22  4:39 PM  Result Value Ref Range   Acetaminophen (Tylenol), Serum <10 (L) 10 - 30 ug/mL    Comment: (NOTE) Therapeutic concentrations vary significantly. A range of 10-30 ug/mL  may be an effective concentration for many patients. However, some  are best treated at concentrations outside of this range. Acetaminophen concentrations >150 ug/mL at 4 hours after ingestion  and >50 ug/mL at 12 hours after ingestion are often associated with  toxic reactions.  Performed at [facility], [address] [place], [place] [address]   Resp Panel by RT-PCR (Flu A&B, Covid) Anterior Nasal Swab     Status: None   Collection Time: 12/07/22  9:01 PM   Specimen: Anterior Nasal Swab  Result Value Ref Range   SARS Coronavirus 2 by RT PCR NEGATIVE NEGATIVE    Comment: (NOTE) SARS-CoV-2 target nucleic acids are NOT DETECTED.  The SARS-CoV-2 RNA is generally detectable in upper respiratory specimens during the acute phase of  infection. The lowest concentration of SARS-CoV-2 viral copies this assay can detect is 138 copies/mL. A negative result does not preclude SARS-Cov-2 infection and should not be used as the sole basis for treatment or other patient management decisions. A negative result may occur with  improper specimen collection/handling, submission of specimen other than nasopharyngeal swab, presence of viral  mutation(s) within the areas targeted by this assay, and inadequate number of viral copies(<138 copies/mL). A negative result must be combined with clinical observations, patient history, and epidemiological information. The expected result is Negative.  Fact Sheet for Patients:  BloggerCourse.com  Fact Sheet for Healthcare Providers:  SeriousBroker.it  This test is no t yet approved or cleared by the [place] FDA and  has been authorized for detection and/or diagnosis of SARS-CoV-2 by FDA under an Emergency Use Authorization (EUA). This EUA will remain  in effect (meaning this test can be used) for the duration of the COVID-19 declaration under Section 564(b)(1) of the Act, 21 U.S.C.section 360bbb-3(b)(1), unless the authorization is terminated  or revoked sooner.       Influenza A by PCR NEGATIVE NEGATIVE   Influenza B by PCR NEGATIVE NEGATIVE    Comment: (NOTE) The Xpert Xpress SARS-CoV-2/FLU/RSV plus assay is intended as an aid in the diagnosis of influenza from Nasopharyngeal swab specimens and should not be used as a sole basis for treatment. Nasal washings and aspirates are unacceptable for Xpert Xpress SARS-CoV-2/FLU/RSV testing.  Fact Sheet for Patients: BloggerCourse.com  Fact Sheet for Healthcare Providers: SeriousBroker.it  This test is not yet approved or cleared by the [place] FDA and has been authorized for detection and/or diagnosis of SARS-CoV-2 by FDA under an Emergency Use Authorization (EUA). This EUA will remain in effect (meaning this test can be used) for the duration of the COVID-19 declaration under Section 564(b)(1) of the Act, 21 U.S.C. section 360bbb-3(b)(1), unless the authorization is terminated or revoked.  Performed at [facility], [address] [place], [place] [address]     Current Facility-Administered  Medications  Medication Dose Route Frequency Provider Last Rate Last Admin   cephALEXin (KEFLEX) capsule 500 mg  500 mg Oral BID Sharyn Creamer, MD   500 mg at 12/07/22 2104   ibuprofen (ADVIL) tablet 600 mg  600 mg Oral Q6H PRN Sharyn Creamer, MD       Current Outpatient Medications  Medication Sig Dispense Refill   albuterol (VENTOLIN HFA) 108 (90 Base) MCG/ACT inhaler Inhale 1-2 puffs into the lungs every 6 (six) hours as needed for wheezing or shortness of breath.     cephALEXin (KEFLEX) 500 MG capsule Take 1 capsule (500 mg total) by mouth 2 (two) times daily. 14 capsule 0    Musculoskeletal: Strength & Muscle Tone: within normal limits Gait & Station: normal Patient leans: N/A  Psychiatric Specialty Exam:  Presentation  General Appearance:  Bizarre  Eye Contact: None  Speech: Garbled  Speech Volume: Decreased  Handedness: Right   Mood and Affect  Mood: Angry; Irritable  Affect: Inappropriate   Thought Process  Thought Processes: Irrevelant  Descriptions of Associations:Circumstantial  Orientation:Full (Time, Place and Person)  Thought Content:Logical  History of Schizophrenia/Schizoaffective disorder:No  Duration of Psychotic Symptoms:No data recorded Hallucinations:Hallucinations: None  Ideas of Reference:None  Suicidal Thoughts:Suicidal Thoughts: Yes, Active SI Active Intent and/or Plan: With Intent; With Plan; With Means to Carry Out  Homicidal Thoughts:Homicidal Thoughts: No   Sensorium  Memory: Immediate Poor; Recent Poor; Remote Poor  Judgment: Poor  Insight: Poor  Executive Functions  Concentration: Poor  Attention Span: Poor  Recall: Poor  Fund of Knowledge: Poor  Language: Poor   Psychomotor Activity  Psychomotor Activity: Psychomotor Activity: Normal   Assets  Assets: Resilience   Sleep  Sleep: Sleep: Good Number of Hours of Sleep: 7   Physical Exam: Physical Exam Vitals and nursing note  reviewed.  Constitutional:      Appearance: Normal appearance. He is ill-appearing.  HENT:     Head: Normocephalic and atraumatic.     Right Ear: External ear normal.     Left Ear: External ear normal.     Nose: Nose normal.     Mouth/Throat:     Mouth: Mucous membranes are dry.  Cardiovascular:     Rate and Rhythm: Normal rate.     Pulses: Normal pulses.  Pulmonary:     Effort: Pulmonary effort is normal.  Musculoskeletal:        General: Normal range of motion.     Cervical back: Normal range of motion and neck supple.  Neurological:     General: No focal deficit present.     Mental Status: He is alert and oriented to person, place, and time.  Psychiatric:        Attention and Perception: Attention and perception normal.        Mood and Affect: Mood is depressed. Affect is blunt, flat and inappropriate.        Speech: Speech is delayed.        Behavior: Behavior is uncooperative, agitated and withdrawn.        Thought Content: Thought content includes suicidal ideation. Thought content includes suicidal plan.        Cognition and Memory: Cognition normal.        Judgment: Judgment is inappropriate.    Review of Systems  Psychiatric/Behavioral:  Positive for depression, substance abuse and suicidal ideas.    Blood pressure (!) 158/93, pulse 91, temperature 98.4 F (36.9 C), temperature source Oral, resp. rate 18, height 5\' 9"  (1.753 m), weight 71.7 kg, SpO2 97 %. Body mass index is 23.33 kg/m.  Treatment Plan Summary: Medication management and Plan   Patient does meet criteria for geriatric-psychiatric inpatient admission  Disposition: Recommend psychiatric Inpatient admission when medically cleared. Supportive therapy provided about ongoing stressors.  , NP 12/07/2022 9:57 PM

## 2022-12-07 NOTE — ED Triage Notes (Signed)
Reports "doesn't know where to turn to".  Reports his life is a disaster and he is depressed.  Reports stopped drugs and stopped drinking 1 year ago and his life got worse.  Patient having suicidal thoughts with intentions and plan of killing himself with a gun and has access to this.  Patient has attempted SI over a year ago with attempting to cut his arm off and taking a hand full of pills.  Patient tearful in triage.

## 2022-12-07 NOTE — Progress Notes (Signed)
Patient admitted from [facility] - ED, report received from [facility], [place]. Patient calm and pleasant during assessment. Pt endorses passive SI, verbally contracts for safety. Pt also endorses anxiety and depression, denies HI/AVH. Pt oriented to the unit and his room, given snack. Pt given education, support, and encouragement to be active in his treatment plan. Patient skin assessment completed with Cleo, RN. No abnormalities found. Patient being monitored Q 15 minutes for safety per unit protocol, remains safe on the unit

## 2022-12-07 NOTE — BH Assessment (Signed)
Comprehensive Clinical Assessment (CCA) Screening, Triage and Referral Note  12/07/2022 Nathan [name] [id number] Recommendations for Services/Supports/Treatments: Consulted with Nathan [name]., NP, who determined that pt meets inpatient psychiatric criteria. Notified Dr. Fanny [name] and Nathan Ohm, RN of disposition recommendation.   Nathan [name] "Nathan [name]" is a 36 year old Caucasian male with a psych hx of bipolar disorder and schizoaffective d/o. Pt also has a hx polysubstance abuse. Per triage note: Reports "doesn't know where to turn to". Reports his life is a disaster and he is depressed. Reports stopped drugs and stopped drinking 1 year ago and his life got worse. Patient having suicidal thoughts with intentions and plan of killing himself with a gun and has access to this. Patient has attempted SI over a year ago with attempting to cut his arm off and taking a hand full of pills. Patient tearful in triage.  Pt was resistant and guarded throughout the assessment. Pt refused to engage and acted irritated with being asked assessment questions. Pt avoided eye contact. It was noted that pt interacted with his attending nurse Nathan [name]) shortly prior to the psych team's arrival. Pt endorsed feeling suicidal with a plan before refusing to expound or respond further.  Chief Complaint:  Chief Complaint  Patient presents with   Suicidal   Visit Diagnosis:  Suicidal ideation   Severe recurrent major depression without psychotic features [facility])  Patient Reported Information How did you hear about [place]? Self  What Is the Reason for Your Visit/Call Today? Pt endorses SI with a plan, intention, and access to means.  How Long Has This Been Causing You Problems? -- (UTA)  What Do You Feel Would Help You the Most Today? Treatment for Depression or other mood problem   Have You Recently Had Any Thoughts About Hurting Yourself? Yes  Are You Planning to Commit Suicide/Harm Yourself At This time? Yes   Have you  Recently Had Thoughts About Hurting Someone Nathan [name]? No  Are You Planning to Harm Someone at This Time? No  Explanation: No data recorded  Have You Used Any Alcohol or Drugs in the Past 24 Hours? -- (UTA)  How Long Ago Did You Use Drugs or Alcohol? No data recorded What Did You Use and How Much? Cannabis   Do You Currently Have a Therapist/Psychiatrist? -- (UTA)  Name of Therapist/Psychiatrist: UTA   Have You Been Recently Discharged From Any Office Practice or Programs? No  Explanation of Discharge From Practice/Program: No data recorded   CCA Screening Triage Referral Assessment Type of Contact: Face-to-Face  Telemedicine Service Delivery:   Is this Initial or Reassessment?   Date Telepsych consult ordered in [facility]:    Time Telepsych consult ordered in [facility]:    Location of Assessment: [facility] ED  Provider Location: [facility] ED    Collateral Involvement: None provided   Does Patient Have a Court Appointed Legal Guardian? No data recorded Name and Contact of Legal Guardian: No data recorded If Minor and Not Living with Parent(s), Who has Custody? N/A  Is CPS involved or ever been involved? Never  Is APS involved or ever been involved? Never   Patient Determined To Be At Risk for Harm To Self or Others Based on Review of Patient Reported Information or Presenting Complaint? Yes, for Self-Harm  Method: No data recorded Availability of Means: No data recorded Intent: Clearly intends on inflicting harm that could cause death  Notification Required: No need or identified person  Additional Information for Danger to Others Potential: Previous attempts  Additional Comments  for Danger to Others Potential: No data recorded Are There Guns or Other Weapons in Your Home? Yes  Types of Guns/Weapons: UTA  Are These Weapons Safely Secured?                            No  Who Could Verify You Are Able To Have These Secured: No data recorded Do You Have any Outstanding Charges,  Pending Court Dates, Parole/Probation? No data recorded Contacted To Inform of Risk of Harm To Self or Others: No data recorded  Does Patient Present under Involuntary Commitment? No    [place] of Residence: [place]   Patient Currently Receiving the Following Services: Not Receiving Services   Determination of Need: Emergent (2 hours)   Options For Referral: Inpatient Hospitalization   Discharge Disposition:     Nathan [name], LCAS

## 2022-12-07 NOTE — ED Provider Notes (Signed)
Psychiatry has rounded on the patient and that they will be admitting to the psychiatric service.   Sharyn Creamer, MD 12/07/22 2128

## 2022-12-07 NOTE — ED Notes (Signed)
Pt states that he feels like his life is falling apart.  States that he stopped drinking alcohol a year ago and nothing has been getting better.  State that he does not crave alcohol and does not do illicit drugs besides marijuana. Admits to one drink 2 weeks ago and states that he didn't like it.  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  Pt states that he has an 47 year old son that he sees everyday but that he has missed several major holidays with him and that is upsetting to him.  States that son and ex girlfriend live with is mother and though he and the ex get along, his mother does not want him on the property so his ex brings son to him. Pt states HX of SI and that he tried to cut his arm off (scarring noted to left forearm) in the past.  States that he has not attempted to hurt himself since he stopped drinking approx 1 year ago.  Pt states that he thought about blowing his brains out last night but denies SI at this time and states that he does not want to leave his son without a father. Pt tearful at this time.

## 2022-12-07 NOTE — ED Provider Notes (Signed)
[facility] Provider Note    Event Date/Time   First MD Initiated Contact with Patient 12/07/22 1718     (approximate)   History   Suicidal   HPI  Nathan [name] is a 36 y.o. male with a history of schizoaffective disorder and bipolar disorder and polysubstance abuse.   Patient reports he has been sober from alcohol and all drug use except for marijuana for about 1 year.  He is continue to struggle though with depression, and reports that at times he will feel suicidal and last night he considered shooting himself with a gun.  He comes in today after he talked with his family about this and they brought him to the ER for evaluation.  He denies any recent illness.  No fevers no chills, he just recently got over a very mild sinus infection which has resolved.  Physical Exam   Triage Vital Signs: ED Triage Vitals  Enc Vitals Group     BP 12/07/22 1604 (!) 158/93     Pulse Rate 12/07/22 1604 91     Resp 12/07/22 1604 18     Temp 12/07/22 1604 98.4 F (36.9 C)     Temp Source 12/07/22 1604 Oral     SpO2 12/07/22 1604 97 %     Weight 12/07/22 1605 158 lb (71.7 kg)     Height 12/07/22 1605 5\' 9"  (1.753 m)     Head Circumference --      Peak Flow --      Pain Score 12/07/22 1605 0     Pain Loc --      Pain Edu? --      Excl. in GC? --     Most recent vital signs: Vitals:   12/07/22 1604  BP: (!) 158/93  Pulse: 91  Resp: 18  Temp: 98.4 F (36.9 C)  SpO2: 97%     General: Awake, no distress.  CV:  Good peripheral perfusion.  Normal heart rate and tone Resp:  Normal effort.  Clear Abd:  No distention.  Other:  Atraumatic hands and forearms except his left index finger, please see media uploaded, he reports about 3 to 4 days ago he was using a kitchen knife to cut something when it sliced across.  It bled briefly.  It is since healed over but it still feels sore to touch and achy.  He does report he had a tetanus shot in the last 10  years he has not any fevers or chills has not been red reports area is just slightly swollen.  He has good use of the left index finger and all fingers the left hand with normal sensation and motor movements.  Capillary refill in the nailbed appear normal   ED Results / Procedures / Treatments   Labs (all labs ordered are listed, but only abnormal results are displayed) Labs Reviewed  COMPREHENSIVE METABOLIC PANEL - Abnormal; Notable for the following components:      Result Value   Creatinine, Ser 0.57 (*)    AST 44 (*)    ALT 54 (*)    Anion gap 4 (*)    All other components within normal limits  ACETAMINOPHEN LEVEL - Abnormal; Notable for the following components:   Acetaminophen (Tylenol), Serum <10 (*)    All other components within normal limits  ETHANOL  CBC WITH DIFFERENTIAL/PLATELET  SALICYLATE LEVEL  URINE DRUG SCREEN, QUALITATIVE ([facility] ONLY)     EKG  Entered by me  at 1630 heart rate 80 QRS 90 QTc 400 Normal sinus rhythm, suspect element of LVH with repolarization abnormality.  No evidence of STEMI.  Patient has no clinical signs or symptoms of ACS including no chest pain no shortness of breath.   RADIOLOGY     PROCEDURES:  Critical Care performed: No  Procedures   MEDICATIONS ORDERED IN ED: Medications  ibuprofen (ADVIL) tablet 600 mg (has no administration in time range)  cephALEXin (KEFLEX) capsule 500 mg (has no administration in time range)     IMPRESSION / MDM / ASSESSMENT AND PLAN / ED COURSE  I reviewed the triage vital signs and the nursing notes.                              Differential diagnosis includes, but is not limited to, probable underlying depression ongoing report of substance abuse of marijuana only per patient, no alcohol use for over a year, reports he does not take any prescriptions except he has been taking BC powder for the discomfort in his left finger for the last few days.  I suspect his very mildly elevated salicylate  level is because of his use of BC powder.  He denies any overdose or ingestion.  He is alert well-oriented, and gives a reasonable history of what sounds like some sort of underlying depression or mental or psychiatric illness.  I have placed him under IVC out of precaution, and we will have our psychiatry team see and evaluate him.  Patient's presentation is most consistent with acute complicated illness / injury requiring diagnostic workup.   Labs reviewed normal CBC, normal metabolic panel with exception of very minimal transaminitis.  The patient has been placed in psychiatric observation due to the need to provide a safe environment for the patient while obtaining psychiatric consultation and evaluation, as well as ongoing medical and medication management to treat the patient's condition.  The patient has been placed under full IVC at this time.  Patient is pending psychiatry consult.      FINAL CLINICAL IMPRESSION(S) / ED DIAGNOSES   Final diagnoses:  Laceration of left index finger without damage to nail, foreign body presence unspecified, initial encounter  Suicidal ideation   Rx / DC Orders   ED Discharge Orders          Ordered    cephALEXin (KEFLEX) 500 MG capsule  2 times daily        12/07/22 1735             Note:  This document was prepared using Dragon voice recognition software and may include unintentional dictation errors.   Sharyn Creamer, MD 12/07/22 [id number]

## 2022-12-07 NOTE — ED Notes (Signed)
Pt dressed out in blue paper scrubs. Pt items placed in the belongings bag followed as blue vape, red lighter, phone, silver necklace, silver ring, brown belt, shoes, white socks, blue jeans, black tank top, 2 black coats, and black shorts.

## 2022-12-08 DIAGNOSIS — F3163 Bipolar disorder, current episode mixed, severe, without psychotic features: Secondary | ICD-10-CM | POA: Diagnosis not present

## 2022-12-08 MED ORDER — QUETIAPINE FUMARATE 100 MG PO TABS
100.0000 mg | ORAL_TABLET | Freq: Every day | ORAL | Status: DC
  Filled 2022-12-08 (×2): qty 1

## 2022-12-08 MED ORDER — DIPHENHYDRAMINE HCL 50 MG/ML IJ SOLN: 50.0000 mg | Freq: Four times a day (QID) | INTRAMUSCULAR | Status: DC | PRN

## 2022-12-08 MED ORDER — HALOPERIDOL LACTATE 5 MG/ML IJ SOLN: 5.0000 mg | Freq: Four times a day (QID) | INTRAMUSCULAR | Status: DC | PRN

## 2022-12-08 MED ORDER — LORAZEPAM 2 MG PO TABS
2.0000 mg | ORAL_TABLET | Freq: Four times a day (QID) | ORAL | Status: DC | PRN
  Administered 2022-12-08: 2 mg via ORAL
  Filled 2022-12-08: qty 1

## 2022-12-08 MED ORDER — DIPHENHYDRAMINE HCL 25 MG PO CAPS
50.0000 mg | ORAL_CAPSULE | Freq: Four times a day (QID) | ORAL | Status: DC | PRN
  Administered 2022-12-08: 50 mg via ORAL
  Filled 2022-12-08: qty 2

## 2022-12-08 MED ORDER — LORAZEPAM 2 MG/ML IJ SOLN: 2.0000 mg | Freq: Four times a day (QID) | INTRAMUSCULAR | Status: DC | PRN

## 2022-12-08 MED ORDER — HALOPERIDOL 5 MG PO TABS
5.0000 mg | ORAL_TABLET | Freq: Four times a day (QID) | ORAL | Status: DC | PRN
  Administered 2022-12-08: 5 mg via ORAL
  Filled 2022-12-08: qty 1

## 2022-12-08 NOTE — Group Note (Signed)
[facility] LCSW Group Therapy Note   Group Date: 12/08/2022 Start Time: 1300 End Time: 1400   Type of Therapy/Topic:  Group Therapy:  Emotion Regulation  Participation Level:  Did Not Attend   Mood:  Description of Group:    The purpose of this group is to assist patients in learning to regulate negative emotions and experience positive emotions. Patients will be guided to discuss ways in which they have been vulnerable to their negative emotions. These vulnerabilities will be juxtaposed with experiences of positive emotions or situations, and patients challenged to use positive emotions to combat negative ones. Special emphasis will be placed on coping with negative emotions in conflict situations, and patients will process healthy conflict resolution skills.  Therapeutic Goals: Patient will identify two positive emotions or experiences to reflect on in order to balance out negative emotions:  Patient will label two or more emotions that they find the most difficult to experience:  Patient will be able to demonstrate positive conflict resolution skills through discussion or role plays:   Summary of Patient Progress:   Patient did not attend group despite encouraged participation.      Therapeutic Modalities:   Cognitive Behavioral Therapy Feelings Identification Dialectical Behavioral Therapy   Nathan [name] W Nathan [name], LCSWA 

## 2022-12-08 NOTE — [facility] Suicide Risk Assessment (Signed)
[facility] Admission Suicide Risk Assessment   Nursing information obtained from:  Patient Demographic factors:  Male, Caucasian, Low socioeconomic status Current Mental Status:  Suicidal ideation indicated by patient (Pt verbally contracts for safety) Loss Factors:  Loss of significant relationship Historical Factors:  Impulsivity Risk Reduction Factors:  Positive therapeutic relationship  Total Time spent with patient: 45 minutes Principal Problem: Bipolar 1 disorder, mixed, severe (HCC) Diagnosis:  Principal Problem:   Bipolar 1 disorder, mixed, severe (HCC) Active Problems:   Suicidal ideation  Subjective Data: "Everything bad happens to me.  I will blow my brains out".  This is a 36 year old man well-known to the psychiatric service who came to the emergency room last night stating that he was feeling extremely bad and had suicidal thoughts.  Today he is repeating the suicidal thoughts and also making threats to harm other people including providers.  Patient will not discuss anything except to say that "everything" in his life is going wrong.  He denies that he has been drinking or using drugs.  Continued Clinical Symptoms:  Alcohol Use Disorder Identification Test Final Score (AUDIT): 0 The "Alcohol Use Disorders Identification Test", Guidelines for Use in Primary Care, Second Edition.  World Science writer [facility]). Score between 0-7:  no or low risk or alcohol related problems. Score between 8-15:  moderate risk of alcohol related problems. Score between 16-19:  high risk of alcohol related problems. Score 20 or above:  warrants further diagnostic evaluation for alcohol dependence and treatment.   CLINICAL FACTORS:   Bipolar Disorder:   Mixed State   Musculoskeletal: Strength & Muscle Tone: within normal limits Gait & Station: normal Patient leans: N/A  Psychiatric Specialty Exam:  Presentation  General Appearance:  Bizarre  Eye  Contact: None  Speech: Garbled  Speech Volume: Decreased  Handedness: Right   Mood and Affect  Mood: Angry; Irritable  Affect: Inappropriate   Thought Process  Thought Processes: Irrevelant  Descriptions of Associations:Circumstantial  Orientation:Full (Time, Place and Person)  Thought Content:Logical  History of Schizophrenia/Schizoaffective disorder:No  Duration of Psychotic Symptoms:No data recorded Hallucinations:Hallucinations: None  Ideas of Reference:None  Suicidal Thoughts:Suicidal Thoughts: Yes, Active SI Active Intent and/or Plan: With Intent; With Plan; With Means to Carry Out  Homicidal Thoughts:Homicidal Thoughts: No   Sensorium  Memory: Immediate Poor; Recent Poor; Remote Poor  Judgment: Poor  Insight: Poor   Executive Functions  Concentration: Poor  Attention Span: Poor  Recall: Poor  Fund of Knowledge: Poor  Language: Poor   Psychomotor Activity  Psychomotor Activity: Psychomotor Activity: Normal   Assets  Assets: Resilience   Sleep  Sleep: Sleep: Good Number of Hours of Sleep: 7    Physical Exam: Physical Exam Vitals and nursing note reviewed.  Constitutional:      Appearance: Normal appearance.  HENT:     Head: Normocephalic and atraumatic.     Mouth/Throat:     Pharynx: Oropharynx is clear.  Eyes:     Pupils: Pupils are equal, round, and reactive to light.  Cardiovascular:     Rate and Rhythm: Normal rate and regular rhythm.  Pulmonary:     Effort: Pulmonary effort is normal.     Breath sounds: Normal breath sounds.  Abdominal:     General: Abdomen is flat.     Palpations: Abdomen is soft.  Musculoskeletal:        General: Normal range of motion.  Skin:    General: Skin is warm and dry.  Neurological:     General:  No focal deficit present.     Mental Status: He is alert. Mental status is at baseline.  Psychiatric:        Attention and Perception: He is inattentive.        Mood and  Affect: Affect is labile, angry and inappropriate.        Speech: Speech is rapid and pressured and tangential.        Behavior: Behavior is agitated and aggressive.        Thought Content: Thought content includes homicidal and suicidal ideation.        Cognition and Memory: Cognition is impaired.        Judgment: Judgment is impulsive and inappropriate.    Review of Systems  Constitutional: Negative.   HENT: Negative.    Eyes: Negative.   Respiratory: Negative.    Cardiovascular: Negative.   Gastrointestinal: Negative.   Musculoskeletal: Negative.   Skin: Negative.   Neurological: Negative.   Psychiatric/Behavioral:  Positive for depression and suicidal ideas. Negative for hallucinations and substance abuse. The patient is nervous/anxious.    Blood pressure 110/70, pulse 68, temperature 98.4 F (36.9 C), temperature source Oral, resp. rate 18, height 5\' 9"  (1.753 m), weight 71.7 kg, SpO2 98 %. Body mass index is 23.34 kg/m.   COGNITIVE FEATURES THAT CONTRIBUTE TO RISK:  Thought constriction (tunnel vision)    SUICIDE RISK:   Mild:  Suicidal ideation of limited frequency, intensity, duration, and specificity.  There are no identifiable plans, no associated intent, mild dysphoria and related symptoms, good self-control (both objective and subjective assessment), few other risk factors, and identifiable protective factors, including available and accessible social support.  PLAN OF CARE: Continue 15-minute checks.  Initiate medication for appropriate conditions.  Engage in individual and group assessment and therapy daily.  Ongoing assessment of dangerousness prior to discharge.  I certify that inpatient services furnished can reasonably be expected to improve the patient's condition.   , MD 12/08/2022, 11:44 AM

## 2022-12-08 NOTE — BH IP Treatment Plan (Addendum)
Interdisciplinary Treatment and Diagnostic Plan Update  12/08/2022 Time of Session: 1000  Nathan [name] MRN: [id number]  Principal Diagnosis: MDD (major depressive disorder), recurrent episode, severe (HCC)  Secondary Diagnoses: Principal Problem:   MDD (major depressive disorder), recurrent episode, severe (HCC)   Current Medications:  Current Facility-Administered Medications  Medication Dose Route Frequency Provider Last Rate Last Admin   acetaminophen (TYLENOL) tablet 650 mg  650 mg Oral Q6H PRN Gillermo Murdoch, NP       albuterol (VENTOLIN HFA) 108 (90 Base) MCG/ACT inhaler 1 puff  1 puff Inhalation Q6H PRN Gillermo Murdoch, NP       alum & mag hydroxide-simeth (MAALOX/MYLANTA) 200-200-20 MG/5ML suspension 30 mL  30 mL Oral Q4H PRN Gillermo Murdoch, NP       cephALEXin (KEFLEX) capsule 500 mg  500 mg Oral BID Gillermo Murdoch, NP       ibuprofen (ADVIL) tablet 600 mg  600 mg Oral Q6H PRN Gillermo Murdoch, NP       magnesium hydroxide (MILK OF MAGNESIA) suspension 30 mL  30 mL Oral Daily PRN Gillermo Murdoch, NP       traZODone (DESYREL) tablet 100 mg  100 mg Oral QHS PRN Gillermo Murdoch, NP       PTA Medications: Medications Prior to Admission  Medication Sig Dispense Refill Last Dose   albuterol (VENTOLIN HFA) 108 (90 Base) MCG/ACT inhaler Inhale 1-2 puffs into the lungs every 6 (six) hours as needed for wheezing or shortness of breath.      cephALEXin (KEFLEX) 500 MG capsule Take 1 capsule (500 mg total) by mouth 2 (two) times daily. 14 capsule 0     Patient Stressors: Medication change or noncompliance   Substance abuse    Patient Strengths: Ability for insight  Motivation for treatment/growth   Treatment Modalities: Medication Management, Group therapy, Case management,  1 to 1 session with clinician, Psychoeducation, Recreational therapy.   Physician Treatment Plan for Primary Diagnosis: MDD (major depressive disorder), recurrent  episode, severe (HCC) Long Term Goal(s):     Short Term Goals:    Medication Management: Evaluate patient's response, side effects, and tolerance of medication regimen.  Therapeutic Interventions: 1 to 1 sessions, Unit Group sessions and Medication administration.  Evaluation of Outcomes: Progressing  Physician Treatment Plan for Secondary Diagnosis: Principal Problem:   MDD (major depressive disorder), recurrent episode, severe (HCC)  Long Term Goal(s):     Short Term Goals:       Medication Management: Evaluate patient's response, side effects, and tolerance of medication regimen.  Therapeutic Interventions: 1 to 1 sessions, Unit Group sessions and Medication administration.  Evaluation of Outcomes: Progressing   RN Treatment Plan for Primary Diagnosis: MDD (major depressive disorder), recurrent episode, severe (HCC) Long Term Goal(s): Knowledge of disease and therapeutic regimen to maintain health will improve  Short Term Goals: Ability to remain free from injury will improve, Ability to verbalize frustration and anger appropriately will improve, Ability to demonstrate self-control, Ability to participate in decision making will improve, Ability to verbalize feelings will improve, Ability to disclose and discuss suicidal ideas, Ability to identify and develop effective coping behaviors will improve, and Compliance with prescribed medications will improve  Medication Management: RN will administer medications as ordered by provider, will assess and evaluate patient's response and provide education to patient for prescribed medication. RN will report any adverse and/or side effects to prescribing provider.  Therapeutic Interventions: 1 on 1 counseling sessions, Psychoeducation, Medication administration, Evaluate responses to treatment, Monitor vital  signs and CBGs as ordered, Perform/monitor CIWA, COWS, AIMS and Fall Risk screenings as ordered, Perform wound care treatments as  ordered.  Evaluation of Outcomes: Progressing   LCSW Treatment Plan for Primary Diagnosis: MDD (major depressive disorder), recurrent episode, severe (HCC) Long Term Goal(s): Safe transition to appropriate next level of care at discharge, Engage patient in therapeutic group addressing interpersonal concerns.  Short Term Goals: Engage patient in aftercare planning with referrals and resources, Increase social support, Increase ability to appropriately verbalize feelings, Increase emotional regulation, Facilitate acceptance of mental health diagnosis and concerns, Facilitate patient progression through stages of change regarding substance use diagnoses and concerns, Identify triggers associated with mental health/substance abuse issues, and Increase skills for wellness and recovery  Therapeutic Interventions: Assess for all discharge needs, 1 to 1 time with Social worker, Explore available resources and support systems, Assess for adequacy in community support network, Educate family and significant other(s) on suicide prevention, Complete Psychosocial Assessment, Interpersonal group therapy.  Evaluation of Outcomes: Progressing   Progress in Treatment: Attending groups: No. Participating in groups: No. Taking medication as prescribed: Yes. Toleration medication: Yes. Family/Significant other contact made: No, will contact:  CSW will obtain consent to reach family friend.  Patient understands diagnosis: Yes. Discussing patient identified problems/goals with staff: Yes. Medical problems stabilized or resolved: Yes. Denies suicidal/homicidal ideation: No. Issues/concerns per patient self-inventory: Yes. Other: none  New problem(s) identified: No, Describe:  none  New Short Term/Long Term Goal(s): Patient to work towards detox, medication management for mood stabilization; elimination of SI thoughts; development of comprehensive mental wellness/sobriety plan.  Patient Goals:  Patient  refused to participate in treatment team meeting.   Discharge Plan or Barriers: Patient is homeless, CSW to discuss housing resources with patient prior to discharge.    Reason for Continuation of Hospitalization: Depression Medication stabilization Suicidal ideation  Estimated Length of Stay: 1-7 days   Last 3 [place] Suicide Severity Risk Score: Flowsheet Row Admission (Current) from 12/07/2022 in [facility] INPATIENT BEH[facility]AL MEDICINE Most recent reading at 12/07/2022 11:39 PM ED from 12/07/2022 in [facility] Most recent reading at 12/07/2022  4:05 PM ED from 07/21/2022 in [facility] EMERGENCY DEPARTMENT Most recent reading at 07/21/2022 10:55 PM  C-SSRS RISK CATEGORY Moderate Risk High Risk No Risk        Scribe for Treatment Team: [name], Theresia Majors 12/08/2022 10:00 AM

## 2022-12-08 NOTE — Progress Notes (Signed)
Recreation Therapy Notes  Date: 12/08/2022   Time: 10:20 am     Location: Craft room      Behavioral response: N/A   Intervention Topic: Wellness    Discussion/Intervention: Patient refused to attend group.    Clinical Observations/Feedback:  Patient refused to attend group.    Constance Whittle LRT/CTRS          Ladena Jacquez 12/08/2022 11:16 AM 

## 2022-12-08 NOTE — H&P (Signed)
Psychiatric Admission Assessment Adult  Patient Identification: Nathan [name] MRN:  [id number] Date of Evaluation:  12/08/2022 Chief Complaint:  MDD (major depressive disorder), recurrent episode, severe (HCC) [F33.2] Principal Diagnosis: Bipolar 1 disorder, mixed, severe (HCC) Diagnosis:  Principal Problem:   Bipolar 1 disorder, mixed, severe (HCC) Active Problems:   Suicidal ideation  History of Present Illness: Patient seen and chart reviewed.  36 year old man familiar to the psychiatric service.  Came to the emergency room last night apparently accompanied by family with reports that his mood has been extremely bad and he had been making suicidal statements.  Only partially participated in evaluation last night but it is documented that he said he would blow his brains out.  Patient was admitted to the psychiatric service.  I attempted to interview him today and he again was only partially cooperative.  Immediately started shouting and dominating the conversation.  Stated that "everything in my life is wrong".  Stated that "everything goes wrong for me".  When asked to be more specific said that he was losing his house.  I ask 1 follow-up question to that and he immediately lost his temper and began pacing around the room stating that he would leave immediately because we were not helping him.  Patient in my presence made threats to shoot himself as well as threats of violence towards his mother and towards providers.  He insists he has not been drinking alcohol and that he has not been using drugs.  Labs show no alcohol present but he has refused drug screen.  Not currently getting any outpatient treatment. Associated Signs/Symptoms: Depression Symptoms:  depressed mood, psychomotor agitation, hopelessness, suicidal thoughts with specific plan, (Hypo) Manic Symptoms:  Distractibility, Irritable Mood, Labiality of Mood, Anxiety Symptoms:  Excessive Worry, Psychotic Symptoms:   Denies  any PTSD Symptoms: Negative Total Time spent with patient: 45 minutes  Past Psychiatric History: Patient has a long history of similar presentations.  Long history of substance abuse.  Appears to have cut back or discontinued alcohol use.  Not clear if his drug use has changed.  Frequently had the sort of episodes in the context of alcohol or drug intoxication.  Has consistently refused to follow-up with recommended outpatient treatment.  He does have a history of self injury in the past.  Is the patient at risk to self? Yes.    Has the patient been a risk to self in the past 6 months? Yes.    Has the patient been a risk to self within the distant past? Yes.    Is the patient a risk to others? Yes.    Has the patient been a risk to others in the past 6 months? Yes.    Has the patient been a risk to others within the distant past? Yes.     [place] Scale:  Flowsheet Row Ad[facility] (Current) [facility] Most recent reading at 12/07/2022 11:39 PM ED [facility] Most recent reading at 12/07/2022  4:05 PM ED [facility] Most recent reading at 07/21/2022 10:55 PM  C-SSRS RISK CATEGORY Moderate Risk High Risk No Risk        Prior Inpatient Therapy: Yes.   If yes, describe multiple admissions with no follow-up Prior Outpatient Therapy: No. If yes, describe declines to follow up.  Only outpatient treatment was in the emergency room  Alcohol Screening: 1. How often do you  have a drink containing alcohol?: Never 2. How many drinks containing alcohol do you have on a typical day when you are drinking?: 1 or 2 3. How often do you have six or more drinks on one occasion?: Never AUDIT-C Score: 0 4. How often during the last year have you found that you were not able to stop drinking once you had started?: Never 5. How often during the last  year have you failed to do what was normally expected from you because of drinking?: Never 6. How often during the last year have you needed a first drink in the morning to get yourself going after a heavy drinking session?: Never 7. How often during the last year have you had a feeling of guilt of remorse after drinking?: Never 8. How often during the last year have you been unable to remember what happened the night before because you had been drinking?: Never 9. Have you or someone else been injured as a result of your drinking?: No 10. Has a relative or friend or a doctor or another health worker been concerned about your drinking or suggested you cut down?: No Alcohol Use Disorder Identification Test Final Score (AUDIT): 0 Substance Abuse History in the last 12 months:  Yes.   Consequences of Substance Abuse: Historically substance use of alcohol and amphetamines has been a major contributor.  Unclear what is going on now. Previous Psychotropic Medications: Yes  Psychological Evaluations: Yes  Past Medical History:  Past Medical History:  Diagnosis Date   Anxiety    Bipolar 1 disorder (HCC)    Chronic headache    Depression 03/30/2020   ETOH abuse 03/30/2020   Hep C w/o coma, chronic (HCC)    Heroin addiction (HCC)    Horseshoe kidney    Marijuana smoker 03/30/2020    Past Surgical History:  Procedure Laterality Date   TONSILLECTOMY     Family History:  Family History  Problem Relation Age of Onset   Hypertension Mother    Diabetes Mother    Hypertension Father    Heart failure Father    Diabetes Father    Family Psychiatric  History: Denies Tobacco Screening:  Social History   Tobacco Use  Smoking Status Every Day   Packs/day: 1.00   Types: Cigarettes  Smokeless Tobacco Current   Types: Chew    BH Tobacco Counseling     Are you interested in Tobacco Cessation Medications?  Yes, implement Nicotene Replacement Protocol Counseled patient on smoking cessation:   Yes Reason Tobacco Screening Not Completed: No value filed.       Social History:  Social History   Substance and Sexual Activity  Alcohol Use Not Currently   Alcohol/week: 24.0 standard drinks of alcohol   Types: 24 Cans of beer per week   Comment: 4 gallons a day- pt reports more than this currently     Social History   Substance and Sexual Activity  Drug Use Not Currently   Types: Marijuana, Cocaine, Benzodiazepines   Comment: Heroin addiction- denies current drug use    Additional Social History:                           Allergies:   Allergies  Allergen Reactions   Geodon [Ziprasidone Hcl] Other (See Comments)    Seizures   Valproic Acid    Levofloxacin Itching   Lab Results:  Results for orders placed or performed during the hospital encounter of  12/07/22 (from the past 48 hour(s))  Comprehensive metabolic panel     Status: Abnormal   Collection Time: 12/07/22  4:39 PM  Result Value Ref Range   Sodium 139 135 - 145 mmol/L   Potassium 4.1 3.5 - 5.1 mmol/L   Chloride 108 98 - 111 mmol/L   CO2 27 22 - 32 mmol/L   Glucose, Bld 83 70 - 99 mg/dL    Comment: Glucose reference range applies only to samples taken after fasting for at least 8 hours.   BUN 12 6 - 20 mg/dL   Creatinine, Ser 5.85 (L) 0.61 - 1.24 mg/dL   Calcium 8.9 8.9 - 27.7 mg/dL   Total Protein 7.2 6.5 - 8.1 g/dL   Albumin 3.7 3.5 - 5.0 g/dL   AST 44 (H) 15 - 41 U/L   ALT 54 (H) 0 - 44 U/L   Alkaline Phosphatase 70 38 - 126 U/L   Total Bilirubin 0.6 0.3 - 1.2 mg/dL   GFR, Estimated >82 >42 mL/min    Comment: (NOTE) Calculated using the CKD-EPI Creatinine Equation (2021)    Anion gap 4 (L) 5 - 15    Comment: Performed at [facility], [address] [place], [place] [address]  Ethanol     Status: None   Collection Time: 12/07/22  4:39 PM  Result Value Ref Range   Alcohol, Ethyl (B) <10 <10 mg/dL    Comment: (NOTE) Lowest detectable limit for serum alcohol is 10  mg/dL.  For medical purposes only. Performed at [facility], [address] [place], [place] [address]   CBC with Diff     Status: None   Collection Time: 12/07/22  4:39 PM  Result Value Ref Range   WBC 6.8 4.0 - 10.5 K/uL   RBC 4.76 4.22 - 5.81 MIL/uL   Hemoglobin 14.4 13.0 - 17.0 g/dL   HCT 40.0 86.7 - 61.9 %   MCV 95.4 80.0 - 100.0 fL   MCH 30.3 26.0 - 34.0 pg   MCHC 31.7 30.0 - 36.0 g/dL   RDW 50.9 32.6 - 71.2 %   Platelets 260 150 - 400 K/uL   nRBC 0.0 0.0 - 0.2 %   Neutrophils Relative % 41 %   Neutro Abs 2.8 1.7 - 7.7 K/uL   Lymphocytes Relative 45 %   Lymphs Abs 3.2 0.7 - 4.0 K/uL   Monocytes Relative 9 %   Monocytes Absolute 0.6 0.1 - 1.0 K/uL   Eosinophils Relative 4 %   Eosinophils Absolute 0.3 0.0 - 0.5 K/uL   Basophils Relative 1 %   Basophils Absolute 0.0 0.0 - 0.1 K/uL   Immature Granulocytes 0 %   Abs Immature Granulocytes 0.01 0.00 - 0.07 K/uL    Comment: Performed at [facility], [address] [place], [place] [address]  Salicylate level     Status: None   Collection Time: 12/07/22  4:39 PM  Result Value Ref Range   Salicylate Lvl 7.5 7.0 - 30.0 mg/dL    Comment: Performed at [facility], [address] [place], [place] [address]  Acetaminophen level     Status: Abnormal   Collection Time: 12/07/22  4:39 PM  Result Value Ref Range   Acetaminophen (Tylenol), Serum <10 (L) 10 - 30 ug/mL    Comment: (NOTE) Therapeutic concentrations vary significantly. A range of 10-30 ug/mL  may be an effective concentration for many patients. However, some  are best treated at concentrations outside of this  range. Acetaminophen concentrations >150 ug/mL at 4 hours after ingestion  and >50 ug/mL at 12 hours after ingestion are often associated with  toxic reactions.  Performed at [facility], [address] [place], [place] [address]   Resp Panel by RT-PCR (Flu A&B, Covid) Anterior Nasal Swab     Status: None    Collection Time: 12/07/22  9:01 PM   Specimen: Anterior Nasal Swab  Result Value Ref Range   SARS Coronavirus 2 by RT PCR NEGATIVE NEGATIVE    Comment: (NOTE) SARS-CoV-2 target nucleic acids are NOT DETECTED.  The SARS-CoV-2 RNA is generally detectable in upper respiratory specimens during the acute phase of infection. The lowest concentration of SARS-CoV-2 viral copies this assay can detect is 138 copies/mL. A negative result does not preclude SARS-Cov-2 infection and should not be used as the sole basis for treatment or other patient management decisions. A negative result may occur with  improper specimen collection/handling, submission of specimen other than nasopharyngeal swab, presence of viral mutation(s) within the areas targeted by this assay, and inadequate number of viral copies(<138 copies/mL). A negative result must be combined with clinical observations, patient history, and epidemiological information. The expected result is Negative.  Fact Sheet for Patients:  BloggerCourse.com  Fact Sheet for Healthcare Providers:  SeriousBroker.it  This test is no t yet approved or cleared by the [place] FDA and  has been authorized for detection and/or diagnosis of SARS-CoV-2 by FDA under an Emergency Use Authorization (EUA). This EUA will remain  in effect (meaning this test can be used) for the duration of the COVID-19 declaration under Section 564(b)(1) of the Act, 21 U.S.C.section 360bbb-3(b)(1), unless the authorization is terminated  or revoked sooner.       Influenza A by PCR NEGATIVE NEGATIVE   Influenza B by PCR NEGATIVE NEGATIVE    Comment: (NOTE) The Xpert Xpress SARS-CoV-2/FLU/RSV plus assay is intended as an aid in the diagnosis of influenza from Nasopharyngeal swab specimens and should not be used as a sole basis for treatment. Nasal washings and aspirates are unacceptable for Xpert Xpress  SARS-CoV-2/FLU/RSV testing.  Fact Sheet for Patients: BloggerCourse.com  Fact Sheet for Healthcare Providers: SeriousBroker.it  This test is not yet approved or cleared by the [place] FDA and has been authorized for detection and/or diagnosis of SARS-CoV-2 by FDA under an Emergency Use Authorization (EUA). This EUA will remain in effect (meaning this test can be used) for the duration of the COVID-19 declaration under Section 564(b)(1) of the Act, 21 U.S.C. section 360bbb-3(b)(1), unless the authorization is terminated or revoked.  Performed at [facility], [address] [place], [place] [address]     Blood Alcohol level:  Lab Results  Component Value Date   [facility] <10 12/07/2022   ETH <10 07/21/2022    Metabolic Disorder Labs:  Lab Results  Component Value Date   HGBA1C 5.2 02/06/2021   MPG 103 02/06/2021   Lab Results  Component Value Date   PROLACTIN 7.0 04/23/2016   Lab Results  Component Value Date   CHOL 139 02/06/2021   TRIG 55 02/06/2021   HDL 55 02/06/2021   CHOLHDL 2.5 02/06/2021   VLDL 11 02/06/2021   LDLCALC 73 02/06/2021   LDLCALC 105 (H) 04/23/2016    Current Medications: Current Facility-Administered Medications  Medication Dose Route Frequency Provider Last Rate Last Admin   acetaminophen (TYLENOL) tablet 650 mg  650 mg Oral Q6H PRN Gillermo Murdoch, NP  albuterol (VENTOLIN HFA) 108 (90 Base) MCG/ACT inhaler 1 puff  1 puff Inhalation Q6H PRN Gillermo Murdoch, NP       alum & mag hydroxide-simeth (MAALOX/MYLANTA) 200-200-20 MG/5ML suspension 30 mL  30 mL Oral Q4H PRN Gillermo Murdoch, NP       cephALEXin (KEFLEX) capsule 500 mg  500 mg Oral BID Gillermo Murdoch, NP   500 mg at 12/08/22 1610   diphenhydrAMINE (BENADRYL) capsule 50 mg  50 mg Oral Q6H PRN Cicilia Clinger, Jackquline Denmark, MD   50 mg at 12/08/22 1143   Or   diphenhydrAMINE (BENADRYL) injection 50 mg  50 mg  Intramuscular Q6H PRN Khaleed Holan, Jackquline Denmark, MD       haloperidol (HALDOL) tablet 5 mg  5 mg Oral Q6H PRN Kenneth Cuaresma, Jackquline Denmark, MD   5 mg at 12/08/22 1143   Or   haloperidol lactate (HALDOL) injection 5 mg  5 mg Intramuscular Q6H PRN Caytlin Better, Jackquline Denmark, MD       ibuprofen (ADVIL) tablet 600 mg  600 mg Oral Q6H PRN Gillermo Murdoch, NP       LORazepam (ATIVAN) tablet 2 mg  2 mg Oral Q6H PRN Kyshon Tolliver, Jackquline Denmark, MD   2 mg at 12/08/22 1143   Or   LORazepam (ATIVAN) injection 2 mg  2 mg Intramuscular Q6H PRN Senta Kantor, Jackquline Denmark, MD       magnesium hydroxide (MILK OF MAGNESIA) suspension 30 mL  30 mL Oral Daily PRN Gillermo Murdoch, NP       traZODone (DESYREL) tablet 100 mg  100 mg Oral QHS PRN Gillermo Murdoch, NP       PTA Medications: Medications Prior to Admission  Medication Sig Dispense Refill Last Dose   albuterol (VENTOLIN HFA) 108 (90 Base) MCG/ACT inhaler Inhale 1-2 puffs into the lungs every 6 (six) hours as needed for wheezing or shortness of breath.      cephALEXin (KEFLEX) 500 MG capsule Take 1 capsule (500 mg total) by mouth 2 (two) times daily. 14 capsule 0     Musculoskeletal: Strength & Muscle Tone: within normal limits Gait & Station: normal Patient leans: N/A            Psychiatric Specialty Exam:  Presentation  General Appearance:  Bizarre  Eye Contact: None  Speech: Garbled  Speech Volume: Decreased  Handedness: Right   Mood and Affect  Mood: Angry; Irritable  Affect: Inappropriate   Thought Process  Thought Processes: Irrevelant  Duration of Psychotic Symptoms: Intermittent for years Past Diagnosis of Schizophrenia or Psychoactive disorder: No  Descriptions of Associations:Circumstantial  Orientation:Full (Time, Place and Person)  Thought Content:Logical  Hallucinations:Hallucinations: None  Ideas of Reference:None  Suicidal Thoughts:Suicidal Thoughts: Yes, Active SI Active Intent and/or Plan: With Intent; With Plan; With Means to  Carry Out  Homicidal Thoughts:Homicidal Thoughts: No   Sensorium  Memory: Immediate Poor; Recent Poor; Remote Poor  Judgment: Poor  Insight: Poor   Executive Functions  Concentration: Poor  Attention Span: Poor  Recall: Poor  Fund of Knowledge: Poor  Language: Poor   Psychomotor Activity  Psychomotor Activity: Psychomotor Activity: Normal   Assets  Assets: Resilience   Sleep  Sleep: Sleep: Good Number of Hours of Sleep: 7    Physical Exam: Physical Exam Vitals and nursing note reviewed.  Constitutional:      Appearance: Normal appearance.  HENT:     Head: Normocephalic and atraumatic.     Mouth/Throat:     Pharynx: Oropharynx is clear.  Eyes:  Pupils: Pupils are equal, round, and reactive to light.  Cardiovascular:     Rate and Rhythm: Normal rate and regular rhythm.  Pulmonary:     Effort: Pulmonary effort is normal.     Breath sounds: Normal breath sounds.  Abdominal:     General: Abdomen is flat.     Palpations: Abdomen is soft.  Musculoskeletal:        General: Normal range of motion.  Skin:    General: Skin is warm and dry.  Neurological:     General: No focal deficit present.     Mental Status: He is alert. Mental status is at baseline.  Psychiatric:        Attention and Perception: He is inattentive.        Mood and Affect: Affect is labile, angry and inappropriate.        Speech: Speech is rapid and pressured and tangential.        Behavior: Behavior is agitated and aggressive.        Thought Content: Thought content includes homicidal and suicidal ideation.        Cognition and Memory: Cognition is impaired.        Judgment: Judgment is inappropriate.    Review of Systems  Constitutional: Negative.   HENT: Negative.    Eyes: Negative.   Respiratory: Negative.    Cardiovascular: Negative.   Gastrointestinal: Negative.   Musculoskeletal: Negative.   Skin: Negative.   Neurological: Negative.    Psychiatric/Behavioral:  Positive for depression and suicidal ideas. Negative for hallucinations. The patient is nervous/anxious.    Blood pressure 110/70, pulse 68, temperature 98.4 F (36.9 C), temperature source Oral, resp. rate 18, height 5\' 9"  (1.753 m), weight 71.7 kg, SpO2 98 %. Body mass index is 23.34 kg/m.  Treatment Plan Summary: Medication management and Plan orders placed at this time for as needed Ativan Haldol and Benadryl for his acute agitation.  Staff working to calm him down.  Patient will be started on medicine for bipolar depression.  Try to reassess once he is less angry.  Continue 15-minute checks.  Try to engage in individual and group therapy regularly.  Ongoing assessment of dangerousness prior to discharge.  Multiple attempts will be made to get him to agree to outpatient follow-up.  Observation Level/Precautions:  15 minute checks  Laboratory:  Chemistry Profile  Psychotherapy:    Medications:    Consultations:    Discharge Concerns:    Estimated LOS:  Other:     Physician Treatment Plan for Primary Diagnosis: Bipolar 1 disorder, mixed, severe (HCC) Long Term Goal(s): Improvement in symptoms so as ready for discharge  Short Term Goals: Ability to verbalize feelings will improve and Ability to disclose and discuss suicidal ideas  Physician Treatment Plan for Secondary Diagnosis: Principal Problem:   Bipolar 1 disorder, mixed, severe (HCC) Active Problems:   Suicidal ideation  Long Term Goal(s): Improvement in symptoms so as ready for discharge  Short Term Goals: Compliance with prescribed medications will improve  I certify that inpatient services furnished can reasonably be expected to improve the patient's condition.    , MD 12/20/202311:47 AM

## 2022-12-08 NOTE — Plan of Care (Signed)
Patient refused to get up this morning for breakfast or treatment team but took his medicine.  Patient got agitated and threatening to get discharge. Then patient calm down and asked for medicine. Patient took Ativan,Benadryl and Haldol in one attempt. Patient states " I asked you for Ativan but you gave me everything to knock me out. I lost my trust in you." Patient did not get up for lunch or dinner. Patient monitoring 15 minutes for safety.

## 2022-12-08 NOTE — [facility] Counselor (Signed)
CSW went to meet with pt but he was sleeping soundly. Due to pt agitation throughout the morning and being verbally aggressive with staff who attempt to ask him questions, CSW will not wake sleeping pt and will attempt assessment at a later date when pt has had some time to acclimate.   Vilma Meckel. Algis Greenhouse, MSW, LCSW, LCAS 12/08/2022 4:01 PM

## 2022-12-08 NOTE — Progress Notes (Signed)

## 2022-12-09 DIAGNOSIS — F3163 Bipolar disorder, current episode mixed, severe, without psychotic features: Secondary | ICD-10-CM | POA: Diagnosis not present

## 2022-12-09 NOTE — Plan of Care (Signed)
?  Problem: Coping: ?Goal: Level of anxiety will decrease ?Outcome: Progressing ?  ?Problem: Safety: ?Goal: Ability to remain free from injury will improve ?Outcome: Progressing ?  ?

## 2022-12-09 NOTE — Progress Notes (Addendum)
Patient appears irritable this morning. Patient observed becoming upset with [facility] due to stating he did not eat yesterday and came up to [facility] demanding food and raising his voice. Patient de-escalated by staff and redirected. Patient stated the medication "knocked me out" all day yesterday and reported feeling hungry with dry mouth. Patient provided with breakfast and drink. Patient eventually calmed down and was med compliant with morning medication. Patient denies SI,HI, and A/V/H with no plan/intent and has, since then, been cooperative thus far this shift. No s/s of current distress.    12/09/22 0850  Psych Admission Type (Psych Patients Only)  Admission Status Involuntary  Psychosocial Assessment  Patient Complaints Irritability  Eye Contact Fair  Facial Expression Flat  Affect Labile  Speech Logical/coherent  Interaction Assertive;Demanding  Motor Activity Fidgety  Appearance/Hygiene Unremarkable  Behavior Characteristics Cooperative  Mood Labile  Thought Process  Coherency WDL  Content Blaming others  Delusions None reported or observed  Perception WDL  Hallucination None reported or observed  Judgment Poor  Confusion None  Danger to Self  Current suicidal ideation? Denies  Self-Injurious Behavior No self-injurious ideation or behavior indicators observed or expressed   Agreement Not to Harm Self Yes  Description of Agreement verbally contracts for safety  Danger to Others  Danger to Others None reported or observed

## 2022-12-09 NOTE — Group Note (Signed)

## 2022-12-09 NOTE — Progress Notes (Signed)
[facility] MD Progress Note  12/09/2022 12:52 PM Nathan [name]  MRN:  [id number] Subjective: Follow-up 36 year old man with a history of bipolar disorder and substance use.  Once again patient is angry today although not quite as dramatically as yesterday.  He was up this morning cursing at staff and when I went to see him later in the morning he once again jumped out of bed and was cursing at me stomping around the room.  He was very insistent that I call his ex-girlfriend Nathan [name] at [phone].  I did telephone her.  She had no more information to share simply saying that she had dropped him off at the hospital but did not know any specific information about what had been bothering him.  Patient continues to talk about being severely depressed and suicidal but without any specific threats in the hospital. Principal Problem: Bipolar 1 disorder, mixed, severe (HCC) Diagnosis: Principal Problem:   Bipolar 1 disorder, mixed, severe (HCC) Active Problems:   Suicidal ideation  Total Time spent with patient: 30 minutes  Past Psychiatric History: Past history of recurrent episodes of explosive mood usually associated with substance abuse  Past Medical History:  Past Medical History:  Diagnosis Date   Anxiety    Bipolar 1 disorder (HCC)    Chronic headache    Depression 03/30/2020   ETOH abuse 03/30/2020   Hep C w/o coma, chronic (HCC)    Heroin addiction (HCC)    Horseshoe kidney    Marijuana smoker 03/30/2020    Past Surgical History:  Procedure Laterality Date   TONSILLECTOMY     Family History:  Family History  Problem Relation Age of Onset   Hypertension Mother    Diabetes Mother    Hypertension Father    Heart failure Father    Diabetes Father    Family Psychiatric  History: See previous Social History:  Social History   Substance and Sexual Activity  Alcohol Use Not Currently   Alcohol/week: 24.0 standard drinks of alcohol   Types: 24 Cans of beer per week    Comment: 4 gallons a day- pt reports more than this currently     Social History   Substance and Sexual Activity  Drug Use Not Currently   Types: Marijuana, Cocaine, Benzodiazepines   Comment: Heroin addiction- denies current drug use    Social History   Socioeconomic History   Marital status: Single    Spouse name: Not on file   Number of children: 1   Years of education: 11   Highest education level: 11th grade  Occupational History   Not on file  Tobacco Use   Smoking status: Every Day    Packs/day: 1.00    Types: Cigarettes   Smokeless tobacco: Current    Types: Chew  Vaping Use   Vaping Use: Never used  Substance and Sexual Activity   Alcohol use: Not Currently    Alcohol/week: 24.0 standard drinks of alcohol    Types: 24 Cans of beer per week    Comment: 4 gallons a day- pt reports more than this currently   Drug use: Not Currently    Types: Marijuana, Cocaine, Benzodiazepines    Comment: Heroin addiction- denies current drug use   Sexual activity: Not Currently  Other Topics Concern   Not on file  Social History Narrative   Not on file   Social Determinants of Health   Financial Resource Strain: Not on file  Food Insecurity: Unknown (12/07/2022)   Hunger  Vital Sign    Worried About Programme researcher, broadcasting/film/video in the Last Year: Patient refused    Ran Out of Food in the Last Year: Patient refused  Transportation Needs: Unknown (12/07/2022)   PRAPARE - Administrator, Civil Service (Medical): Patient refused    Lack of Transportation (Non-Medical): Patient refused  Physical Activity: Not on file  Stress: Not on file  Social Connections: Not on file   Additional Social History:                         Sleep: Fair  Appetite:  Fair  Current Medications: Current Facility-Administered Medications  Medication Dose Route Frequency Provider Last Rate Last Admin   acetaminophen (TYLENOL) tablet 650 mg  650 mg Oral Q6H PRN Gillermo Murdoch,  NP       albuterol (VENTOLIN HFA) 108 (90 Base) MCG/ACT inhaler 1 puff  1 puff Inhalation Q6H PRN Gillermo Murdoch, NP       alum & mag hydroxide-simeth (MAALOX/MYLANTA) 200-200-20 MG/5ML suspension 30 mL  30 mL Oral Q4H PRN Gillermo Murdoch, NP       cephALEXin (KEFLEX) capsule 500 mg  500 mg Oral BID Gillermo Murdoch, NP   500 mg at 12/09/22 0848   diphenhydrAMINE (BENADRYL) capsule 50 mg  50 mg Oral Q6H PRN Katricia Prehn, Jackquline Denmark, MD   50 mg at 12/08/22 1143   Or   diphenhydrAMINE (BENADRYL) injection 50 mg  50 mg Intramuscular Q6H PRN [place], Jackquline Denmark, MD       haloperidol (HALDOL) tablet 5 mg  5 mg Oral Q6H PRN Demarri Elie, Jackquline Denmark, MD   5 mg at 12/08/22 1143   Or   haloperidol lactate (HALDOL) injection 5 mg  5 mg Intramuscular Q6H PRN Burke Terry, Jackquline Denmark, MD       ibuprofen (ADVIL) tablet 600 mg  600 mg Oral Q6H PRN Gillermo Murdoch, NP       LORazepam (ATIVAN) tablet 2 mg  2 mg Oral Q6H PRN Zephyr Ridley, Jackquline Denmark, MD   2 mg at 12/08/22 1143   Or   LORazepam (ATIVAN) injection 2 mg  2 mg Intramuscular Q6H PRN Marcellas Marchant, Jackquline Denmark, MD       magnesium hydroxide (MILK OF MAGNESIA) suspension 30 mL  30 mL Oral Daily PRN Gillermo Murdoch, NP       QUEtiapine (SEROQUEL) tablet 100 mg  100 mg Oral QHS Zenaya Ulatowski, Jackquline Denmark, MD       traZODone (DESYREL) tablet 100 mg  100 mg Oral QHS PRN Gillermo Murdoch, NP        Lab Results:  Results for orders placed or performed during the hospital encounter of 12/07/22 (from the past 48 hour(s))  Comprehensive metabolic panel     Status: Abnormal   Collection Time: 12/07/22  4:39 PM  Result Value Ref Range   Sodium 139 135 - 145 mmol/L   Potassium 4.1 3.5 - 5.1 mmol/L   Chloride 108 98 - 111 mmol/L   CO2 27 22 - 32 mmol/L   Glucose, Bld 83 70 - 99 mg/dL    Comment: Glucose reference range applies only to samples taken after fasting for at least 8 hours.   BUN 12 6 - 20 mg/dL   Creatinine, Ser 0.32 (L) 0.61 - 1.24 mg/dL   Calcium 8.9 8.9 - 12.2 mg/dL    Total Protein 7.2 6.5 - 8.1 g/dL   Albumin 3.7 3.5 - 5.0 g/dL   AST  44 (H) 15 - 41 U/L   ALT 54 (H) 0 - 44 U/L   Alkaline Phosphatase 70 38 - 126 U/L   Total Bilirubin 0.6 0.3 - 1.2 mg/dL   GFR, Estimated >82 >42 mL/min    Comment: (NOTE) Calculated using the CKD-EPI Creatinine Equation (2021)    Anion gap 4 (L) 5 - 15    Comment: Performed at [facility], [address], [place], [place] [address]  Ethanol     Status: None   Collection Time: 12/07/22  4:39 PM  Result Value Ref Range   Alcohol, Ethyl (B) <10 <10 mg/dL    Comment: (NOTE) Lowest detectable limit for serum alcohol is 10 mg/dL.  For medical purposes only. Performed at [facility], [address] [place], [place] [address]   CBC with Diff     Status: None   Collection Time: 12/07/22  4:39 PM  Result Value Ref Range   WBC 6.8 4.0 - 10.5 K/uL   RBC 4.76 4.22 - 5.81 MIL/uL   Hemoglobin 14.4 13.0 - 17.0 g/dL   HCT 40.0 86.7 - 61.9 %   MCV 95.4 80.0 - 100.0 fL   MCH 30.3 26.0 - 34.0 pg   MCHC 31.7 30.0 - 36.0 g/dL   RDW 50.9 32.6 - 71.2 %   Platelets 260 150 - 400 K/uL   nRBC 0.0 0.0 - 0.2 %   Neutrophils Relative % 41 %   Neutro Abs 2.8 1.7 - 7.7 K/uL   Lymphocytes Relative 45 %   Lymphs Abs 3.2 0.7 - 4.0 K/uL   Monocytes Relative 9 %   Monocytes Absolute 0.6 0.1 - 1.0 K/uL   Eosinophils Relative 4 %   Eosinophils Absolute 0.3 0.0 - 0.5 K/uL   Basophils Relative 1 %   Basophils Absolute 0.0 0.0 - 0.1 K/uL   Immature Granulocytes 0 %   Abs Immature Granulocytes 0.01 0.00 - 0.07 K/uL    Comment: Performed at [facility], [address] [place], [place] [address]  Salicylate level     Status: None   Collection Time: 12/07/22  4:39 PM  Result Value Ref Range   Salicylate Lvl 7.5 7.0 - 30.0 mg/dL    Comment: Performed at [facility], [address] [place], [place] [address]  Acetaminophen level     Status: Abnormal   Collection Time: 12/07/22  4:39 PM  Result  Value Ref Range   Acetaminophen (Tylenol), Serum <10 (L) 10 - 30 ug/mL    Comment: (NOTE) Therapeutic concentrations vary significantly. A range of 10-30 ug/mL  may be an effective concentration for many patients. However, some  are best treated at concentrations outside of this range. Acetaminophen concentrations >150 ug/mL at 4 hours after ingestion  and >50 ug/mL at 12 hours after ingestion are often associated with  toxic reactions.  Performed at [facility], [address] [place], [place] [address]   Resp Panel by RT-PCR (Flu A&B, Covid) Anterior Nasal Swab     Status: None   Collection Time: 12/07/22  9:01 PM   Specimen: Anterior Nasal Swab  Result Value Ref Range   SARS Coronavirus 2 by RT PCR NEGATIVE NEGATIVE    Comment: (NOTE) SARS-CoV-2 target nucleic acids are NOT DETECTED.  The SARS-CoV-2 RNA is generally detectable in upper respiratory specimens during the acute phase of infection. The lowest concentration of SARS-CoV-2 viral copies this assay can detect is 138 copies/mL. A negative result does not preclude  SARS-Cov-2 infection and should not be used as the sole basis for treatment or other patient management decisions. A negative result may occur with  improper specimen collection/handling, submission of specimen other than nasopharyngeal swab, presence of viral mutation(s) within the areas targeted by this assay, and inadequate number of viral copies(<138 copies/mL). A negative result must be combined with clinical observations, patient history, and epidemiological information. The expected result is Negative.  Fact Sheet for Patients:  BloggerCourse.com  Fact Sheet for Healthcare Providers:  SeriousBroker.it  This test is no t yet approved or cleared by the [place] FDA and  has been authorized for detection and/or diagnosis of SARS-CoV-2 by FDA under an Emergency Use Authorization (EUA).  This EUA will remain  in effect (meaning this test can be used) for the duration of the COVID-19 declaration under Section 564(b)(1) of the Act, 21 U.S.C.section 360bbb-3(b)(1), unless the authorization is terminated  or revoked sooner.       Influenza A by PCR NEGATIVE NEGATIVE   Influenza B by PCR NEGATIVE NEGATIVE    Comment: (NOTE) The Xpert Xpress SARS-CoV-2/FLU/RSV plus assay is intended as an aid in the diagnosis of influenza from Nasopharyngeal swab specimens and should not be used as a sole basis for treatment. Nasal washings and aspirates are unacceptable for Xpert Xpress SARS-CoV-2/FLU/RSV testing.  Fact Sheet for Patients: BloggerCourse.com  Fact Sheet for Healthcare Providers: SeriousBroker.it  This test is not yet approved or cleared by the [place] FDA and has been authorized for detection and/or diagnosis of SARS-CoV-2 by FDA under an Emergency Use Authorization (EUA). This EUA will remain in effect (meaning this test can be used) for the duration of the COVID-19 declaration under Section 564(b)(1) of the Act, 21 U.S.C. section 360bbb-3(b)(1), unless the authorization is terminated or revoked.  Performed at [facility], [address] [place], [place] [address]     Blood Alcohol level:  Lab Results  Component Value Date   [facility] <10 12/07/2022   ETH <10 07/21/2022    Metabolic Disorder Labs: Lab Results  Component Value Date   HGBA1C 5.2 02/06/2021   MPG 103 02/06/2021   Lab Results  Component Value Date   PROLACTIN 7.0 04/23/2016   Lab Results  Component Value Date   CHOL 139 02/06/2021   TRIG 55 02/06/2021   HDL 55 02/06/2021   CHOLHDL 2.5 02/06/2021   VLDL 11 02/06/2021   LDLCALC 73 02/06/2021   LDLCALC 105 (H) 04/23/2016    Physical Findings: AIMS:  , ,  ,  ,    CIWA:    COWS:     Musculoskeletal: Strength & Muscle Tone: within normal limits Gait & Station:  normal Patient leans: N/A  Psychiatric Specialty Exam:  Presentation  General Appearance:  Bizarre  Eye Contact: None  Speech: Garbled  Speech Volume: Decreased  Handedness: Right   Mood and Affect  Mood: Angry; Irritable  Affect: Inappropriate   Thought Process  Thought Processes: Irrevelant  Descriptions of Associations:Circumstantial  Orientation:Full (Time, Place and Person)  Thought Content:Logical  History of Schizophrenia/Schizoaffective disorder:No  Duration of Psychotic Symptoms:No data recorded Hallucinations:No data recorded Ideas of Reference:None  Suicidal Thoughts:No data recorded Homicidal Thoughts:No data recorded  Sensorium  Memory: Immediate Poor; Recent Poor; Remote Poor  Judgment: Poor  Insight: Poor   Executive Functions  Concentration: Poor  Attention Span: Poor  Recall: Poor  Fund of Knowledge: Poor  Language: Poor   Psychomotor Activity  Psychomotor Activity:No data recorded  Assets  Assets: Resilience   Sleep  Sleep:No data recorded   Physical Exam: Physical Exam Vitals and nursing note reviewed.  Constitutional:      Appearance: Normal appearance.  HENT:     Head: Normocephalic and atraumatic.     Mouth/Throat:     Pharynx: Oropharynx is clear.  Eyes:     Pupils: Pupils are equal, round, and reactive to light.  Cardiovascular:     Rate and Rhythm: Normal rate and regular rhythm.  Pulmonary:     Effort: Pulmonary effort is normal.     Breath sounds: Normal breath sounds.  Abdominal:     General: Abdomen is flat.     Palpations: Abdomen is soft.  Musculoskeletal:        General: Normal range of motion.  Skin:    General: Skin is warm and dry.  Neurological:     General: No focal deficit present.     Mental Status: He is alert. Mental status is at baseline.  Psychiatric:        Attention and Perception: He is inattentive.        Mood and Affect: Affect is angry and  inappropriate.        Speech: Speech is rapid and pressured and tangential.        Behavior: Behavior is agitated and aggressive.        Thought Content: Thought content is paranoid.    Review of Systems  Constitutional: Negative.   HENT: Negative.    Eyes: Negative.   Respiratory: Negative.    Cardiovascular: Negative.   Gastrointestinal: Negative.   Musculoskeletal: Negative.   Skin: Negative.   Neurological: Negative.   Psychiatric/Behavioral:  Positive for depression.    Blood pressure 110/70, pulse 68, temperature 98.4 F (36.9 C), temperature source Oral, resp. rate 18, height 5\' 9"  (1.753 m), weight 71.7 kg, SpO2 98 %. Body mass index is 23.34 kg/m.   Treatment Plan Summary: Medication management and Plan at his insistence I did speak to his ex-girlfriend today.  She said that he was welcome to telephone her himself.  No change to medicine for today.  Seems to be gradually calming down.  Continue to encourage him to try to be out of his bed and communicate with staff.  Mordecai RasmussenJohn Daquisha Clermont, MD 12/09/2022, 12:52 PM

## 2022-12-09 NOTE — Progress Notes (Signed)
[facility] LCSW Progress Note   12/09/2022    3:04 PM  [facility]   [id number]   Type of Contact and Topic:  PSA Attempt 2  CSW attempted to complete PSA with patient. Patient appeared to be asleep and CSW was unable to wake patient. Shortly before and after CSW attempted PSA patient was observed talking in his room. CSW team will make final attempt to complete PSA with patient on 12/10/2022 before completing assessment via chart review.    Signed:  [name], MSW, LCSWA, LCAS 12/09/2022 3:04 PM

## 2022-12-09 NOTE — Progress Notes (Signed)
Recreation Therapy Notes   Date: 12/09/2022  Time: 9:15 am    Location: Craft room     Behavioral response: N/A   Intervention Topic: Time Management   Discussion/Intervention: Patient refused to attend group.   Clinical Observations/Feedback:  Patient refused to attend group.    Chizuko Trine LRT/CTRS         Leon Montoya 12/09/2022 12:08 PM

## 2022-12-09 NOTE — Progress Notes (Addendum)
Knocked on patient's door to let him know his antibiotic is due and patient became highly irritable stating "If you are going to be just like everyone else telling me what to do then fuck off." "Get the fuck out of my room." Patient then kept yelling curse words despite nobody being in his room. Patient non-compliant with 5pm antibiotic.

## 2022-12-10 ENCOUNTER — Other Ambulatory Visit: Payer: Self-pay

## 2022-12-10 DIAGNOSIS — F3163 Bipolar disorder, current episode mixed, severe, without psychotic features: Secondary | ICD-10-CM | POA: Diagnosis not present

## 2022-12-10 LAB — HEMOGLOBIN A1C
Hgb A1c MFr Bld: 5.5 % (ref 4.8–5.6)
Mean Plasma Glucose: 111 mg/dL

## 2022-12-10 MED ORDER — NICOTINE 21 MG/24HR TD PT24: 21.0000 mg | MEDICATED_PATCH | Freq: Every day | TRANSDERMAL | Status: DC

## 2022-12-10 MED ORDER — NICOTINE POLACRILEX 2 MG MT GUM
2.0000 mg | CHEWING_GUM | OROMUCOSAL | Status: DC | PRN
  Administered 2022-12-10 (×2): 2 mg via ORAL
  Filled 2022-12-10 (×2): qty 1

## 2022-12-10 MED ORDER — NICOTINE 21 MG/24HR TD PT24: 21.0000 mg | MEDICATED_PATCH | Freq: Every day | TRANSDERMAL | 0 refills | Status: DC

## 2022-12-10 MED ORDER — CEPHALEXIN 500 MG PO CAPS
500.0000 mg | ORAL_CAPSULE | Freq: Two times a day (BID) | ORAL | 0 refills | Status: DC
  Filled 2022-12-10: qty 14, 7d supply, fill #0

## 2022-12-10 NOTE — Discharge Summary (Signed)
Physician Discharge Summary Note  Patient:  Nathan [name] is an 36 y.o., male MRN:  [id number] DOB:  August 07, 1986 Patient phone:  [phone] (home)  Patient address:   [address] [place] [address],  Total Time spent with patient: 30 minutes  Date of Admission:  12/07/2022 Date of Discharge: 12/10/2022  Reason for Admission: Admitted after presenting to the emergency room agitated and angry reporting suicidal and homicidal thoughts.  Principal Problem: Bipolar 1 disorder, mixed, severe (HCC) Discharge Diagnoses: Principal Problem:   Bipolar 1 disorder, mixed, severe (HCC) Active Problems:   Suicidal ideation   Past Psychiatric History: Long history of substance use problems off and complicated by a tendency to have mood swings and irritability especially when abusing substances.  Several prior hospitalizations.  Some past history of self-harm.  Past Medical History:  Past Medical History:  Diagnosis Date   Anxiety    Bipolar 1 disorder (HCC)    Chronic headache    Depression 03/30/2020   ETOH abuse 03/30/2020   Hep C w/o coma, chronic (HCC)    Heroin addiction (HCC)    Horseshoe kidney    Marijuana smoker 03/30/2020    Past Surgical History:  Procedure Laterality Date   TONSILLECTOMY     Family History:  Family History  Problem Relation Age of Onset   Hypertension Mother    Diabetes Mother    Hypertension Father    Heart failure Father    Diabetes Father    Family Psychiatric  History: See previous Social History:  Social History   Substance and Sexual Activity  Alcohol Use Not Currently   Alcohol/week: 24.0 standard drinks of alcohol   Types: 24 Cans of beer per week   Comment: 4 gallons a day- pt reports more than this currently     Social History   Substance and Sexual Activity  Drug Use Not Currently   Types: Marijuana, Cocaine, Benzodiazepines   Comment: Heroin addiction- denies current drug use    Social History   Socioeconomic  History   Marital status: Single    Spouse name: Not on file   Number of children: 1   Years of education: 11   Highest education level: 11th grade  Occupational History   Not on file  Tobacco Use   Smoking status: Every Day    Packs/day: 1.00    Types: Cigarettes   Smokeless tobacco: Current    Types: Chew  Vaping Use   Vaping Use: Never used  Substance and Sexual Activity   Alcohol use: Not Currently    Alcohol/week: 24.0 standard drinks of alcohol    Types: 24 Cans of beer per week    Comment: 4 gallons a day- pt reports more than this currently   Drug use: Not Currently    Types: Marijuana, Cocaine, Benzodiazepines    Comment: Heroin addiction- denies current drug use   Sexual activity: Not Currently  Other Topics Concern   Not on file  Social History Narrative   Not on file   Social Determinants of Health   Financial Resource Strain: Not on file  Food Insecurity: Unknown (12/07/2022)   Hunger Vital Sign    Worried About Running Out of Food in the Last Year: Patient refused    Ran Out of Food in the Last Year: Patient refused  Transportation Needs: Unknown (12/07/2022)   PRAPARE - Administrator, Civil Service (Medical): Patient refused    Lack of Transportation (Non-Medical): Patient refused  Physical  Activity: Not on file  Stress: Not on file  Social Connections: Not on file    Hospital Course: Admitted to the psychiatric unit.  Initially he was irritable loud and somewhat belligerent but not physically threatening.  Mostly stayed to himself in his room.  He did eat adequately.  Ultimately became more cooperative and was willing to talk with the treatment team and with the representative from [facility].  Patient admitted he had been abusing Adderall as well as alcohol recently.  Patient is now denying any suicidal or homicidal thoughts and is asking strongly to be discharged.  States that he feels that financially he must take care of his belongings  immediately.  He has been counseled repeatedly about the danger he is ongoing substance abuse and neglect of his health is creating for him and reminded that there is plenty of help available and that [facility] is available if he wants to seriously get involved in treatment.  Patient makes a suggestion at least that he will consider following up.  Discharged today with just the antibiotics he was given.  He has stated that he does not take psychiatric medicines regularly outside the hospital and does not find them helpful.  Physical Findings: AIMS:  , ,  ,  ,    CIWA:    COWS:     Musculoskeletal: Strength & Muscle Tone: within normal limits Gait & Station: normal Patient leans: N/A   Psychiatric Specialty Exam:  Presentation  General Appearance:  Bizarre  Eye Contact: None  Speech: Garbled  Speech Volume: Decreased  Handedness: Right   Mood and Affect  Mood: Angry; Irritable  Affect: Inappropriate   Thought Process  Thought Processes: Irrevelant  Descriptions of Associations:Circumstantial  Orientation:Full (Time, Place and Person)  Thought Content:Logical  History of Schizophrenia/Schizoaffective disorder:No  Duration of Psychotic Symptoms:No data recorded Hallucinations:No data recorded Ideas of Reference:None  Suicidal Thoughts:No data recorded Homicidal Thoughts:No data recorded  Sensorium  Memory: Immediate Poor; Recent Poor; Remote Poor  Judgment: Poor  Insight: Poor   Executive Functions  Concentration: Poor  Attention Span: Poor  Recall: Poor  Fund of Knowledge: Poor  Language: Poor   Psychomotor Activity  Psychomotor Activity:No data recorded  Assets  Assets: Resilience   Sleep  Sleep:No data recorded   Physical Exam: Physical Exam Vitals and nursing note reviewed.  Constitutional:      Appearance: Normal appearance.  HENT:     Head: Normocephalic and atraumatic.     Mouth/Throat:     Pharynx: Oropharynx is  clear.  Eyes:     Pupils: Pupils are equal, round, and reactive to light.  Cardiovascular:     Rate and Rhythm: Normal rate and regular rhythm.  Pulmonary:     Effort: Pulmonary effort is normal.     Breath sounds: Normal breath sounds.  Abdominal:     General: Abdomen is flat.     Palpations: Abdomen is soft.  Musculoskeletal:        General: Normal range of motion.  Skin:    General: Skin is warm and dry.  Neurological:     General: No focal deficit present.     Mental Status: He is alert. Mental status is at baseline.  Psychiatric:        Attention and Perception: Attention normal.        Mood and Affect: Mood is anxious.        Speech: Speech normal.        Behavior: Behavior is cooperative.  Thought Content: Thought content normal.        Cognition and Memory: Cognition normal.        Judgment: Judgment is impulsive.    Review of Systems  Constitutional: Negative.   HENT: Negative.    Eyes: Negative.   Respiratory: Negative.    Cardiovascular: Negative.   Gastrointestinal: Negative.   Musculoskeletal: Negative.   Skin: Negative.   Neurological: Negative.   Psychiatric/Behavioral:  Positive for substance abuse. Negative for depression, hallucinations and suicidal ideas. The patient is nervous/anxious.    Blood pressure 110/70, pulse 68, temperature 98.4 F (36.9 C), temperature source Oral, resp. rate 18, height 5\' 9"  (1.753 m), weight 71.7 kg, SpO2 98 %. Body mass index is 23.34 kg/m.   Social History   Tobacco Use  Smoking Status Every Day   Packs/day: 1.00   Types: Cigarettes  Smokeless Tobacco Current   Types: Chew   Tobacco Cessation:  A prescription for an FDA-approved tobacco cessation medication provided at discharge   Blood Alcohol level:  Lab Results  Component Value Date   [facility] <10 12/07/2022   ETH <10 07/21/2022    Metabolic Disorder Labs:  Lab Results  Component Value Date   HGBA1C 5.2 02/06/2021   MPG 103 02/06/2021   Lab  Results  Component Value Date   PROLACTIN 7.0 04/23/2016   Lab Results  Component Value Date   CHOL 139 02/06/2021   TRIG 55 02/06/2021   HDL 55 02/06/2021   CHOLHDL 2.5 02/06/2021   VLDL 11 02/06/2021   LDLCALC 73 02/06/2021   LDLCALC 105 (H) 04/23/2016    See Psychiatric Specialty Exam and Suicide Risk Assessment completed by Attending Physician prior to discharge.  Discharge destination:  Home  Is patient on multiple antipsychotic therapies at discharge:  No   Has Patient had three or more failed trials of antipsychotic monotherapy by history:  No  Recommended Plan for Multiple Antipsychotic Therapies: NA  Discharge Instructions     Diet - low sodium heart healthy   Complete by: As directed    Increase activity slowly   Complete by: As directed       Allergies as of 12/10/2022       Reactions   Geodon [ziprasidone Hcl] Other (See Comments)   Seizures   Valproic Acid    Levofloxacin Itching        Medication List     STOP taking these medications    albuterol 108 (90 Base) MCG/ACT inhaler Commonly known as: VENTOLIN HFA       TAKE these medications      Indication  cephALEXin 500 MG capsule Commonly known as: KEFLEX Take 1 capsule (500 mg total) by mouth 2 (two) times daily.  Indication: Urinary Tract Infection         Follow-up recommendations:  Other:  Follow-up with [facility].  Follow-up with 12-step meetings.  Stop abusing alcohol and drugs.  Comments: See above  Signed: 12/12/2022, MD 12/10/2022, 10:14 AM

## 2022-12-10 NOTE — Progress Notes (Signed)
Recreation Therapy Notes  Date: 12/10/2022  Time: 10:05 am     Location: Craft room   Behavioral response: Appropriate  Intervention Topic:  Self- Care    Discussion/Intervention:  Group content today was focused on Self-Care. The group defined self-care and some positive ways they care for themselves. Individuals expressed ways and reasons why they neglected any self-care in the past. Patients described ways to improve self-care in the future. The group explained what could happen if they did not do any self-care activities at all. The group participated in the intervention "self-care assessment" where they had a chance to discover some of their weaknesses and strengths in self- care. Patient came up with a self-care plan to improve themselves in the future.  Clinical Observations/Feedback: Patient came to group and defined self-care as taking care of self. He explained that not participating in self-care can cause a mental break down. Individual was social with peers and staff while participating in the intervention.  Nathan [name] LRT/CTRS         Nathan [name] 12/10/2022 11:53 AM

## 2022-12-10 NOTE — BHH Counselor (Signed)
Adult Comprehensive Assessment  Patient ID: Nathan [name], male   DOB: 02/17/1986, 36 y.o.   MRN: [id number]  Information Source: Information source: Patient  Current Stressors:  Patient states their primary concerns and needs for treatment are:: Patient reports a recent relapse on alcohol. Patient states their goals for this hospitilization and ongoing recovery are:: He expresses desire to discharge home.  Living/Environment/Situation:     Family History:     Childhood History:     Education:     Employment/Work Situation:      Architect:      Alcohol/Substance Abuse:      Social Support System:      Leisure/Recreation:      Strengths/Needs:      Discharge Plan:      Summary/Recommendations:   Emergency planning/management officer and Recommendations (to be completed by the evaluator): Patient is a 36 year old, male from [place], [place] He shared that he is here because he relapsed on alcohol. Pt stated desire to discharge home to spend Christmas with his son. He reported that he has a place to stay upon discharge and that his ex will be providing transportation. During time here pt was unwilling to participate in the assessment process until discharge. Pt presented as agitated, irritable, and slept most of his time here when he was not yelling about discharge. Upon discharge, pt endorsed plans to follow up with [facility]. Pt recommended to follow through with aftercare appointment, take medications as instructed, abstain from all drugs/alcohol, and attend sober support groups like AA/NA.  Glenis Smoker. 12/10/2022

## 2022-12-10 NOTE — Progress Notes (Signed)
Patient remained in the bed resting the whole shift. No new behavioral issues to report on shift at this time.

## 2022-12-10 NOTE — BHH Suicide Risk Assessment (Signed)
[facility] Discharge Suicide Risk Assessment   Principal Problem: Bipolar 1 disorder, mixed, severe ([place]) Discharge Diagnoses: Principal Problem:   Bipolar 1 disorder, mixed, severe ([place]) Active Problems:   Suicidal ideation   Total Time spent with patient: 30 minutes  Musculoskeletal: Strength & Muscle Tone: within normal limits Gait & Station: normal Patient leans: N/A  Psychiatric Specialty Exam  Presentation  General Appearance:  Bizarre  Eye Contact: None  Speech: Garbled  Speech Volume: Decreased  Handedness: Right   Mood and Affect  Mood: Angry; Irritable  Duration of Depression Symptoms: Greater than two weeks  Affect: Inappropriate   Thought Process  Thought Processes: Irrevelant  Descriptions of Associations:Circumstantial  Orientation:Full (Time, Place and Person)  Thought Content:Logical  History of Schizophrenia/Schizoaffective disorder:No  Duration of Psychotic Symptoms:No data recorded Hallucinations:No data recorded Ideas of Reference:None  Suicidal Thoughts:No data recorded Homicidal Thoughts:No data recorded  Sensorium  Memory: Immediate Poor; Recent Poor; Remote Poor  Judgment: Poor  Insight: Poor   Executive Functions  Concentration: Poor  Attention Span: Poor  Recall: Poor  Fund of Knowledge: Poor  Language: Poor   Psychomotor Activity  Psychomotor Activity:No data recorded  Assets  Assets: Resilience   Sleep  Sleep:No data recorded  Physical Exam: Physical Exam Vitals reviewed.  Constitutional:      Appearance: Normal appearance.  HENT:     Head: Normocephalic and atraumatic.     Mouth/Throat:     Pharynx: Oropharynx is clear.  Eyes:     Pupils: Pupils are equal, round, and reactive to light.  Cardiovascular:     Rate and Rhythm: Normal rate and regular rhythm.  Pulmonary:     Effort: Pulmonary effort is normal.     Breath sounds: Normal breath sounds.  Abdominal:     General:  Abdomen is flat.     Palpations: Abdomen is soft.  Musculoskeletal:        General: Normal range of motion.  Skin:    General: Skin is warm and dry.  Neurological:     General: No focal deficit present.     Mental Status: He is alert. Mental status is at baseline.  Psychiatric:        Attention and Perception: Attention normal.        Mood and Affect: Mood is anxious.        Speech: Speech normal.        Behavior: Behavior is cooperative.        Thought Content: Thought content normal.        Cognition and Memory: Cognition normal.        Judgment: Judgment is impulsive.    Review of Systems  Constitutional: Negative.   HENT: Negative.    Eyes: Negative.   Respiratory: Negative.    Cardiovascular: Negative.   Gastrointestinal: Negative.   Musculoskeletal: Negative.   Skin: Negative.   Neurological: Negative.   Psychiatric/Behavioral:  Positive for substance abuse. Negative for depression, hallucinations and suicidal ideas. The patient is nervous/anxious.    Blood pressure 110/70, pulse 68, temperature 98.4 F (36.9 C), temperature source Oral, resp. rate 18, height _0  (1.753 m), weight 71.7 kg, SpO2 98 %. Body mass index is 23.34 kg/m.  Mental Status Per Nursing Assessment::   On Admission:  Suicidal ideation indicated by patient (Pt verbally contracts for safety)  Demographic Factors:  Male, Divorced or widowed, Caucasian, Low socioeconomic status, and Living alone  Loss Factors: Financial problems/change in socioeconomic status  Historical Factors: Prior suicide attempts  and Impulsivity  Risk Reduction Factors:   Sense of responsibility to family  Continued Clinical Symptoms:  Depression:   Comorbid alcohol abuse/dependence Alcohol/Substance Abuse/Dependencies  Cognitive Features That Contribute To Risk:  Thought constriction (tunnel vision)    Suicide Risk:  Minimal: No identifiable suicidal ideation.  Patients presenting with no risk factors but with  morbid ruminations; may be classified as minimal risk based on the severity of the depressive symptoms    Plan Of Care/Follow-up recommendations:  Other:  Patient is denying any suicidal thought intent or plan.  Able to discuss his recent problems a little more lucidly.  Asking for discharge today.  No sign of psychosis.  Patient does not appear to be at acute risk of self-harm and is likely to not benefit from further hospital stay but just to get more irritated.  He has been referred as always to [facility] and has met with the representative from [place] [facility] to discuss appropriate follow-up recommendations.  Alethia Berthold, MD 12/10/2022, 10:09 AM

## 2022-12-10 NOTE — BHH Suicide Risk Assessment (Signed)
[facility] INPATIENT:  Family/Significant Other Suicide Prevention Education  Suicide Prevention Education:  Patient Refusal for Family/Significant Other Suicide Prevention Education: The patient Nathan [name] has refused to provide written consent for family/significant other to be provided Family/Significant Other Suicide Prevention Education during admission and/or prior to discharge.  Physician notified.  SPE completed with pt, as pt refused to consent to family contact. SPI pamphlet provided to pt and pt was encouraged to share information with support network, ask questions, and talk about any concerns relating to SPE. Pt denies access to guns/firearms and verbalized understanding of information provided. Mobile Crisis information also provided to pt.  Glenis Smoker 12/10/2022, 1:57 PM

## 2022-12-10 NOTE — Progress Notes (Signed)
  [facility] Adult Case Management Discharge Plan :  Will you be returning to the same living situation after discharge:  Yes,  pt endorsed plans to return to his home. At discharge, do you have transportation home?: Yes,  pt ex-girlfriend/mother of his child to provide transportation. Do you have the ability to pay for your medications: No.  Release of information consent forms completed and in the chart;  Patient's signature needed at discharge.  Patient to Follow up at:  Follow-up Information     [facility] Follow up on 12/17/2022.   Why: Please prsent for scheduled appointment on 12/29 at 0900 AM. Contact information: [address] [place] [address] [phone]                 Next level of care provider has access to [facility] Link:no  Safety Planning and Suicide Prevention discussed: Yes,  SPE completed with pt.     Has patient been referred to the Quitline?: Patient refused referral  Patient has been referred for addiction treatment: Pt. refused referral  Glenis Smoker, LCSW 12/10/2022, 1:58 PM

## 2022-12-10 NOTE — Progress Notes (Signed)
Patient denies SI,HI and AVH. Verbalized understanding discharge instructions and follow up care. All belongings returned from [employer]. Patient escorted out by staff and transported by friend.

## 2023-03-18 ENCOUNTER — Emergency Department
Admission: EM | Admit: 2023-03-18 | Discharge: 2023-03-19 | Disposition: A | Discharge status: Home / Self Care | Payer: Self-pay | Attending: Emergency Medicine | Admitting: Emergency Medicine

## 2023-03-18 ENCOUNTER — Other Ambulatory Visit: Payer: Self-pay

## 2023-03-18 ENCOUNTER — Emergency Department: Payer: Self-pay

## 2023-03-18 ENCOUNTER — Encounter: Payer: Self-pay | Admitting: Intensive Care

## 2023-03-18 DIAGNOSIS — R202 Paresthesia of skin: Secondary | ICD-10-CM | POA: Insufficient documentation

## 2023-03-18 DIAGNOSIS — Z720 Tobacco use: Secondary | ICD-10-CM | POA: Insufficient documentation

## 2023-03-18 DIAGNOSIS — R079 Chest pain, unspecified: Secondary | ICD-10-CM

## 2023-03-18 DIAGNOSIS — R0789 Other chest pain: Secondary | ICD-10-CM | POA: Insufficient documentation

## 2023-03-18 LAB — CBC
HCT: 42.5 % (ref 39.0–52.0)
Hemoglobin: 13.8 g/dL (ref 13.0–17.0)
MCH: 30.5 pg (ref 26.0–34.0)
MCHC: 32.5 g/dL (ref 30.0–36.0)
MCV: 94 fL (ref 80.0–100.0)
Platelets: 276 10*3/uL (ref 150–400)
RBC: 4.52 MIL/uL (ref 4.22–5.81)
RDW: 13.5 % (ref 11.5–15.5)
WBC: 6.6 10*3/uL (ref 4.0–10.5)
nRBC: 0 % (ref 0.0–0.2)

## 2023-03-18 LAB — ETHANOL: Alcohol, Ethyl (B): <10 mg/dL (ref <10)

## 2023-03-18 LAB — BASIC METABOLIC PANEL
Anion gap: 6 (ref 5–15)
BUN: 16 mg/dL (ref 6–20)
CO2: 26 mmol/L (ref 22–32)
Calcium: 9 mg/dL (ref 8.9–10.3)
Chloride: 105 mmol/L (ref 98–111)
Creatinine, Ser: 0.66 mg/dL (ref 0.61–1.24)
GFR, Estimated: >60 mL/min (ref >60)
Glucose, Bld: 94 mg/dL (ref 70–99)
Potassium: 4.1 mmol/L (ref 3.5–5.1)
Sodium: 137 mmol/L (ref 135–145)

## 2023-03-18 LAB — TROPONIN I (HIGH SENSITIVITY)
Troponin I (High Sensitivity): 3 ng/L (ref <18)
Troponin I (High Sensitivity): 3 ng/L (ref <18)

## 2023-03-18 MED ORDER — IBUPROFEN 400 MG PO TABS
400.0000 mg | ORAL_TABLET | Freq: Once | ORAL | Status: AC
  Administered 2023-03-18: 400 mg via ORAL
  Filled 2023-03-18: qty 1

## 2023-03-18 MED ORDER — ONDANSETRON 4 MG PO TBDP
4.0000 mg | ORAL_TABLET | Freq: Once | ORAL | Status: AC
  Administered 2023-03-18: 4 mg via ORAL
  Filled 2023-03-18: qty 1

## 2023-03-18 MED ORDER — NALOXONE HCL 2 MG/2ML IJ SOSY
0.4000 mg | PREFILLED_SYRINGE | Freq: Once | INTRAMUSCULAR | Status: AC
  Administered 2023-03-18: 0.4 mg via INTRAVENOUS
  Filled 2023-03-18: qty 2

## 2023-03-18 MED ORDER — ACETAMINOPHEN 500 MG PO TABS
1000.0000 mg | ORAL_TABLET | Freq: Once | ORAL | Status: AC
  Administered 2023-03-18: 1000 mg via ORAL
  Filled 2023-03-18: qty 2

## 2023-03-18 NOTE — ED Notes (Signed)
ED Provider at bedside. 

## 2023-03-18 NOTE — ED Notes (Addendum)
Patient was able to swallow both medications while sitting upright without difficulty. Patient handled medication and cup of water independently without difficulty. HOB was then readjusted for patient's comfort. Patient states Nitro spray gave minimal relief from chest pain.

## 2023-03-18 NOTE — ED Notes (Signed)
Patient taken to imaging. 

## 2023-03-18 NOTE — ED Notes (Signed)
Report given to Brian RN

## 2023-03-18 NOTE — ED Triage Notes (Addendum)
Patient c/o sharp central chest pain. No radiation. Patient crying in triage.  Pocket knife given to security  History hep C  EMS vitals: 75HR 98%RA Patient given 1 nitro and 105/69 b/p

## 2023-03-18 NOTE — ED Provider Notes (Signed)
[facility] Provider Note    Event Date/Time   First MD Initiated Contact with Patient 03/18/23 1708     (approximate)   History   Chest Pain   HPI  [facility] Veltkamp is a 37 y.o. male with past medical history bipolar disorder, substance use disorder, depression and anxiety who presents with chest pain.  Symptoms started acutely around 2:00 when he was playing with his son.  Describes a sharp central pain with some associated tingling in the left arm.  Lasted for about 30 minutes.  Says it was not exertional or pleuritic during the time when he had the pain.  This severe pain has improved, he does continue to have some ongoing ache in the chest.  He denies new cough fevers chills.  Has history of similar.  The patient denies hx of prior DVT/PE, unilateral leg pain/swelling, hormone use, recent surgery, hx of cancer, prolonged immobilization, or hemoptysis.   She did get sublingual nitro with EMS this is improved symptoms somewhat.  Does have history of IV drug use says he has not used any IV drugs in 1 year.     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[place])    Horseshoe kidney    Marijuana smoker 03/30/2020    Patient Active Problem List   Diagnosis Date Noted   Current moderate episode of major depressive disorder ([place]) 12/07/2022   MDD (major depressive disorder), recurrent episode, severe ([place]) 12/07/2022   Loosening of tooth 11/11/2021   Bipolar 1 disorder ([place]) 08/27/2021   Alcohol withdrawal ([place]) 03/25/2021   Bipolar depression ([place]) 03/25/2021   Severe recurrent major depression without psychotic features ([place]) 02/05/2021   Abdominal pain    Moderate alcohol use disorder ([place]) 08/29/2020   Intentional overdose of drug in tablet form ([place]) 08/28/2020   Schizoaffective disorder  ([place]) 07/26/2020   Amphetamine use 07/25/2020   Chronic headache 06/26/2020   Encounter to establish care 06/26/2020   History of gastroesophageal reflux (GERD) 06/26/2020   Cannabis use disorder, moderate, in controlled environment ([place]) 04/10/2020   Cocaine use disorder ([place]) 04/10/2020   Alcohol use disorder, severe, dependence ([place]) 04/09/2020   Stress reaction, acute 04/09/2020   Non-suicidal self harm 04/05/2020   Bipolar 1 disorder, depressed ([place]) 01/31/2018   Intercostal pain 09/07/2016   Bipolar 1 disorder, mixed, severe ([place]) 04/22/2016   Alcohol abuse 04/22/2016   Suicidal ideation 04/22/2016   Tobacco use disorder 04/22/2016   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 --      Excl. in [place]? --     Most recent vital signs: Vitals:   03/18/23 1724 03/18/23 1730  BP: (!) 114/54 99/68  Pulse: 69 63  Resp: 18 16  Temp:    SpO2: 95% 98%     General: Awake, no distress.  CV:  Good peripheral perfusion.  No peripheral edema or signs of DVT Resp:  Normal effort.  Lung sounds are clear Abd:  No distention.  Neuro:             Awake, Alert, Oriented x 3  Other:     ED Results / Procedures / Treatments  Labs (all labs ordered are listed, but only abnormal results are displayed) Labs Reviewed  BASIC METABOLIC PANEL  CBC  URINE DRUG SCREEN, QUALITATIVE ([facility] ONLY)  TROPONIN I (HIGH SENSITIVITY)  TROPONIN I (HIGH SENSITIVITY)      EKG  EKG reviewed interpreted myself shows sinus rhythm normal axis normal intervals early repolarization similar to prior EKG   RADIOLOGY I reviewed and interpreted the CXR which does not show any acute cardiopulmonary process    PROCEDURES:  Critical Care performed: No  Procedures  The patient is on the cardiac monitor to evaluate for evidence of arrhythmia and/or significant heart rate changes.   MEDICATIONS ORDERED IN ED: Medications  acetaminophen (TYLENOL) tablet 1,000 mg (1,000 mg Oral Given 03/18/23 1757)  ibuprofen (ADVIL) tablet 400 mg (400 mg Oral Given 03/18/23 1757)  ondansetron (ZOFRAN-ODT) disintegrating tablet 4 mg (4 mg Oral Given 03/18/23 1915)     IMPRESSION / MDM / ASSESSMENT AND PLAN / ED COURSE  I reviewed the triage vital signs and the nursing notes.                              Patient's presentation is most consistent with acute complicated illness / injury requiring diagnostic workup.  Differential diagnosis includes, but is not limited to, pneumothorax, pericarditis/myocarditis, ACS, reflux, musculoskeletal pain  Patient is a 37 year old male who presents with acute onset of sharp chest pain.  Started around 2 PM when he was playing with his son.  Describes it as sharp without associated symptoms other than some tingling in the left arm.  Did get nitroglycerin with EMS and this helped somewhat.  Currently pain is significant improved.  Says the severe pain lasted for about half an hour.  Describes some ongoing dull ache in the center of the chest.  Denies dyspnea nausea or diaphoresis.  Denies history of similar.  Patient's blood pressure is on the soft side vital signs are otherwise reassuring.  Lung sounds are clear no signs of DVT.  EKG shows high voltage with early repolarization changes but no signs of ischemia and EKG looks similar to prior.  First troponin is negative.  Given onset of symptoms we will repeat troponin.  Chest x-ray is clear no  evidence of pneumothorax or edema or pneumonia.  Given patient's significant improved symptoms I have low suspicion for dissection he also has no risk factors for this.  He is low risk by [place] for PE and is PERC negative so feel that this sufficiently rules out PE.  Repeat troponin is negative.  On reassessment patient is somewhat drowsy.  He is complaining of headache.  He is more drowsy than when I first saw him.  Tells me he will use marijuana today he is denying other drug use although he  seems somewhat intoxicated.  Will obtain a CT head.  He is also complaining of nausea will give ODT Zofran.  Patient CT head is negative.  Patient signed out pending reassessment.  I do strongly suspect that he is under the influence of a substance.  I have added on a UDS.  His vital signs have remained stable.      FINAL CLINICAL IMPRESSION(S) / ED DIAGNOSES   Final diagnoses:  Chest pain, unspecified type     Rx / DC Orders   ED Discharge Orders     None        Note:  This document was prepared using Dragon voice recognition software and may include unintentional dictation errors.   Rada Hay, MD 03/18/23 [phone]

## 2023-03-18 NOTE — ED Provider Notes (Signed)
Assumed patient care from Acquanetta Belling MD. patient remained very sedated throughout my care.  I checked on him several times and he was arousable to sternal rub but only momentarily and then will go back to sleep.  I did give him 0.4 of Narcan and patient did become slightly more alert but remained drowsy.  I did contact patient's emergency contacts listed and they are unable to bring patient home.  Will transition patient to main side of the emergency department for continued observation.   Vallarie Mare [place], PA-C 03/18/23 2242    Lavonia Drafts, MD 03/18/23 2245

## 2023-03-18 NOTE — ED Notes (Signed)
Patient continues to wake easily, answers questions appropriately, and then fall back to sleep immediately

## 2023-03-18 NOTE — ED Notes (Signed)
Patient appears drowsy, answers questions easily, but keeps eyes closed. Patient dozes off easily. Patient denies any drug use except for marijuana. Patient denies nausea or shortness of breath. Patient states chest pain increases when sitting upright.

## 2023-03-19 NOTE — ED Notes (Signed)
Patient discharged at this time. Ambulated to lobby with independent and steady gait. Breathing unlabored speaking in full sentences. Verbalized understanding of all discharge, follow up, and medication teaching. Discharged homed with all belongings.   Patient provided with bus pass for ride home. Pt discharged to lobby to wait for busses to start running.

## 2023-03-19 NOTE — ED Provider Notes (Signed)
Patient has achieved clinical sobriety and suitable for outpatient management.   Vladimir Crofts, MD 03/19/23 [phone]

## 2023-04-21 ENCOUNTER — Other Ambulatory Visit: Payer: Self-pay

## 2023-04-21 ENCOUNTER — Encounter: Payer: Self-pay | Admitting: Emergency Medicine

## 2023-04-21 ENCOUNTER — Emergency Department: Payer: Self-pay

## 2023-04-21 ENCOUNTER — Emergency Department
Admission: EM | Admit: 2023-04-21 | Discharge: 2023-04-21 | Disposition: A | Discharge status: Home / Self Care | Payer: Self-pay | Attending: Emergency Medicine | Admitting: Emergency Medicine

## 2023-04-21 DIAGNOSIS — F17210 Nicotine dependence, cigarettes, uncomplicated: Secondary | ICD-10-CM | POA: Insufficient documentation

## 2023-04-21 DIAGNOSIS — R091 Pleurisy: Secondary | ICD-10-CM | POA: Insufficient documentation

## 2023-04-21 LAB — CBC
HCT: 47.2 % (ref 39.0–52.0)
Hemoglobin: 15.6 g/dL (ref 13.0–17.0)
MCH: 31.1 pg (ref 26.0–34.0)
MCHC: 33.1 g/dL (ref 30.0–36.0)
MCV: 94 fL (ref 80.0–100.0)
Platelets: 266 10*3/uL (ref 150–400)
RBC: 5.02 MIL/uL (ref 4.22–5.81)
RDW: 12.7 % (ref 11.5–15.5)
WBC: 8.8 10*3/uL (ref 4.0–10.5)
nRBC: 0 % (ref 0.0–0.2)

## 2023-04-21 LAB — BASIC METABOLIC PANEL
Anion gap: 9 (ref 5–15)
BUN: 11 mg/dL (ref 6–20)
CO2: 26 mmol/L (ref 22–32)
Calcium: 9.2 mg/dL (ref 8.9–10.3)
Chloride: 103 mmol/L (ref 98–111)
Creatinine, Ser: 0.69 mg/dL (ref 0.61–1.24)
GFR, Estimated: >60 mL/min (ref >60)
Glucose, Bld: 83 mg/dL (ref 70–99)
Potassium: 4.1 mmol/L (ref 3.5–5.1)
Sodium: 138 mmol/L (ref 135–145)

## 2023-04-21 LAB — TROPONIN I (HIGH SENSITIVITY): Troponin I (High Sensitivity): 5 ng/L (ref <18)

## 2023-04-21 MED ORDER — IPRATROPIUM-ALBUTEROL 0.5-2.5 (3) MG/3ML IN SOLN
3.0000 mL | Freq: Once | RESPIRATORY_TRACT | Status: AC
  Administered 2023-04-21: 3 mL via RESPIRATORY_TRACT
  Filled 2023-04-21: qty 3

## 2023-04-21 MED ORDER — KETOROLAC TROMETHAMINE 30 MG/ML IJ SOLN
30.0000 mg | Freq: Once | INTRAMUSCULAR | Status: DC
  Filled 2023-04-21: qty 1

## 2023-04-21 NOTE — ED Triage Notes (Signed)
Patient to ED via POV for left sided chest pain that started yesterday. Patient has hyperthyroidism and states he might have a fib not not sure. Take daily aspirin.

## 2023-04-21 NOTE — ED Provider Notes (Signed)
[facility] Provider Note    Event Date/Time   First MD Initiated Contact with Patient 04/21/23 1416     (approximate)   History   Chest Pain   HPI  Nathan [name], Nathan [name] is a 37 y.o. male who presents with complaints of left superior chest discomfort with deep inspiration.  He reports that started yesterday while he was mowing the lawn.  Denies cough fevers or chills.  Does not feel short of breath.  No calf pain or swelling.  No history of DVTs.  No history of PE.  No injury to the area.  He does smoke cigarettes     Physical Exam   Triage Vital Signs: ED Triage Vitals  Enc Vitals Group     BP 04/21/23 1349 123/73     Pulse Rate 04/21/23 1349 (!) 122     Resp 04/21/23 1349 18     Temp 04/21/23 1349 98 F (36.7 C)     Temp Source 04/21/23 1349 Oral     SpO2 04/21/23 1349 100 %     Weight --      Height --      Head Circumference --      Peak Flow --      Pain Score 04/21/23 1348 8     Pain Loc --      Pain Edu? --      Excl. in [place]? --     Most recent vital signs: Vitals:   04/21/23 1349 04/21/23 1528  BP: 123/73 117/72  Pulse: (!) 122 93  Resp: 18 16  Temp: 98 F (36.7 C) 97.7 F (36.5 C)  SpO2: 100% 98%     General: Awake, no distress.  CV:  Good peripheral perfusion.  Resp:  Normal effort.  Clear to auscultation bilaterally Abd:  No distention.  Other:     ED Results / Procedures / Treatments   Labs (all labs ordered are listed, but only abnormal results are displayed) Labs Reviewed  BASIC METABOLIC PANEL  CBC  TROPONIN I (HIGH SENSITIVITY)     EKG  ED ECG REPORT I, Jene Every, the attending physician, personally viewed and interpreted this ECG.  Date: 04/21/2023  Rhythm: normal sinus rhythm QRS Axis: normal Intervals: normal ST/T Wave abnormalities: normal Narrative Interpretation: no evidence of acute ischemia    RADIOLOGY Chest x-ray viewed interpret by me, no acute  abnormality   PROCEDURES:  Critical Care performed:   Procedures   MEDICATIONS ORDERED IN ED: Medications  ketorolac (TORADOL) 30 MG/ML injection 30 mg (30 mg Intramuscular Not Given 04/21/23 1456)  ipratropium-albuterol (DUONEB) 0.5-2.5 (3) MG/3ML nebulizer solution 3 mL (3 mLs Nebulization Given 04/21/23 1456)     IMPRESSION / MDM / ASSESSMENT AND PLAN / ED COURSE  I reviewed the triage vital signs and the nursing notes. Patient's presentation is most consistent with acute presentation with potential threat to life or bodily function.  Patient presents with chest pain as detailed above, differential includes ACS although this is not likely given his reassuring EKG, pneumonia, pneumothorax, PE  High sensitive troponin is normal.  Chest x-ray is without evidence of pneumothorax or pneumonia.  I recommended CT angiography to evaluate for PE however patient declined.  Will trial DuoNeb, IM Toradol  After treatment patient is feeling improved but still describes some discomfort in his left upper chest, I again recommended CT scan however he refused, he reports he will come back if symptoms worsen.  FINAL CLINICAL IMPRESSION(S) / ED DIAGNOSES   Final diagnoses:  Pleurisy     Rx / DC Orders   ED Discharge Orders     None        Note:  This document was prepared using Dragon voice recognition software and may include unintentional dictation errors.   Jene Every, MD 04/21/23 [phone]

## 2023-04-24 ENCOUNTER — Emergency Department
Admission: EM | Admit: 2023-04-24 | Discharge: 2023-04-24 | Disposition: A | Discharge status: Home / Self Care | Payer: Self-pay
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
## 2023-04-24 NOTE — ED Notes (Signed)
No answer when called to triage.

## 2023-05-24 ENCOUNTER — Other Ambulatory Visit: Payer: Self-pay

## 2023-05-24 ENCOUNTER — Emergency Department
Admission: EM | Admit: 2023-05-24 | Discharge: 2023-05-25 | Disposition: A | Discharge status: Home / Self Care | Payer: Self-pay | Attending: Emergency Medicine | Admitting: Emergency Medicine

## 2023-05-24 ENCOUNTER — Encounter: Payer: Self-pay | Admitting: *Deleted

## 2023-05-24 DIAGNOSIS — R079 Chest pain, unspecified: Secondary | ICD-10-CM

## 2023-05-24 DIAGNOSIS — Z5900 Homelessness unspecified: Secondary | ICD-10-CM | POA: Insufficient documentation

## 2023-05-24 DIAGNOSIS — Z609 Problem related to social environment, unspecified: Secondary | ICD-10-CM | POA: Insufficient documentation

## 2023-05-24 LAB — CBC
HCT: 40.6 % (ref 39.0–52.0)
Hemoglobin: 13.6 g/dL (ref 13.0–17.0)
MCH: 30.7 pg (ref 26.0–34.0)
MCHC: 33.5 g/dL (ref 30.0–36.0)
MCV: 91.6 fL (ref 80.0–100.0)
Platelets: 256 10*3/uL (ref 150–400)
RBC: 4.43 MIL/uL (ref 4.22–5.81)
RDW: 14 % (ref 11.5–15.5)
WBC: 9.1 10*3/uL (ref 4.0–10.5)
nRBC: 0 % (ref 0.0–0.2)

## 2023-05-24 LAB — BASIC METABOLIC PANEL
Anion gap: 7 (ref 5–15)
BUN: 12 mg/dL (ref 6–20)
CO2: 24 mmol/L (ref 22–32)
Calcium: 9.2 mg/dL (ref 8.9–10.3)
Chloride: 104 mmol/L (ref 98–111)
Creatinine, Ser: 0.69 mg/dL (ref 0.61–1.24)
GFR, Estimated: >60 mL/min (ref >60)
Glucose, Bld: 150 mg/dL — ABNORMAL HIGH (ref 70–99)
Potassium: 3.6 mmol/L (ref 3.5–5.1)
Sodium: 135 mmol/L (ref 135–145)

## 2023-05-24 LAB — URINE DRUG SCREEN, QUALITATIVE (ARMC ONLY)
Amphetamines, Ur Screen: POSITIVE — AB
Barbiturates, Ur Screen: NOT DETECTED
Benzodiazepine, Ur Scrn: NOT DETECTED
Cannabinoid 50 Ng, Ur ~~LOC~~: POSITIVE — AB
Cocaine Metabolite,Ur ~~LOC~~: NOT DETECTED
MDMA (Ecstasy)Ur Screen: NOT DETECTED
Methadone Scn, Ur: NOT DETECTED
Opiate, Ur Screen: NOT DETECTED
Phencyclidine (PCP) Ur S: NOT DETECTED
Tricyclic, Ur Screen: NOT DETECTED

## 2023-05-24 LAB — TROPONIN I (HIGH SENSITIVITY): Troponin I (High Sensitivity): 9 ng/L (ref <18)

## 2023-05-24 MED ORDER — ACETAMINOPHEN 325 MG PO TABS
650.0000 mg | ORAL_TABLET | Freq: Once | ORAL | Status: AC
  Administered 2023-05-24: 650 mg via ORAL
  Filled 2023-05-24: qty 2

## 2023-05-24 MED ORDER — NICOTINE POLACRILEX 2 MG MT GUM: 2.0000 mg | CHEWING_GUM | OROMUCOSAL | Status: DC | PRN

## 2023-05-24 NOTE — ED Provider Notes (Signed)
[facility] Provider Note    Event Date/Time   First MD Initiated Contact with Patient 05/24/23 2126     (approximate)   History   Chest Pain and Mental Health Problem   HPI  Nathan [name] is a 37 y.o. male  here with multiple complaints. Pt states his primary issue is that everything has "fallen apart." He has a h/o polysubstance use but states he has been clean, has been trying to find a job but his mother passed away recently and everything "fell apart." Reports he feels hopeless. Denies any si. States he does still occasionally use drugs but not "as much as before."       Physical Exam   Triage Vital Signs: ED Triage Vitals  Enc Vitals Group     BP 05/24/23 2041 (!) 149/89     Pulse Rate 05/24/23 2041 90     Resp 05/24/23 2041 18     Temp 05/24/23 2041 98 F (36.7 C)     Temp Source 05/24/23 2041 Oral     SpO2 05/24/23 2041 100 %     Weight 05/24/23 2042 123 lb 7.3 oz (56 kg)     Height 05/24/23 2042 5\' 9"  (1.753 m)     Head Circumference --      Peak Flow --      Pain Score 05/24/23 2041 5     Pain Loc --      Pain Edu? --      Excl. in [place]? --     Most recent vital signs: Vitals:   05/24/23 2041  BP: (!) 149/89  Pulse: 90  Resp: 18  Temp: 98 F (36.7 C)  SpO2: 100%     General: Awake, no distress.  CV:  Good peripheral perfusion.  Resp:  Normal work of breathing.  Abd:  No distention.  Other:  Tearful, but denies SI, HI, AVH.   ED Results / Procedures / Treatments   Labs (all labs ordered are listed, but only abnormal results are displayed) Labs Reviewed  BASIC METABOLIC PANEL - Abnormal; Notable for the following components:      Result Value   Glucose, Bld 150 (*)    All other components within normal limits  URINE DRUG SCREEN, QUALITATIVE ([facility] ONLY) - Abnormal; Notable for the following components:   Amphetamines, Ur Screen POSITIVE (*)    Cannabinoid 50 Ng, Ur [place] POSITIVE (*)    All other components  within normal limits  CBC  ACETAMINOPHEN LEVEL  ETHANOL  SALICYLATE LEVEL  TROPONIN I (HIGH SENSITIVITY)     EKG    RADIOLOGY    I also independently reviewed and agree with radiologist interpretations.   PROCEDURES:  Critical Care performed: No   MEDICATIONS ORDERED IN ED: Medications  nicotine polacrilex (NICORETTE) gum 2 mg (has no administration in time range)  acetaminophen (TYLENOL) tablet 650 mg (650 mg Oral Given 05/24/23 2257)     IMPRESSION / MDM / ASSESSMENT AND PLAN / ED COURSE  I reviewed the triage vital signs and the nursing notes.                              Differential diagnosis includes, but is not limited to, situational anxiety/adjustment disorder, depression with SI, substance-induced mood d/o  Patient's presentation is most consistent with acute presentation with potential threat to life or bodily function.  The patient is on the cardiac monitor  to evaluate for evidence of arrhythmia and/or significant heart rate changes  37 yo M with h/'o  schizoaffective d/o, polysubstance abuse here with worsening depression, anxiety and request for psych eval in setting of his mother recently passing away. Will c/s Psych and TTS. Pt appears medically stable. CBC unremarkable. BMP normal. UDS + THC and amphetamines.    FINAL CLINICAL IMPRESSION(S) / ED DIAGNOSES   Final diagnoses:  Homelessness  Poor social situation     Rx / DC Orders   ED Discharge Orders     None        Note:  This document was prepared using Dragon voice recognition software and may include unintentional dictation errors.   Shaune Pollack, MD 05/25/23 [phone]

## 2023-05-24 NOTE — ED Triage Notes (Signed)
Pt brought in by ems from the [employer].  Pt ate 2 edible mushrooms at 1830 tonight.  Pt reports chest pain and anxiety.  Pt is homeless.  Pt calm and cooperative.

## 2023-05-24 NOTE — ED Notes (Signed)
ED Provider at bedside. 

## 2023-05-24 NOTE — ED Notes (Signed)
Pt dressed out in triage room 2 with this Clinical research associate and EDT Mayra.  Pt dressed out into burgundy and blue colored BH scrubs.  Pt belongings placed into pt belongings bag and labeled with pt labels.    Pt belongings: Black tank top  [employer] of black boots  Pair or white socks Black underwear   Brown belt  [employer]  Pack of cigarettes  Silver colored flash light  Gray colored ring to pts right ring finger - pt reports ring will not come off

## 2023-05-24 NOTE — ED Notes (Signed)
Meal tray given, pt requesting wipes to clean feet and new socks, supplies provided.

## 2023-05-24 NOTE — ED Notes (Signed)
Pt admits to depression over recent mother's death; denies active SI or HI but st he would like to speak with psychiatry at this time
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
## 2023-05-25 LAB — ETHANOL: Alcohol, Ethyl (B): <10 mg/dL (ref <10)

## 2023-05-25 LAB — SALICYLATE LEVEL: Salicylate Lvl: <7 mg/dL — ABNORMAL LOW (ref 7.0–30.0)

## 2023-05-25 LAB — ACETAMINOPHEN LEVEL: Acetaminophen (Tylenol), Serum: <10 ug/mL — ABNORMAL LOW (ref 10–30)

## 2023-05-25 NOTE — Discharge Instructions (Signed)
Return to the ER for new, worsening or persistent severe chest pain, anxiety, hearing voices, other hallucinations, any thoughts of wanting to hurt yourself or others or any other new or worsening symptoms that concern you.

## 2023-05-25 NOTE — ED Notes (Signed)
Patient refusing admission to inpatient unit for psych services and is requesting to be discharge.  Patient denies SI/HI/AVH at this time. EDP Dr. Marisa Severin made aware.

## 2023-05-25 NOTE — ED Notes (Signed)
Belongings returned to patient. Patient denies SI/HI/AVH. Patient states he just needed some sleep. Patient walked out to lobby.

## 2023-05-25 NOTE — ED Notes (Signed)
EDP Dr. Marisa Severin speaking with patient.

## 2023-05-25 NOTE — ED Notes (Signed)
Vol moved to bhu 3 °

## 2023-05-25 NOTE — ED Provider Notes (Signed)
-----------------------------------------   3:46 AM on 05/25/2023 -----------------------------------------  I took over care of this patient from Dr. Erma Heritage.  The patient has been resting in the BHU for the last several hours.  He is voluntary.  He was evaluated by TTS but has not yet been seen by psychiatry.  However the patient now states that he is feeling better and would like to go home.  On my reassessment the patient is alert and oriented.  He appears calm and cooperative.  He states he slept and is feeling much better.  He denies any SI or HI.  He states that he feels safe.  He reports that his girlfriend can pick him up a little bit later in the morning.  At this time there is no evidence of acute danger to self or others.  The patient is voluntary.  He is free to leave at this time.  I gave him strict return precautions and he expressed understanding.   Dionne Bucy, MD 05/25/23 [phone]

## 2023-05-26 ENCOUNTER — Other Ambulatory Visit: Payer: Self-pay

## 2023-05-26 ENCOUNTER — Emergency Department
Admission: EM | Admit: 2023-05-26 | Discharge: 2023-05-29 | Disposition: A | Discharge status: Home / Self Care | Payer: No Typology Code available for payment source | Attending: Emergency Medicine | Admitting: Emergency Medicine

## 2023-05-26 ENCOUNTER — Encounter: Payer: Self-pay | Admitting: Emergency Medicine

## 2023-05-26 DIAGNOSIS — F1994 Other psychoactive substance use, unspecified with psychoactive substance-induced mood disorder: Secondary | ICD-10-CM | POA: Diagnosis not present

## 2023-05-26 DIAGNOSIS — Z79899 Other long term (current) drug therapy: Secondary | ICD-10-CM | POA: Insufficient documentation

## 2023-05-26 DIAGNOSIS — F1590 Other stimulant use, unspecified, uncomplicated: Secondary | ICD-10-CM | POA: Diagnosis present

## 2023-05-26 DIAGNOSIS — F22 Delusional disorders: Secondary | ICD-10-CM

## 2023-05-26 DIAGNOSIS — F19950 Other psychoactive substance use, unspecified with psychoactive substance-induced psychotic disorder with delusions: Secondary | ICD-10-CM | POA: Insufficient documentation

## 2023-05-26 DIAGNOSIS — F15959 Other stimulant use, unspecified with stimulant-induced psychotic disorder, unspecified: Secondary | ICD-10-CM | POA: Diagnosis present

## 2023-05-26 DIAGNOSIS — F1220 Cannabis dependence, uncomplicated: Secondary | ICD-10-CM | POA: Diagnosis present

## 2023-05-26 LAB — COMPREHENSIVE METABOLIC PANEL
ALT: 48 U/L — ABNORMAL HIGH (ref 0–44)
AST: 44 U/L — ABNORMAL HIGH (ref 15–41)
Albumin: 4.4 g/dL (ref 3.5–5.0)
Alkaline Phosphatase: 83 U/L (ref 38–126)
Anion gap: 9 (ref 5–15)
BUN: 17 mg/dL (ref 6–20)
CO2: 24 mmol/L (ref 22–32)
Calcium: 9.6 mg/dL (ref 8.9–10.3)
Chloride: 102 mmol/L (ref 98–111)
Creatinine, Ser: 0.71 mg/dL (ref 0.61–1.24)
GFR, Estimated: >60 mL/min (ref >60)
Glucose, Bld: 97 mg/dL (ref 70–99)
Potassium: 4.6 mmol/L (ref 3.5–5.1)
Sodium: 135 mmol/L (ref 135–145)
Total Bilirubin: 1.5 mg/dL — ABNORMAL HIGH (ref 0.3–1.2)
Total Protein: 8.3 g/dL — ABNORMAL HIGH (ref 6.5–8.1)

## 2023-05-26 LAB — CBC
HCT: 46.6 % (ref 39.0–52.0)
Hemoglobin: 15.2 g/dL (ref 13.0–17.0)
MCH: 30.7 pg (ref 26.0–34.0)
MCHC: 32.6 g/dL (ref 30.0–36.0)
MCV: 94.1 fL (ref 80.0–100.0)
Platelets: 293 10*3/uL (ref 150–400)
RBC: 4.95 MIL/uL (ref 4.22–5.81)
RDW: 14.2 % (ref 11.5–15.5)
WBC: 9 10*3/uL (ref 4.0–10.5)
nRBC: 0 % (ref 0.0–0.2)

## 2023-05-26 LAB — URINE DRUG SCREEN, QUALITATIVE (ARMC ONLY)
Amphetamines, Ur Screen: POSITIVE — AB
Barbiturates, Ur Screen: NOT DETECTED
Benzodiazepine, Ur Scrn: NOT DETECTED
Cannabinoid 50 Ng, Ur ~~LOC~~: POSITIVE — AB
Cocaine Metabolite,Ur ~~LOC~~: NOT DETECTED
MDMA (Ecstasy)Ur Screen: NOT DETECTED
Methadone Scn, Ur: NOT DETECTED
Opiate, Ur Screen: NOT DETECTED
Phencyclidine (PCP) Ur S: NOT DETECTED
Tricyclic, Ur Screen: NOT DETECTED

## 2023-05-26 LAB — ETHANOL: Alcohol, Ethyl (B): <10 mg/dL (ref <10)

## 2023-05-26 MED ORDER — DIPHENHYDRAMINE HCL 25 MG PO CAPS: 50.0000 mg | ORAL_CAPSULE | Freq: Four times a day (QID) | ORAL | Status: DC | PRN

## 2023-05-26 MED ORDER — DIPHENHYDRAMINE HCL 25 MG PO CAPS
50.0000 mg | ORAL_CAPSULE | Freq: Three times a day (TID) | ORAL | Status: DC | PRN
  Administered 2023-05-27 – 2023-05-28 (×2): 50 mg via ORAL
  Filled 2023-05-26 (×2): qty 2

## 2023-05-26 MED ORDER — IBUPROFEN 600 MG PO TABS
600.0000 mg | ORAL_TABLET | Freq: Three times a day (TID) | ORAL | Status: DC | PRN
  Administered 2023-05-28 – 2023-05-29 (×2): 600 mg via ORAL
  Filled 2023-05-26 (×2): qty 1

## 2023-05-26 MED ORDER — LORAZEPAM 2 MG/ML IJ SOLN: 2.0000 mg | Freq: Three times a day (TID) | INTRAMUSCULAR | Status: DC | PRN

## 2023-05-26 MED ORDER — DIPHENHYDRAMINE HCL 50 MG/ML IJ SOLN: 50.0000 mg | Freq: Three times a day (TID) | INTRAMUSCULAR | Status: DC | PRN

## 2023-05-26 MED ORDER — HALOPERIDOL LACTATE 5 MG/ML IJ SOLN
5.0000 mg | Freq: Four times a day (QID) | INTRAMUSCULAR | Status: DC | PRN
  Filled 2023-05-26: qty 1

## 2023-05-26 MED ORDER — ONDANSETRON HCL 4 MG PO TABS: 4.0000 mg | ORAL_TABLET | Freq: Three times a day (TID) | ORAL | Status: DC | PRN

## 2023-05-26 MED ORDER — LORAZEPAM 2 MG/ML IJ SOLN
2.0000 mg | Freq: Four times a day (QID) | INTRAMUSCULAR | Status: DC | PRN
  Administered 2023-05-26: 2 mg via INTRAMUSCULAR
  Filled 2023-05-26 (×2): qty 1

## 2023-05-26 MED ORDER — ALUM & MAG HYDROXIDE-SIMETH 200-200-20 MG/5ML PO SUSP: 30.0000 mL | Freq: Four times a day (QID) | ORAL | Status: DC | PRN

## 2023-05-26 MED ORDER — DIPHENHYDRAMINE HCL 50 MG/ML IJ SOLN
50.0000 mg | Freq: Four times a day (QID) | INTRAMUSCULAR | Status: DC | PRN
  Administered 2023-05-26: 50 mg via INTRAMUSCULAR
  Filled 2023-05-26: qty 1

## 2023-05-26 MED ORDER — HALOPERIDOL 5 MG PO TABS: 5.0000 mg | ORAL_TABLET | Freq: Three times a day (TID) | ORAL | Status: DC | PRN

## 2023-05-26 MED ORDER — LORAZEPAM 2 MG PO TABS: 2.0000 mg | ORAL_TABLET | Freq: Four times a day (QID) | ORAL | Status: DC | PRN

## 2023-05-26 MED ORDER — LORAZEPAM 2 MG PO TABS
2.0000 mg | ORAL_TABLET | Freq: Three times a day (TID) | ORAL | Status: DC | PRN
  Administered 2023-05-27: 2 mg via ORAL
  Filled 2023-05-26: qty 1

## 2023-05-26 NOTE — ED Notes (Signed)
This tech obtained vital signs on pt. No other needs at the moment.

## 2023-05-26 NOTE — ED Notes (Signed)
Nathan Bennett,NP notified via secure chat of the patients escalating behavior. NP to put in medication orders

## 2023-05-26 NOTE — ED Provider Notes (Signed)
[facility] Provider Note    Event Date/Time   First MD Initiated Contact with Patient 05/26/23 1042     (approximate)   History   Chief Complaint: Paranoid   HPI  Nathan [name] is a 37 y.o. male with a history of polysubstance abuse, bipolar disorder who was brought to the ED voluntarily by police due to paranoia over the past few days.  He reports being worried his girlfriend is trying to have him killed.  He is upset and afraid.     Physical Exam   Triage Vital Signs: ED Triage Vitals  Enc Vitals Group     BP 05/26/23 0935 129/85     Pulse Rate 05/26/23 0935 100     Resp 05/26/23 0935 20     Temp 05/26/23 0935 98 F (36.7 C)     Temp Source 05/26/23 0935 Oral     SpO2 05/26/23 0935 100 %     Weight 05/26/23 0936 123 lb 7.3 oz (56 kg)     Height 05/26/23 0936 5\' 9"  (1.753 m)     Head Circumference --      Peak Flow --      Pain Score 05/26/23 0936 7     Pain Loc --      Pain Edu? --      Excl. in GC? --     Most recent vital signs: Vitals:   05/26/23 0935  BP: 129/85  Pulse: 100  Resp: 20  Temp: 98 F (36.7 C)  SpO2: 100%    General: Awake, no distress. CV:  Good peripheral perfusion.  Resp:  Normal effort.  Abd:  No distention.  Other:  No wounds.  Tangential thought process.  Paranoid   ED Results / Procedures / Treatments   Labs (all labs ordered are listed, but only abnormal results are displayed) Labs Reviewed  COMPREHENSIVE METABOLIC PANEL - Abnormal; Notable for the following components:      Result Value   Total Protein 8.3 (*)    AST 44 (*)    ALT 48 (*)    Total Bilirubin 1.5 (*)    All other components within normal limits  URINE DRUG SCREEN, QUALITATIVE ([facility] ONLY) - Abnormal; Notable for the following components:   Amphetamines, Ur Screen POSITIVE (*)    Cannabinoid 50 Ng, Ur [place] POSITIVE (*)    All other components within normal limits  ETHANOL  CBC      EKG    RADIOLOGY    PROCEDURES:  Procedures   MEDICATIONS ORDERED IN ED: Medications  ibuprofen (ADVIL) tablet 600 mg (has no administration in time range)  ondansetron (ZOFRAN) tablet 4 mg (has no administration in time range)  alum & mag hydroxide-simeth (MAALOX/MYLANTA) 200-200-20 MG/5ML suspension 30 mL (has no administration in time range)     IMPRESSION / MDM / ASSESSMENT AND PLAN / ED COURSE  I reviewed the triage vital signs and the nursing notes.  Patient's presentation is most consistent with severe exacerbation of chronic illness.  Patient presents with suspected decompensation of chronic psychiatric disorder versus substance induced psychosis.  Possible electrolyte abnormality or alcohol intoxication.  Not an imminent danger to himself or others.  Lab panel unremarkable except for positive for amphetamines and cannabinoids.  Will consult psychiatry.  The patient has been placed in psychiatric observation due to the need to provide a safe environment for the patient while obtaining psychiatric consultation and evaluation, as well as ongoing medical and medication  management to treat the patient's condition.  The patient has not been placed under full IVC at this time.      FINAL CLINICAL IMPRESSION(S) / ED DIAGNOSES   Final diagnoses:  Paranoia (HCC)     Rx / DC Orders   ED Discharge Orders     None        Note:  This document was prepared using Dragon voice recognition software and may include unintentional dictation errors.   Sharman Cheek, MD 05/26/23 [phone]

## 2023-05-26 NOTE — Consult Note (Addendum)
[facility] Face-to-Face Psychiatry Consult   Reason for Consult:  Paranoia and HI  Referring Physician:  Sharman Cheek, MD Patient Identification: Nathan [name] MRN:  [id number] Principal Diagnosis: Substance induced mood disorder (HCC) Diagnosis:  Principal Problem:   Substance induced mood disorder (HCC) Active Problems:   Cannabis use disorder, moderate, in controlled environment (HCC)   Amphetamine use   Total Time spent with patient: 30 minutes  Subjective:   Nathan [name] is a 37 y.o. male patient admitted with "my baby mama put a hit out on me" .  HPI:  Patient seen and chart reviewed. Nathan [name], a 37 year-old Caucasian male, with a history significant for amphetamine and Cannabinoid use presented to the emergency department with complaints of paranoia and homicidal ideation. Of note patient presented to the emergency department 2 days ago with complaints of the same symptoms and was recommended for psychiatric inpatient admission. Patient reportedly verbalized feeling better after having rested and requested to go home. He was discharged home and given strict return precautions to which he reportedly verbalized understanding.   Patient observed sitting up on side of the bed on approach. He endorses paranoia with the belief his ex-girlfriend is trying to have him killed. He endorses homicidal ideation towards anyone who attempts to hurt him. He says being homeless makes his symptoms worse. He acknowledges amphetamine "Ice" use 1 -2 times a week (last used 2 days ago) and daily marijuana use.  UDS + Amphetamines and THC. BAL unremarkable. Patient reports he is homeless and unemployed.   During the evaluation, patient lying in bed. He is alert and oriented x 4, maintains good eye contact. Speech clear and coherent; answers questions appropriately. He appears anxious though cooperative. He denies active suicidal ideation, auditory or visual hallucinations. He does not appear  to be responding to internal or external hallucinations during the encounter. He reports unsatisfactory appetite and sleep due to homelessness.   ADDENDUM: 1520 -This author accompanied by TTS informed patient of disposition for discharge with recommendation for intensive outpatient and resources for substance abuse treatment facilities. Patient became visibly upset and stated he would take his own life before he allowed someone else to harm him. This author and TTS attempts to calm patient unsuccessful. Patient continues to endorse paranoia and persecutory delusions that someone is trying to kill him. Patient initially denied suicidal ideation. After being told of disposition, patient endorses current suicidal ideation with a plan to kill himself. Inpatient psychiatric admission recommended for safety and stabilization.    Past Psychiatric History: Charted history of anxiety, Bipolar 1 disorder, depression, polysubstance abuse, alcohol abuse.  Patient reports he used to see Dr. Carman Ching at [facility] "long time ago."    Risk to Self:   Risk to Others:    Prior Inpatient Therapy:   Prior Outpatient Therapy:  [facility]   Past Medical History:  Past Medical History:  Diagnosis Date   Anxiety    Bipolar 1 disorder (HCC)    Chronic headache    Depression 03/30/2020   ETOH abuse 03/30/2020   Hep C w/o coma, chronic (HCC)    Heroin addiction (HCC)    Horseshoe kidney    Marijuana smoker 03/30/2020    Past Surgical History:  Procedure Laterality Date   TONSILLECTOMY     Family History:  Family History  Problem Relation Age of Onset   Hypertension Mother    Diabetes Mother    Hypertension Father    Heart failure Father  Diabetes Father    Family Psychiatric  History: No pertinent charted psychiatric history.  Social History:  Social History   Substance and Sexual Activity  Alcohol Use Not Currently   Alcohol/week: 24.0 standard drinks of alcohol   Types: 24 Cans of beer per week   Comment: 4  gallons a day- pt reports more than this currently     Social History   Substance and Sexual Activity  Drug Use Yes   Types: Marijuana, Cocaine, Benzodiazepines   Comment: Heroin addiction- denies current drug use    Social History   Socioeconomic History   Marital status: Single    Spouse name: Not on file   Number of children: 1   Years of education: 11   Highest education level: 11th grade  Occupational History   Not on file  Tobacco Use   Smoking status: Every Day    Packs/day: 1    Types: Cigarettes   Smokeless tobacco: Current    Types: Chew  Vaping Use   Vaping Use: Every day  Substance and Sexual Activity   Alcohol use: Not Currently    Alcohol/week: 24.0 standard drinks of alcohol    Types: 24 Cans of beer per week    Comment: 4 gallons a day- pt reports more than this currently   Drug use: Yes    Types: Marijuana, Cocaine, Benzodiazepines    Comment: Heroin addiction- denies current drug use   Sexual activity: Not Currently  Other Topics Concern   Not on file  Social History Narrative   Not on file   Social Determinants of Health   Financial Resource Strain: Not on file  Food Insecurity: Patient Declined (12/07/2022)   Hunger Vital Sign    Worried About Running Out of Food in the Last Year: Patient declined    Ran Out of Food in the Last Year: Patient declined  Transportation Needs: Patient Declined (12/07/2022)   PRAPARE - Administrator, Civil Service (Medical): Patient declined    Lack of Transportation (Non-Medical): Patient declined  Physical Activity: Not on file  Stress: Not on file  Social Connections: Not on file   Additional Social History:    Allergies:   Allergies  Allergen Reactions   Geodon [Ziprasidone Hcl] Other (See Comments)    Seizures   Valproic Acid    Levofloxacin Itching    Labs:  Results for orders placed or performed during the hospital encounter of 05/26/23 (from the past 48 hour(s))  Comprehensive  metabolic panel     Status: Abnormal   Collection Time: 05/26/23  9:40 AM  Result Value Ref Range   Sodium 135 135 - 145 mmol/L   Potassium 4.6 3.5 - 5.1 mmol/L   Chloride 102 98 - 111 mmol/L   CO2 24 22 - 32 mmol/L   Glucose, Bld 97 70 - 99 mg/dL    Comment: Glucose reference range applies only to samples taken after fasting for at least 8 hours.   BUN 17 6 - 20 mg/dL   Creatinine, Ser 1.61 0.61 - 1.24 mg/dL   Calcium 9.6 8.9 - 09.6 mg/dL   Total Protein 8.3 (H) 6.5 - 8.1 g/dL   Albumin 4.4 3.5 - 5.0 g/dL   AST 44 (H) 15 - 41 U/L   ALT 48 (H) 0 - 44 U/L   Alkaline Phosphatase 83 38 - 126 U/L   Total Bilirubin 1.5 (H) 0.3 - 1.2 mg/dL   GFR, Estimated >04 >54 mL/min  Comment: (NOTE) Calculated using the CKD-EPI Creatinine Equation (2021)    Anion gap 9 5 - 15    Comment: Performed at [facility], [address] [place], [place] [address]  Ethanol     Status: None   Collection Time: 05/26/23  9:40 AM  Result Value Ref Range   Alcohol, Ethyl (B) <10 <10 mg/dL    Comment: (NOTE) Lowest detectable limit for serum alcohol is 10 mg/dL.  For medical purposes only. Performed at [facility], [address] [place], [place] [address]   cbc     Status: None   Collection Time: 05/26/23  9:40 AM  Result Value Ref Range   WBC 9.0 4.0 - 10.5 K/uL   RBC 4.95 4.22 - 5.81 MIL/uL   Hemoglobin 15.2 13.0 - 17.0 g/dL   HCT 08.6 57.8 - 46.9 %   MCV 94.1 80.0 - 100.0 fL   MCH 30.7 26.0 - 34.0 pg   MCHC 32.6 30.0 - 36.0 g/dL   RDW 62.9 52.8 - 41.3 %   Platelets 293 150 - 400 K/uL   nRBC 0.0 0.0 - 0.2 %    Comment: Performed at [facility], [address], [place], [place] [address]  Urine Drug Screen, Qualitative     Status: Abnormal   Collection Time: 05/26/23 12:56 PM  Result Value Ref Range   Tricyclic, Ur Screen NONE DETECTED NONE DETECTED   Amphetamines, Ur Screen POSITIVE (A) NONE DETECTED   MDMA (Ecstasy)Ur Screen NONE DETECTED NONE DETECTED    Cocaine Metabolite,Ur [place] NONE DETECTED NONE DETECTED   Opiate, Ur Screen NONE DETECTED NONE DETECTED   Phencyclidine (PCP) Ur S NONE DETECTED NONE DETECTED   Cannabinoid 50 Ng, Ur [place] POSITIVE (A) NONE DETECTED   Barbiturates, Ur Screen NONE DETECTED NONE DETECTED   Benzodiazepine, Ur Scrn NONE DETECTED NONE DETECTED   Methadone Scn, Ur NONE DETECTED NONE DETECTED    Comment: (NOTE) Tricyclics + metabolites, urine    Cutoff 1000 ng/mL Amphetamines + metabolites, urine  Cutoff 1000 ng/mL MDMA (Ecstasy), urine              Cutoff 500 ng/mL Cocaine Metabolite, urine          Cutoff 300 ng/mL Opiate + metabolites, urine        Cutoff 300 ng/mL Phencyclidine (PCP), urine         Cutoff 25 ng/mL Cannabinoid, urine                 Cutoff 50 ng/mL Barbiturates + metabolites, urine  Cutoff 200 ng/mL Benzodiazepine, urine              Cutoff 200 ng/mL Methadone, urine                   Cutoff 300 ng/mL  The urine drug screen provides only a preliminary, unconfirmed analytical test result and should not be used for non-medical purposes. Clinical consideration and professional judgment should be applied to any positive drug screen result due to possible interfering substances. A more specific alternate chemical method must be used in order to obtain a confirmed analytical result. Gas chromatography / mass spectrometry (GC/MS) is the preferred confirm atory method. Performed at [facility], [address] [place], [place] [address]     Current Facility-Administered Medications  Medication Dose Route Frequency Provider Last Rate Last Admin   alum & mag hydroxide-simeth (MAALOX/MYLANTA) 200-200-20 MG/5ML suspension 30 mL  30 mL Oral Q6H PRN  Sharman Cheek, MD       ibuprofen (ADVIL) tablet 600 mg  600 mg Oral Q8H PRN Sharman Cheek, MD       ondansetron [facility]) tablet 4 mg  4 mg Oral Q8H PRN Sharman Cheek, MD       No current outpatient medications on file.     Musculoskeletal: Strength & Muscle Tone: within normal limits Gait & Station: normal Patient leans: N/A            Psychiatric Specialty Exam:  Presentation  General Appearance:  Disheveled  Eye Contact: Good  Speech: Clear and Coherent  Speech Volume: Normal  Handedness: Right   Mood and Affect  Mood: Anxious  Affect: Congruent   Thought Process  Thought Processes: Coherent  Descriptions of Associations:Intact  Orientation:Full (Time, Place and Person)  Thought Content:Paranoid Ideation (Patient reports his ex-girlfriend is trying to have him killed.)  History of Schizophrenia/Schizoaffective disorder:Yes  Duration of Psychotic Symptoms:Less than six months  Hallucinations:Hallucinations: None  Ideas of Reference:Paranoia; Percusatory  Suicidal Thoughts:Suicidal Thoughts: No  Homicidal Thoughts:Homicidal Thoughts: Yes, Passive (Patient reports HI towards anyone who attempts to hurt him.) HI Passive Intent and/or Plan: Without Intent; Without Plan   Sensorium  Memory: Immediate Good; Recent Good; Remote Good  Judgment: Fair  Insight: Good   Executive Functions  Concentration: Good  Attention Span: Good  Recall: Good  Fund of Knowledge: Fair  Language: Good   Psychomotor Activity  Psychomotor Activity:Psychomotor Activity: Normal   Assets  Assets: Communication Skills; Desire for Improvement; Physical Health; Resilience   Sleep  Sleep:Sleep: Poor   Physical Exam: Physical Exam Vitals and nursing note reviewed.  HENT:     Head: Normocephalic.     Nose: Nose normal.  Pulmonary:     Effort: Pulmonary effort is normal.  Musculoskeletal:        General: Normal range of motion.     Cervical back: Normal range of motion.  Neurological:     Mental Status: He is alert and oriented to person, place, and time.  Psychiatric:        Attention and Perception: He does not perceive auditory or visual  hallucinations.        Mood and Affect: Mood is anxious. Affect is tearful.        Speech: Speech normal.        Behavior: Behavior is cooperative.        Thought Content: Thought content is paranoid (Reports his children's mother put a hit out on him.).        Cognition and Memory: Cognition and memory normal.    ROS Blood pressure 129/85, pulse 100, temperature 98 F (36.7 C), temperature source Oral, resp. rate 20, height 5\' 9"  (1.753 m), weight 56 kg, SpO2 100 %. Body mass index is 18.23 kg/m.  Treatment Plan Summary: Plan 37 y.o. male presented voluntarily to the ER with complaints of paranoia and HI. Patient was informed of discharge with recommendation for intensive outpatient and resources for substance abuse treatment facilities. Patient became visibly upset and endorsed SI with a plan to kill himself. He continues to endorse persecutory delusion someone is trying to kill him. Inpatient psychiatric hospitalization recommended for safety and stabilization. Plan reviewed with ED physician, Neha Ray.    Disposition:  Recommend psychiatric Inpatient admission when medically cleared. Supportive therapy provided about ongoing stressors.  Norma Fredrickson, NP 05/26/2023 2:50 PM

## 2023-05-26 NOTE — ED Notes (Signed)
Patient yelling from room. "I know what you are trying to do." This Rn spoke with patient, and pt states that he feels that security is trying to kill him. Patient states that his "girlfriend sent them here to kill him." Pt also states that they are "not allowed in his room". Pt continues to yell out in room.

## 2023-05-26 NOTE — ED Notes (Signed)
VOLUNTARY recommend inpt psych admit/consult complete

## 2023-05-26 NOTE — ED Notes (Signed)
Patient tearful, stating that he is experiencing extreme paranoia and doesn't feel safe anywhere. He states that his son's mother is trying to have him killed, and that if he was out in the world, he might harm someone to  keep himself safe. Pt requesting to use a urinal while in room, as he doesn't feel safe coming out of the room to use the bathroom.

## 2023-05-26 NOTE — ED Notes (Signed)
Patient dressed into hospital appropriate scrubs. Belongings placed in bag and labeled appropriately.  Belongings include- 2 black boots Black underwear [employer]- with no money 2 vapes  Pair of shorts T shirt

## 2023-05-26 NOTE — ED Notes (Signed)
Pt given lunch

## 2023-05-26 NOTE — ED Notes (Signed)
Pt received meal tray at this time 

## 2023-05-26 NOTE — ED Triage Notes (Signed)
Patient brought in voluntarily by police- patient states he was here 2 days ago and things have worsened. Patient crying in triage and states he is worried his girlfriend is trying to have him killed. Patient reports not being able to eat or drink due to his feelings. C/o headache and bilateral feet pain due to "running around constantly." Denies any SI however states if he felt like he was in danger he would hurt someone.

## 2023-05-26 NOTE — ED Notes (Signed)
Just spoke with patient,whom is tearful and upset because he was initially told that he would be discharged. Pt states that he would take his won life before he would allow someone else the opportunity to take his life. Pt states that it's not fair for someone to d that to him. Pt requesting more to eat, pt given an extra lunch tray that was brought up.

## 2023-05-26 NOTE — BH Assessment (Addendum)
Comprehensive Clinical Assessment (CCA) Screening, Triage and Referral Note  05/26/2023 [facility] [id number]  Chief Complaint:  Chief Complaint  Patient presents with   Paranoid   Visit Diagnosis: Substance Induced Psychosis  Nathan [name] "Nathan [name]" is 37 year old male who presents to the ER stating he is paranoid. He reports of having thoughts that his former girlfriend "put a hit out on me." He further reports of having trouble sleeping due to the fear of someone killing him in his sleep. Patient admits to the use of "ice (methamphetamine)" and it is twice a week. He states, he knows the difference from a hallucination from "ice" and what he is currently experiencing. During the interview, the patient was calm, cooperative and pleasant. He was able to provide appropriate answers to the questions. He denies SI and AV/H. He reports of having HI and it's towards the people who are trying to kill him but he doesn't know who they are. After talking with the patient again, to give final disposition, he continues to voice paranoia and now SI.   Patient Reported Information How did you hear about [place]? Self  What Is the Reason for Your Visit/Call Today? Believes his girlfriend, put a "hit" on him.  How Long Has This Been Causing You Problems? <Week  What Do You Feel Would Help You the Most Today? Alcohol or Drug Use Treatment; Treatment for Depression or other mood problem   Have You Recently Had Any Thoughts About Hurting Yourself? No  Are You Planning to Commit Suicide/Harm Yourself At This time? No   Have you Recently Had Thoughts About Hurting Someone Nathan [name]? Yes  Are You Planning to Harm Someone at This Time? No  Explanation: n/a   Have You Used Any Alcohol or Drugs in the Past 24 Hours? Yes  How Long Ago Did You Use Drugs or Alcohol? No data recorded What Did You Use and How Much? Cannabis and Methamphetamine. Unknown amount and last use.   Do You Currently Have a  Therapist/Psychiatrist? No  Name of Therapist/Psychiatrist: n/a   Have You Been Recently Discharged From Any Office Practice or Programs? No  Explanation of Discharge From Practice/Program: n/a    CCA Screening Triage Referral Assessment Type of Contact: Face-to-Face  Telemedicine Service Delivery:   Is this Initial or Reassessment?   Date Telepsych consult ordered in [facility]:    Time Telepsych consult ordered in [facility]:    Location of Assessment: [facility] ED  Provider Location: [facility] ED    Collateral Involvement: No collateral involved   Does Patient Have a Court Appointed Legal Guardian? No data recorded Name and Contact of Legal Guardian: No data recorded If Minor and Not Living with Parent(s), Who has Custody? n/a  Is CPS involved or ever been involved? Never  Is APS involved or ever been involved? Never   Patient Determined To Be At Risk for Harm To Self or Others Based on Review of Patient Reported Information or Presenting Complaint? No  Method: No Plan  Availability of Means: No access or NA  Intent: Vague intent or NA  Notification Required: No need or identified person  Additional Information for Danger to Others Potential: Active psychosis  Additional Comments for Danger to Others Potential: n/a  Are There Guns or Other Weapons in Your Home? No  Types of Guns/Weapons: Pt reports no guns or weapons in his possesson  Are These Weapons Safely Secured?  No  Who Could Verify You Are Able To Have These Secured: n/a  Do You Have any Outstanding Charges, Pending Court Dates, Parole/Probation? No  Contacted To Inform of Risk of Harm To Self or Others: Other: Comment (No need to notify)   Does Patient Present under Involuntary Commitment? No    County of Residence: [place]   Patient Currently Receiving the Following Services: Not Receiving Services   Determination of Need: Emergent (2 hours)   Options For Referral: Inpatient  Hospitalization   Discharge Disposition:    Lilyan Gilford MS, LCAS, [facility], [facility] Therapeutic Triage Specialist 05/26/2023 12:08 PM

## 2023-05-26 NOTE — ED Notes (Signed)
This tech obtained vital signs on pt.  

## 2023-05-26 NOTE — ED Notes (Signed)
Patient states that is allergic to haldol. Pt to receive benadryl and ativan

## 2023-05-26 NOTE — BH Assessment (Signed)
Psych NP and TTS went to the patient room, attempting to explain to him, he would be better served with substance treatment and pursing that option. He focused on the part of the conversation of been discharged. After trying to explain to him the process, he continued to focus on discharging from the ER and not the part about seeking substance treatment in a facility and/or outpatient setting. Patient then became visibly upset and stated he would kill himself before someone killed him.  Prior to the part of the conversation about discharged, the patient was receptive to substance abuse treatment and agreed to it. He was calm, cooperative and able to engage in the conversation without any difficulty.

## 2023-05-26 NOTE — BH Assessment (Signed)
Per [facility] Tresa Endo S.), patient to be referred out of system.  Referral information for Psychiatric Hospitalization faxed to;   [facility] ([phone]- [phone]), No bed available beds.  [facility] [phone]),   [address] [phone]),  [place] [phone]--- [phone]--- [phone]--- [phone])  [address] [phone]),   [facility] [phone] -or- [phone]),   [facility] [phone]),  [place] [phone])  [facility] [phone]).  [facility] [phone])

## 2023-05-27 NOTE — ED Notes (Signed)
[facility] staff at the bedside to visit with pt.

## 2023-05-27 NOTE — ED Notes (Signed)
vol/recommend psychiatric inpatient admit.Marland Kitchen

## 2023-05-27 NOTE — ED Notes (Signed)
Hospital meal provided, pt tolerated w/o complaints.  Waste discarded appropriately.  

## 2023-05-27 NOTE — ED Notes (Signed)
Phone returned.

## 2023-05-27 NOTE — ED Notes (Addendum)
Patient having extreme episode of paranoia at this time. Pt is aggitated and paranoid about security staff member and other patient next to him in the hallway wanting to come in his room and kill him. Patient escalating in paranoia and requesting medications be given if possible. Oral benadryl and ativan given to patient at this time.

## 2023-05-27 NOTE — BH Assessment (Addendum)
Referral checks:    [facility] ([phone]-or- [phone]), No bed available beds.   [facility] [phone]), Denied due to no insurance   [facility] [phone]), No answer, voicemail was left to return phone call   [facility] [phone]--- [phone]--- [phone]--- [phone]) No answer   [facility] [phone]), No answer   [facility] [phone] -[phone]), Denied due to no insurance   [facility] [phone]),   [place] [phone])   [facility] [phone]).   [facility] [phone])

## 2023-05-27 NOTE — ED Notes (Signed)
VOL/Pending Placement 

## 2023-05-27 NOTE — ED Notes (Signed)
Pt heard in room screaming help repetitively. Pt apparently woke up suddenly and felt he was not able to breath. When this nurse entered room pt was running around room and dove onto floor and spun around. Pt states he was afraid for his life and felt disoriented. Pt reassured of safety and pt returned to bed. Trash cleaned from room and pt provided with water. States he will be attempting to return to sleep for the night.

## 2023-05-27 NOTE — ED Notes (Signed)
Pt given nighttime snack. 

## 2023-05-27 NOTE — ED Notes (Signed)
Given phone @ 1022

## 2023-05-27 NOTE — ED Notes (Signed)
Given  breakfast

## 2023-05-28 DIAGNOSIS — F15959 Other stimulant use, unspecified with stimulant-induced psychotic disorder, unspecified: Secondary | ICD-10-CM | POA: Diagnosis not present

## 2023-05-28 MED ORDER — OLANZAPINE 10 MG IM SOLR: 10.0000 mg | Freq: Two times a day (BID) | INTRAMUSCULAR | Status: DC

## 2023-05-28 MED ORDER — OLANZAPINE 10 MG PO TABS
10.0000 mg | ORAL_TABLET | Freq: Two times a day (BID) | ORAL | Status: DC
  Administered 2023-05-28 – 2023-05-29 (×3): 10 mg via ORAL
  Filled 2023-05-28 (×3): qty 1

## 2023-05-28 NOTE — ED Provider Notes (Signed)
Emergency Medicine Observation Re-evaluation Note  Nathan [name] is a 37 y.o. male, seen on rounds today.  Pt initially presented to the ED for complaints of Paranoid Currently, the patient is resting, voices no medical complaints.  Physical Exam  BP 110/65 (BP Location: Right Arm)   Pulse 68   Temp 98 F (36.7 C) (Oral)   Resp 18   Ht 5\' 9"  (1.753 m)   Wt 56 kg   SpO2 97%   BMI 18.23 kg/m  Physical Exam General: Resting in no acute distress Cardiac: No cyanosis Lungs: Equal rise and fall Psych: Not agitated  ED Course / MDM  EKG:   I have reviewed the labs performed to date as well as medications administered while in observation.  Recent changes in the last 24 hours include no events overnight.  Plan  Current plan is for psychiatric disposition.    Irean Hong, MD 05/28/23 [phone]

## 2023-05-28 NOTE — ED Notes (Signed)
Patient given breakfast tray.

## 2023-05-28 NOTE — ED Notes (Signed)
Pt asking to use the phone, phone provided.

## 2023-05-28 NOTE — ED Notes (Signed)
This tech obtained vital signs on pt.  

## 2023-05-28 NOTE — ED Notes (Signed)
Pt given lunch tray.

## 2023-05-28 NOTE — ED Notes (Signed)
Patient saw another patient through window in his room door and became paranoid and began shouting that nobody should be trying to get into his room.  Patient reassured that he would be safe.

## 2023-05-28 NOTE — ED Notes (Signed)
Patient is vol pending placement 

## 2023-05-28 NOTE — Consult Note (Signed)
[facility] Face-to-Face Psychiatry Consult   Reason for Consult:  Paranoia and HI  Referring Physician:  Sharman Cheek, MD Patient Identification: Nathan [name] MRN:  [id number] Principal Diagnosis: Methamphetamine-induced psychotic disorder [facility]) Diagnosis:  Principal Problem:   Methamphetamine-induced psychotic disorder (HCC) Active Problems:   Cannabis use disorder, moderate, in controlled environment (HCC)   Amphetamine use   Total Time spent with patient: 15 minutes  Subjective:   Nathan [name] is a 37 y.o. male patient admitted with methamphetamine abuse with paranoia that someone is trying to kill him.  Today, he continues to state, "They are coming to kill me".  Irritable on assessment and stated no one believes him.  Paranoid delusions continue, medications started as inpatient hospitalization sought.   HPI on admission per Christal Bennett:  Patient seen and chart reviewed. Nathan [name], a 37 year-old Caucasian male, with a history significant for amphetamine and Cannabinoid use presented to the emergency department with complaints of paranoia and homicidal ideation. Of note patient presented to the emergency department 2 days ago with complaints of the same symptoms and was recommended for psychiatric inpatient admission. Patient reportedly verbalized feeling better after having rested and requested to go home. He was discharged home and given strict return precautions to which he reportedly verbalized understanding.   Patient observed sitting up on side of the bed on approach. He endorses paranoia with the belief his ex-girlfriend is trying to have him killed. He endorses homicidal ideation towards anyone who attempts to hurt him. He says being homeless makes his symptoms worse. He acknowledges amphetamine "Ice" use 1 -2 times a week (last used 2 days ago) and daily marijuana use.  UDS + Amphetamines and THC. BAL unremarkable. Patient reports he is homeless and  unemployed.   During the evaluation, patient lying in bed. He is alert and oriented x 4, maintains good eye contact. Speech clear and coherent; answers questions appropriately. He appears anxious though cooperative. He denies active suicidal ideation, auditory or visual hallucinations. He does not appear to be responding to internal or external hallucinations during the encounter. He reports unsatisfactory appetite and sleep due to homelessness.   ADDENDUM: 1520 -This author accompanied by TTS informed patient of disposition for discharge with recommendation for intensive outpatient and resources for substance abuse treatment facilities. Patient became visibly upset and stated he would take his own life before he allowed someone else to harm him. This author and TTS attempts to calm patient unsuccessful. Patient continues to endorse paranoia and persecutory delusions that someone is trying to kill him. Patient initially denied suicidal ideation. After being told of disposition, patient endorses current suicidal ideation with a plan to kill himself. Inpatient psychiatric admission recommended for safety and stabilization.   Past Psychiatric History: Charted history of anxiety, Bipolar 1 disorder, depression, polysubstance abuse, alcohol abuse.  Patient reports he used to see Dr. Carman Ching at [facility] "long time ago."     Past Medical History:  Past Medical History:  Diagnosis Date   Anxiety    Bipolar 1 disorder (HCC)    Chronic headache    Depression 03/30/2020   ETOH abuse 03/30/2020   Hep C w/o coma, chronic (HCC)    Heroin addiction (HCC)    Horseshoe kidney    Marijuana smoker 03/30/2020    Past Surgical History:  Procedure Laterality Date   TONSILLECTOMY     Family History:  Family History  Problem Relation Age of Onset   Hypertension Mother    Diabetes Mother  Hypertension Father    Heart failure Father    Diabetes Father    Family Psychiatric  History: No pertinent charted  psychiatric history.  Social History:  Social History   Substance and Sexual Activity  Alcohol Use Not Currently   Alcohol/week: 24.0 standard drinks of alcohol   Types: 24 Cans of beer per week   Comment: 4 gallons a day- pt reports more than this currently     Social History   Substance and Sexual Activity  Drug Use Yes   Types: Marijuana, Cocaine, Benzodiazepines   Comment: Heroin addiction- denies current drug use    Social History   Socioeconomic History   Marital status: Single    Spouse name: Not on file   Number of children: 1   Years of education: 11   Highest education level: 11th grade  Occupational History   Not on file  Tobacco Use   Smoking status: Every Day    Packs/day: 1    Types: Cigarettes   Smokeless tobacco: Current    Types: Chew  Vaping Use   Vaping Use: Every day  Substance and Sexual Activity   Alcohol use: Not Currently    Alcohol/week: 24.0 standard drinks of alcohol    Types: 24 Cans of beer per week    Comment: 4 gallons a day- pt reports more than this currently   Drug use: Yes    Types: Marijuana, Cocaine, Benzodiazepines    Comment: Heroin addiction- denies current drug use   Sexual activity: Not Currently  Other Topics Concern   Not on file  Social History Narrative   Not on file   Social Determinants of Health   Financial Resource Strain: Not on file  Food Insecurity: Patient Declined (12/07/2022)   Hunger Vital Sign    Worried About Running Out of Food in the Last Year: Patient declined    Ran Out of Food in the Last Year: Patient declined  Transportation Needs: Patient Declined (12/07/2022)   PRAPARE - Administrator, Civil Service (Medical): Patient declined    Lack of Transportation (Non-Medical): Patient declined  Physical Activity: Not on file  Stress: Not on file  Social Connections: Not on file   Additional Social History:    Allergies:   Allergies  Allergen Reactions   Geodon [Ziprasidone Hcl]  Other (See Comments)    Seizures   Valproic Acid    Levofloxacin Itching    Labs:  No results found for this or any previous visit (from the past 48 hour(s)).   Current Facility-Administered Medications  Medication Dose Route Frequency Provider Last Rate Last Admin   alum & mag hydroxide-simeth (MAALOX/MYLANTA) 200-200-20 MG/5ML suspension 30 mL  30 mL Oral Q6H PRN Sharman Cheek, MD       diphenhydrAMINE (BENADRYL) capsule 50 mg  50 mg Oral TID PRN Bennett, Christal H, NP   50 mg at 05/27/23 1738   Or   diphenhydrAMINE (BENADRYL) injection 50 mg  50 mg Intramuscular TID PRN Bennett, Christal H, NP       ibuprofen (ADVIL) tablet 600 mg  600 mg Oral Q8H PRN Sharman Cheek, MD       OLANZapine (ZYPREXA) tablet 10 mg  10 mg Oral BID Charm Rings, NP   10 mg at 05/28/23 0930   Or   OLANZapine (ZYPREXA) injection 10 mg  10 mg Intramuscular BID Charm Rings, NP       ondansetron [facility]) tablet 4 mg  4 mg  Oral Q8H PRN Sharman Cheek, MD       No current outpatient medications on file.    Musculoskeletal: Strength & Muscle Tone: within normal limits Gait & Station: normal Patient leans: N/A  Psychiatric Specialty Exam: Physical Exam  Review of Systems  Blood pressure (!) 98/53, pulse (!) 56, temperature 97.9 F (36.6 C), temperature source Oral, resp. rate 17, height 5\' 9"  (1.753 m), weight 56 kg, SpO2 94 %.Body mass index is 18.23 kg/m.  General Appearance: Casual  Eye Contact:  Fair  Speech:  Normal Rate  Volume:  Normal  Mood:  Anxious and Irritable  Affect:  Congruent  Thought Process:  Coherent  Orientation:  Full (Time, Place, and Person)  Thought Content:  Delusions and Paranoid Ideation  Suicidal Thoughts:  No  Homicidal Thoughts:  No  Memory:  Immediate;   Fair Recent;   Fair Remote;   Fair  Judgement:  Poor  Insight:  Fair  Psychomotor Activity:  Normal  Concentration:  Concentration: Fair and Attention Span: Fair  Recall:  [employer] of  Knowledge:  Fair  Language:  Good  Akathisia:  No  Handed:  Right  AIMS (if indicated):     Assets:  Leisure Time Physical Health Resilience  ADL's:  Intact  Cognition:  WNL  Sleep:         Physical Exam: Physical Exam Vitals and nursing note reviewed.  HENT:     Head: Normocephalic.     Nose: Nose normal.  Pulmonary:     Effort: Pulmonary effort is normal.  Musculoskeletal:        General: Normal range of motion.     Cervical back: Normal range of motion.  Neurological:     Mental Status: He is alert and oriented to person, place, and time.  Psychiatric:        Attention and Perception: He does not perceive auditory or visual hallucinations.        Mood and Affect: Mood is anxious. Affect is tearful.        Speech: Speech normal.        Behavior: Behavior is cooperative.        Thought Content: Thought content is paranoid (Reports his children's mother put a hit out on him.).        Cognition and Memory: Cognition and memory normal.    ROS Blood pressure (!) 98/53, pulse (!) 56, temperature 97.9 F (36.6 C), temperature source Oral, resp. rate 17, height 5\' 9"  (1.753 m), weight 56 kg, SpO2 94 %. Body mass index is 18.23 kg/m.  Treatment Plan Summary: Methamphetamine induced psychosis with delusions: Zyprexa 10 mg BID started po or IM  Disposition:  Recommend psychiatric Inpatient admission when medically cleared. Supportive therapy provided about ongoing stressors.  Nanine Means, NP 05/28/2023 12:59 PM

## 2023-05-29 DIAGNOSIS — F15959 Other stimulant use, unspecified with stimulant-induced psychotic disorder, unspecified: Secondary | ICD-10-CM | POA: Diagnosis not present

## 2023-05-29 MED ORDER — BENZTROPINE MESYLATE 1 MG PO TABS
1.0000 mg | ORAL_TABLET | Freq: Every day | ORAL | Status: DC
  Filled 2023-05-29: qty 1

## 2023-05-29 MED ORDER — HALOPERIDOL 5 MG PO TABS
5.0000 mg | ORAL_TABLET | Freq: Two times a day (BID) | ORAL | Status: DC
  Filled 2023-05-29: qty 1

## 2023-05-29 NOTE — ED Provider Notes (Signed)
-----------------------------------------   11:00 AM on 05/29/2023 ----------------------------------------- Patient now requesting to be discharged from the hospital.  Per psychiatry, he seems to be improved today and less paranoid, does not represent an acute threat to himself or others.  He is currently voluntary and there does not appear to be an indication for IVC.  Patient appropriate for discharge home with [facility] follow-up per psychiatry.   Chesley Noon, MD 05/29/23 1100

## 2023-05-29 NOTE — ED Notes (Signed)
VOL/pending placement 

## 2023-05-29 NOTE — BH Assessment (Signed)
Referral information for Psychiatric Hospitalization RE-faxed to;   [facility] [phone]),    [place] [phone]--- [phone]--- [phone]--- [phone])    [facility] [phone]),    [facility] [phone]),   [place] [phone])   [facility] [phone]).   [facility] [phone])  [facility] [phone]), Denied due to no insurance [facility] [phone]), Denied due to no insurance [facility] [phone]- [phone]), No bed available beds.

## 2023-05-29 NOTE — Consult Note (Cosign Needed Addendum)
[facility] Face-to-Face Psychiatry Consult   Reason for Consult:  Paranoia and HI  Referring Physician:  Sharman Cheek, MD Patient Identification: Nathan [name] MRN:  [id number] Principal Diagnosis: Methamphetamine-induced psychotic disorder [facility]) Diagnosis:  Principal Problem:   Methamphetamine-induced psychotic disorder (HCC) Active Problems:   Cannabis use disorder, moderate, in controlled environment (HCC)   Amphetamine use   Total Time spent with patient: 15 minutes  Subjective:   Nathan [name] is a 37 y.o. male patient admitted with methamphetamine abuse with paranoia that someone was trying to kill him.  Today, the client is less irritable today with an improvement in his paranoia.  He no longer thinks anyone is trying to kill him.  "I talked to my people and they say I can come back.  I don't think you guys are creatures anymore.  I stayed up too long and was smoking crystal meth."  He denies suicidal/homicidal ideations, hallucinations, homicidal ideations, and withdrawal symptoms.  Discussed outpatient follow up with substance abuse IOP at [facility] and he pulled out the [facility] card out of his pocket.  "I know Lorella Nimrod (the representative there), we go way back."  He plans on contacting him who will plan is care when contacted.  Psych cleared for discharge.  HPI on admission per Christal Bennett:  Patient seen and chart reviewed. Nathan [name], a 37 year-old Caucasian male, with a history significant for amphetamine and Cannabinoid use presented to the emergency department with complaints of paranoia and homicidal ideation. Of note patient presented to the emergency department 2 days ago with complaints of the same symptoms and was recommended for psychiatric inpatient admission. Patient reportedly verbalized feeling better after having rested and requested to go home. He was discharged home and given strict return precautions to which he reportedly verbalized understanding.   Patient  observed sitting up on side of the bed on approach. He endorses paranoia with the belief his ex-girlfriend is trying to have him killed. He endorses homicidal ideation towards anyone who attempts to hurt him. He says being homeless makes his symptoms worse. He acknowledges amphetamine "Ice" use 1 -2 times a week (last used 2 days ago) and daily marijuana use.  UDS + Amphetamines and THC. BAL unremarkable. Patient reports he is homeless and unemployed.   During the evaluation, patient lying in bed. He is alert and oriented x 4, maintains good eye contact. Speech clear and coherent; answers questions appropriately. He appears anxious though cooperative. He denies active suicidal ideation, auditory or visual hallucinations. He does not appear to be responding to internal or external hallucinations during the encounter. He reports unsatisfactory appetite and sleep due to homelessness.   ADDENDUM: 1520 -This author accompanied by TTS informed patient of disposition for discharge with recommendation for intensive outpatient and resources for substance abuse treatment facilities. Patient became visibly upset and stated he would take his own life before he allowed someone else to harm him. This author and TTS attempts to calm patient unsuccessful. Patient continues to endorse paranoia and persecutory delusions that someone is trying to kill him. Patient initially denied suicidal ideation. After being told of disposition, patient endorses current suicidal ideation with a plan to kill himself. Inpatient psychiatric admission recommended for safety and stabilization.   Past Psychiatric History: Charted history of anxiety, Bipolar 1 disorder, depression, polysubstance abuse, alcohol abuse.  Patient reports he used to see Dr. Carman Ching at [facility] "long time ago."     Past Medical History:  Past Medical History:  Diagnosis Date  Anxiety    Bipolar 1 disorder (HCC)    Chronic headache    Depression 03/30/2020   ETOH  abuse 03/30/2020   Hep C w/o coma, chronic (HCC)    Heroin addiction (HCC)    Horseshoe kidney    Marijuana smoker 03/30/2020    Past Surgical History:  Procedure Laterality Date   TONSILLECTOMY     Family History:  Family History  Problem Relation Age of Onset   Hypertension Mother    Diabetes Mother    Hypertension Father    Heart failure Father    Diabetes Father    Family Psychiatric  History: No pertinent charted psychiatric history.  Social History:  Social History   Substance and Sexual Activity  Alcohol Use Not Currently   Alcohol/week: 24.0 standard drinks of alcohol   Types: 24 Cans of beer per week   Comment: 4 gallons a day- pt reports more than this currently     Social History   Substance and Sexual Activity  Drug Use Yes   Types: Marijuana, Cocaine, Benzodiazepines   Comment: Heroin addiction- denies current drug use    Social History   Socioeconomic History   Marital status: Single    Spouse name: Not on file   Number of children: 1   Years of education: 11   Highest education level: 11th grade  Occupational History   Not on file  Tobacco Use   Smoking status: Every Day    Packs/day: 1    Types: Cigarettes   Smokeless tobacco: Current    Types: Chew  Vaping Use   Vaping Use: Every day  Substance and Sexual Activity   Alcohol use: Not Currently    Alcohol/week: 24.0 standard drinks of alcohol    Types: 24 Cans of beer per week    Comment: 4 gallons a day- pt reports more than this currently   Drug use: Yes    Types: Marijuana, Cocaine, Benzodiazepines    Comment: Heroin addiction- denies current drug use   Sexual activity: Not Currently  Other Topics Concern   Not on file  Social History Narrative   Not on file   Social Determinants of Health   Financial Resource Strain: Not on file  Food Insecurity: Patient Declined (12/07/2022)   Hunger Vital Sign    Worried About Running Out of Food in the Last Year: Patient declined    Ran  Out of Food in the Last Year: Patient declined  Transportation Needs: Patient Declined (12/07/2022)   PRAPARE - Administrator, Civil Service (Medical): Patient declined    Lack of Transportation (Non-Medical): Patient declined  Physical Activity: Not on file  Stress: Not on file  Social Connections: Not on file   Additional Social History:    Allergies:   Allergies  Allergen Reactions   Geodon [Ziprasidone Hcl] Other (See Comments)    Seizures   Valproic Acid    Levofloxacin Itching    Labs:  No results found for this or any previous visit (from the past 48 hour(s)).   Current Facility-Administered Medications  Medication Dose Route Frequency Provider Last Rate Last Admin   alum & mag hydroxide-simeth (MAALOX/MYLANTA) 200-200-20 MG/5ML suspension 30 mL  30 mL Oral Q6H PRN Sharman Cheek, MD       benztropine (COGENTIN) tablet 1 mg  1 mg Oral Daily Shevette Bess Y, NP       diphenhydrAMINE (BENADRYL) capsule 50 mg  50 mg Oral TID PRN Bennett, Christal H,  NP   50 mg at 05/28/23 2112   Or   diphenhydrAMINE (BENADRYL) injection 50 mg  50 mg Intramuscular TID PRN Bennett, Christal H, NP       haloperidol (HALDOL) tablet 5 mg  5 mg Oral BID Charm Rings, NP       ibuprofen (ADVIL) tablet 600 mg  600 mg Oral Q8H PRN Sharman Cheek, MD   600 mg at 05/29/23 0943   ondansetron (ZOFRAN) tablet 4 mg  4 mg Oral Q8H PRN Sharman Cheek, MD       No current outpatient medications on file.    Musculoskeletal: Strength & Muscle Tone: within normal limits Gait & Station: normal Patient leans: N/A  Psychiatric Specialty Exam: Physical Exam Vitals and nursing note reviewed.  Constitutional:      Appearance: Normal appearance.  HENT:     Head: Normocephalic.     Nose: Nose normal.  Pulmonary:     Effort: Pulmonary effort is normal.  Musculoskeletal:        General: Normal range of motion.     Cervical back: Normal range of motion.  Neurological:      General: No focal deficit present.     Mental Status: He is alert and oriented to person, place, and time.  Psychiatric:        Attention and Perception: Attention and perception normal.        Mood and Affect: Affect normal. Mood is anxious.        Speech: Speech normal.        Behavior: Behavior normal. Behavior is cooperative.        Thought Content: Thought content is paranoid.        Cognition and Memory: Cognition and memory normal.        Judgment: Judgment normal.     Review of Systems  Psychiatric/Behavioral:  The patient is nervous/anxious.   All other systems reviewed and are negative.   Blood pressure 117/71, pulse 60, temperature 98.1 F (36.7 C), temperature source Oral, resp. rate 16, height 5\' 9"  (1.753 m), weight 56 kg, SpO2 96 %.Body mass index is 18.23 kg/m.  General Appearance: Casual  Eye Contact:  Fair  Speech:  Normal Rate  Volume:  Normal  Mood:  Anxious  Affect:  Congruent  Thought Process:  Coherent  Orientation:  Full (Time, Place, and Person)  Thought Content:  Paranoia, minimal  Suicidal Thoughts:  No  Homicidal Thoughts:  No  Memory:  Immediate;   Fair Recent;   Fair Remote;   Fair  Judgement:  Poor  Insight:  Fair  Psychomotor Activity:  Normal  Concentration:  Concentration: Fair and Attention Span: Fair  Recall:  [employer] of Knowledge:  Fair  Language:  Good  Akathisia:  No  Handed:  Right  AIMS (if indicated):     Assets:  Leisure Time Physical Health Resilience  ADL's:  Intact  Cognition:  WNL  Sleep:         Physical Exam: Physical Exam Vitals and nursing note reviewed.  HENT:     Head: Normocephalic.     Nose: Nose normal.  Pulmonary:     Effort: Pulmonary effort is normal.  Musculoskeletal:        General: Normal range of motion.     Cervical back: Normal range of motion.  Neurological:     Mental Status: He is alert and oriented to person, place, and time.  Psychiatric:  Attention and Perception: He does  not perceive auditory or visual hallucinations.        Mood and Affect: Mood is anxious. Affect is tearful.        Speech: Speech normal.        Behavior: Behavior is cooperative.        Thought Content: Thought content is paranoid (Reports his children's mother put a hit out on him.).        Cognition and Memory: Cognition and memory normal.    ROS Blood pressure 117/71, pulse 60, temperature 98.1 F (36.7 C), temperature source Oral, resp. rate 16, height 5\' 9"  (1.753 m), weight 56 kg, SpO2 96 %. Body mass index is 18.23 kg/m.  Treatment Plan Summary: Methamphetamine induced psychosis with delusions: Zyprexa 10 mg BID started po or IM discontinued Haldol 5 mg BID started  EPS: Cogentin 1 mg daily started  Disposition:  Follow up with [facility], [facility], information provided  Nanine Means, NP 05/29/2023 10:40 AM

## 2023-05-29 NOTE — ED Notes (Signed)
Clearnce Hasten, NP at patients bedside speaking with patient

## 2023-08-25 ENCOUNTER — Other Ambulatory Visit (HOSPITAL_COMMUNITY): Payer: Self-pay
